# Patient Record
Sex: Female | Born: 1950 | Race: White | Hispanic: No | Smoking: Former smoker
Health system: Southern US, Community
[De-identification: ages and names within clinical notes are randomized; demographics above are authoritative.]

## PROBLEM LIST (undated history)

## (undated) DIAGNOSIS — M1990 Unspecified osteoarthritis, unspecified site: Secondary | ICD-10-CM

## (undated) DIAGNOSIS — J45909 Unspecified asthma, uncomplicated: Secondary | ICD-10-CM

## (undated) DIAGNOSIS — Z87442 Personal history of urinary calculi: Secondary | ICD-10-CM

## (undated) DIAGNOSIS — I700 Atherosclerosis of aorta: Secondary | ICD-10-CM

## (undated) DIAGNOSIS — J9601 Acute respiratory failure with hypoxia: Secondary | ICD-10-CM

## (undated) DIAGNOSIS — F32A Depression, unspecified: Secondary | ICD-10-CM

## (undated) DIAGNOSIS — J441 Chronic obstructive pulmonary disease with (acute) exacerbation: Secondary | ICD-10-CM

## (undated) DIAGNOSIS — F329 Major depressive disorder, single episode, unspecified: Secondary | ICD-10-CM

## (undated) DIAGNOSIS — C801 Malignant (primary) neoplasm, unspecified: Secondary | ICD-10-CM

## (undated) DIAGNOSIS — T1491XA Suicide attempt, initial encounter: Secondary | ICD-10-CM

## (undated) DIAGNOSIS — F419 Anxiety disorder, unspecified: Secondary | ICD-10-CM

## (undated) DIAGNOSIS — J449 Chronic obstructive pulmonary disease, unspecified: Secondary | ICD-10-CM

## (undated) HISTORY — PX: CATARACT EXTRACTION W/ INTRAOCULAR LENS  IMPLANT, BILATERAL: SHX1307

## (undated) HISTORY — PX: TUBAL LIGATION: SHX77

## (undated) HISTORY — PX: APPENDECTOMY: SHX54

## (undated) HISTORY — PX: DILATION AND CURETTAGE OF UTERUS: SHX78

## (undated) HISTORY — PX: TONSILLECTOMY: SUR1361

## (undated) HISTORY — PX: ABDOMINAL HYSTERECTOMY: SHX81

## (undated) HISTORY — PX: BREAST BIOPSY: SHX20

---

## 2008-07-07 ENCOUNTER — Other Ambulatory Visit: Admission: RE | Admit: 2008-07-07 | Discharge: 2008-07-07 | Payer: Self-pay | Admitting: Family Medicine

## 2008-09-29 ENCOUNTER — Encounter: Admission: RE | Admit: 2008-09-29 | Discharge: 2008-09-29 | Payer: Self-pay | Admitting: Family Medicine

## 2012-07-12 ENCOUNTER — Encounter (HOSPITAL_COMMUNITY): Payer: Self-pay | Admitting: Emergency Medicine

## 2012-07-12 ENCOUNTER — Emergency Department (HOSPITAL_COMMUNITY)
Admission: EM | Admit: 2012-07-12 | Discharge: 2012-07-12 | Disposition: A | Discharge status: Home / Self Care | Payer: BC Managed Care – PPO | Attending: Emergency Medicine | Admitting: Emergency Medicine

## 2012-07-12 ENCOUNTER — Emergency Department (HOSPITAL_COMMUNITY): Payer: BC Managed Care – PPO

## 2012-07-12 DIAGNOSIS — Z79899 Other long term (current) drug therapy: Secondary | ICD-10-CM | POA: Insufficient documentation

## 2012-07-12 DIAGNOSIS — R918 Other nonspecific abnormal finding of lung field: Secondary | ICD-10-CM

## 2012-07-12 DIAGNOSIS — N200 Calculus of kidney: Secondary | ICD-10-CM | POA: Insufficient documentation

## 2012-07-12 DIAGNOSIS — Z90710 Acquired absence of both cervix and uterus: Secondary | ICD-10-CM | POA: Insufficient documentation

## 2012-07-12 DIAGNOSIS — F17200 Nicotine dependence, unspecified, uncomplicated: Secondary | ICD-10-CM | POA: Insufficient documentation

## 2012-07-12 DIAGNOSIS — R911 Solitary pulmonary nodule: Secondary | ICD-10-CM | POA: Insufficient documentation

## 2012-07-12 DIAGNOSIS — R109 Unspecified abdominal pain: Secondary | ICD-10-CM | POA: Insufficient documentation

## 2012-07-12 DIAGNOSIS — N390 Urinary tract infection, site not specified: Secondary | ICD-10-CM | POA: Insufficient documentation

## 2012-07-12 DIAGNOSIS — Z7982 Long term (current) use of aspirin: Secondary | ICD-10-CM | POA: Insufficient documentation

## 2012-07-12 LAB — URINALYSIS, ROUTINE W REFLEX MICROSCOPIC
Bilirubin Urine: NEGATIVE
Glucose, UA: NEGATIVE mg/dL
Nitrite: NEGATIVE
Specific Gravity, Urine: 1.025 (ref 1.005–1.030)
pH: 5 (ref 5.0–8.0)

## 2012-07-12 LAB — URINE MICROSCOPIC-ADD ON

## 2012-07-12 LAB — POCT I-STAT, CHEM 8
Calcium, Ion: 1.21 mmol/L (ref 1.13–1.30)
Glucose, Bld: 80 mg/dL (ref 70–99)
HCT: 37 % (ref 36.0–46.0)
Hemoglobin: 12.6 g/dL (ref 12.0–15.0)
Potassium: 3.8 mEq/L (ref 3.5–5.1)

## 2012-07-12 MED ORDER — CEPHALEXIN 500 MG PO CAPS: 500.0000 mg | ORAL_CAPSULE | Freq: Four times a day (QID) | ORAL | Status: DC

## 2012-07-12 MED ORDER — KETOROLAC TROMETHAMINE 30 MG/ML IJ SOLN
30.0000 mg | Freq: Once | INTRAMUSCULAR | Status: AC
  Administered 2012-07-12: 30 mg via INTRAVENOUS
  Filled 2012-07-12: qty 1

## 2012-07-12 MED ORDER — SODIUM CHLORIDE 0.9 % IV BOLUS (SEPSIS)
1000.0000 mL | Freq: Once | INTRAVENOUS | Status: AC
  Administered 2012-07-12: 1000 mL via INTRAVENOUS

## 2012-07-12 MED ORDER — IBUPROFEN 600 MG PO TABS: 600.0000 mg | ORAL_TABLET | Freq: Four times a day (QID) | ORAL | Status: DC | PRN

## 2012-07-12 NOTE — ED Provider Notes (Addendum)
History     CSN: [id number]  Arrival date & time 07/12/12  1807   First MD Initiated Contact with Patient 07/12/12 1829      Chief Complaint  Patient presents with  . Flank Pain    (Consider location/radiation/quality/duration/timing/severity/associated sxs/prior treatment) HPI Patient with waxing and waning left flank pain for two days.  Worse today.  Pain at 5/10 now but has been 10/10.  No similar episodes in past.  No known history of trauma, dysuria, frequency, kidney stones.  No worsening or relieving factors.  Took aspirin without relief.    History reviewed. No pertinent past medical history.    Past Surgical History  Procedure Laterality Date  . Appendectomy    hysterectomy Tonsillectomy cataracts  History reviewed. No pertinent family history.  History  Substance Use Topics  . Smoking status: Current Every Day Smoker  . Smokeless tobacco: Not on file  . Alcohol Use: No    OB History   Grav Para Term Preterm Abortions TAB SAB Ect Mult Living                  Review of Systems  All other systems reviewed and are negative.    Allergies  Demerol and Oxycodone-acetaminophen  Home Medications   Current Outpatient Rx  Name  Route  Sig  Dispense  Refill  . aspirin 325 MG buffered tablet   Oral   Take 325 mg by mouth daily.         . Cranberry 250 MG TABS   Oral   Take 2 tablets by mouth daily.           BP 128/67  Pulse 99  Temp(Src) 98 F (36.7 C) (Oral)  Resp 16  SpO2 97%  Physical Exam  Nursing note and vitals reviewed. Constitutional: She appears well-developed and well-nourished.  HENT:  Head: Normocephalic and atraumatic.  Eyes: Conjunctivae and EOM are normal. Pupils are equal, round, and reactive to light.  Neck: Normal range of motion. Neck supple.  Cardiovascular: Normal rate, regular rhythm, normal heart sounds and intact distal pulses.   Pulmonary/Chest: Effort normal and breath sounds normal.  Abdominal: Soft. Bowel  sounds are normal.  Musculoskeletal: Normal range of motion.  Neurological: She is alert.  Skin: Skin is warm and dry.  Psychiatric: She has a normal mood and affect. Thought content normal.    ED Course  Procedures (including critical care time)  Labs Reviewed  URINALYSIS, ROUTINE W REFLEX MICROSCOPIC   No results found.   No diagnosis found.    MDM    62 year old female with left flank pain. Results for orders placed during the hospital encounter of 07/12/12  URINALYSIS, ROUTINE W REFLEX MICROSCOPIC      Result Value Range   Color, Urine YELLOW  YELLOW   APPearance CLEAR  CLEAR   Specific Gravity, Urine 1.025  1.005 - 1.030   pH 5.0  5.0 - 8.0   Glucose, UA NEGATIVE  NEGATIVE mg/dL   Hgb urine dipstick NEGATIVE  NEGATIVE   Bilirubin Urine NEGATIVE  NEGATIVE   Ketones, ur NEGATIVE  NEGATIVE mg/dL   Protein, ur NEGATIVE  NEGATIVE mg/dL   Urobilinogen, UA 1.0  0.0 - 1.0 mg/dL   Nitrite NEGATIVE  NEGATIVE   Leukocytes, UA MODERATE (*) NEGATIVE  URINE MICROSCOPIC-ADD ON      Result Value Range   Squamous Epithelial / LPF RARE  RARE   WBC, UA 3-6  <3 WBC/hpf   Bacteria, UA RARE  RARE   Urine-Other MUCOUS PRESENT    POCT I-STAT, CHEM 8      Result Value Range   Sodium 144  135 - 145 mEq/L   Potassium 3.8  3.5 - 5.1 mEq/L   Chloride 109  96 - 112 mEq/L   BUN 22  6 - 23 mg/dL   Creatinine, Ser 4.09  0.50 - 1.10 mg/dL   Glucose, Bld 80  70 - 99 mg/dL   Calcium, Ion 8.11  9.14 - 1.30 mmol/L   TCO2 28  0 - 100 mmol/L   Hemoglobin 12.6  12.0 - 15.0 g/dL   HCT 78.2  95.6 - 21.3 %   Ct Abdomen Pelvis Wo Contrast  07/12/2012  *RADIOLOGY REPORT*  Clinical Data:  Left-sided flank pain for 3 days.  Prior appendectomy.  CT ABDOMEN AND PELVIS WITHOUT CONTRAST (CT UROGRAM)  Technique: Contiguous axial images of the abdomen and pelvis without oral or intravenous contrast were obtained.  Comparison: None  Findings:  Exam is limited for evaluation of entities other than urinary  tract calculi due to lack of oral or intravenous contrast.   Lung bases:  Mild centrilobular emphysema at the lung bases.  A subpleural 3 mm left lower lobe lung nodule on image 5/series 6. Pleural-based irregularity more medially on image 2/series 6 could represent an area of pleural thickening.  Normal heart size without pericardial or pleural effusion.  Abdomen/pelvis:  Low density left liver lobe focus on image 18/series 2 measures 6 mm and is likely a cyst.  Normal uninfused appearance of the spleen, stomach, pancreas, gallbladder, biliary tract, adrenal glands.  Normal right kidney.  3 mm upper pole left renal collecting system calculus.  No urinary tract obstruction.  Phlebolith left hemi pelvis, without hydroureter or ureteric stone.  Advanced aortic atherosclerosis. No retroperitoneal or retrocrural adenopathy.  The cecum is positioned within the mid right pelvis. Normal terminal ileum.  Appendix surgically absent. Normal small bowel without abdominal ascites.  No retroperitoneal or retrocrural adenopathy.  Normal urinary bladder.  Hysterectomy.  No adnexal mass.  No significant free fluid.  Bones/Musculoskeletal:  Mild osteopenia.  IMPRESSION:  1.  Left renal calculus, without urinary tract obstruction. 2.  No other explanation for left-sided pain. 3.  Centrilobular emphysema with at least one tiny left lung base nodule. Given risk factors for bronchogenic carcinoma, follow-up chest CT at 1 year is recommended.  This recommendation follows the consensus statement:  "Guidelines for Management of Small Pulmonary Nodules Detected on CT Scans:  A Statement from the Fleischner Society" as published in Radiology 2005; 237:395-400.  Available online at:  [email or web address].cz.   Original Report Authenticated By: Jeronimo Greaves, M.D.     62 year old female with flank pain for 2 days which has been waxing and waning in nature. She has left CVA tenderness on examination with no  abdominal pain. CT reveals 3 mm upper pole left renal collecting system calculus but no ureteral stone or calculus. Patient feels improved after Toradol. This may represent either a stone that was not seen and has passed or pain from UTI. She has moderate LE and 3-6 white blood cells on her urine. Patient advised regarding flank and abdominal pain to return if is not better in 1224 hours or is worse anytime. She'll be given Percocet for pain and Keflex for urinary tract infection.  Patient advised regarding left lung base nodule. Given her risk factors for bronchogenic carcinoma followup chest CT at 1 year is recommended and she is so  advised.     Hilario Quarry, MD 07/12/12 1958  Addendum patient has intolerance to narcotics and will be given/advised to take ibuprofen instead.  Hilario Quarry, MD 07/12/12 2000

## 2012-07-12 NOTE — ED Notes (Signed)
Pt states she has had L flank pain for past 3 days.  Denies any n/v/d or changes with urination.  No hx of kidney stones.

## 2016-02-18 ENCOUNTER — Other Ambulatory Visit: Payer: Self-pay | Admitting: Family Medicine

## 2016-02-18 DIAGNOSIS — Z1231 Encounter for screening mammogram for malignant neoplasm of breast: Secondary | ICD-10-CM

## 2016-02-18 DIAGNOSIS — E2839 Other primary ovarian failure: Secondary | ICD-10-CM

## 2016-03-02 ENCOUNTER — Ambulatory Visit
Admission: RE | Admit: 2016-03-02 | Discharge: 2016-03-02 | Disposition: A | Discharge status: Home / Self Care | Payer: Medicare Other | Source: Ambulatory Visit | Attending: Family Medicine | Admitting: Family Medicine

## 2016-03-02 ENCOUNTER — Other Ambulatory Visit: Payer: Self-pay | Admitting: Family Medicine

## 2016-03-02 DIAGNOSIS — Z1231 Encounter for screening mammogram for malignant neoplasm of breast: Secondary | ICD-10-CM

## 2016-03-02 DIAGNOSIS — E2839 Other primary ovarian failure: Secondary | ICD-10-CM

## 2016-09-13 DIAGNOSIS — R079 Chest pain, unspecified: Secondary | ICD-10-CM | POA: Diagnosis not present

## 2017-02-02 DIAGNOSIS — Z23 Encounter for immunization: Secondary | ICD-10-CM | POA: Diagnosis not present

## 2017-06-06 DIAGNOSIS — E559 Vitamin D deficiency, unspecified: Secondary | ICD-10-CM | POA: Diagnosis not present

## 2017-06-06 DIAGNOSIS — E7800 Pure hypercholesterolemia, unspecified: Secondary | ICD-10-CM | POA: Diagnosis not present

## 2017-06-06 DIAGNOSIS — Z7729 Contact with and (suspected ) exposure to other hazardous substances: Secondary | ICD-10-CM | POA: Diagnosis not present

## 2017-06-06 DIAGNOSIS — Z23 Encounter for immunization: Secondary | ICD-10-CM | POA: Diagnosis not present

## 2017-06-06 DIAGNOSIS — Z131 Encounter for screening for diabetes mellitus: Secondary | ICD-10-CM | POA: Diagnosis not present

## 2017-06-06 DIAGNOSIS — Z1211 Encounter for screening for malignant neoplasm of colon: Secondary | ICD-10-CM | POA: Diagnosis not present

## 2017-06-06 DIAGNOSIS — Z1231 Encounter for screening mammogram for malignant neoplasm of breast: Secondary | ICD-10-CM | POA: Diagnosis not present

## 2017-06-06 DIAGNOSIS — Z1159 Encounter for screening for other viral diseases: Secondary | ICD-10-CM | POA: Diagnosis not present

## 2017-06-06 DIAGNOSIS — Z0000 Encounter for general adult medical examination without abnormal findings: Secondary | ICD-10-CM | POA: Diagnosis not present

## 2017-06-29 ENCOUNTER — Other Ambulatory Visit: Payer: Self-pay | Admitting: Family Medicine

## 2017-06-29 DIAGNOSIS — Z1231 Encounter for screening mammogram for malignant neoplasm of breast: Secondary | ICD-10-CM

## 2017-07-13 DIAGNOSIS — B079 Viral wart, unspecified: Secondary | ICD-10-CM | POA: Diagnosis not present

## 2017-07-13 DIAGNOSIS — M79671 Pain in right foot: Secondary | ICD-10-CM | POA: Diagnosis not present

## 2017-07-13 DIAGNOSIS — M79672 Pain in left foot: Secondary | ICD-10-CM | POA: Diagnosis not present

## 2017-07-13 DIAGNOSIS — M21622 Bunionette of left foot: Secondary | ICD-10-CM | POA: Diagnosis not present

## 2017-07-13 DIAGNOSIS — M21621 Bunionette of right foot: Secondary | ICD-10-CM | POA: Diagnosis not present

## 2017-07-20 ENCOUNTER — Ambulatory Visit
Admission: RE | Admit: 2017-07-20 | Discharge: 2017-07-20 | Disposition: A | Discharge status: Home / Self Care | Payer: Medicare HMO | Source: Ambulatory Visit | Attending: Family Medicine | Admitting: Family Medicine

## 2017-07-20 DIAGNOSIS — Z1231 Encounter for screening mammogram for malignant neoplasm of breast: Secondary | ICD-10-CM | POA: Diagnosis not present

## 2017-07-27 DIAGNOSIS — M79672 Pain in left foot: Secondary | ICD-10-CM | POA: Diagnosis not present

## 2017-07-27 DIAGNOSIS — M79671 Pain in right foot: Secondary | ICD-10-CM | POA: Diagnosis not present

## 2017-07-27 DIAGNOSIS — B079 Viral wart, unspecified: Secondary | ICD-10-CM | POA: Diagnosis not present

## 2017-07-27 DIAGNOSIS — L600 Ingrowing nail: Secondary | ICD-10-CM | POA: Diagnosis not present

## 2017-08-10 DIAGNOSIS — M79671 Pain in right foot: Secondary | ICD-10-CM | POA: Diagnosis not present

## 2017-08-10 DIAGNOSIS — M79672 Pain in left foot: Secondary | ICD-10-CM | POA: Diagnosis not present

## 2017-08-10 DIAGNOSIS — B351 Tinea unguium: Secondary | ICD-10-CM | POA: Diagnosis not present

## 2017-09-07 DIAGNOSIS — M79674 Pain in right toe(s): Secondary | ICD-10-CM | POA: Diagnosis not present

## 2017-09-07 DIAGNOSIS — M79675 Pain in left toe(s): Secondary | ICD-10-CM | POA: Diagnosis not present

## 2017-09-14 DIAGNOSIS — J208 Acute bronchitis due to other specified organisms: Secondary | ICD-10-CM | POA: Diagnosis not present

## 2017-09-14 DIAGNOSIS — R05 Cough: Secondary | ICD-10-CM | POA: Diagnosis not present

## 2017-09-15 ENCOUNTER — Other Ambulatory Visit: Payer: Self-pay

## 2017-09-15 ENCOUNTER — Emergency Department (HOSPITAL_COMMUNITY): Payer: Medicare HMO

## 2017-09-15 ENCOUNTER — Encounter (HOSPITAL_COMMUNITY): Payer: Self-pay | Admitting: *Deleted

## 2017-09-15 ENCOUNTER — Observation Stay (HOSPITAL_COMMUNITY)
Admission: EM | Admit: 2017-09-15 | Discharge: 2017-09-16 | Disposition: A | Discharge status: Home / Self Care | Payer: Medicare HMO | Attending: Internal Medicine | Admitting: Internal Medicine

## 2017-09-15 DIAGNOSIS — Z8249 Family history of ischemic heart disease and other diseases of the circulatory system: Secondary | ICD-10-CM | POA: Diagnosis not present

## 2017-09-15 DIAGNOSIS — Z885 Allergy status to narcotic agent status: Secondary | ICD-10-CM | POA: Insufficient documentation

## 2017-09-15 DIAGNOSIS — J4489 Other specified chronic obstructive pulmonary disease: Secondary | ICD-10-CM | POA: Diagnosis present

## 2017-09-15 DIAGNOSIS — Z79899 Other long term (current) drug therapy: Secondary | ICD-10-CM | POA: Insufficient documentation

## 2017-09-15 DIAGNOSIS — Z90710 Acquired absence of both cervix and uterus: Secondary | ICD-10-CM | POA: Diagnosis not present

## 2017-09-15 DIAGNOSIS — J449 Chronic obstructive pulmonary disease, unspecified: Secondary | ICD-10-CM | POA: Diagnosis present

## 2017-09-15 DIAGNOSIS — Z803 Family history of malignant neoplasm of breast: Secondary | ICD-10-CM | POA: Diagnosis not present

## 2017-09-15 DIAGNOSIS — Z87891 Personal history of nicotine dependence: Secondary | ICD-10-CM | POA: Insufficient documentation

## 2017-09-15 DIAGNOSIS — R791 Abnormal coagulation profile: Secondary | ICD-10-CM | POA: Diagnosis not present

## 2017-09-15 DIAGNOSIS — D72829 Elevated white blood cell count, unspecified: Secondary | ICD-10-CM | POA: Insufficient documentation

## 2017-09-15 DIAGNOSIS — I700 Atherosclerosis of aorta: Secondary | ICD-10-CM | POA: Insufficient documentation

## 2017-09-15 DIAGNOSIS — Z820 Family history of epilepsy and other diseases of the nervous system: Secondary | ICD-10-CM | POA: Diagnosis not present

## 2017-09-15 DIAGNOSIS — F319 Bipolar disorder, unspecified: Secondary | ICD-10-CM | POA: Insufficient documentation

## 2017-09-15 DIAGNOSIS — E8889 Other specified metabolic disorders: Secondary | ICD-10-CM | POA: Insufficient documentation

## 2017-09-15 DIAGNOSIS — J441 Chronic obstructive pulmonary disease with (acute) exacerbation: Secondary | ICD-10-CM | POA: Diagnosis not present

## 2017-09-15 DIAGNOSIS — J189 Pneumonia, unspecified organism: Secondary | ICD-10-CM

## 2017-09-15 DIAGNOSIS — R0602 Shortness of breath: Secondary | ICD-10-CM | POA: Diagnosis not present

## 2017-09-15 HISTORY — DX: Chronic obstructive pulmonary disease, unspecified: J44.9

## 2017-09-15 HISTORY — DX: Other specified chronic obstructive pulmonary disease: J44.89

## 2017-09-15 LAB — COMPREHENSIVE METABOLIC PANEL
ALT: 27 U/L (ref 14–54)
AST: 27 U/L (ref 15–41)
Albumin: 4.4 g/dL (ref 3.5–5.0)
Alkaline Phosphatase: 132 U/L — ABNORMAL HIGH (ref 38–126)
Anion gap: 11 (ref 5–15)
BILIRUBIN TOTAL: 0.4 mg/dL (ref 0.3–1.2)
BUN: 16 mg/dL (ref 6–20)
CHLORIDE: 106 mmol/L (ref 101–111)
CO2: 25 mmol/L (ref 22–32)
CREATININE: 0.68 mg/dL (ref 0.44–1.00)
Calcium: 9 mg/dL (ref 8.9–10.3)
Glucose, Bld: 100 mg/dL — ABNORMAL HIGH (ref 65–99)
POTASSIUM: 4.1 mmol/L (ref 3.5–5.1)
Sodium: 142 mmol/L (ref 135–145)
Total Protein: 8.1 g/dL (ref 6.5–8.1)

## 2017-09-15 LAB — CBC WITH DIFFERENTIAL/PLATELET
BASOS ABS: 0.1 10*3/uL (ref 0.0–0.1)
Basophils Relative: 1 %
Eosinophils Absolute: 1.1 10*3/uL — ABNORMAL HIGH (ref 0.0–0.7)
Eosinophils Relative: 7 %
HEMATOCRIT: 43.3 % (ref 36.0–46.0)
Hemoglobin: 14.4 g/dL (ref 12.0–15.0)
LYMPHS ABS: 3 10*3/uL (ref 0.7–4.0)
LYMPHS PCT: 18 %
MCH: 30.6 pg (ref 26.0–34.0)
MCHC: 33.3 g/dL (ref 30.0–36.0)
MCV: 92.1 fL (ref 78.0–100.0)
MONO ABS: 0.8 10*3/uL (ref 0.1–1.0)
MONOS PCT: 5 %
Neutro Abs: 11.3 10*3/uL — ABNORMAL HIGH (ref 1.7–7.7)
Neutrophils Relative %: 69 %
PLATELETS: 297 10*3/uL (ref 150–400)
RBC: 4.7 MIL/uL (ref 3.87–5.11)
RDW: 13.1 % (ref 11.5–15.5)
WBC: 16.3 10*3/uL — ABNORMAL HIGH (ref 4.0–10.5)

## 2017-09-15 LAB — TROPONIN I

## 2017-09-15 LAB — D-DIMER, QUANTITATIVE (NOT AT ARMC): D DIMER QUANT: 0.89 ug{FEU}/mL — AB (ref 0.00–0.50)

## 2017-09-15 MED ORDER — POLYETHYLENE GLYCOL 3350 17 G PO PACK: 17.0000 g | PACK | Freq: Every day | ORAL | Status: DC | PRN

## 2017-09-15 MED ORDER — IPRATROPIUM-ALBUTEROL 0.5-2.5 (3) MG/3ML IN SOLN
3.0000 mL | Freq: Four times a day (QID) | RESPIRATORY_TRACT | Status: DC
  Administered 2017-09-15: 3 mL via RESPIRATORY_TRACT
  Filled 2017-09-15: qty 3

## 2017-09-15 MED ORDER — AZITHROMYCIN 250 MG PO TABS
500.0000 mg | ORAL_TABLET | Freq: Once | ORAL | Status: AC
  Administered 2017-09-15: 500 mg via ORAL
  Filled 2017-09-15: qty 2

## 2017-09-15 MED ORDER — ALBUTEROL SULFATE (2.5 MG/3ML) 0.083% IN NEBU
5.0000 mg | INHALATION_SOLUTION | Freq: Once | RESPIRATORY_TRACT | Status: AC
  Administered 2017-09-15: 5 mg via RESPIRATORY_TRACT
  Filled 2017-09-15: qty 6

## 2017-09-15 MED ORDER — ARFORMOTEROL TARTRATE 15 MCG/2ML IN NEBU
15.0000 ug | INHALATION_SOLUTION | Freq: Two times a day (BID) | RESPIRATORY_TRACT | Status: DC
  Administered 2017-09-15 – 2017-09-16 (×2): 15 ug via RESPIRATORY_TRACT
  Filled 2017-09-15 (×3): qty 2

## 2017-09-15 MED ORDER — VITAMIN D 1000 UNITS PO TABS
2000.0000 [IU] | ORAL_TABLET | Freq: Every day | ORAL | Status: DC
  Administered 2017-09-16: 2000 [IU] via ORAL
  Filled 2017-09-15: qty 2

## 2017-09-15 MED ORDER — ENOXAPARIN SODIUM 40 MG/0.4ML ~~LOC~~ SOLN
40.0000 mg | SUBCUTANEOUS | Status: DC
  Administered 2017-09-15: 40 mg via SUBCUTANEOUS
  Filled 2017-09-15: qty 0.4

## 2017-09-15 MED ORDER — ALENDRONATE SODIUM 70 MG PO TABS: 70.0000 mg | ORAL_TABLET | ORAL | Status: DC

## 2017-09-15 MED ORDER — ALBUTEROL (5 MG/ML) CONTINUOUS INHALATION SOLN
10.0000 mg/h | INHALATION_SOLUTION | RESPIRATORY_TRACT | Status: DC
  Administered 2017-09-15: 10 mg/h via RESPIRATORY_TRACT
  Filled 2017-09-15: qty 20

## 2017-09-15 MED ORDER — ALBUTEROL SULFATE (2.5 MG/3ML) 0.083% IN NEBU
2.5000 mg | INHALATION_SOLUTION | Freq: Once | RESPIRATORY_TRACT | Status: AC
  Administered 2017-09-15: 2.5 mg via RESPIRATORY_TRACT
  Filled 2017-09-15: qty 3

## 2017-09-15 MED ORDER — IPRATROPIUM-ALBUTEROL 0.5-2.5 (3) MG/3ML IN SOLN
3.0000 mL | RESPIRATORY_TRACT | Status: DC
  Filled 2017-09-15: qty 6

## 2017-09-15 MED ORDER — PREDNISONE 20 MG PO TABS
60.0000 mg | ORAL_TABLET | Freq: Every day | ORAL | Status: DC
  Administered 2017-09-15 – 2017-09-16 (×2): 60 mg via ORAL
  Filled 2017-09-15 (×2): qty 3

## 2017-09-15 MED ORDER — ACETAMINOPHEN 325 MG PO TABS
650.0000 mg | ORAL_TABLET | Freq: Four times a day (QID) | ORAL | Status: DC | PRN
  Administered 2017-09-15: 650 mg via ORAL
  Filled 2017-09-15: qty 2

## 2017-09-15 MED ORDER — BUPROPION HCL ER (SR) 100 MG PO TB12
100.0000 mg | ORAL_TABLET | Freq: Every day | ORAL | Status: DC
  Administered 2017-09-16: 100 mg via ORAL
  Filled 2017-09-15: qty 1

## 2017-09-15 MED ORDER — IOPAMIDOL (ISOVUE-370) INJECTION 76%
100.0000 mL | Freq: Once | INTRAVENOUS | Status: AC | PRN
  Administered 2017-09-15: 100 mL via INTRAVENOUS

## 2017-09-15 MED ORDER — IPRATROPIUM BROMIDE 0.02 % IN SOLN
0.5000 mg | Freq: Once | RESPIRATORY_TRACT | Status: AC
  Administered 2017-09-15: 0.5 mg via RESPIRATORY_TRACT
  Filled 2017-09-15: qty 2.5

## 2017-09-15 MED ORDER — PREDNISONE 20 MG PO TABS
60.0000 mg | ORAL_TABLET | Freq: Once | ORAL | Status: AC
  Administered 2017-09-15: 60 mg via ORAL
  Filled 2017-09-15: qty 3

## 2017-09-15 MED ORDER — TIOTROPIUM BROMIDE MONOHYDRATE 18 MCG IN CAPS
18.0000 ug | ORAL_CAPSULE | Freq: Every day | RESPIRATORY_TRACT | Status: DC
  Filled 2017-09-15: qty 5

## 2017-09-15 MED ORDER — IOPAMIDOL (ISOVUE-370) INJECTION 76%
INTRAVENOUS | Status: AC
  Filled 2017-09-15: qty 100

## 2017-09-15 MED ORDER — GUAIFENESIN ER 600 MG PO TB12
600.0000 mg | ORAL_TABLET | Freq: Two times a day (BID) | ORAL | Status: DC | PRN
  Administered 2017-09-15: 600 mg via ORAL
  Filled 2017-09-15: qty 1

## 2017-09-15 MED ORDER — AZITHROMYCIN 250 MG PO TABS
500.0000 mg | ORAL_TABLET | Freq: Every day | ORAL | Status: DC
  Administered 2017-09-16: 500 mg via ORAL
  Filled 2017-09-15: qty 2

## 2017-09-15 NOTE — H&P (Signed)
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 5/10 she went to sleep but woke up at 2 AM this morning and could not sleep until 8 PM because she was having continued productive cough  She does not usually use her inhalers only uses them as needed however has continuously been needing them for the past couple of days She has not been hospitalized in 40 years   ED Course: Patient was given large dose of albuterol 15 mg, ipratropium and work-up was performed  Review of Systems:   Negative for fever, visual changes, sore throat, rash, new muscle aches, chest pain, SOB, dysuria, bleeding, n/v/abdominal pain. - Bleeding, - chest pain, - melena, - dysuria, - rash  + + Jittery and tremulous  Past Medical History:  Diagnosis Date  . COPD (chronic obstructive pulmonary disease) (HCC)     Past Surgical History:  Procedure Laterality Date  . ABDOMINAL HYSTERECTOMY    . APPENDECTOMY    . BREAST BIOPSY    . BREAST EXCISIONAL BIOPSY    . TONSILLECTOMY       reports that she has quit smoking. She has never used smokeless tobacco. She reports that she does not drink alcohol or use drugs. Mobility: At baseline is independent Mother had cardiomyopathy and died from this Father had dementia She currently lives independently     Allergies  Allergen Reactions  . Demerol [Meperidine]     horrible reaction  . Oxycodone-Acetaminophen Other (See  Comments)    Upset stomach     Family History  Problem Relation Age of Onset  . Breast cancer Mother      Prior to Admission medications   Medication Sig Start Date End Date Taking? Authorizing Provider  alendronate (FOSAMAX) 70 MG tablet Take 70 mg by mouth once a week. On Wednesdays 07/11/17  Yes [provider]  buPROPion (WELLBUTRIN SR) 100 MG 12 hr tablet Take 100 mg by mouth daily.   Yes [provider]  Cholecalciferol (VITAMIN D) 2000 units tablet Take 2,000 Units by mouth daily.   Yes [provider]  guaiFENesin (MUCINEX) 600 MG 12 hr tablet Take 600 mg by mouth 2 (two) times daily as needed for cough.   Yes [provider]  naproxen sodium (ALEVE) 220 MG tablet Take 220 mg by mouth 2 (two) times daily as needed (pain).   Yes [provider]    Physical Exam:  Vitals:   09/15/17 1230 09/15/17 1300  BP: 118/66 116/68  Pulse: (!) 104 (!) 102  Resp: 18 (!) 21  Temp:    SpO2: 93% 98%     EOMI NCAT tremulous no lymphadenopathy no thyromegaly arcus senilis is present no icterus no pallor no JVD  Chest sounds are diminished posterolaterally however no active wheeze  S1-S2 no murmur rub or gallop sinus tachycardia on EKG and monitors  Abdomen is soft nontender nondistended no rebound or guarding  No lower extremity edema  No rash  Euthymic and congruent    Musculoskeletal intact  Extraocular movements intact power is 5/5 reflexes 2/3  Muscle bulk is intact   I have personally reviewed following labs and imaging studies  Labs:   White count 16.7, alk phos 132 glucose 100 rest of labs WNL   Imaging studies:   CXR + CT scan-CT scan shows inspissated mucus, no overt pneumonia however  Medical tests:   EKG independently reviewed: Sinus tachycardia PR interval 0.08 QTC 453 QRS axis 80  Test discussed with performing physician:  Gust in detail with Dr. Mesner plan of care  Decision to obtain old records:    None  Review and summation of old records:   No old records  Active Problems:   * No active hospital problems. *   Assessment/Plan COPD exacerbation-we will admit under the COPD guidelines and continue albuterol and ipratropium every 6 hourly In addition add Spiriva and Brovana Will give steroids with prednisone 60 mg and reassess in a.m. She will need prescriptions for all of the above medications and will need PCP follow-up  Bipolar-continue Wellbutrin 100 daily  Metabolic bone disease can continue her usual alendronate should be also on cholecalciferol and need this checked-refer to guidelines for bisphosphonate use  Nicotine habituation I counseled the patient she is pre-contemplative/contemplative but does not want to have a nicotine patch     Severity of Illness: The appropriate patient status for this patient is OBSERVATION. Observation status is judged to be reasonable and necessary in order to provide the required intensity of service to ensure the patient's safety. The patient's presenting symptoms, physical exam findings, and initial radiographic and laboratory data in the context of their medical condition is felt to place them at decreased risk for further clinical deterioration. Furthermore, it is anticipated that the patient will be medically stable for discharge from the hospital within 2 midnights of admission. The following factors support the patient status of observation.   " The patient's presenting symptoms include shortness of breath. " The physical exam findings include tachypnea jitteriness. " The initial radiographic and laboratory data are for rapid and speedy cover.      DVT prophylaxis: Lovenox Code Status: Full CODE STATUS Family Communication: Son is present at the bedside Consults called: None yet  Time spent: 45 minutes  , MD  [facility] Direct contact: [phone] --Via amion app OR  --[email or web address]; password [email or web address]   7PM-7AM contact night coverage as above  09/15/2017, 1:26 PM       

## 2017-09-15 NOTE — ED Triage Notes (Signed)
Tuesday SHOB with cold symptoms started, went to MD yesterday was told it is bronchitis a breathing treatment in office helped. Stated it was a virus. Pt given Medrol pack and Tessalon pearl Prescription which has not been filled.

## 2017-09-15 NOTE — ED Provider Notes (Signed)
Emergency Department Provider Note   I have reviewed the triage vital signs and the nursing notes.   HISTORY  Chief Complaint Shortness of Breath   HPI Lori Butler is a 67 y.o. female previous smoker with a history of COPD the presents to the emergency department today secondary to shortness of breath.  Patient has been having shortness of breath for a few days been progressively worsening she went saw her primary doctor yesterday where she was given a breathing treatment which seemed to help.  They sent her home with a prescription for Tessalon and steroids however she did not get that filled because she felt too sick to go to the pharmacy.  She woke up this morning in the symptoms were worse than they were yesterday so she comes here for evaluation.  She has had a productive cough but is just been clear sputum.  She also has not had any fevers or chest pain.  No leg swelling is worse than normal.  No recent surgeries or long car rides.  No nausea, vomiting, diaphoresis.  Never really been sick like this before. No other associated or modifying symptoms.    Past Medical History:  Diagnosis Date  . COPD (chronic obstructive pulmonary disease) [facility])     Patient Active Problem List   Diagnosis Date Noted  . COPD (chronic obstructive pulmonary disease) ([place]) 09/15/2017    Past Surgical History:  Procedure Laterality Date  . ABDOMINAL HYSTERECTOMY    . APPENDECTOMY    . BREAST BIOPSY    . BREAST EXCISIONAL BIOPSY    . TONSILLECTOMY      Current Outpatient Rx  . Order #: [id number] Class: Historical Med  . Order #: [id number] Class: Historical Med  . Order #: [id number] Class: Historical Med  . Order #: [id number] Class: Historical Med  . Order #: [id number] Class: Historical Med    Allergies Demerol [meperidine] and Oxycodone-acetaminophen  Family History  Problem Relation Age of Onset  . Breast cancer Mother     Social History Social History   Tobacco Use  . Smoking  status: Former Research scientist (life sciences)  . Smokeless tobacco: Never Used  Substance Use Topics  . Alcohol use: No  . Drug use: No    Review of Systems  All other systems negative except as documented in the HPI. All pertinent positives and negatives as reviewed in the HPI. ____________________________________________   PHYSICAL EXAM:  VITAL SIGNS: ED Triage Vitals  Enc Vitals Group     BP 09/15/17 0851 136/77     Pulse Rate 09/15/17 0851 (!) 102     Resp 09/15/17 0851 20     Temp 09/15/17 0851 97.6 F (36.4 C)     Temp Source 09/15/17 0851 Oral     SpO2 09/15/17 0851 93 %     Weight 09/15/17 0853 147 lb (66.7 kg)     Height 09/15/17 0853 5\' 5"  (1.651 m)     Head Circumference --      Peak Flow --      Pain Score 09/15/17 0852 0     Pain Loc --      Pain Edu? --      Excl. in [place]? --     Constitutional: Alert and oriented. Well appearing and in no acute distress. Eyes: Conjunctivae are normal. PERRL. EOMI. Head: Atraumatic. Nose: No congestion/rhinnorhea. Mouth/Throat: Mucous membranes are moist.  Oropharynx non-erythematous. Neck: No stridor.  No meningeal signs.   Cardiovascular: Normal rate, regular rhythm. Good peripheral circulation. Grossly normal heart  sounds.   Respiratory: tachypneic respiratory effort.  No retractions. Lungs diminished bilaterally with wheezing. Gastrointestinal: Soft and nontender. No distention.  usculoskeletal: No lower extremity tenderness nor edema. No gross deformities of extremities. Neurologic:  Normal speech and language. No gross focal neurologic deficits are appreciated.  Skin:  Skin is warm, dry and intact. No rash noted.   ____________________________________________   LABS (all labs ordered are listed, but only abnormal results are displayed)  Labs Reviewed  CBC WITH DIFFERENTIAL/PLATELET - Abnormal; Notable for the following components:      Result Value   WBC 16.3 (*)    Neutro Abs 11.3 (*)    Eosinophils Absolute 1.1 (*)    All other  components within normal limits  COMPREHENSIVE METABOLIC PANEL - Abnormal; Notable for the following components:   Glucose, Bld 100 (*)    Alkaline Phosphatase 132 (*)    All other components within normal limits  D-DIMER, QUANTITATIVE (NOT AT [facility]) - Abnormal; Notable for the following components:   D-Dimer, Quant 0.89 (*)    All other components within normal limits  TROPONIN I   ____________________________________________  EKG   EKG Interpretation  Date/Time:  Saturday Sep 15 2017 09:50:24 EDT Ventricular Rate:  103 PR Interval:    QRS Duration: 79 QT Interval:  346 QTC Calculation: 453 R Axis:   84 Text Interpretation:  Sinus tachycardia Right atrial enlargement Borderline right axis deviation Anteroseptal infarct, old No old tracing to compare Confirmed by Merrily Pew [phone]) on 09/15/2017 12:12:28 PM      ____________________________________________  RADIOLOGY  Dg Chest 2 View  Result Date: 09/15/2017 CLINICAL DATA:  Shortness of breath. Report sent episode of bronchitis. History of COPD. Former smoker. EXAM: CHEST - 2 VIEW COMPARISON:  09/16/2015; chest CT-earlier same day FINDINGS: Grossly unchanged cardiac silhouette and mediastinal contours. The lungs remain hyperexpanded with flattening of the diaphragms. Grossly symmetric biapical pleuroparenchymal thickening, unchanged. No new focal airspace opacities. No pleural effusion or pneumothorax. No evidence of edema. No acute osseus abnormalities IMPRESSION: Similar findings of lung hyperexpansion without superimposed acute cardiopulmonary disease. Electronically Signed   By: Sandi Mariscal M.D.   On: 09/15/2017 11:56   Ct Angio Chest Pe W And/or Wo Contrast  Result Date: 09/15/2017 CLINICAL DATA:  Shortness of breath and elevated D-dimer. EXAM: CT ANGIOGRAPHY CHEST WITH CONTRAST TECHNIQUE: Multidetector CT imaging of the chest was performed using the standard protocol during bolus administration of intravenous contrast.  Multiplanar CT image reconstructions and MIPs were obtained to evaluate the vascular anatomy. CONTRAST:  164mL ISOVUE-370 IOPAMIDOL (ISOVUE-370) INJECTION 76% COMPARISON:  Chest x-ray 09/15/2017. FINDINGS: Cardiovascular: Heart size is normal. No pericardial effusion. Pulmonary arteries are well opacified. There is no acute pulmonary embolus. Thoracic aorta is atherosclerotic. No associated aneurysm. Mediastinum/Nodes: The visualized portion of the thyroid gland has a normal appearance. Esophagus is normal in appearance. No significant mediastinal, hilar, or axillary adenopathy. Lungs/Pleura: There is moderate emphysema involving the lung apices. There are biapical pleuroparenchymal changes. There is inspissated mucus in the LEFT LOWER lobe bronchi. No suspicious pulmonary mass. No evidence for pulmonary edema. No consolidations or pleural effusions. Upper Abdomen: There is a 12 millimeter nodule in the LEFT hepatic lobe, stable since prior studies. Musculoskeletal: There is mild midthoracic degenerative change. Visualized osseous structures have a normal appearance. Review of the MIP images confirms the above findings. IMPRESSION: 1. Technically adequate exam showing no acute pulmonary embolus. 2. Aortic Atherosclerosis (ICD10-I70.0) and Emphysema (ICD10-J43.9). 3. Inspissated mucus in the LEFT LOWER  lobe bronchi. No suspicious pulmonary mass or consolidation. Electronically Signed   By: Nolon Nations M.D.   On: 09/15/2017 11:56    ____________________________________________   PROCEDURES  Procedure(s) performed:   Procedures   ____________________________________________   INITIAL IMPRESSION / ASSESSMENT AND PLAN / ED COURSE  Persistent tachypnea and diminished breath sounds even after >15 mg albuterol. Will admit for copd exacerbation.      Pertinent labs & imaging results that were available during my care of the patient were reviewed by me and considered in my medical decision making  (see chart for details).  ____________________________________________  FINAL CLINICAL IMPRESSION(S) / ED DIAGNOSES  Final diagnoses:  COPD exacerbation ([place])     MEDICATIONS GIVEN DURING THIS VISIT:  Medications  albuterol (PROVENTIL,VENTOLIN) solution continuous neb (10 mg/hr Nebulization New Bag/Given 09/15/17 0954)  ipratropium-albuterol (DUONEB) 0.5-2.5 (3) MG/3ML nebulizer solution 3 mL (has no administration in time range)  tiotropium (SPIRIVA) inhalation capsule 18 mcg (has no administration in time range)  arformoterol (BROVANA) nebulizer solution 15 mcg (has no administration in time range)  predniSONE (DELTASONE) tablet 60 mg (has no administration in time range)  albuterol (PROVENTIL) (2.5 MG/3ML) 0.083% nebulizer solution 5 mg (5 mg Nebulization Given 09/15/17 0912)  predniSONE (DELTASONE) tablet 60 mg (60 mg Oral Given 09/15/17 0932)  azithromycin (ZITHROMAX) tablet 500 mg (500 mg Oral Given 09/15/17 0932)  ipratropium (ATROVENT) nebulizer solution 0.5 mg (0.5 mg Nebulization Given 09/15/17 0955)  iopamidol (ISOVUE-370) 76 % injection 100 mL ( Intravenous Canceled Entry 09/15/17 1148)  albuterol (PROVENTIL) (2.5 MG/3ML) 0.083% nebulizer solution 2.5 mg (2.5 mg Nebulization Given 09/15/17 1232)     NEW OUTPATIENT MEDICATIONS STARTED DURING THIS VISIT:  Current Discharge Medication List      Note:  This note was prepared with assistance of Dragon voice recognition software. Occasional wrong-word or sound-a-like substitutions may have occurred due to the inherent limitations of voice recognition software.   Merrily Pew, MD 09/15/17 1620

## 2017-09-15 NOTE — ED Notes (Signed)
ED Provider at bedside. 

## 2017-09-15 NOTE — ED Notes (Signed)
Report given to RN Hinton Dyer on 4E for room 1416.

## 2017-09-15 NOTE — ED Notes (Signed)
Respiratory has been called and notified of Neb Treatment.

## 2017-09-15 NOTE — ED Notes (Signed)
ED TO INPATIENT HANDOFF REPORT  Name/Age/Gender Lori Butler 67 y.o. female  Code Status   Home/SNF/Other Home  Chief Complaint diff breathig  Level of Care/Admitting Diagnosis ED Disposition    ED Disposition Condition [facility] Area: [place] [100102]  Level of Care: Telemetry [5]  Admit to tele based on following criteria: Monitor QTC interval  Diagnosis: COPD (chronic obstructive pulmonary disease) [facility]) [[id number]]  Admitting Physician: Nita Sells [phone]  Attending Physician: Nita Sells [[id number]  PT Class (Do Not Modify): Observation [104]  PT Acc Code (Do Not Modify): Observation [10022]       Medical History Past Medical History:  Diagnosis Date  . COPD (chronic obstructive pulmonary disease) (HCC)     Allergies Allergies  Allergen Reactions  . Demerol [Meperidine]     horrible reaction  . Oxycodone-Acetaminophen Other (See Comments)    Upset stomach     IV Location/Drains/Wounds Patient Lines/Drains/Airways Status   Active Line/Drains/Airways    Name:   Placement date:   Placement time:   Site:   Days:   Peripheral IV 09/15/17 Left;Lateral Forearm   09/15/17    0943    Forearm   less than 1          Labs/Imaging Results for orders placed or performed during the hospital encounter of 09/15/17 (from the past 48 hour(s))  CBC with Differential     Status: Abnormal   Collection Time: 09/15/17  9:17 AM  Result Value Ref Range   WBC 16.3 (H) 4.0 - 10.5 K/uL   RBC 4.70 3.87 - 5.11 MIL/uL   Hemoglobin 14.4 12.0 - 15.0 g/dL   HCT 43.3 36.0 - 46.0 %   MCV 92.1 78.0 - 100.0 fL   MCH 30.6 26.0 - 34.0 pg   MCHC 33.3 30.0 - 36.0 g/dL   RDW 13.1 11.5 - 15.5 %   Platelets 297 150 - 400 K/uL   Neutrophils Relative % 69 %   Neutro Abs 11.3 (H) 1.7 - 7.7 K/uL   Lymphocytes Relative 18 %   Lymphs Abs 3.0 0.7 - 4.0 K/uL   Monocytes Relative 5 %   Monocytes Absolute 0.8 0.1 - 1.0 K/uL   Eosinophils  Relative 7 %   Eosinophils Absolute 1.1 (H) 0.0 - 0.7 K/uL   Basophils Relative 1 %   Basophils Absolute 0.1 0.0 - 0.1 K/uL    Comment: Performed at [facility] [place] [address], [place], [place] [address]  Comprehensive metabolic panel     Status: Abnormal   Collection Time: 09/15/17  9:17 AM  Result Value Ref Range   Sodium 142 135 - 145 mmol/L   Potassium 4.1 3.5 - 5.1 mmol/L   Chloride 106 101 - 111 mmol/L   CO2 25 22 - 32 mmol/L   Glucose, Bld 100 (H) 65 - 99 mg/dL   BUN 16 6 - 20 mg/dL   Creatinine, Ser 0.68 0.44 - 1.00 mg/dL   Calcium 9.0 8.9 - 10.3 mg/dL   Total Protein 8.1 6.5 - 8.1 g/dL   Albumin 4.4 3.5 - 5.0 g/dL   AST 27 15 - 41 U/L   ALT 27 14 - 54 U/L   Alkaline Phosphatase 132 (H) 38 - 126 U/L   Total Bilirubin 0.4 0.3 - 1.2 mg/dL   GFR calc non Af Amer >60 >60 mL/min   GFR calc Af Amer >60 >60 mL/min    Comment: (NOTE) The eGFR has been calculated  using the CKD EPI equation. This calculation has not been validated in all clinical situations. eGFR's persistently <60 mL/min signify possible Chronic Kidney Disease.    Anion gap 11 5 - 15    Comment: Performed at [facility], [place] [address], [place], [place] [address]  Troponin I     Status: None   Collection Time: 09/15/17  9:17 AM  Result Value Ref Range   Troponin I <0.03 <0.03 ng/mL    Comment: Performed at [facility], [place] [address] [place], [place] [address]  D-dimer, quantitative (not at [facility])     Status: Abnormal   Collection Time: 09/15/17  9:17 AM  Result Value Ref Range   D-Dimer, Quant 0.89 (H) 0.00 - 0.50 ug/mL-FEU    Comment: (NOTE) At the manufacturer cut-off of 0.50 ug/mL FEU, this assay has been documented to exclude PE with a sensitivity and negative predictive value of 97 to 99%.  At this time, this assay has not been approved by the FDA to exclude DVT/VTE. Results should be correlated with clinical presentation. Performed at  [facility], [place] [address] [place], [place] [address]    Dg Chest 2 View  Result Date: 09/15/2017 CLINICAL DATA:  Shortness of breath. Report sent episode of bronchitis. History of COPD. Former smoker. EXAM: CHEST - 2 VIEW COMPARISON:  09/16/2015; chest CT-earlier same day FINDINGS: Grossly unchanged cardiac silhouette and mediastinal contours. The lungs remain hyperexpanded with flattening of the diaphragms. Grossly symmetric biapical pleuroparenchymal thickening, unchanged. No new focal airspace opacities. No pleural effusion or pneumothorax. No evidence of edema. No acute osseus abnormalities IMPRESSION: Similar findings of lung hyperexpansion without superimposed acute cardiopulmonary disease. Electronically Signed   By: Sandi Mariscal M.D.   On: 09/15/2017 11:56   Ct Angio Chest Pe W And/or Wo Contrast  Result Date: 09/15/2017 CLINICAL DATA:  Shortness of breath and elevated D-dimer. EXAM: CT ANGIOGRAPHY CHEST WITH CONTRAST TECHNIQUE: Multidetector CT imaging of the chest was performed using the standard protocol during bolus administration of intravenous contrast. Multiplanar CT image reconstructions and MIPs were obtained to evaluate the vascular anatomy. CONTRAST:  151m ISOVUE-370 IOPAMIDOL (ISOVUE-370) INJECTION 76% COMPARISON:  Chest x-ray 09/15/2017. FINDINGS: Cardiovascular: Heart size is normal. No pericardial effusion. Pulmonary arteries are well opacified. There is no acute pulmonary embolus. Thoracic aorta is atherosclerotic. No associated aneurysm. Mediastinum/Nodes: The visualized portion of the thyroid gland has a normal appearance. Esophagus is normal in appearance. No significant mediastinal, hilar, or axillary adenopathy. Lungs/Pleura: There is moderate emphysema involving the lung apices. There are biapical pleuroparenchymal changes. There is inspissated mucus in the LEFT LOWER lobe bronchi. No suspicious pulmonary mass. No evidence for pulmonary edema. No  consolidations or pleural effusions. Upper Abdomen: There is a 12 millimeter nodule in the LEFT hepatic lobe, stable since prior studies. Musculoskeletal: There is mild midthoracic degenerative change. Visualized osseous structures have a normal appearance. Review of the MIP images confirms the above findings. IMPRESSION: 1. Technically adequate exam showing no acute pulmonary embolus. 2. Aortic Atherosclerosis (ICD10-I70.0) and Emphysema (ICD10-J43.9). 3. Inspissated mucus in the LEFT LOWER lobe bronchi. No suspicious pulmonary mass or consolidation. Electronically Signed   By: ENolon NationsM.D.   On: 09/15/2017 11:56    Pending Labs [employer](From admission, onward)   Start     Ordered   Signed and Held  CBC  (enoxaparin (LOVENOX)    CrCl >/= 30 ml/min)  Once,   R    Comments:  Baseline for enoxaparin therapy IF NOT ALREADY DRAWN.  Notify MD if PLT < 100 K.    Signed and Held   Signed and Held  Creatinine, serum  (enoxaparin (LOVENOX)    CrCl >/= 30 ml/min)  Once,   R    Comments:  Baseline for enoxaparin therapy IF NOT ALREADY DRAWN.    Signed and Held   Signed and Held  Creatinine, serum  (enoxaparin (LOVENOX)    CrCl >/= 30 ml/min)  Weekly,   R    Comments:  while on enoxaparin therapy    Signed and Held   Signed and Held  Comprehensive metabolic panel  Tomorrow morning,   R     Signed and Held   Signed and Held  CBC  Tomorrow morning,   R     Signed and Held   Signed and Held  Basic metabolic panel  Tomorrow morning,   R     Signed and Held      Vitals/Pain Today's Vitals   09/15/17 1230 09/15/17 1300 09/15/17 1430 09/15/17 1500  BP: 118/66 116/68 (!) 125/59 (!) 122/58  Pulse: (!) 104 (!) 102  (!) 101  Resp: 18 (!) '21 19 19  ' Temp:      TempSrc:      SpO2: 93% 98%  95%  Weight:      Height:      PainSc:        Isolation Precautions No active isolations  Medications Medications  albuterol (PROVENTIL,VENTOLIN) solution continuous neb (10 mg/hr Nebulization  New Bag/Given 09/15/17 0954)  ipratropium-albuterol (DUONEB) 0.5-2.5 (3) MG/3ML nebulizer solution 3 mL (has no administration in time range)  tiotropium (SPIRIVA) inhalation capsule 18 mcg (has no administration in time range)  arformoterol (BROVANA) nebulizer solution 15 mcg (has no administration in time range)  predniSONE (DELTASONE) tablet 60 mg (has no administration in time range)  albuterol (PROVENTIL) (2.5 MG/3ML) 0.083% nebulizer solution 5 mg (5 mg Nebulization Given 09/15/17 0912)  predniSONE (DELTASONE) tablet 60 mg (60 mg Oral Given 09/15/17 0932)  azithromycin (ZITHROMAX) tablet 500 mg (500 mg Oral Given 09/15/17 0932)  ipratropium (ATROVENT) nebulizer solution 0.5 mg (0.5 mg Nebulization Given 09/15/17 0955)  iopamidol (ISOVUE-370) 76 % injection 100 mL ( Intravenous Canceled Entry 09/15/17 1148)  albuterol (PROVENTIL) (2.5 MG/3ML) 0.083% nebulizer solution 2.5 mg (2.5 mg Nebulization Given 09/15/17 1232)    Mobility walks

## 2017-09-15 NOTE — ED Notes (Signed)
RT at bedside.

## 2017-09-15 NOTE — ED Notes (Signed)
Patient transported to CT 

## 2017-09-16 ENCOUNTER — Observation Stay (HOSPITAL_COMMUNITY): Payer: Medicare HMO

## 2017-09-16 DIAGNOSIS — J439 Emphysema, unspecified: Secondary | ICD-10-CM | POA: Diagnosis not present

## 2017-09-16 DIAGNOSIS — J40 Bronchitis, not specified as acute or chronic: Secondary | ICD-10-CM | POA: Diagnosis not present

## 2017-09-16 DIAGNOSIS — J209 Acute bronchitis, unspecified: Secondary | ICD-10-CM

## 2017-09-16 DIAGNOSIS — J440 Chronic obstructive pulmonary disease with acute lower respiratory infection: Secondary | ICD-10-CM | POA: Diagnosis not present

## 2017-09-16 DIAGNOSIS — J449 Chronic obstructive pulmonary disease, unspecified: Secondary | ICD-10-CM | POA: Diagnosis not present

## 2017-09-16 DIAGNOSIS — J441 Chronic obstructive pulmonary disease with (acute) exacerbation: Secondary | ICD-10-CM | POA: Diagnosis not present

## 2017-09-16 LAB — CBC
HEMATOCRIT: 36.6 % (ref 36.0–46.0)
HEMOGLOBIN: 12 g/dL (ref 12.0–15.0)
MCH: 29.9 pg (ref 26.0–34.0)
MCHC: 32.8 g/dL (ref 30.0–36.0)
MCV: 91 fL (ref 78.0–100.0)
Platelets: 285 10*3/uL (ref 150–400)
RBC: 4.02 MIL/uL (ref 3.87–5.11)
RDW: 13.4 % (ref 11.5–15.5)
WBC: 11.2 10*3/uL — AB (ref 4.0–10.5)

## 2017-09-16 LAB — COMPREHENSIVE METABOLIC PANEL
ALBUMIN: 3.8 g/dL (ref 3.5–5.0)
ALK PHOS: 107 U/L (ref 38–126)
ALT: 23 U/L (ref 14–54)
ANION GAP: 11 (ref 5–15)
AST: 21 U/L (ref 15–41)
BUN: 16 mg/dL (ref 6–20)
CALCIUM: 9 mg/dL (ref 8.9–10.3)
CO2: 23 mmol/L (ref 22–32)
Chloride: 107 mmol/L (ref 101–111)
Creatinine, Ser: 0.58 mg/dL (ref 0.44–1.00)
GLUCOSE: 106 mg/dL — AB (ref 65–99)
POTASSIUM: 4.5 mmol/L (ref 3.5–5.1)
Sodium: 141 mmol/L (ref 135–145)
TOTAL PROTEIN: 7 g/dL (ref 6.5–8.1)
Total Bilirubin: 0.5 mg/dL (ref 0.3–1.2)

## 2017-09-16 MED ORDER — IPRATROPIUM-ALBUTEROL 0.5-2.5 (3) MG/3ML IN SOLN
3.0000 mL | Freq: Three times a day (TID) | RESPIRATORY_TRACT | Status: DC
  Administered 2017-09-16: 3 mL via RESPIRATORY_TRACT
  Filled 2017-09-16: qty 3

## 2017-09-16 MED ORDER — GUAIFENESIN ER 600 MG PO TB12: 1200.0000 mg | ORAL_TABLET | Freq: Two times a day (BID) | ORAL | 0 refills | Status: DC

## 2017-09-16 MED ORDER — PREDNISONE 10 MG (21) PO TBPK: ORAL_TABLET | ORAL | 0 refills | Status: DC

## 2017-09-16 MED ORDER — ALBUTEROL SULFATE HFA 108 (90 BASE) MCG/ACT IN AERS: 2.0000 | INHALATION_SPRAY | Freq: Four times a day (QID) | RESPIRATORY_TRACT | 2 refills | Status: DC | PRN

## 2017-09-16 MED ORDER — FLUTICASONE-SALMETEROL 100-50 MCG/DOSE IN AEPB: 1.0000 | INHALATION_SPRAY | Freq: Two times a day (BID) | RESPIRATORY_TRACT | 11 refills | Status: DC

## 2017-09-16 MED ORDER — GUAIFENESIN ER 600 MG PO TB12
1200.0000 mg | ORAL_TABLET | Freq: Two times a day (BID) | ORAL | Status: DC
  Administered 2017-09-16: 1200 mg via ORAL
  Filled 2017-09-16: qty 2

## 2017-09-16 MED ORDER — ALBUTEROL SULFATE (2.5 MG/3ML) 0.083% IN NEBU: 2.5000 mg | INHALATION_SOLUTION | RESPIRATORY_TRACT | 12 refills | Status: DC | PRN

## 2017-09-16 MED ORDER — IPRATROPIUM-ALBUTEROL 0.5-2.5 (3) MG/3ML IN SOLN: 3.0000 mL | Freq: Four times a day (QID) | RESPIRATORY_TRACT | Status: DC

## 2017-09-16 MED ORDER — AZITHROMYCIN 250 MG PO TABS: 500.0000 mg | ORAL_TABLET | Freq: Every day | ORAL | 0 refills | Status: DC

## 2017-09-16 MED ORDER — ALBUTEROL SULFATE (2.5 MG/3ML) 0.083% IN NEBU: 2.5000 mg | INHALATION_SOLUTION | RESPIRATORY_TRACT | Status: DC | PRN

## 2017-09-16 MED ORDER — TIOTROPIUM BROMIDE MONOHYDRATE 18 MCG IN CAPS: 18.0000 ug | ORAL_CAPSULE | Freq: Every day | RESPIRATORY_TRACT | 11 refills | Status: DC

## 2017-09-16 NOTE — Care Management CC44 (Addendum)
Condition Code 44 Documentation Completed  Patient Details  Name: Lori Butler MRN: [id number] Date of Birth: 1950-05-30   Condition Code 44 given:  Yes Patient signature on Condition Code 44 notice:  Yes Documentation of 2 MD's agreement:  Yes Code 44 added to claim:  Yes  This letter was reviewed with the patient over the phone as I am covering [facility] remotely from [facility].  Pt verbalized understanding.  RN will deliver letter to patient.   Claudie Leach, RN 09/16/2017, 3:08 PM

## 2017-09-16 NOTE — Care Management (Signed)
DME nebulizer ordered from [facility].  RN aware.

## 2017-09-16 NOTE — Progress Notes (Signed)
Patient ambulated approximately 200 ft on room air and oxygen saturation range was 91-93%. Tolerated well. Eulas Post, RN

## 2017-09-16 NOTE — Discharge Summary (Signed)
Physician Discharge Summary  Lori Butler JJH:[id number] DOB: 06-19-1950 DOA: 09/15/2017  PCP: [name], MD  Admit date: 09/15/2017 Discharge date: 09/16/2017  Admitted From: Home Disposition: Home  Recommendations for Outpatient Follow-up:  1. Follow up with PCP in 1-2 weeks 2. Follow up with Pulmonary Clinic. I spoke with [name], NP who will be arranging follow up at D/C; Appointment scheduled for 09/20/17 with Dr. Elsworth Soho 3. Please obtain CMP/CBC, Mag, Phos in one week 4. Repeat CXR in 3-6 weeks 5. Please follow up on the following pending results:  Home Health: No Equipment/Devices: None   Discharge Condition: Stable  CODE STATUS: FULL CODE Diet recommendation:   Brief/Interim Summary: The patient is a 67 year old female with a PMH of Prior hysterectomy for ovarian abscess, cataract surgery, appendectomy, tonsillectomy, chronic smoker, Known h/o smoking 1 pack/day but has been smoking 5 cigarettes for the past year, previous URI, recently treated with steroids and Tessalon Perls but did not obtain any relief. 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 She does not usually use her inhalers only uses them as needed however has continuously been needing them for the past couple of days. Admitted for a COPD Exacerbation and improved with Treatment of Steroids, Nebs, and Abx. She ambulated the halls and did not desaturate. She was deemed medically stable to D/C and will follow up with PCP and Pulmonary as an outpatient for official PFT's. She was given smoking cessation counseling prior to D/C and states she is quitting.   Discharge Diagnoses:  Active Problems:   COPD (chronic obstructive pulmonary disease)  (HCC)  Acute COPD exacerbation/Acute Bronchitis  -Admit under the COPD guidelines and continue albuterol and ipratropium every 6 hourly -CTA Done and no Embolus but did have Emphysema and  Inspissated mucus in the LEFT LOWER lobe bronchi. No suspicious pulmonary mass or consolidation. -CXR showed Bronchitic changes and mild hyperinflation without acute infiltrate. Garlon Hatchet was added while hospitalized  -Started  Seroids with prednisone 60 mg and D/C'd on taper  -Given Azithromycin for D/C to complete 5 day Course -Given Home Nebulizer and PRN Albuterol Nebs -Started patient on Albuterol Inhaler, Spiriva, Advair  -Walk Screen Patient did not desaturate -Follow up with Pulmonary Dr. Elsworth Soho at D/C on 09/16/14 at 1100 AM  Depression/Bipolar -Continue Wellbutrin 818 daily  Metabolic Bone Disease  -Can continue her usual Alendronate and Cholecalciferol -Will need Close PCP Follow up for DEXA scan's  Tobacco Abuse/Nicotine Habituation  -Smoking Cessation Counseling given  -Patient Declines Nicotine Patch   Leukocytosis -Mild at 11.2 -Improved from 16.3 -C/w Azithromycin for COPD and continue to Monitor for S/Sx of Infection -Repeat CBC as an outpatient   Elevated D-Dimer -Patient's D-Dimer was 0.89 -CTA Chest showed Technically adequate exam showing no acute pulmonary embolus. Aortic Atherosclerosis  and Emphysema. Inspissated mucus in the LEFT LOWER lobe bronchi. No suspicious pulmonary mass or consolidation.  Discharge Instructions  Discharge Instructions    Call MD for:  difficulty breathing, headache or visual disturbances   Complete by:  As directed    Call MD for:  extreme fatigue   Complete by:  As directed    Call MD for:  hives  Complete by:  As directed    Call MD for:  persistant dizziness or light-headedness   Complete by:  As directed    Call MD for:  persistant nausea and vomiting   Complete by:  As directed    Call MD for:  redness, tenderness, or signs of  infection (pain, swelling, redness, odor or green/yellow discharge around incision site)   Complete by:  As directed    Call MD for:  severe uncontrolled pain   Complete by:  As directed    Call MD for:  temperature >100.4   Complete by:  As directed    Diet - low sodium heart healthy   Complete by:  As directed    Discharge instructions   Complete by:  As directed    Follow-up with primary care physician as well as pulmonology as an outpatient.  Pulmonology clinic will call you to schedule an appointment however number has been given to dress in case they do not.  Take all medications as prescribed.  If symptoms change or worsen please return to the emergency room for evaluation.   Increase activity slowly   Complete by:  As directed      Allergies as of 09/16/2017      Reactions   Demerol [meperidine]    horrible reaction   Oxycodone-acetaminophen Other (See Comments)   Upset stomach       Medication List    STOP taking these medications   cephALEXin 500 MG capsule Commonly known as:  KEFLEX   ibuprofen 600 MG tablet Commonly known as:  ADVIL,MOTRIN     TAKE these medications   albuterol (2.5 MG/3ML) 0.083% nebulizer solution Commonly known as:  PROVENTIL Take 3 mLs (2.5 mg total) by nebulization every 4 (four) hours as needed for wheezing or shortness of breath.   albuterol 108 (90 Base) MCG/ACT inhaler Commonly known as:  PROVENTIL HFA;VENTOLIN HFA Inhale 2 puffs into the lungs every 6 (six) hours as needed for wheezing or shortness of breath.   alendronate 70 MG tablet Commonly known as:  FOSAMAX Take 70 mg by mouth once a week. On Wednesdays   azithromycin 250 MG tablet Commonly known as:  ZITHROMAX Take 2 tablets (500 mg total) by mouth daily for 4 days.   buPROPion 100 MG 12 hr tablet Commonly known as:  WELLBUTRIN SR Take 100 mg by mouth daily.   Fluticasone-Salmeterol 100-50 MCG/DOSE Aepb Commonly known as:  ADVAIR DISKUS Inhale 1 puff into the lungs 2  (two) times daily.   guaiFENesin 600 MG 12 hr tablet Commonly known as:  MUCINEX Take 2 tablets (1,200 mg total) by mouth 2 (two) times daily. What changed:    how much to take  when to take this  reasons to take this   naproxen sodium 220 MG tablet Commonly known as:  ALEVE Take 220 mg by mouth 2 (two) times daily as needed (pain).   predniSONE 10 MG (21) Tbpk tablet Commonly known as:  STERAPRED UNI-PAK 21 TAB Take 6 pills a day 1, 5 pills day 2, 4 pills day 3, 3 pills day 4, 2 pills day 5, 1 pill day 6, and stop on day 7   tiotropium 18 MCG inhalation capsule Commonly known as:  SPIRIVA HANDIHALER Place 1 capsule (18 mcg total) into inhaler and inhale daily.   Vitamin D 2000 units tablet Take 2,000 Units by mouth daily.            Durable Medical Equipment  (  From admission, onward)        Start     Ordered   09/16/17 1358  For home use only DME Nebulizer/meds  Once    Question:  Patient needs a nebulizer to treat with the following condition  Answer:  COPD (chronic obstructive pulmonary disease) ([place])   09/16/17 1357     Follow-up Information    [name], MD. Call.   Specialty:  Family Medicine Why:  Follow up within 1 week of Discharge Contact information: [place] [address] [phone]        Melvenia Needles, NP. Call.   Specialty:  Pulmonary Disease Why:  Follow up with Pulmonary Clinic for COPD evaluation and medication adjustment and outpatient PFTs. Contact information: [address] [place] [address] [phone]          Allergies  Allergen Reactions  . Demerol [Meperidine]     horrible reaction  . Oxycodone-Acetaminophen Other (See Comments)    Upset stomach    Consultations:  None  Procedures/Studies: X-ray Chest Pa And Lateral  Result Date: 09/16/2017 CLINICAL DATA:  Bronchitis, COPD, former smoker EXAM: CHEST - 2 VIEW COMPARISON:  09/15/2017 FINDINGS: Normal heart size, mediastinal  contours, and pulmonary vascularity. Atherosclerotic calcification aorta. Bronchitic changes with mild hyperinflation and biapical scarring. No acute infiltrate, pleural effusion or pneumothorax. Bones demineralized. IMPRESSION: Bronchitic changes and mild hyperinflation without acute infiltrate. Electronically Signed   By: Lavonia Dana M.D.   On: 09/16/2017 12:25   Dg Chest 2 View  Result Date: 09/15/2017 CLINICAL DATA:  Shortness of breath. Report sent episode of bronchitis. History of COPD. Former smoker. EXAM: CHEST - 2 VIEW COMPARISON:  09/16/2015; chest CT-earlier same day FINDINGS: Grossly unchanged cardiac silhouette and mediastinal contours. The lungs remain hyperexpanded with flattening of the diaphragms. Grossly symmetric biapical pleuroparenchymal thickening, unchanged. No new focal airspace opacities. No pleural effusion or pneumothorax. No evidence of edema. No acute osseus abnormalities IMPRESSION: Similar findings of lung hyperexpansion without superimposed acute cardiopulmonary disease. Electronically Signed   By: Sandi Mariscal M.D.   On: 09/15/2017 11:56   Ct Angio Chest Pe W And/or Wo Contrast  Result Date: 09/15/2017 CLINICAL DATA:  Shortness of breath and elevated D-dimer. EXAM: CT ANGIOGRAPHY CHEST WITH CONTRAST TECHNIQUE: Multidetector CT imaging of the chest was performed using the standard protocol during bolus administration of intravenous contrast. Multiplanar CT image reconstructions and MIPs were obtained to evaluate the vascular anatomy. CONTRAST:  149mL ISOVUE-370 IOPAMIDOL (ISOVUE-370) INJECTION 76% COMPARISON:  Chest x-ray 09/15/2017. FINDINGS: Cardiovascular: Heart size is normal. No pericardial effusion. Pulmonary arteries are well opacified. There is no acute pulmonary embolus. Thoracic aorta is atherosclerotic. No associated aneurysm. Mediastinum/Nodes: The visualized portion of the thyroid gland has a normal appearance. Esophagus is normal in appearance. No significant  mediastinal, hilar, or axillary adenopathy. Lungs/Pleura: There is moderate emphysema involving the lung apices. There are biapical pleuroparenchymal changes. There is inspissated mucus in the LEFT LOWER lobe bronchi. No suspicious pulmonary mass. No evidence for pulmonary edema. No consolidations or pleural effusions. Upper Abdomen: There is a 12 millimeter nodule in the LEFT hepatic lobe, stable since prior studies. Musculoskeletal: There is mild midthoracic degenerative change. Visualized osseous structures have a normal appearance. Review of the MIP images confirms the above findings. IMPRESSION: 1. Technically adequate exam showing no acute pulmonary embolus. 2. Aortic Atherosclerosis (ICD10-I70.0) and Emphysema (ICD10-J43.9). 3. Inspissated mucus in the LEFT LOWER lobe bronchi. No suspicious pulmonary mass or consolidation. Electronically Signed  By: Nolon Nations M.D.   On: 09/15/2017 11:56    Subjective: Seen and examined at bedside and had some coughing that was slightly productive.  No chest pain or shortness of breath and states that she is feeling slightly better.  Is not wheezing currently.  Ambulating the halls without any distress and did not desaturate.  Ready to go home and she will follow-up with PCP and Pulmonology as an outpatient.  Discharge Exam: Vitals:   09/16/17 1458 09/16/17 1635  BP: (!) 120/58   Pulse: 90   Resp: 20   Temp: 98.2 F (36.8 C)   SpO2: 94% 91%   Vitals:   09/16/17 0920 09/16/17 0929 09/16/17 1458 09/16/17 1635  BP:   (!) 120/58   Pulse:   90   Resp:   20   Temp:   98.2 F (36.8 C)   TempSrc:   Oral   SpO2: 90% 90% 94% 91%  Weight:      Height:       General: Pt is alert, awake, not in acute distress Cardiovascular: RRR, S1/S2 +, no rubs, no gallops Respiratory: Diminished bilaterally, no wheezing, no rhonchi Abdominal: Soft, NT, ND, bowel sounds + Extremities: no edema, no cyanosis  The results of significant diagnostics from this  hospitalization (including imaging, microbiology, ancillary and laboratory) are listed below for reference.    Microbiology: No results found for this or any previous visit (from the past 240 hour(s)).   Labs: BNP (last 3 results) No results for input(s): BNP in the last 8760 hours. Basic Metabolic Panel: Recent Labs  Lab 09/15/17 0917 09/16/17 0534  NA 142 141  K 4.1 4.5  CL 106 107  CO2 25 23  GLUCOSE 100* 106*  BUN 16 16  CREATININE 0.68 0.58  CALCIUM 9.0 9.0   Liver Function Tests: Recent Labs  Lab 09/15/17 0917 09/16/17 0534  AST 27 21  ALT 27 23  ALKPHOS 132* 107  BILITOT 0.4 0.5  PROT 8.1 7.0  ALBUMIN 4.4 3.8   No results for input(s): LIPASE, AMYLASE in the last 168 hours. No results for input(s): AMMONIA in the last 168 hours. CBC: Recent Labs  Lab 09/15/17 0917 09/16/17 0534  WBC 16.3* 11.2*  NEUTROABS 11.3*  --   HGB 14.4 12.0  HCT 43.3 36.6  MCV 92.1 91.0  PLT 297 285   Cardiac Enzymes: Recent Labs  Lab 09/15/17 0917  TROPONINI <0.03   BNP: Invalid input(s): POCBNP CBG: No results for input(s): GLUCAP in the last 168 hours. D-Dimer Recent Labs    09/15/17 0917  DDIMER 0.89*   Hgb A1c No results for input(s): HGBA1C in the last 72 hours. Lipid Profile No results for input(s): CHOL, HDL, LDLCALC, TRIG, CHOLHDL, LDLDIRECT in the last 72 hours. Thyroid function studies No results for input(s): TSH, T4TOTAL, T3FREE, THYROIDAB in the last 72 hours.  Invalid input(s): FREET3 Anemia work up No results for input(s): VITAMINB12, FOLATE, FERRITIN, TIBC, IRON, RETICCTPCT in the last 72 hours. Urinalysis    Component Value Date/Time   COLORURINE YELLOW 07/12/2012 1842   APPEARANCEUR CLEAR 07/12/2012 1842   LABSPEC 1.025 07/12/2012 1842   PHURINE 5.0 07/12/2012 1842   GLUCOSEU NEGATIVE 07/12/2012 1842   HGBUR NEGATIVE 07/12/2012 1842   BILIRUBINUR NEGATIVE 07/12/2012 1842   KETONESUR NEGATIVE 07/12/2012 1842   PROTEINUR NEGATIVE  07/12/2012 1842   UROBILINOGEN 1.0 07/12/2012 1842   NITRITE NEGATIVE 07/12/2012 1842   LEUKOCYTESUR MODERATE (A) 07/12/2012 1842   Sepsis  Labs Invalid input(s): PROCALCITONIN,  WBC,  LACTICIDVEN Microbiology No results found for this or any previous visit (from the past 240 hour(s)).  Time coordinating discharge: 35 minutes  SIGNED:  Kerney Elbe, DO [facility] 09/16/2017, 7:28 PM Pager [phone]  If 7PM-7AM, please contact night-coverage [email or web address] Password [email or web address]

## 2017-09-17 ENCOUNTER — Telehealth: Payer: Self-pay | Admitting: *Deleted

## 2017-09-17 NOTE — Telephone Encounter (Signed)
Called spoke with patient Consult appt scheduled with Dr Elsworth Soho for 5.16.19 @ 1100 Pt aware to arrive early, bring insurance cards, and all meds  Physical address and telephone number provided to patient Nothing further needed at this time; will sign off

## 2017-09-17 NOTE — Telephone Encounter (Signed)
-----   Message from Melvenia Needles, NP sent at 09/16/2017  2:24 PM EDT ----- Pt needs ov with Pulmonary MD for PUlmonary consult  She was discharged 5/12, not seen by [place] .  Will need new consult slot.   thanx  Tam

## 2017-09-19 DIAGNOSIS — I700 Atherosclerosis of aorta: Secondary | ICD-10-CM | POA: Diagnosis not present

## 2017-09-19 DIAGNOSIS — M859 Disorder of bone density and structure, unspecified: Secondary | ICD-10-CM | POA: Diagnosis not present

## 2017-09-19 DIAGNOSIS — J209 Acute bronchitis, unspecified: Secondary | ICD-10-CM | POA: Diagnosis not present

## 2017-09-19 DIAGNOSIS — J449 Chronic obstructive pulmonary disease, unspecified: Secondary | ICD-10-CM | POA: Diagnosis not present

## 2017-09-19 DIAGNOSIS — Z6823 Body mass index (BMI) 23.0-23.9, adult: Secondary | ICD-10-CM | POA: Diagnosis not present

## 2017-09-19 DIAGNOSIS — Z87891 Personal history of nicotine dependence: Secondary | ICD-10-CM | POA: Diagnosis not present

## 2017-09-20 ENCOUNTER — Ambulatory Visit: Payer: Medicare HMO | Admitting: Pulmonary Disease

## 2017-09-20 ENCOUNTER — Encounter: Payer: Self-pay | Admitting: Pulmonary Disease

## 2017-09-20 DIAGNOSIS — J441 Chronic obstructive pulmonary disease with (acute) exacerbation: Secondary | ICD-10-CM

## 2017-09-20 DIAGNOSIS — Z720 Tobacco use: Secondary | ICD-10-CM

## 2017-09-20 HISTORY — DX: Tobacco use: Z72.0

## 2017-09-20 NOTE — Progress Notes (Signed)
Subjective:    Patient ID: Lori Butler, female    DOB: 1950-12-18, 67 y.o.   MRN: [id number]  HPI  67 year old smoker admitted 5/11 for 1 day for COPD exacerbation, treated with steroids nebulizers and antibiotics with improvement.  She presents to establish care today. She works as a Scientist, water quality in [employer]. She smoked about a pack per day for 53 years and then cut down to 5 cigarettes/day over the past year and has finally been able to quit on 1 May.  She clearly had URI symptoms followed by dry cough And then wheezing and breathing got worse over a day requiring ED visit.  She now feels about 60% improved.  Albuterol makes her jittery.  She was also given Advair and Spiriva which she is using.  Cough is now dry, she is still taking Mucinex twice daily.  She was taking Wellbutrin and still has an occasional urge to smoke.  We personally reviewed CT images in detail. Family history of breast cancer in mother and sister had lung and colon cancer  Significant tests/ events reviewed  CT angio 09/2017 >> Emphysema and Inspissated mucus in the LEFT LOWER lobe bronchi, mild fibrotic changes upper lobes   Past Medical History:  Diagnosis Date  . COPD (chronic obstructive pulmonary disease) ([place])      Past Surgical History:  Procedure Laterality Date  . ABDOMINAL HYSTERECTOMY    . APPENDECTOMY    . BREAST BIOPSY    . BREAST EXCISIONAL BIOPSY    . TONSILLECTOMY      Allergies  Allergen Reactions  . Demerol [Meperidine]     horrible reaction  . Oxycodone-Acetaminophen Other (See Comments)    Upset stomach     Social History   Socioeconomic History  . Marital status: Single    Spouse name: Not on file  . Number of children: Not on file  . Years of education: Not on file  . Highest education level: Not on file  Occupational History  . Not on file  Social Needs  . Financial resource strain: Not on file  . Food insecurity:    Worry: Not on file    Inability: Not on file  .  Transportation needs:    Medical: Not on file    Non-medical: Not on file  Tobacco Use  . Smoking status: Former Research scientist (life sciences)  . Smokeless tobacco: Never Used  Substance and Sexual Activity  . Alcohol use: No  . Drug use: No  . Sexual activity: Not on file  Lifestyle  . Physical activity:    Days per week: Not on file    Minutes per session: Not on file  . Stress: Not on file  Relationships  . Social connections:    Talks on phone: Not on file    Gets together: Not on file    Attends religious service: Not on file    Active member of club or organization: Not on file    Attends meetings of clubs or organizations: Not on file    Relationship status: Not on file  . Intimate partner violence:    Fear of current or ex partner: Not on file    Emotionally abused: Not on file    Physically abused: Not on file    Forced sexual activity: Not on file  Other Topics Concern  . Not on file  Social History Narrative  . Not on file     Family History  Problem Relation Age of Onset  . Breast  cancer Mother      Review of Systems  Constitutional: Positive for unexpected weight change. Negative for fever.  HENT: Positive for congestion. Negative for dental problem, ear pain, nosebleeds, postnasal drip, rhinorrhea, sinus pressure, sneezing, sore throat and trouble swallowing.   Eyes: Negative for redness and itching.  Respiratory: Positive for cough and shortness of breath. Negative for chest tightness and wheezing.   Cardiovascular: Negative for palpitations and leg swelling.  Gastrointestinal: Negative for nausea and vomiting.  Genitourinary: Negative for dysuria.  Musculoskeletal: Negative for joint swelling.  Skin: Negative for rash.  Allergic/Immunologic: Negative.  Negative for environmental allergies, food allergies and immunocompromised state.  Neurological: Negative for headaches.  Hematological: Does not bruise/bleed easily.  Psychiatric/Behavioral: Negative for dysphoric mood.  The patient is not nervous/anxious.        Objective:   Physical Exam  Gen. Pleasant, well-nourished, in no distress, normal affect ENT - no thrush, no post nasal drip Neck: No JVD, no thyromegaly, no carotid bruits Lungs: no use of accessory muscles, no dullness to percussion, decreased BL  without rales or rhonchi  Cardiovascular: Rhythm regular, heart sounds  normal, no murmurs or gallops, no peripheral edema Abdomen: soft and non-tender, no hepatosplenomegaly, BS normal. Musculoskeletal: No deformities, no cyanosis or clubbing Neuro:  alert, non focal        Assessment & Plan:

## 2017-09-20 NOTE — Patient Instructions (Signed)
Congratulations on quitting smoking!  Stay on Advair and Spiriva. Okay to use Mucinex as needed. Okay to use albuterol as needed for wheezing  Breathing test on next visit

## 2017-09-20 NOTE — Assessment & Plan Note (Signed)
Counseled about smoking cessation. High risk for relapse.  Continue Wellbutrin

## 2017-09-20 NOTE — Assessment & Plan Note (Signed)
Current exacerbation is resolving Stay on Advair and Spiriva. Okay to use Mucinex as needed. Okay to use albuterol as needed for wheezing  PFTs on next visit -based on this, we will decide about the need for long-term medicines

## 2017-10-17 DIAGNOSIS — J449 Chronic obstructive pulmonary disease, unspecified: Secondary | ICD-10-CM | POA: Diagnosis not present

## 2017-10-26 ENCOUNTER — Encounter: Payer: Self-pay | Admitting: Pulmonary Disease

## 2017-10-26 ENCOUNTER — Ambulatory Visit (INDEPENDENT_AMBULATORY_CARE_PROVIDER_SITE_OTHER): Payer: Medicare HMO | Admitting: Pulmonary Disease

## 2017-10-26 ENCOUNTER — Ambulatory Visit: Payer: Medicare HMO | Admitting: Pulmonary Disease

## 2017-10-26 DIAGNOSIS — J441 Chronic obstructive pulmonary disease with (acute) exacerbation: Secondary | ICD-10-CM | POA: Diagnosis not present

## 2017-10-26 DIAGNOSIS — J449 Chronic obstructive pulmonary disease, unspecified: Secondary | ICD-10-CM

## 2017-10-26 DIAGNOSIS — Z720 Tobacco use: Secondary | ICD-10-CM

## 2017-10-26 LAB — PULMONARY FUNCTION TEST
DL/VA % pred: 73 %
DL/VA: 3.63 ml/min/mmHg/L
DLCO unc % pred: 57 %
DLCO unc: 14.73 ml/min/mmHg
FEF 25-75 Post: 0.65 L/sec
FEF 25-75 Pre: 0.59 L/sec
FEF2575-%CHANGE-POST: 10 %
FEF2575-%Pred-Post: 31 %
FEF2575-%Pred-Pre: 28 %
FEV1-%Change-Post: 3 %
FEV1-%PRED-POST: 53 %
FEV1-%PRED-PRE: 51 %
FEV1-POST: 1.28 L
FEV1-PRE: 1.24 L
FEV1FVC-%CHANGE-POST: 6 %
FEV1FVC-%Pred-Pre: 77 %
FEV6-%CHANGE-POST: -1 %
FEV6-%PRED-PRE: 67 %
FEV6-%Pred-Post: 66 %
FEV6-POST: 2.04 L
FEV6-PRE: 2.06 L
FEV6FVC-%Change-Post: 1 %
FEV6FVC-%PRED-POST: 104 %
FEV6FVC-%PRED-PRE: 102 %
FVC-%CHANGE-POST: -2 %
FVC-%PRED-PRE: 65 %
FVC-%Pred-Post: 64 %
FVC-POST: 2.04 L
FVC-PRE: 2.09 L
PRE FEV1/FVC RATIO: 59 %
Post FEV1/FVC ratio: 63 %
Post FEV6/FVC ratio: 100 %
Pre FEV6/FVC Ratio: 98 %
RV % PRED: 141 %
RV: 3.11 L
TLC % PRED: 101 %
TLC: 5.3 L

## 2017-10-26 MED ORDER — UMECLIDINIUM-VILANTEROL 62.5-25 MCG/INH IN AEPB: 1.0000 | INHALATION_SPRAY | Freq: Every day | RESPIRATORY_TRACT | 0 refills | Status: DC

## 2017-10-26 NOTE — Patient Instructions (Addendum)
You have moderate degree of COPD-lung function is at 53%.  Trial of ANORO once daily instead of Advair and Spiriva  Check with your insurance if this is covered and call us back for prescription in 2 weeks if you do not have any side effects  Congratulations on quitting smoking! Stay active. You will qualify for a pulmonary rehab program in the future

## 2017-10-26 NOTE — Assessment & Plan Note (Signed)
moderate degree of COPD-lung function is at 53%.  Trial of ANORO once daily instead of Advair and Spiriva  Check with your insurance if this is covered and call us back for prescription in 2 weeks if you do not have any side effects

## 2017-10-26 NOTE — Progress Notes (Signed)
PFT completed today.  

## 2017-10-26 NOTE — Progress Notes (Signed)
   Subjective:    Patient ID: Stevan Born, female    DOB: [birth year], 67 y.o.   MRN: [id number]  HPI  67 yo smoker for FU of moderate COPD  admitted 5/11 for 1 day for COPD exacerbation, treated with steroids nebulizers and antibiotics with improvement.   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  She feels about 95% improved.  She has gained 10 pounds since she quit smoking.  Has weaned herself off Wellbutrin now, still has the urge sometimes. Breathing is back to baseline, she does not need to use her albuterol at all, this makes her jittery PFTs were reviewed today   Significant tests/ events reviewed  CT angio 09/2017 >> Emphysema andInspissated mucus in the LEFT LOWER lobe bronchi, mild fibrotic changes upper lobes  PFTs 10/2017 -moderate airway obstruction, ratio 59, FEV1 51%, FVC 65%, no significant zygomatic response, DLCO 57%  Review of Systems Patient denies significant dyspnea,cough, hemoptysis,  chest pain, palpitations, pedal edema, orthopnea, paroxysmal nocturnal dyspnea, lightheadedness, nausea, vomiting, abdominal or  leg pains      Objective:   Physical Exam   Gen. Pleasant, well-nourished, in no distress ENT - no thrush, no post nasal drip Neck: No JVD, no thyromegaly, no carotid bruits Lungs: no use of accessory muscles, no dullness to percussion, clear without rales or rhonchi  Cardiovascular: Rhythm regular, heart sounds  normal, no murmurs or gallops, no peripheral edema Musculoskeletal: No deformities, no cyanosis or clubbing         Assessment & Plan:

## 2017-10-26 NOTE — Assessment & Plan Note (Signed)
Remains at high risk for relapse. We discussed increasing physical activity, she will qualify for pulmonary rehab program.  okay to stop Wellbutrin at this time

## 2017-10-26 NOTE — Addendum Note (Signed)
Addended by: Jannette Spanner on: 10/26/2017 11:31 AM   Modules accepted: Orders

## 2017-10-29 ENCOUNTER — Observation Stay (HOSPITAL_COMMUNITY): Payer: Medicare HMO

## 2017-10-29 ENCOUNTER — Observation Stay (HOSPITAL_COMMUNITY)
Admission: EM | Admit: 2017-10-29 | Discharge: 2017-11-02 | Disposition: A | Discharge status: Home / Self Care | Payer: Medicare HMO | Attending: Internal Medicine | Admitting: Internal Medicine

## 2017-10-29 DIAGNOSIS — Z885 Allergy status to narcotic agent status: Secondary | ICD-10-CM | POA: Insufficient documentation

## 2017-10-29 DIAGNOSIS — R000 Tachycardia, unspecified: Secondary | ICD-10-CM | POA: Diagnosis not present

## 2017-10-29 DIAGNOSIS — R0689 Other abnormalities of breathing: Secondary | ICD-10-CM | POA: Diagnosis not present

## 2017-10-29 DIAGNOSIS — G9341 Metabolic encephalopathy: Principal | ICD-10-CM | POA: Insufficient documentation

## 2017-10-29 DIAGNOSIS — Z9842 Cataract extraction status, left eye: Secondary | ICD-10-CM | POA: Insufficient documentation

## 2017-10-29 DIAGNOSIS — J449 Chronic obstructive pulmonary disease, unspecified: Secondary | ICD-10-CM | POA: Diagnosis present

## 2017-10-29 DIAGNOSIS — D72829 Elevated white blood cell count, unspecified: Secondary | ICD-10-CM | POA: Diagnosis not present

## 2017-10-29 DIAGNOSIS — Z7951 Long term (current) use of inhaled steroids: Secondary | ICD-10-CM | POA: Insufficient documentation

## 2017-10-29 DIAGNOSIS — R4182 Altered mental status, unspecified: Secondary | ICD-10-CM | POA: Diagnosis not present

## 2017-10-29 DIAGNOSIS — Z79899 Other long term (current) drug therapy: Secondary | ICD-10-CM | POA: Diagnosis not present

## 2017-10-29 DIAGNOSIS — Z961 Presence of intraocular lens: Secondary | ICD-10-CM | POA: Insufficient documentation

## 2017-10-29 DIAGNOSIS — R410 Disorientation, unspecified: Secondary | ICD-10-CM | POA: Diagnosis not present

## 2017-10-29 DIAGNOSIS — Z9841 Cataract extraction status, right eye: Secondary | ICD-10-CM | POA: Diagnosis not present

## 2017-10-29 DIAGNOSIS — F329 Major depressive disorder, single episode, unspecified: Secondary | ICD-10-CM | POA: Diagnosis not present

## 2017-10-29 DIAGNOSIS — R6510 Systemic inflammatory response syndrome (SIRS) of non-infectious origin without acute organ dysfunction: Secondary | ICD-10-CM | POA: Diagnosis present

## 2017-10-29 DIAGNOSIS — J4489 Other specified chronic obstructive pulmonary disease: Secondary | ICD-10-CM | POA: Diagnosis present

## 2017-10-29 DIAGNOSIS — Z87891 Personal history of nicotine dependence: Secondary | ICD-10-CM | POA: Insufficient documentation

## 2017-10-29 DIAGNOSIS — J439 Emphysema, unspecified: Secondary | ICD-10-CM | POA: Insufficient documentation

## 2017-10-29 DIAGNOSIS — R0602 Shortness of breath: Secondary | ICD-10-CM | POA: Diagnosis not present

## 2017-10-29 DIAGNOSIS — G9340 Encephalopathy, unspecified: Secondary | ICD-10-CM | POA: Diagnosis present

## 2017-10-29 DIAGNOSIS — Z87442 Personal history of urinary calculi: Secondary | ICD-10-CM | POA: Insufficient documentation

## 2017-10-29 DIAGNOSIS — E538 Deficiency of other specified B group vitamins: Secondary | ICD-10-CM | POA: Diagnosis not present

## 2017-10-29 HISTORY — DX: Personal history of urinary calculi: Z87.442

## 2017-10-29 HISTORY — DX: Unspecified osteoarthritis, unspecified site: M19.90

## 2017-10-29 LAB — ACETAMINOPHEN LEVEL: Acetaminophen (Tylenol), Serum: <10 ug/mL — ABNORMAL LOW (ref 10–30)

## 2017-10-29 LAB — COMPREHENSIVE METABOLIC PANEL
ALT: 46 U/L (ref 14–54)
AST: 35 U/L (ref 15–41)
Albumin: 4.1 g/dL (ref 3.5–5.0)
Alkaline Phosphatase: 138 U/L — ABNORMAL HIGH (ref 38–126)
Anion gap: 15 (ref 5–15)
BUN: 15 mg/dL (ref 6–20)
CO2: 22 mmol/L (ref 22–32)
Calcium: 8.9 mg/dL (ref 8.9–10.3)
Chloride: 103 mmol/L (ref 101–111)
Creatinine, Ser: 0.86 mg/dL (ref 0.44–1.00)
GFR calc Af Amer: >60 mL/min (ref >60)
GFR calc non Af Amer: >60 mL/min (ref >60)
Glucose, Bld: 136 mg/dL — ABNORMAL HIGH (ref 65–99)
Potassium: 3.8 mmol/L (ref 3.5–5.1)
Sodium: 140 mmol/L (ref 135–145)
Total Bilirubin: 3.4 mg/dL — ABNORMAL HIGH (ref 0.3–1.2)
Total Protein: 7.3 g/dL (ref 6.5–8.1)

## 2017-10-29 LAB — CBC WITH DIFFERENTIAL/PLATELET
Abs Immature Granulocytes: 0.1 10*3/uL (ref 0.0–0.1)
Basophils Absolute: 0.1 10*3/uL (ref 0.0–0.1)
Basophils Relative: 0 %
Eosinophils Absolute: 0 10*3/uL (ref 0.0–0.7)
Eosinophils Relative: 0 %
HCT: 40 % (ref 36.0–46.0)
Hemoglobin: 13.1 g/dL (ref 12.0–15.0)
Immature Granulocytes: 0 %
Lymphocytes Relative: 8 %
Lymphs Abs: 1.2 10*3/uL (ref 0.7–4.0)
MCH: 29.6 pg (ref 26.0–34.0)
MCHC: 32.8 g/dL (ref 30.0–36.0)
MCV: 90.3 fL (ref 78.0–100.0)
Monocytes Absolute: 0.5 10*3/uL (ref 0.1–1.0)
Monocytes Relative: 3 %
Neutro Abs: 13.6 10*3/uL — ABNORMAL HIGH (ref 1.7–7.7)
Neutrophils Relative %: 89 %
Platelets: 305 10*3/uL (ref 150–400)
RBC: 4.43 MIL/uL (ref 3.87–5.11)
RDW: 13.2 % (ref 11.5–15.5)
WBC: 15.4 10*3/uL — ABNORMAL HIGH (ref 4.0–10.5)

## 2017-10-29 LAB — SALICYLATE LEVEL: Salicylate Lvl: <7 mg/dL (ref 2.8–30.0)

## 2017-10-29 LAB — I-STAT CG4 LACTIC ACID, ED: Lactic Acid, Venous: 1.42 mmol/L (ref 0.5–1.9)

## 2017-10-29 LAB — MAGNESIUM: Magnesium: 1.9 mg/dL (ref 1.7–2.4)

## 2017-10-29 LAB — CK: Total CK: 200 U/L (ref 38–234)

## 2017-10-29 MED ORDER — LORAZEPAM 2 MG/ML IJ SOLN
1.0000 mg | Freq: Once | INTRAMUSCULAR | Status: AC
  Administered 2017-10-29: 1 mg via INTRAVENOUS
  Filled 2017-10-29: qty 1

## 2017-10-29 NOTE — ED Provider Notes (Signed)
[facility] EMERGENCY DEPARTMENT Provider Note   CSN: [id number] Arrival date & time: 10/29/17  1819     History   Chief Complaint Chief Complaint  Patient presents with  . Altered Mental Status    HPI Lori Butler is a 67 y.o. female.  HPI   67 year old female with a change of mental status.  Noted by family apparently.  Unclear onset.  Patient keeps talking about her medications but she cannot try me with specifics specifics in terms of possible inappropriate ingestion.  She denies any acute trauma.  No acute pain.  She does feel that she is not her normal self but is having a hard time elucidating beyond this.  Past Medical History:  Diagnosis Date  . COPD (chronic obstructive pulmonary disease) [facility])     Patient Active Problem List   Diagnosis Date Noted  . Tobacco abuse 09/20/2017  . COPD (chronic obstructive pulmonary disease) ([place]) 09/15/2017    Past Surgical History:  Procedure Laterality Date  . ABDOMINAL HYSTERECTOMY    . APPENDECTOMY    . BREAST BIOPSY    . BREAST EXCISIONAL BIOPSY    . TONSILLECTOMY       OB History   None      Home Medications    Prior to Admission medications   Medication Sig Start Date End Date Taking? Authorizing Provider  albuterol (PROVENTIL HFA;VENTOLIN HFA) 108 (90 Base) MCG/ACT inhaler Inhale 2 puffs into the lungs every 6 (six) hours as needed for wheezing or shortness of breath. 09/16/17   Sheikh, Lori Quint Latif, DO  albuterol (PROVENTIL) (2.5 MG/3ML) 0.083% nebulizer solution Take 3 mLs (2.5 mg total) by nebulization every 4 (four) hours as needed for wheezing or shortness of breath. 09/16/17   Raiford Noble Latif, DO  alendronate (FOSAMAX) 70 MG tablet Take 70 mg by mouth once a week. On Wednesdays 07/11/17   [provider]  Fluticasone-Salmeterol (ADVAIR DISKUS) 100-50 MCG/DOSE AEPB Inhale 1 puff into the lungs 2 (two) times daily. 09/16/17 09/16/18  Raiford Noble Latif, DO  guaiFENesin (MUCINEX)  600 MG 12 hr tablet Take 2 tablets (1,200 mg total) by mouth 2 (two) times daily. 09/16/17   Raiford Noble Latif, DO  naproxen sodium (ALEVE) 220 MG tablet Take 220 mg by mouth 2 (two) times daily as needed (pain).    [provider]  tiotropium (SPIRIVA HANDIHALER) 18 MCG inhalation capsule Place 1 capsule (18 mcg total) into inhaler and inhale daily. 09/16/17 09/16/18  Sheikh, Omair Latif, DO  umeclidinium-vilanterol (ANORO ELLIPTA) 62.5-25 MCG/INH AEPB Inhale 1 puff into the lungs daily. 10/26/17   Rigoberto Noel, MD  Vitamin D, Ergocalciferol, (DRISDOL) 50000 units CAPS capsule Take 50,000 Units by mouth every 7 (seven) days.    [provider]    Family History Family History  Problem Relation Age of Onset  . Breast cancer Mother     Social History Social History   Tobacco Use  . Smoking status: Former Smoker    Packs/day: 1.50    Years: 51.00    Pack years: 76.50    Types: Cigarettes    Last attempt to quit: 09/05/2017    Years since quitting: 0.1  . Smokeless tobacco: Never Used  Substance Use Topics  . Alcohol use: No  . Drug use: No     Allergies   Demerol [meperidine] and Oxycodone-acetaminophen   Review of Systems Review of Systems  Level 5 caveat because of confusion. Physical Exam Updated Vital Signs BP Marland Kitchen)  159/80   Pulse (!) 123   Resp (!) 28   SpO2 92%   Physical Exam  Constitutional: She appears well-developed and well-nourished. No distress.  HENT:  Head: Normocephalic and atraumatic.  Eyes: Conjunctivae are normal. Right eye exhibits no discharge. Left eye exhibits no discharge.  Neck: Neck supple.  Cardiovascular: Normal rate, regular rhythm and normal heart sounds. Exam reveals no gallop and no friction rub.  No murmur heard. Pulmonary/Chest: Effort normal and breath sounds normal. No respiratory distress.  Abdominal: Soft. She exhibits no distension. There is no tenderness.  Musculoskeletal: She exhibits no edema or tenderness.    Neurological:  Awake.  Staring off into space.  Will focus briefly.  Picking at her bed sheets.  No focal motor deficits.  Tone seems normal.  Hyperreflexic lower extremities.  Skin: Skin is warm and dry.  Psychiatric:  Odd affect.  Loses train of thought very easily.  Poor insight.  Nursing note and vitals reviewed.    ED Treatments / Results  Labs (all labs ordered are listed, but only abnormal results are displayed) Labs Reviewed  ACETAMINOPHEN LEVEL - Abnormal; Notable for the following components:      Result Value   Acetaminophen (Tylenol), Serum <10 (*)    All other components within normal limits  COMPREHENSIVE METABOLIC PANEL - Abnormal; Notable for the following components:   Glucose, Bld 136 (*)    Alkaline Phosphatase 138 (*)    Total Bilirubin 3.4 (*)    All other components within normal limits  CBC WITH DIFFERENTIAL/PLATELET - Abnormal; Notable for the following components:   WBC 15.4 (*)    Neutro Abs 13.6 (*)    All other components within normal limits  SALICYLATE LEVEL  MAGNESIUM  CK  I-STAT CG4 LACTIC ACID, ED    EKG None  Radiology No results found.  Procedures Procedures (including critical care time)  Medications Ordered in ED Medications - No data to display   Initial Impression / Assessment and Plan / ED Course  I have reviewed the triage vital signs and the nursing notes.  Pertinent labs & imaging results that were available during my care of the patient were reviewed by me and considered in my medical decision making (see chart for details).     67 year old female with altered mental status.  I suspect medication ingestion.  She is answering most questions appropriately but she has a short attention span and  is often staring off into space.  Her pupils are midposition and barely reactive.  Bladder scan showing 690 cc. She does appear somewhat flushed.  Symptoms seem most consistent with an anticholinergic toxidrome.  She is not febrile  though.  I cannot get a clear history as to tell what exactly she may have ingested.  She is tachycardic but her blood pressure is fine.  QRS is normal.  She does have a prolonged QTC though.  Acetaminophen and salicylate levels are undetectable.  Labs fairly unremarkable aside from a total bilirubin 3.4.  Will CT her head although this is probably low yield.  Benzos for agitation.  Will be admitted for ongoing observation on telemetry.  Final Clinical Impressions(s) / ED Diagnoses   Final diagnoses:  SIRS (systemic inflammatory response syndrome) [facility])    ED Discharge Orders    None       Virgel Manifold, MD 11/04/17 2357

## 2017-10-29 NOTE — ED Notes (Signed)
Patient transported to CT 

## 2017-10-29 NOTE — ED Triage Notes (Signed)
Pt arrives from home via [facility], per son pt was to pick up dog and he called matienence for welfare check, EMS was called, per EMS on arrival had anxiety and AMS on arrival, no weakness or drift, passed stroke screen, 18 in right wrist.  Of note, pt left work early yesterday for chest pain, oriented x3 (disorineted to situation) upon arrival, repeating that she took her medicine, particuarly tylenol, says that she kept taking tylenol because "they were telling me to take more," but doesn't know who

## 2017-10-30 ENCOUNTER — Other Ambulatory Visit: Payer: Self-pay

## 2017-10-30 ENCOUNTER — Observation Stay (HOSPITAL_COMMUNITY): Payer: Medicare HMO

## 2017-10-30 ENCOUNTER — Encounter (HOSPITAL_COMMUNITY): Payer: Self-pay | Admitting: Internal Medicine

## 2017-10-30 DIAGNOSIS — Z885 Allergy status to narcotic agent status: Secondary | ICD-10-CM | POA: Diagnosis not present

## 2017-10-30 DIAGNOSIS — G9341 Metabolic encephalopathy: Secondary | ICD-10-CM | POA: Diagnosis not present

## 2017-10-30 DIAGNOSIS — R6510 Systemic inflammatory response syndrome (SIRS) of non-infectious origin without acute organ dysfunction: Secondary | ICD-10-CM | POA: Diagnosis not present

## 2017-10-30 DIAGNOSIS — J439 Emphysema, unspecified: Secondary | ICD-10-CM | POA: Diagnosis not present

## 2017-10-30 DIAGNOSIS — R4182 Altered mental status, unspecified: Secondary | ICD-10-CM | POA: Diagnosis not present

## 2017-10-30 DIAGNOSIS — E538 Deficiency of other specified B group vitamins: Secondary | ICD-10-CM | POA: Diagnosis not present

## 2017-10-30 DIAGNOSIS — Z79899 Other long term (current) drug therapy: Secondary | ICD-10-CM | POA: Diagnosis not present

## 2017-10-30 DIAGNOSIS — Z7951 Long term (current) use of inhaled steroids: Secondary | ICD-10-CM | POA: Diagnosis not present

## 2017-10-30 DIAGNOSIS — G9340 Encephalopathy, unspecified: Secondary | ICD-10-CM

## 2017-10-30 DIAGNOSIS — F329 Major depressive disorder, single episode, unspecified: Secondary | ICD-10-CM | POA: Diagnosis not present

## 2017-10-30 DIAGNOSIS — Z87891 Personal history of nicotine dependence: Secondary | ICD-10-CM | POA: Diagnosis not present

## 2017-10-30 LAB — CBC WITH DIFFERENTIAL/PLATELET
Abs Immature Granulocytes: 0.1 10*3/uL (ref 0.0–0.1)
BASOS ABS: 0.1 10*3/uL (ref 0.0–0.1)
BASOS PCT: 0 %
Eosinophils Absolute: 0.2 10*3/uL (ref 0.0–0.7)
Eosinophils Relative: 1 %
HCT: 39.1 % (ref 36.0–46.0)
Hemoglobin: 12.6 g/dL (ref 12.0–15.0)
IMMATURE GRANULOCYTES: 0 %
Lymphocytes Relative: 13 %
Lymphs Abs: 2.2 10*3/uL (ref 0.7–4.0)
MCH: 29.6 pg (ref 26.0–34.0)
MCHC: 32.2 g/dL (ref 30.0–36.0)
MCV: 91.8 fL (ref 78.0–100.0)
Monocytes Absolute: 1.6 10*3/uL — ABNORMAL HIGH (ref 0.1–1.0)
Monocytes Relative: 9 %
NEUTROS ABS: 12.9 10*3/uL — AB (ref 1.7–7.7)
NEUTROS PCT: 77 %
PLATELETS: 264 10*3/uL (ref 150–400)
RBC: 4.26 MIL/uL (ref 3.87–5.11)
RDW: 13.4 % (ref 11.5–15.5)
WBC: 16.9 10*3/uL — ABNORMAL HIGH (ref 4.0–10.5)

## 2017-10-30 LAB — TROPONIN I
Troponin I: <0.03 ng/mL (ref <0.03)
Troponin I: <0.03 ng/mL (ref <0.03)

## 2017-10-30 LAB — URINALYSIS, ROUTINE W REFLEX MICROSCOPIC
BACTERIA UA: NONE SEEN
Bilirubin Urine: NEGATIVE
Glucose, UA: NEGATIVE mg/dL
Hgb urine dipstick: NEGATIVE
Ketones, ur: 20 mg/dL — AB
Nitrite: NEGATIVE
Protein, ur: NEGATIVE mg/dL
Specific Gravity, Urine: 1.026 (ref 1.005–1.030)
pH: 5 (ref 5.0–8.0)

## 2017-10-30 LAB — HEPATIC FUNCTION PANEL
ALBUMIN: 4.1 g/dL (ref 3.5–5.0)
ALK PHOS: 127 U/L — AB (ref 38–126)
ALT: 44 U/L (ref 0–44)
AST: 31 U/L (ref 15–41)
Bilirubin, Direct: 0.1 mg/dL (ref 0.0–0.2)
Indirect Bilirubin: 1.3 mg/dL — ABNORMAL HIGH (ref 0.3–0.9)
TOTAL PROTEIN: 6.9 g/dL (ref 6.5–8.1)
Total Bilirubin: 1.4 mg/dL — ABNORMAL HIGH (ref 0.3–1.2)

## 2017-10-30 LAB — RAPID URINE DRUG SCREEN, HOSP PERFORMED
AMPHETAMINES: NOT DETECTED
BENZODIAZEPINES: NOT DETECTED
Cocaine: NOT DETECTED
OPIATES: NOT DETECTED
Tetrahydrocannabinol: NOT DETECTED

## 2017-10-30 LAB — BASIC METABOLIC PANEL
ANION GAP: 13 (ref 5–15)
BUN: 20 mg/dL (ref 8–23)
CHLORIDE: 109 mmol/L (ref 98–111)
CO2: 21 mmol/L — ABNORMAL LOW (ref 22–32)
Calcium: 8.6 mg/dL — ABNORMAL LOW (ref 8.9–10.3)
Creatinine, Ser: 0.85 mg/dL (ref 0.44–1.00)
GFR calc non Af Amer: >60 mL/min (ref >60)
Glucose, Bld: 60 mg/dL — ABNORMAL LOW (ref 70–99)
POTASSIUM: 4.2 mmol/L (ref 3.5–5.1)
SODIUM: 143 mmol/L (ref 135–145)

## 2017-10-30 LAB — GLUCOSE, CAPILLARY
GLUCOSE-CAPILLARY: 48 mg/dL — AB (ref 70–99)
GLUCOSE-CAPILLARY: 89 mg/dL (ref 70–99)
Glucose-Capillary: 71 mg/dL (ref 70–99)
Glucose-Capillary: 55 mg/dL — ABNORMAL LOW (ref 70–99)
Glucose-Capillary: 108 mg/dL — ABNORMAL HIGH (ref 70–99)
Glucose-Capillary: 90 mg/dL (ref 70–99)

## 2017-10-30 LAB — LACTIC ACID, PLASMA
Lactic Acid, Venous: 1 mmol/L (ref 0.5–1.9)
Lactic Acid, Venous: 1.5 mmol/L (ref 0.5–1.9)

## 2017-10-30 LAB — AMMONIA: AMMONIA: 30 umol/L (ref 9–35)

## 2017-10-30 LAB — HIV ANTIBODY (ROUTINE TESTING W REFLEX): HIV Screen 4th Generation wRfx: NONREACTIVE

## 2017-10-30 LAB — MRSA PCR SCREENING: MRSA BY PCR: NEGATIVE

## 2017-10-30 LAB — TSH: TSH: 3.15 u[IU]/mL (ref 0.350–4.500)

## 2017-10-30 LAB — ACETAMINOPHEN LEVEL

## 2017-10-30 LAB — MAGNESIUM: MAGNESIUM: 2 mg/dL (ref 1.7–2.4)

## 2017-10-30 LAB — SALICYLATE LEVEL

## 2017-10-30 MED ORDER — LORAZEPAM 2 MG/ML IJ SOLN: 0.5000 mg | INTRAMUSCULAR | Status: DC | PRN

## 2017-10-30 MED ORDER — LEVALBUTEROL HCL 0.63 MG/3ML IN NEBU: 0.6300 mg | INHALATION_SOLUTION | Freq: Four times a day (QID) | RESPIRATORY_TRACT | Status: DC | PRN

## 2017-10-30 MED ORDER — SODIUM CHLORIDE 0.9 % IV SOLN
INTRAVENOUS | Status: DC
  Administered 2017-10-30: 01:00:00 via INTRAVENOUS

## 2017-10-30 MED ORDER — SODIUM CHLORIDE 0.9 % IV SOLN
1.0000 g | INTRAVENOUS | Status: DC
  Administered 2017-10-30 – 2017-11-01 (×3): 1 g via INTRAVENOUS
  Filled 2017-10-30 (×4): qty 10

## 2017-10-30 NOTE — Progress Notes (Signed)
PROGRESS NOTE                                                                                                                                                                                                             Patient Demographics:    Lori Butler, is a 67 y.o. female, DOB - October 07, 1950, RKY:[id number]  Admit date - 10/29/2017   Admitting Physician Rise Patience, MD  Outpatient Primary MD for the patient is [name], MD  LOS - 0   Chief Complaint  Patient presents with  . Altered Mental Status       Brief Narrative   This is  a no charge note as patient admitted earlier today  67 y.o. female with history of COPD and depression was brought to the ER after patient was found to be confused.      Subjective:    Lori Butler today denies any complaints .    Assessment  & Plan :    Principal Problem:   SIRS (systemic inflammatory response syndrome) ([facility]) Active Problems:   COPD (chronic obstructive pulmonary disease) ([facility])   Acute encephalopathy  Acute encephalopathy -Patient presents with confusion, no clear etiology, CT head with no acute findings, MRI brain pending, EEG is done, but reading still pending, ammonia and TSH within normal limit, I will send B15 and folic acid . -She had mildly positive urine analysis, I will start on Rocephin and send cultures. -There was questione about medication induced encephalopathy, questionable anticholinergic medication, but I do not see any anticholinergic medication she is taken, admitting physician discussed with poison center, who recommended close monitoring on QTC and QRS, continue with telemetry, and monitor electrolytes closely.  History of COPD -No wheezing  Leukocytosis -Has positive urine analysis, cannot tell me for sure if she has any urinary symptoms, I will start on Rocephin   Code Status : Full  Family Communication  : None at bedside  Disposition Plan  :  will need SNF per PT  Consults  :  None  Procedures  : None  DVT Prophylaxis  :  [place] heparin  Lab Results  Component Value Date   PLT 264 10/30/2017    Antibiotics  :    Anti-infectives (From admission, onward)   None        Objective:   Vitals:  10/30/17 0100 10/30/17 0500 10/30/17 0728 10/30/17 1152  BP:   132/74 134/73  Pulse: 96  92   Resp: 20  (!) 23   Temp:  98 F (36.7 C) 98.2 F (36.8 C) 98 F (36.7 C)  TempSrc:  Oral  Oral  SpO2: 100%  100%     Wt Readings from Last 3 Encounters:  10/26/17 68.5 kg (151 lb)  09/20/17 63.5 kg (140 lb)  09/15/17 66.7 kg (147 lb)     Intake/Output Summary (Last 24 hours) at 10/30/2017 1441 Last data filed at 10/30/2017 1300 Gross per 24 hour  Intake 1030.34 ml  Output 800 ml  Net 230.34 ml     Physical Exam  Awake Alert, Oriented X 2, but remains confused, distracted easily, Symmetrical Chest wall movement, Good air movement bilaterally, CTAB RRR,No Gallops,Rubs or new Murmurs, No Parasternal Heave +ve B.Sounds, Abd Soft, No tenderness,  No rebound - guarding or rigidity.  Move all extremities without gross deficits No Cyanosis, Clubbing or edema, No new Rash or bruise     Data Review:    CBC Recent Labs  Lab 10/29/17 1901 10/30/17 0230  WBC 15.4* 16.9*  HGB 13.1 12.6  HCT 40.0 39.1  PLT 305 264  MCV 90.3 91.8  MCH 29.6 29.6  MCHC 32.8 32.2  RDW 13.2 13.4  LYMPHSABS 1.2 2.2  MONOABS 0.5 1.6*  EOSABS 0.0 0.2  BASOSABS 0.1 0.1    Chemistries  Recent Labs  Lab 10/29/17 1901 10/30/17 0230  NA 140 143  K 3.8 4.2  CL 103 109  CO2 22 21*  GLUCOSE 136* 60*  BUN 15 20  CREATININE 0.86 0.85  CALCIUM 8.9 8.6*  MG 1.9 2.0  AST 35 31  ALT 46 44  ALKPHOS 138* 127*  BILITOT 3.4* 1.4*   ------------------------------------------------------------------------------------------------------------------ No results for input(s): CHOL, HDL, LDLCALC, TRIG, CHOLHDL, LDLDIRECT in the last 72 hours.  No  results found for: HGBA1C ------------------------------------------------------------------------------------------------------------------ Recent Labs    10/30/17 0230  TSH 3.150   ------------------------------------------------------------------------------------------------------------------ No results for input(s): VITAMINB12, FOLATE, FERRITIN, TIBC, IRON, RETICCTPCT in the last 72 hours.  Coagulation profile No results for input(s): INR, PROTIME in the last 168 hours.  No results for input(s): DDIMER in the last 72 hours.  Cardiac Enzymes Recent Labs  Lab 10/30/17 0230 10/30/17 0831  TROPONINI <0.03 <0.03   ------------------------------------------------------------------------------------------------------------------ No results found for: BNP  Inpatient Medications  Scheduled Meds: Continuous Infusions: . sodium chloride 100 mL/hr at 10/30/17 1155   PRN Meds:.levalbuterol, LORazepam  Micro Results Recent Results (from the past 240 hour(s))  MRSA PCR Screening     Status: None   Collection Time: 10/30/17  5:21 AM  Result Value Ref Range Status   MRSA by PCR NEGATIVE NEGATIVE Final    Comment:        The GeneXpert MRSA Assay (FDA approved for NASAL specimens only), is one component of a comprehensive MRSA colonization surveillance program. It is not intended to diagnose MRSA infection nor to guide or monitor treatment for MRSA infections. Performed at [facility], [place] [address], [place], [place] [address]     Radiology Reports Ct Head Wo Contrast  Result Date: 10/29/2017 CLINICAL DATA:  Altered mental status, anxiety, shortness of breath. EXAM: CT HEAD WITHOUT CONTRAST TECHNIQUE: Contiguous axial images were obtained from the base of the skull through the vertex without intravenous contrast. COMPARISON:  None. FINDINGS: Brain: Mild generalized volume loss with commensurate dilatation of the ventricles and sulci.  Incidental note is made of a  prominent perivascular space within the lower portion of the RIGHT basal ganglia. Questionable small old lacunar infarcts within the basal ganglia regions and pons. There is no mass, hemorrhage, edema or other evidence of acute parenchymal abnormality. No extra-axial hemorrhage. Vascular: There are chronic calcified atherosclerotic changes of the large vessels at the skull base. No unexpected hyperdense vessel. Skull: Normal. Negative for fracture or focal lesion. Sinuses/Orbits: No acute finding. Other: None. IMPRESSION: 1. No acute findings.  No intracranial mass, hemorrhage or edema. 2. Questionable small old lacunar infarcts within the basal ganglia regions and pons. Electronically Signed   By: Franki Cabot M.D.   On: 10/29/2017 21:41   Dg Chest Port 1 View  Result Date: 10/30/2017 CLINICAL DATA:  68 year old female with altered mental status and systemic inflammatory response syndrome. EXAM: PORTABLE CHEST 1 VIEW COMPARISON:  Chest radiograph dated 09/16/2017 FINDINGS: There is emphysematous changes of the lungs with areas of biapical subpleural scarring. No focal consolidation, pleural effusion, or pneumothorax. The cardiac silhouette is within normal limits. No acute osseous pathology. IMPRESSION: No acute cardiopulmonary process. Emphysema. Electronically Signed   By: Anner Crete M.D.   On: 10/30/2017 02:14    Time Spent in minutes  : Is no charge note this patient admitted earlier by Dr. Evalee Mutton M.D on 10/30/2017 at 2:41 PM  Between 7am to 7pm - Pager - [phone]  After 7pm go to [email or web address] - password [facility]  [facility] -  Office  [phone]

## 2017-10-30 NOTE — Clinical Social Work Note (Signed)
Clinical Social Work Assessment  Patient Details  Name: Lori Butler MRN: [id number] Date of Birth: 06-15-1950  Date of referral:  10/30/17               Reason for consult:  Facility Placement                Permission sought to share information with:  Facility Sport and exercise psychologist, Family Supports Permission granted to share information::  Yes, Verbal Permission Granted  Name::     Dance movement psychotherapist::  SNFs  Relationship::  Son  Contact Information:  [phone]  Housing/Transportation Living arrangements for the past 2 months:  Apartment Source of Information:  Patient, Adult Children Patient Interpreter Needed:  None Criminal Activity/Legal Involvement Pertinent to Current Situation/Hospitalization:  No - Comment as needed Significant Relationships:  Adult Children Lives with:  Self Do you feel safe going back to the place where you live?  No Need for family participation in patient care:  Yes (Comment)  Care giving concerns:  CSW received consult for possible SNF placement at time of discharge. CSW spoke with patient and her son at bedside with sitter present regarding PT recommendation of SNF placement at time of discharge. Patient reported that she has lived alone for 20 years and still works part time. CSW to continue to follow and assist with discharge planning needs.   Social Worker assessment / plan:  CSW spoke with patient concerning possibility of rehab at [facility] before returning home.  Employment status:  Retired Nurse, adult PT Recommendations:  [facility] / Referral to community resources:  [place]  Patient/Family's Response to care:  Patient reports wanting to return home at discharge, however, her son states that he would prefer her to go to SNF due to not being able to care for her. Patient acknowledges that her son has his own children to care for. She states they will discuss it and let CSW  know. CSW stressed the importance of providing a quick decision due to needing insurance pre-authorization, which can take up to three days in light of patient's observation status.   Patient/Family's Understanding of and Emotional Response to Diagnosis, Current Treatment, and Prognosis:  Patient/family is realistic regarding therapy needs and expressed being hopeful for return home. Patient expressed understanding of CSW role and discharge process as well as medical condition. No questions/concerns about plan or treatment.    Emotional Assessment Appearance:  Appears stated age Attitude/Demeanor/Rapport:  Self-Confident Affect (typically observed):  Accepting, Appropriate, Quiet, Guarded Orientation:  Oriented to Self, Oriented to Place Alcohol / Substance use:  Illicit Drugs Psych involvement (Current and /or in the community):  No (Comment)  Discharge Needs  Concerns to be addressed:  Care Coordination Readmission within the last 30 days:  No Current discharge risk:  Cognitively Impaired, Lives alone Barriers to Discharge:  Continued Medical Work up   [employer], [place] 10/30/2017, 4:56 PM

## 2017-10-30 NOTE — Evaluation (Signed)
Physical Therapy Evaluation Patient Details Name: Lori Butler  MRN: [id number] DOB: 1950-11-18 Today's Date: 10/30/2017   History of Present Illness  67 y.o. female with history of COPD and depression was brought to the ER after patient was found to be confused. Per MD,  SIRS/acute encephalopathy possibly medication related.  Clinical Impression  Orders received for PT evaluation. Patient demonstrates deficits in functional mobility as indicated below. Will benefit from continued skilled PT to address deficits and maximize function. Will see as indicated and progress as tolerated.  Patient extremely unsteady on her feet and continues to show some concerning confusion with basic tasks. At this time, feel patient will likely need SNF.   OF NOTE: HR elevated to 130s with limited activity    Follow Up Recommendations SNF;Supervision/Assistance - 24 hour    Equipment Recommendations  None recommended by PT    Recommendations for Other Services       Precautions / Restrictions Precautions Precautions: Fall      Mobility  Bed Mobility Overal bed mobility: Needs Assistance Bed Mobility: Supine to Sit;Sit to Supine     Supine to sit: Supervision Sit to supine: Supervision   General bed mobility comments: supervision for safety   Transfers Overall transfer level: Needs assistance Equipment used: 1 person hand held assist Transfers: Sit to/from Stand Sit to Stand: Min assist         General transfer comment: min assist for stability  Ambulation/Gait Ambulation/Gait assistance: Mod assist Gait Distance (Feet): 40 Feet Assistive device: 1 person hand held assist;Rolling walker (2 wheeled) Gait Pattern/deviations: Step-through pattern;Decreased stride length;Ataxic;Drifts right/left;Staggering right;Staggering left;Narrow base of support Gait velocity: decreased Gait velocity interpretation: <1.8 ft/sec, indicate of risk for recurrent falls General Gait Details: patient  extremely unsteady with ambulation, several noted LOB with difficulty maintaining balance. Decreased coordination during gait phases with noted fluctuations in stride and postural stability  Stairs            Wheelchair Mobility    Modified Rankin (Stroke Patients Only)       Balance Overall balance assessment: Needs assistance   Sitting balance-Leahy Scale: Good Sitting balance - Comments: able to sit self supported and reach outside BOS   Standing balance support: During functional activity Standing balance-Leahy Scale: Poor Standing balance comment: multiple balance checks and instability, hands on assist required                             Pertinent Vitals/Pain Pain Assessment: No/denies pain    Home Living Family/patient expects to be discharged to:: Private residence Living Arrangements: Alone   Type of Home: Apartment Home Access: Stairs to enter Entrance Stairs-Rails: Can reach both Entrance Stairs-Number of Steps: 2 Home Layout: One level Home Equipment: None      Prior Function Level of Independence: Independent         Comments: works as a Scientist, 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   Dominant Hand: Right    Extremity/Trunk Assessment   Upper Extremity Assessment Upper Extremity Assessment: Generalized weakness    Lower Extremity Assessment Lower Extremity Assessment: Generalized weakness(some impaired functional coordination during gait)       Communication      Cognition Arousal/Alertness: Awake/alert Behavior During Therapy: WFL for tasks assessed/performed Overall Cognitive Status: Impaired/Different from baseline Area of Impairment: Safety/judgement;Attention;Awareness;Problem solving  Current Attention Level: Sustained     Safety/Judgement: Decreased awareness of safety Awareness: Intellectual Problem Solving: Slow processing;Difficulty sequencing;Requires verbal  cues General Comments: patient with difficulty sequencing throughout basic ADL tasks suck as hygiene and pericare when toileting and washing hands.       General Comments      Exercises     Assessment/Plan    PT Assessment Patient needs continued PT services  PT Problem List Decreased strength;Decreased activity tolerance;Decreased balance;Decreased mobility;Decreased coordination;Decreased cognition;Decreased safety awareness       PT Treatment Interventions DME instruction;Gait training;Stair training;Functional mobility training;Therapeutic activities;Therapeutic exercise;Balance training;Patient/family education    PT Goals (Current goals can be found in the Care Plan section)  Acute Rehab PT Goals Patient Stated Goal: to go home PT Goal Formulation: With patient Time For Goal Achievement: 11/13/17 Potential to Achieve Goals: Good    Frequency Min 3X/week   Barriers to discharge Decreased caregiver support      Co-evaluation               AM-PAC PT "6 Clicks" Daily Activity  Outcome Measure Difficulty turning over in bed (including adjusting bedclothes, sheets and blankets)?: None Difficulty moving from lying on back to sitting on the side of the bed? : A Little Difficulty sitting down on and standing up from a chair with arms (e.g., wheelchair, bedside commode, etc,.)?: Unable Help needed moving to and from a bed to chair (including a wheelchair)?: A Little Help needed walking in hospital room?: A Little Help needed climbing 3-5 steps with a railing? : A Lot 6 Click Score: 16    End of Session Equipment Utilized During Treatment: Gait belt Activity Tolerance: Patient tolerated treatment well Patient left: in bed;with call bell/phone within reach;with nursing/sitter in room Nurse Communication: Mobility status PT Visit Diagnosis: Unsteadiness on feet (R26.81);Ataxic gait (R26.0)    Time: 8841-6606 PT Time Calculation (min) (ACUTE ONLY): 19  min   Charges:   PT Evaluation $PT Eval Moderate Complexity: 1 Mod     PT G Codes:        Alben Deeds, PT DPT  Board Certified Neurologic Specialist [place] 10/30/2017, 1:36 PM

## 2017-10-30 NOTE — Progress Notes (Signed)
Visited with patient. She was open and receptive to chaplain having prayer with her.  Conard Novak, Chaplain   10/30/17 1300  Clinical Encounter Type  Visited With Patient;Other (Comment) (and sitter)  Visit Type Initial;Spiritual support  Referral From Nurse  Consult/Referral To Chaplain  Spiritual Encounters  Spiritual Needs Prayer;Emotional  Stress Factors  Patient Stress Factors Health changes  Family Stress Factors Not reviewed

## 2017-10-30 NOTE — Progress Notes (Signed)
Informed Dr. Waldron Labs that patient is a little more oriented than before but is still spacey and having trouble remembering anything that has happened. MD to assess patient.

## 2017-10-30 NOTE — Progress Notes (Signed)
Lori Butler [id number] Admission Data: 10/30/2017 2:14 AM Attending Provider: Rise Patience, MD  [facility]:RVACQP, Lori Haw, MD Consults/ Treatment Team:   Lori Butler is a 67 y.o. female patient admitted from ED awake, alert  & orientated  X 3,  Full Code, VSS - Blood pressure 124/71, pulse 96, temperature 97.9 F (36.6 C), temperature source Axillary, resp. rate 20, SpO2 100 %., O2 RA, no c/o shortness of breath, no c/o chest pain. She is anxious, fidgety, paranoid, intermittent hallucinations and impulsive. Sitter was order with no sitter available. Pulled NT from our floor to sit with her as she is a high fall risk. Progressive Care Tele #10 placed and pt is currently running: NSR   IV site WDL:  Right Wrist with a transparent dsg that's clean dry and intact.  Allergies:   Allergies  Allergen Reactions  . Demerol [Meperidine] Other (See Comments)    "horrible reaction"  . Oxycodone-Acetaminophen Other (See Comments)    Upset stomach      Past Medical History:  Diagnosis Date  . COPD (chronic obstructive pulmonary disease) (HCC)     History:  COPD Tobacco/alcohol: Former smoker  Pt orientation to unit, room and routine. Information packet given to patient.  Admission INP armband ID verified with patient, and in place. Fall risk assessment complete. Pt verbalizes an understanding of how to use the call bell and to call for help before getting out of bed.  Skin, clean-dry- intact without evidence of bruising, or skin tears. No evidence of skin break down noted on exam. CHG bath given. Call bell within reach.    Will cont to monitor and assist as needed.  Lori Champagne, RN 10/30/2017 2:14 AM

## 2017-10-30 NOTE — Progress Notes (Signed)
EEG Completed; Results Pending  

## 2017-10-30 NOTE — Care Management Obs Status (Signed)
[facility] NOTIFICATION   Patient Details  Name: Lori Butler MRN: [id number] Date of Birth: 10-Sep-1950   Medicare Observation Status Notification Given:  Yes    Sharin Mons, RN 10/30/2017, 1:38 PM

## 2017-10-30 NOTE — H&P (Addendum)
History and Physical    Lori Butler PPI:[id number] DOB: 24-Mar-1951 DOA: 10/29/2017  PCP: Lori Lass, MD  Patient coming from: Home.  History obtained from ER physician as patient's family unavailable and unable to be reached.  Chief Complaint: Confusion.  HPI: Lori Butler is a 67 y.o. female with history of COPD and depression was brought to the ER after patient was found to be confused.  As per the ER physician patient's son called maintenance for welfare check as patient did not pick her dog.  On arrival at the patient's house patient was found to be confused and was brought to the ER.  ED Course: In the ER patient is found to be oriented to her name and place.  With at times confused.  Patient states she was told to take some medication but does not recall the name.  CT head was unremarkable.  Bilirubin is around 3.4 patient had urinary retention of 690 cc and had an and out catheter done.  Pupils were sluggish.  Patient looks dry tachycardic with leukocytosis but afebrile CK level was negative.  At this time concern for anticholinergic overdose.  But patient does not states she has taken anything.  Review of Systems: As per HPI, rest all negative.   Past Medical History:  Diagnosis Date  . COPD (chronic obstructive pulmonary disease) ([place])     Past Surgical History:  Procedure Laterality Date  . ABDOMINAL HYSTERECTOMY    . APPENDECTOMY    . BREAST BIOPSY    . BREAST EXCISIONAL BIOPSY    . TONSILLECTOMY       reports that she quit smoking about 7 weeks ago. Her smoking use included cigarettes. She has a 76.50 pack-year smoking history. She has never used smokeless tobacco. She reports that she does not drink alcohol or use drugs.  Allergies  Allergen Reactions  . Demerol [Meperidine] Other (See Comments)    "horrible reaction"  . Oxycodone-Acetaminophen Other (See Comments)    Upset stomach     Family History  Problem Relation Age of Onset  . Breast cancer  Mother     Prior to Admission medications   Medication Sig Start Date End Date Taking? Authorizing Provider  acetaminophen (TYLENOL) 500 MG tablet Take 1,000 mg by mouth every 6 (six) hours as needed for mild pain or headache.   Yes [provider]  albuterol (PROVENTIL HFA;VENTOLIN HFA) 108 (90 Base) MCG/ACT inhaler Inhale 2 puffs into the lungs every 6 (six) hours as needed for wheezing or shortness of breath. 09/16/17  Yes Sheikh, Lori Latif, DO  albuterol (PROVENTIL) (2.5 MG/3ML) 0.083% nebulizer solution Take 3 mLs (2.5 mg total) by nebulization every 4 (four) hours as needed for wheezing or shortness of breath. 09/16/17  Yes Sheikh, Lori Latif, DO  alendronate (FOSAMAX) 70 MG tablet Take 70 mg by mouth once a week. On Wednesdays 07/11/17  Yes [provider]  Fluticasone-Salmeterol (ADVAIR DISKUS) 100-50 MCG/DOSE AEPB Inhale 1 puff into the lungs 2 (two) times daily. 09/16/17 09/16/18 Yes Sheikh, Lori Latif, DO  guaiFENesin (MUCINEX) 600 MG 12 hr tablet Take 2 tablets (1,200 mg total) by mouth 2 (two) times daily. 09/16/17  Yes Sheikh, Lori Latif, DO  naproxen sodium (ALEVE) 220 MG tablet Take 220 mg by mouth 2 (two) times daily as needed (pain).   Yes [provider]  tiotropium (SPIRIVA HANDIHALER) 18 MCG inhalation capsule Place 1 capsule (18 mcg total) into inhaler and inhale daily. 09/16/17 09/16/18 Yes Lori Butler [place],  DO  umeclidinium-vilanterol (ANORO ELLIPTA) 62.5-25 MCG/INH AEPB Inhale 1 puff into the lungs daily. 10/26/17  Yes Lori Noel, MD  Vitamin D, Ergocalciferol, (DRISDOL) 50000 units CAPS capsule Take 50,000 Units by mouth every 7 (seven) days.   Yes [provider]    Physical Exam: Vitals:   10/29/17 2315 10/29/17 2330 10/29/17 2345 10/30/17 0017  BP: 123/73 123/72 124/68   Pulse: 100 99 98   Resp: (!) 27 17 18    Temp:    97.9 F (36.6 C)  TempSrc:    Axillary  SpO2: 93% 97% 96%       Constitutional: Moderately built and  nourished Vitals:   10/29/17 2315 10/29/17 2330 10/29/17 2345 10/30/17 0017  BP: 123/73 123/72 124/68   Pulse: 100 99 98   Resp: (!) 27 17 18    Temp:    97.9 F (36.6 C)  TempSrc:    Axillary  SpO2: 93% 97% 96%    Eyes: Anicteric no pallor. ENMT: No discharge from the ears eyes nose or mouth. Neck: No mass felt.  No neck rigidity.  No JVD appreciated. Respiratory: No rhonchi or crepitations. Cardiovascular: S1-S2 heard tachycardic. Abdomen: Soft nontender bowel sounds present. Musculoskeletal: No edema. Skin: No rash. Neurologic: Alert awake oriented to name and place moves all extremities.  No tremors no myoclonus. Psychiatric: Denies any suicidal ideation.   Labs on Admission: I have personally reviewed following labs and imaging studies  CBC: Recent Labs  Lab 10/29/17 1901  WBC 15.4*  NEUTROABS 13.6*  HGB 13.1  HCT 40.0  MCV 90.3  PLT 502   Basic Metabolic Panel: Recent Labs  Lab 10/29/17 1901  NA 140  K 3.8  CL 103  CO2 22  GLUCOSE 136*  BUN 15  CREATININE 0.86  CALCIUM 8.9  MG 1.9   GFR: Estimated Creatinine Clearance: 60.9 mL/min (by C-G formula based on SCr of 0.86 mg/dL). Liver Function Tests: Recent Labs  Lab 10/29/17 1901  AST 35  ALT 46  ALKPHOS 138*  BILITOT 3.4*  PROT 7.3  ALBUMIN 4.1   No results for input(s): LIPASE, AMYLASE in the last 168 hours. No results for input(s): AMMONIA in the last 168 hours. Coagulation Profile: No results for input(s): INR, PROTIME in the last 168 hours. Cardiac Enzymes: Recent Labs  Lab 10/29/17 2048  CKTOTAL 200   BNP (last 3 results) No results for input(s): PROBNP in the last 8760 hours. HbA1C: No results for input(s): HGBA1C in the last 72 hours. CBG: No results for input(s): GLUCAP in the last 168 hours. Lipid Profile: No results for input(s): CHOL, HDL, LDLCALC, TRIG, CHOLHDL, LDLDIRECT in the last 72 hours. Thyroid Function Tests: No results for input(s): TSH, T4TOTAL, FREET4, T3FREE,  THYROIDAB in the last 72 hours. Anemia Panel: No results for input(s): VITAMINB12, FOLATE, FERRITIN, TIBC, IRON, RETICCTPCT in the last 72 hours. Urine analysis:    Component Value Date/Time   COLORURINE YELLOW 07/12/2012 1842   APPEARANCEUR CLEAR 07/12/2012 1842   LABSPEC 1.025 07/12/2012 1842   PHURINE 5.0 07/12/2012 1842   GLUCOSEU NEGATIVE 07/12/2012 1842   HGBUR NEGATIVE 07/12/2012 1842   BILIRUBINUR NEGATIVE 07/12/2012 1842   KETONESUR NEGATIVE 07/12/2012 1842   PROTEINUR NEGATIVE 07/12/2012 1842   UROBILINOGEN 1.0 07/12/2012 1842   NITRITE NEGATIVE 07/12/2012 1842   LEUKOCYTESUR MODERATE (A) 07/12/2012 1842   Sepsis Labs: @LABRCNTIP (procalcitonin:4,lacticidven:4) )No results found for this or any previous visit (from the past 240 hour(s)).   Radiological Exams on Admission: Ct  Head Wo Contrast  Result Date: 10/29/2017 CLINICAL DATA:  Altered mental status, anxiety, shortness of breath. EXAM: CT HEAD WITHOUT CONTRAST TECHNIQUE: Contiguous axial images were obtained from the base of the skull through the vertex without intravenous contrast. COMPARISON:  None. FINDINGS: Brain: Mild generalized volume loss with commensurate dilatation of the ventricles and sulci. Incidental note is made of a prominent perivascular space within the lower portion of the RIGHT basal ganglia. Questionable small old lacunar infarcts within the basal ganglia regions and pons. There is no mass, hemorrhage, edema or other evidence of acute parenchymal abnormality. No extra-axial hemorrhage. Vascular: There are chronic calcified atherosclerotic changes of the large vessels at the skull base. No unexpected hyperdense vessel. Skull: Normal. Negative for fracture or focal lesion. Sinuses/Orbits: No acute finding. Other: None. IMPRESSION: 1. No acute findings.  No intracranial mass, hemorrhage or edema. 2. Questionable small old lacunar infarcts within the basal ganglia regions and pons. Electronically Signed   By:  Franki Cabot M.D.   On: 10/29/2017 21:41    EKG: Independently reviewed.  Sinus tachycardia QTC 506 ms QRS 81 ms.  Assessment/Plan Principal Problem:   SIRS (systemic inflammatory response syndrome) (HCC) Active Problems:   COPD (chronic obstructive pulmonary disease) (HCC)   Acute encephalopathy    1. SIRS/acute encephalopathy -suspect medication related specifically anticholinergic.  Patient was given 1 dose of Ativan for agitation.  We will continue to monitor and stepdown.  Recheck salicylate Tylenol LFTs and get MRI of the brain if patient still confused.  Check TSH and ammonia levels.  Hold Wellbutrin.  Check blood cultures. 2. History of COPD presently not wheezing.  Addendum -discussed with poison control.  At this time the requested to monitor for 24 hours minimum.  To recheck EKG and if QRS is prolonged more than 120 ms and will need 1 to 2 mEq/kg bicarb pushes until QRS gets corrected.  After 2 doses may need to check with toxicologist at [facility] control.  If QTC is prolonged check magnesium and potassium immediately.  PRN Ativan for agitation.  Avoid Haldol or Geodon.  Since patient is hallucinating but not agitated I have ordered MRI of the brain.   DVT prophylaxis: SCDs. Code Status: Full code. Family Communication: Unable to reach family. Disposition Plan: To be determined. Consults called: None. Admission status: Observation.   Rise Patience MD [facility] Pager [phone].  If 7PM-7AM, please contact night-coverage [email or web address] Password [email or web address]  10/30/2017, 12:44 AM

## 2017-10-30 NOTE — Progress Notes (Signed)
Hypoglycemic Event  CBG: 48 Treatment: 15 GM carbohydrate snack  Symptoms: Shaky  Follow-up CBG: [id number] CBG Result: 83  Possible Reasons for Event: Inadequate meal intake  Comments/MD notified: Dr. Elgergawy    [place] N Breeze [place]

## 2017-10-30 NOTE — Procedures (Signed)
ELECTROENCEPHALOGRAM REPORT  Date of Study: 10/30/2017  Patient's Name: Lori Butler MRN: [id number] Date of Birth: Jul 31, 1950  Referring Provider: Dr. Gean Birchwood  Clinical History: This is a 66 year old woman with altered mental status.  Medications: Ativan  Technical Summary: A multichannel digital EEG recording measured by the international 10-20 system with electrodes applied with paste and impedances below 5000 ohms performed in our laboratory with EKG monitoring in an awake and drowsy patient.  Hyperventilation and photic stimulation were not performed.  The digital EEG was referentially recorded, reformatted, and digitally filtered in a variety of bipolar and referential montages for optimal display.    Description: The patient is awake and drowsy during the recording.  During maximal wakefulness, there is a symmetric, medium voltage 9 Hz posterior dominant rhythm that attenuates with eye opening.  The record is symmetric.  During drowsiness, there is an increase in theta slowing of the background, at times sharply contoured without clear epileptogenic potential. Sleep was not captured. Hyperventilation and photic stimulation were not performed. There is muscle artifact obscuring frontal leads throughout the study. There were no epileptiform discharges or electrographic seizures seen.    EKG lead was unremarkable.  Impression: This awake and drowsy EEG is within normal limits.  Clinical Correlation: A normal EEG does not exclude a clinical diagnosis of epilepsy. This study is slightly limited due to muscle artifact obscuring frontal leads. Clinical correlation is advised.   Ellouise Newer, M.D.

## 2017-10-30 NOTE — Progress Notes (Signed)
Hypoglycemic Event  CBG:55  Treatment: 15 GM carbohydrate snack  Symptoms: Shaky  Follow-up CBG: Time:1657 CBG Result:108  Possible Reasons for Event: Unknown  Comments/MD notified:     Lori Butler

## 2017-10-30 NOTE — Progress Notes (Signed)
Through out the night patient has been very "spacey". She grabs at things in front of her that are not there, have conversations with no one. Fidgety with her hands. She is easy to orient back to reality. She knows the year, knows where she is and who she is. She has difficulty with the reason for admission. Will continue to monitor.

## 2017-10-31 ENCOUNTER — Encounter (HOSPITAL_COMMUNITY): Payer: Self-pay | Admitting: General Practice

## 2017-10-31 DIAGNOSIS — G9340 Encephalopathy, unspecified: Secondary | ICD-10-CM | POA: Diagnosis not present

## 2017-10-31 DIAGNOSIS — J449 Chronic obstructive pulmonary disease, unspecified: Secondary | ICD-10-CM

## 2017-10-31 DIAGNOSIS — E538 Deficiency of other specified B group vitamins: Secondary | ICD-10-CM | POA: Diagnosis not present

## 2017-10-31 LAB — CBC
HEMATOCRIT: 38.8 % (ref 36.0–46.0)
HEMOGLOBIN: 12.3 g/dL (ref 12.0–15.0)
MCH: 29.3 pg (ref 26.0–34.0)
MCHC: 31.7 g/dL (ref 30.0–36.0)
MCV: 92.4 fL (ref 78.0–100.0)
Platelets: 272 10*3/uL (ref 150–400)
RBC: 4.2 MIL/uL (ref 3.87–5.11)
RDW: 13.7 % (ref 11.5–15.5)
WBC: 10 10*3/uL (ref 4.0–10.5)

## 2017-10-31 LAB — BASIC METABOLIC PANEL
Anion gap: 7 (ref 5–15)
BUN: 17 mg/dL (ref 8–23)
CHLORIDE: 109 mmol/L (ref 98–111)
CO2: 22 mmol/L (ref 22–32)
CREATININE: 0.67 mg/dL (ref 0.44–1.00)
Calcium: 8.1 mg/dL — ABNORMAL LOW (ref 8.9–10.3)
GFR calc non Af Amer: >60 mL/min (ref >60)
Glucose, Bld: 97 mg/dL (ref 70–99)
Potassium: 4.2 mmol/L (ref 3.5–5.1)
Sodium: 138 mmol/L (ref 135–145)

## 2017-10-31 LAB — GLUCOSE, CAPILLARY
GLUCOSE-CAPILLARY: 64 mg/dL — AB (ref 70–99)
GLUCOSE-CAPILLARY: 90 mg/dL (ref 70–99)
Glucose-Capillary: 100 mg/dL — ABNORMAL HIGH (ref 70–99)
Glucose-Capillary: 103 mg/dL — ABNORMAL HIGH (ref 70–99)
Glucose-Capillary: 120 mg/dL — ABNORMAL HIGH (ref 70–99)
Glucose-Capillary: 76 mg/dL (ref 70–99)
Glucose-Capillary: 77 mg/dL (ref 70–99)
Glucose-Capillary: 84 mg/dL (ref 70–99)

## 2017-10-31 MED ORDER — ONDANSETRON HCL 4 MG/2ML IJ SOLN
4.0000 mg | Freq: Four times a day (QID) | INTRAMUSCULAR | Status: DC | PRN
Start: 2017-10-31 — End: 2017-11-02

## 2017-10-31 MED ORDER — BUPROPION HCL ER (SR) 100 MG PO TB12
100.0000 mg | ORAL_TABLET | Freq: Every day | ORAL | Status: DC
  Administered 2017-10-31 – 2017-11-02 (×3): 100 mg via ORAL
  Filled 2017-10-31 (×3): qty 1

## 2017-10-31 MED ORDER — BOOST / RESOURCE BREEZE PO LIQD CUSTOM
1.0000 | Freq: Three times a day (TID) | ORAL | Status: DC
  Administered 2017-10-31 – 2017-11-01 (×4): 1 via ORAL

## 2017-10-31 MED ORDER — ENSURE ENLIVE PO LIQD
237.0000 mL | Freq: Three times a day (TID) | ORAL | Status: DC
  Administered 2017-10-31: 237 mL via ORAL

## 2017-10-31 MED ORDER — ADULT MULTIVITAMIN W/MINERALS CH
1.0000 | ORAL_TABLET | Freq: Every day | ORAL | Status: DC
  Administered 2017-10-31 – 2017-11-02 (×3): 1 via ORAL
  Filled 2017-10-31 (×3): qty 1

## 2017-10-31 NOTE — Progress Notes (Signed)
Patient and son have selected [facility]. They will begin insurance pre-authorization.   Percell Locus [place] LCSW [phone]

## 2017-10-31 NOTE — Progress Notes (Signed)
[facility] PROGRESS NOTE  Haleemah Buckalew IHK:[id number] DOB: [birth year] DOA: 10/29/2017  PCP: [name], MD  Brief History/Interval Summary: 67 year old Caucasian female with a past medical history of COPD and depression was brought into the hospital due to confusion.  Patient was noted to be distracted and confused for her family members.  Recently started on an inhaler but not on any oral medications.  Reason for Visit: Acute metabolic encephalopathy  Consultants: None  Procedures: EEG  Antibiotics: None  Subjective/Interval History: Patient states that she is feeling better.  Not as confused as before.  Her son is at the bedside and he feels that she is improved.  Patient denies any headaches.  ROS: Denies any nausea or vomiting.  Objective:  Vital Signs  Vitals:   10/31/17 0020 10/31/17 0114 10/31/17 0433 10/31/17 0603  BP:  133/81  133/76  Pulse:  87  80  Resp:  20  (!) 21  Temp: 97.9 F (36.6 C)  98.2 F (36.8 C)   TempSrc: Oral  Oral   SpO2:  95%  96%    Intake/Output Summary (Last 24 hours) at 10/31/2017 1201 Last data filed at 10/31/2017 1032 Gross per 24 hour  Intake 675.73 ml  Output 900 ml  Net -224.27 ml   There were no vitals filed for this visit.  General appearance: alert, cooperative, appears stated age and no distress Head: Normocephalic, without obvious abnormality, atraumatic Resp: clear to auscultation bilaterally Cardio: regular rate and rhythm, S1, S2 normal, no murmur, click, rub or gallop GI: soft, non-tender; bowel sounds normal; no masses,  no organomegaly Extremities: extremities normal, atraumatic, no cyanosis or edema Neurologic: Patient is awake and alert.  Oriented to person place time.  No facial asymmetry.  Motor strength equal bilateral upper and lower extremities.  Lab Results:  Data Reviewed: I have personally reviewed following labs and imaging studies  CBC: Recent Labs  Lab 10/29/17 1901 10/30/17 0230  10/31/17 0625  WBC 15.4* 16.9* 10.0  NEUTROABS 13.6* 12.9*  --   HGB 13.1 12.6 12.3  HCT 40.0 39.1 38.8  MCV 90.3 91.8 92.4  PLT 305 264 756    Basic Metabolic Panel: Recent Labs  Lab 10/29/17 1901 10/30/17 0230 10/31/17 0625  NA 140 143 138  K 3.8 4.2 4.2  CL 103 109 109  CO2 22 21* 22  GLUCOSE 136* 60* 97  BUN 15 20 17   CREATININE 0.86 0.85 0.67  CALCIUM 8.9 8.6* 8.1*  MG 1.9 2.0  --     GFR: Estimated Creatinine Clearance: 65.5 mL/min (by C-G formula based on SCr of 0.67 mg/dL).  Liver Function Tests: Recent Labs  Lab 10/29/17 1901 10/30/17 0230  AST 35 31  ALT 46 44  ALKPHOS 138* 127*  BILITOT 3.4* 1.4*  PROT 7.3 6.9  ALBUMIN 4.1 4.1     Recent Labs  Lab 10/30/17 0230  AMMONIA 30    Cardiac Enzymes: Recent Labs  Lab 10/29/17 2048 10/30/17 0230 10/30/17 0831 10/30/17 1517 10/30/17 1940  CKTOTAL 200  --   --   --   --   TROPONINI  --  <0.03 <0.03 <0.03 <0.03    CBG: Recent Labs  Lab 10/31/17 0020 10/31/17 0427 10/31/17 0454 10/31/17 0754 10/31/17 1138  GLUCAP 76 64* 103* 90 84    Thyroid Function Tests: Recent Labs    10/30/17 0230  TSH 3.150     Recent Results (from the past 240 hour(s))  MRSA PCR Screening  Status: None   Collection Time: 10/30/17  5:21 AM  Result Value Ref Range Status   MRSA by PCR NEGATIVE NEGATIVE Final    Comment:        The GeneXpert MRSA Assay (FDA approved for NASAL specimens only), is one component of a comprehensive MRSA colonization surveillance program. It is not intended to diagnose MRSA infection nor to guide or monitor treatment for MRSA infections. Performed at [facility], [place] [address], [place],  [address]       Radiology Studies: Ct Head Wo Contrast  Result Date: 10/29/2017 CLINICAL DATA:  Altered mental status, anxiety, shortness of breath. EXAM: CT HEAD WITHOUT CONTRAST TECHNIQUE: Contiguous axial images were obtained from the base of the skull through  the vertex without intravenous contrast. COMPARISON:  None. FINDINGS: Brain: Mild generalized volume loss with commensurate dilatation of the ventricles and sulci. Incidental note is made of a prominent perivascular space within the lower portion of the RIGHT basal ganglia. Questionable small old lacunar infarcts within the basal ganglia regions and pons. There is no mass, hemorrhage, edema or other evidence of acute parenchymal abnormality. No extra-axial hemorrhage. Vascular: There are chronic calcified atherosclerotic changes of the large vessels at the skull base. No unexpected hyperdense vessel. Skull: Normal. Negative for fracture or focal lesion. Sinuses/Orbits: No acute finding. Other: None. IMPRESSION: 1. No acute findings.  No intracranial mass, hemorrhage or edema. 2. Questionable small old lacunar infarcts within the basal ganglia regions and pons. Electronically Signed   By: Franki Cabot M.D.   On: 10/29/2017 21:41   Mr Brain Wo Contrast  Result Date: 10/30/2017 CLINICAL DATA:  Altered mental status EXAM: MRI HEAD WITHOUT CONTRAST TECHNIQUE: Multiplanar, multiecho pulse sequences of the brain and surrounding structures were obtained without intravenous contrast. COMPARISON:  Head CT 10/29/2017 FINDINGS: BRAIN: There is no acute infarct, acute hemorrhage or mass effect. The midline structures are normal. There are no old infarcts. Multifocal white matter hyperintensity, most commonly due to chronic ischemic microangiopathy. Enlarged perivascular space of the right lentiform nucleus. Susceptibility-sensitive sequences show no chronic microhemorrhage or superficial siderosis. VASCULAR: Major intracranial arterial and venous sinus flow voids are preserved. SKULL AND UPPER CERVICAL SPINE: The visualized skull base, calvarium, upper cervical spine and extracranial soft tissues are normal. SINUSES/ORBITS: No fluid levels or advanced mucosal thickening. No mastoid or middle ear effusion. The orbits are  normal. IMPRESSION: Mild chronic small vessel disease without acute intracranial abnormality Electronically Signed   By: Ulyses Jarred M.D.   On: 10/30/2017 18:56   Dg Chest Port 1 View  Result Date: 10/30/2017 CLINICAL DATA:  67 year old female with altered mental status and systemic inflammatory response syndrome. EXAM: PORTABLE CHEST 1 VIEW COMPARISON:  Chest radiograph dated 09/16/2017 FINDINGS: There is emphysematous changes of the lungs with areas of biapical subpleural scarring. No focal consolidation, pleural effusion, or pneumothorax. The cardiac silhouette is within normal limits. No acute osseous pathology. IMPRESSION: No acute cardiopulmonary process. Emphysema. Electronically Signed   By: Anner Crete M.D.   On: 10/30/2017 02:14     Medications:  Scheduled: . feeding supplement  1 Container Oral TID BM  . multivitamin with minerals  1 tablet Oral Daily   Continuous: . cefTRIAXone (ROCEPHIN)  IV Stopped (10/30/17 1658)   BPZ:WCHENIDPOEUM, LORazepam, ondansetron (ZOFRAN) IV  Assessment/Plan:    Acute metabolic encephalopathy Reason for her encephalopathy is not clear.  In the hospital in May for the patient.  She was on steroids at that time.  But she has completed the course of the same.  MRI brain without any acute findings.  EEG without any epileptiform activity.  Ammonia and TSH levels were normal.  HIV nonreactive.  Check vitamin B12 level.  Patient's mental status appears to have improved.  Could be close to baseline.  Apparently there was concern for this being caused by anticholinergic medication but none noted on her home medication list.  During her previous admission it appears that she was on Wellbutrin but it is not showing up on her current home medication list.  It appears that she stopped taking it recently.  So presentation could have been due to withdrawal effects.  Will resume.  History of COPD Stable.  Was recently hospitalized for  exacerbation.  Leukocytosis WBC is normal today.  Possible UTI UA was noted to be mildly abnormal.  Due to her encephalopathy and leukocytosis she was empirically started on IV antibiotics.  Urine culture is pending.   DVT Prophylaxis: SCDs    Code Status: Full code Family Communication: Discussed with the patient and her son Disposition Plan: Management as outlined above.    LOS: 0 days   [facility] Pager [phone] 10/31/2017, 12:01 PM  If 7PM-7AM, please contact night-coverage at [email or web address], password [facility]

## 2017-10-31 NOTE — Progress Notes (Signed)
Hypoglycemic Event  CBG: 64  Treatment: 15 GM carbohydrate snack  Symptoms: Shaky  Follow-up CBG: LKGM:0102 CBG Result:103  Possible Reasons for Event: Inadequate meal intake  Comments/MD notified:Kirby, NP    Lori Butler [place]

## 2017-10-31 NOTE — NC FL2 (Signed)
  [place] LEVEL OF CARE SCREENING TOOL     IDENTIFICATION  Patient Name: Lori Butler Birthdate: 01-11-1951 Sex: female Admission Date (Current Location): 10/29/2017  [facility] and [place] Number:  Herbalist and Address:  [facility] [place], [place] [address], [place], [place] [address]      Provider Number: [id number]  Attending Physician Name and Address:  Bonnielee Haff, MD  Relative Name and Phone Number:  Baron Sane, son, [phone]     Current Level of Care: Hospital Recommended Level of Care: [place] Prior Approval Number:    Date Approved/Denied:   PASRR Number: [id number]  Discharge Plan: SNF    Current Diagnoses: Patient Active Problem List   Diagnosis Date Noted  . SIRS (systemic inflammatory response syndrome) ([place]) 10/30/2017  . Acute encephalopathy 10/29/2017  . Tobacco abuse 09/20/2017  . COPD (chronic obstructive pulmonary disease) ([place]) 09/15/2017    Orientation RESPIRATION BLADDER Height & Weight     Self, Place  Normal Continent Weight:   Height:     BEHAVIORAL SYMPTOMS/MOOD NEUROLOGICAL BOWEL NUTRITION STATUS      Continent Diet(Please see DC Summary)  AMBULATORY STATUS COMMUNICATION OF NEEDS Skin   Limited Assist Verbally Normal                       Personal Care Assistance Level of Assistance  Bathing, Feeding, Dressing Bathing Assistance: Limited assistance Feeding assistance: Independent Dressing Assistance: Limited assistance     Functional Limitations Info  Sight, Hearing, Speech Sight Info: Impaired Hearing Info: Adequate Speech Info: Adequate    SPECIAL CARE FACTORS FREQUENCY  PT (By licensed PT), OT (By licensed OT)     PT Frequency: 5x OT Frequency: 3x            Contractures      Additional Factors Info  Code Status, Allergies Code Status Info: Full Allergies Info: Demerol Meperidine, Oxycodone-acetaminophen           Current Medications  (10/31/2017):  This is the current hospital active medication list Current Facility-Administered Medications  Medication Dose Route Frequency Provider Last Rate Last Dose  . cefTRIAXone (ROCEPHIN) 1 g in sodium chloride 0.9 % 100 mL IVPB  1 g Intravenous Q24H Elgergawy, Silver Huguenin, MD   Stopped at 10/30/17 1658  . feeding supplement (ENSURE ENLIVE) (ENSURE ENLIVE) liquid 237 mL  237 mL Oral TID BM Bonnielee Haff, MD      . levalbuterol [facility]) nebulizer solution 0.63 mg  0.63 mg Nebulization Q6H PRN Rise Patience, MD      . LORazepam (ATIVAN) injection 0.5 mg  0.5 mg Intravenous Q4H PRN Rise Patience, MD      . ondansetron [facility]) injection 4 mg  4 mg Intravenous Q6H PRN Bonnielee Haff, MD         Discharge Medications: Please see discharge summary for a list of discharge medications.  Relevant Imaging Results:  Relevant Lab Results:   Additional Information SSN: [place] [id number]  [place], [place]

## 2017-10-31 NOTE — Progress Notes (Signed)
Initial Nutrition Assessment  DOCUMENTATION CODES:   Not applicable  INTERVENTION:   -D/c Ensure Enlive po BID, each supplement provides 350 kcal and 20 grams of protein -Boost Breeze po TID, each supplement provides 250 kcal and 9 grams of protein -MVI with minerals daily -Magic Cup TID with meals, each supplement provides 290 kcals and 9 grams protein  NUTRITION DIAGNOSIS:   Inadequate oral intake related to decreased appetite(altered taste perception) as evidenced by meal completion < 50%, per patient/family report.  GOAL:   Patient will meet greater than or equal to 90% of their needs  MONITOR:   PO intake, Supplement acceptance, Labs, Weight trends, Skin, I & O's  REASON FOR ASSESSMENT:   Malnutrition Screening Tool, Consult Assessment of nutrition requirement/status  ASSESSMENT:   Lori Butler is a 67 y.o. female with history of COPD and depression was brought to the ER after patient was found to be confused.  Pt admitted with SIRS/ acute encephalopathy.   6/25- s/p EEG  Spoke with pt and son at bedside. Both report that pt has never been a "bog eater". Per son, pt has always been small framed and usually consumes small portions of food at baseline. Per pt, she usually consumes 2 meals per day (Breakfast: donut, Dinner: meat, starch, and vegetable). Pt reports over the past week, she has been eating very little and mostly "been living off crackers". Pt reports that she stopped smoking about 6 weeks ago and most foods taste bland to her. She also reports that she has been consuming foods that are unfamiliar to her, such as ice cream.   Pt denies any weight loss. She reports UBW is around 145#.   Pt reports she ate very little breakfast. Noted multiple drinks at bedside- pt consumed only a sip of Ensure and does not like it. Discussed importance of good meal and supplement intake to promote healing and hypoglycemic episodes. Pt amenable to try Boost Breeze.   Labs  reviewed: CBGS: 64-103.   NUTRITION - FOCUSED PHYSICAL EXAM:    Most Recent Value  Orbital Region  No depletion  Upper Arm Region  No depletion  Thoracic and Lumbar Region  No depletion  Buccal Region  No depletion  Temple Region  No depletion  Clavicle Bone Region  No depletion  Clavicle and Acromion Bone Region  No depletion  Scapular Bone Region  No depletion  Dorsal Hand  No depletion  Patellar Region  No depletion  Anterior Thigh Region  No depletion  Posterior Calf Region  No depletion  Edema (RD Assessment)  None  Hair  Reviewed  Eyes  Reviewed  Mouth  Reviewed  Skin  Reviewed  Nails  Reviewed       Diet Order:   Diet Order           Diet regular Room service appropriate? Yes; Fluid consistency: Thin  Diet effective now          EDUCATION NEEDS:   Education needs have been addressed  Skin:  Skin Assessment: Reviewed RN Assessment  Last BM:  10/29/17  Height:   Ht Readings from Last 1 Encounters:  10/31/17 5\' 5"  (1.651 m)    Weight:   Wt Readings from Last 1 Encounters:  10/31/17 154 lb 15.7 oz (70.3 kg)    Ideal Body Weight:  56.8 kg  BMI:  Body mass index is 25.79 kg/m.  Estimated Nutritional Needs:   Kcal:  1700-1900  Protein:  85-100 grams  Fluid:  1.7-1.9  L    Micky Sheller A. Jimmye Norman, RD, LDN, CDE Pager: [phone] After hours Pager: [phone]

## 2017-11-01 DIAGNOSIS — Z87891 Personal history of nicotine dependence: Secondary | ICD-10-CM | POA: Diagnosis not present

## 2017-11-01 DIAGNOSIS — E538 Deficiency of other specified B group vitamins: Secondary | ICD-10-CM | POA: Diagnosis not present

## 2017-11-01 DIAGNOSIS — J449 Chronic obstructive pulmonary disease, unspecified: Secondary | ICD-10-CM | POA: Diagnosis not present

## 2017-11-01 DIAGNOSIS — R4182 Altered mental status, unspecified: Secondary | ICD-10-CM | POA: Diagnosis not present

## 2017-11-01 DIAGNOSIS — G9340 Encephalopathy, unspecified: Secondary | ICD-10-CM | POA: Diagnosis not present

## 2017-11-01 LAB — GLUCOSE, CAPILLARY
GLUCOSE-CAPILLARY: 96 mg/dL (ref 70–99)
GLUCOSE-CAPILLARY: 98 mg/dL (ref 70–99)
GLUCOSE-CAPILLARY: 117 mg/dL — AB (ref 70–99)
GLUCOSE-CAPILLARY: 84 mg/dL (ref 70–99)
GLUCOSE-CAPILLARY: 102 mg/dL — AB (ref 70–99)

## 2017-11-01 LAB — URINE CULTURE

## 2017-11-01 NOTE — Progress Notes (Signed)
Physical Therapy Treatment Patient Details Name: Lori Butler MRN: [id number] DOB: May 08, 1951 Today's Date: 11/01/2017    History of Present Illness 67 y.o. female with history of COPD and depression was brought to the ER after patient was found to be confused. Per MD,  SIRS/acute encephalopathy possibly medication related.    PT Comments    Patient progressing with ambulation and balance during functional tasks.  Remains high fall risk, however, due to lack of awareness of deficits and attempts to do things quicker and without assistance that she continues to struggle with.  Will continue to benefit from skilled PT during acute stay, and remains appropriate for SNF level rehab at d/c.   Follow Up Recommendations  SNF;Supervision/Assistance - 24 hour     Equipment Recommendations  None recommended by PT    Recommendations for Other Services       Precautions / Restrictions Precautions Precautions: Fall    Mobility  Bed Mobility               General bed mobility comments: up in recliner  Transfers   Equipment used: None Transfers: Sit to/from Stand Sit to Stand: Min guard         General transfer comment: for balance/safety  Ambulation/Gait Ambulation/Gait assistance: Min guard;Min assist Gait Distance (Feet): 200 Feet Assistive device: None Gait Pattern/deviations: Step-through pattern;Wide base of support;Shuffle     General Gait Details: waddling gait at times with hips externally rotated; some LOB noted with head turns and stepping around items during DGI   Stairs             Wheelchair Mobility    Modified Rankin (Stroke Patients Only)       Balance Overall balance assessment: Needs assistance   Sitting balance-Leahy Scale: Good       Standing balance-Leahy Scale: Fair Standing balance comment: washing hands at sink and attempting hygiene after toileting without UE support                 Standardized Balance  Assessment Standardized Balance Assessment : Dynamic Gait Index   Dynamic Gait Index Level Surface: Mild Impairment Change in Gait Speed: Mild Impairment Gait with Horizontal Head Turns: Mild Impairment Gait with Vertical Head Turns: Normal Gait and Pivot Turn: Normal Step Over Obstacle: Mild Impairment Step Around Obstacles: Mild Impairment      Cognition Arousal/Alertness: Awake/alert Behavior During Therapy: Flat affect   Area of Impairment: Safety/judgement                         Safety/Judgement: Decreased awareness of safety     General Comments: LOB with standing in bathroom for hygiene and unaware of safety issues; more concerned about cleaning up the "mess"      Exercises General Exercises - Lower Extremity Long Arc Quad: Strengthening;5 reps;Seated;Both Hip Flexion/Marching: Strengthening;10 reps;Seated;Both    General Comments        Pertinent Vitals/Pain Pain Assessment: No/denies pain    Home Living                      Prior Function            PT Goals (current goals can now be found in the care plan section) Progress towards PT goals: Progressing toward goals    Frequency    Min 3X/week      PT Plan Current plan remains appropriate    Co-evaluation  AM-PAC PT "6 Clicks" Daily Activity  Outcome Measure  Difficulty turning over in bed (including adjusting bedclothes, sheets and blankets)?: None Difficulty moving from lying on back to sitting on the side of the bed? : A Little Difficulty sitting down on and standing up from a chair with arms (e.g., wheelchair, bedside commode, etc,.)?: A Little Help needed moving to and from a bed to chair (including a wheelchair)?: A Little Help needed walking in hospital room?: A Little Help needed climbing 3-5 steps with a railing? : A Lot 6 Click Score: 18    End of Session Equipment Utilized During Treatment: Gait belt Activity Tolerance: Patient tolerated  treatment well Patient left: in chair;with chair alarm set   PT Visit Diagnosis: Unsteadiness on feet (R26.81);Ataxic gait (R26.0)     Time: 1330-1350 PT Time Calculation (min) (ACUTE ONLY): 20 min  Charges:  $Gait Training: 8-22 mins                    G CodesMagda Butler, Lori Butler 11/01/2017    Lori Butler 11/01/2017, 3:37 PM

## 2017-11-01 NOTE — Consult Note (Signed)
[facility] Consult   Reason for Consult:  AMS Referring Physician:  Dr. Maryland Pink  Patient Identification: Lori Butler MRN:  [id number] Principal Diagnosis: Altered mental status Diagnosis:   Patient Active Problem List   Diagnosis Date Noted  . SIRS (systemic inflammatory response syndrome) (HCC) [R65.10] 10/30/2017  . Acute encephalopathy [G93.40] 10/29/2017  . Tobacco abuse [Z72.0] 09/20/2017  . COPD (chronic obstructive pulmonary disease) ([facility]) [J44.9] 09/15/2017    Total Time spent with patient: 1 hour  Subjective:   Lori Butler is a 67 y.o. female patient admitted with AMS.  HPI:   Per chart review, patient was admitted with AMS. She was noted to be distracted and confused to her family. On admission, she was noted to be fidgety, anxious and endorsing intermittent hallucinations in the setting of SOB. There was concern that it was due to abruptly stopping Wellbutrin SR 100 mg daily and experiencing withdrawal effects.  She was empirically started on IV antibiotics for possible UTI. EEG and head MRI was unremarkable. BAL and UDS were negative on admission. Her mental status has improved since admission. She will be discharged to a SNF since her son is unable to care for her.   On interview, Lori Butler reports that she is feeling better.  She reports that she abruptly discontinued Wellbutrin at the end of April because she did not realize that she should have gradually tapered it.  She reports that she was taking it for smoking cessation.  She quit smoking on 5/1.  She reports onset of hallucinations and confusion prior to hospitalization.  She denies noticing any changes after discontinuing Wellbutrin.  She denies a prior psychiatric history.  She denies SI, HI or AVH.  She is oriented to person, place and time.  Past Psychiatric History: Denies   Risk to Self:  None. Denies SI. Risk to Others:  None. Denies HI. Prior Inpatient Therapy:  Denies  Prior  Outpatient Therapy:  Denies  Past Medical History:  Past Medical History:  Diagnosis Date  . Arthritis    "a little in my hands probably" (10/31/2017)  . COPD (chronic obstructive pulmonary disease) ([facility])   . History of kidney stones     Past Surgical History:  Procedure Laterality Date  . ABDOMINAL HYSTERECTOMY    . APPENDECTOMY    . BREAST BIOPSY Left 1990s  . CATARACT EXTRACTION W/ INTRAOCULAR LENS  IMPLANT, BILATERAL Bilateral   . DILATION AND CURETTAGE OF UTERUS    . TONSILLECTOMY    . TUBAL LIGATION     Family History:  Family History  Problem Relation Age of Onset  . Breast cancer Mother    Family Psychiatric  History: Denies  Social History:  Social History   Substance and Sexual Activity  Alcohol Use Not Currently   Comment: 10/31/2017 "couple drinks/year"     Social History   Substance and Sexual Activity  Drug Use Never    Social History   Socioeconomic History  . Marital status: Divorced    Spouse name: Not on file  . Number of children: Not on file  . Years of education: Not on file  . Highest education level: Not on file  Occupational History  . Not on file  Social Needs  . Financial resource strain: Not on file  . Food insecurity:    Worry: Not on file    Inability: Not on file  . Transportation needs:    Medical: Not on file    Non-medical: Not on file  Tobacco Use  . Smoking status: Former Smoker    Packs/day: 1.50    Years: 51.00    Pack years: 76.50    Types: Cigarettes    Last attempt to quit: 09/05/2017    Years since quitting: 0.1  . Smokeless tobacco: Never Used  Substance and Sexual Activity  . Alcohol use: Not Currently    Comment: 10/31/2017 "couple drinks/year"  . Drug use: Never  . Sexual activity: Not Currently  Lifestyle  . Physical activity:    Days per week: Not on file    Minutes per session: Not on file  . Stress: Not on file  Relationships  . Social connections:    Talks on phone: Not on file    Gets together:  Not on file    Attends religious service: Not on file    Active member of club or organization: Not on file    Attends meetings of clubs or organizations: Not on file    Relationship status: Not on file  Other Topics Concern  . Not on file  Social History Narrative  . Not on file   Additional Social History: She lives at home alone. She is divorced x 2. She divorced her second husband 20 years ago. She has a son and daughter. She denies illicit substance or alcohol use.     Allergies:   Allergies  Allergen Reactions  . Demerol [Meperidine] Other (See Comments)    "horrible reaction"  . Oxycodone-Acetaminophen Other (See Comments)    Upset stomach     Labs:  Results for orders placed or performed during the hospital encounter of 10/29/17 (from the past 48 hour(s))  Glucose, capillary     Status: None   Collection Time: 10/30/17 11:50 AM  Result Value Ref Range   Glucose-Capillary 71 70 - 99 mg/dL  Troponin I     Status: None   Collection Time: 10/30/17  3:17 PM  Result Value Ref Range   Troponin I <0.03 <0.03 ng/mL    Comment: Performed at [facility] [address] [place], [place] [address]  Glucose, capillary     Status: Abnormal   Collection Time: 10/30/17  4:11 PM  Result Value Ref Range   Glucose-Capillary 55 (L) 70 - 99 mg/dL  Glucose, capillary     Status: Abnormal   Collection Time: 10/30/17  4:57 PM  Result Value Ref Range   Glucose-Capillary 108 (H) 70 - 99 mg/dL  Troponin I (q 6hr x 3)     Status: None   Collection Time: 10/30/17  7:40 PM  Result Value Ref Range   Troponin I <0.03 <0.03 ng/mL    Comment: Performed at [facility], [place] [address] [place], [place] [address]  Glucose, capillary     Status: None   Collection Time: 10/30/17  8:24 PM  Result Value Ref Range   Glucose-Capillary 90 70 - 99 mg/dL  Glucose, capillary     Status: None   Collection Time: 10/31/17 12:20 AM  Result Value Ref Range   Glucose-Capillary 76 70 - 99 mg/dL   Glucose, capillary     Status: Abnormal   Collection Time: 10/31/17  4:27 AM  Result Value Ref Range   Glucose-Capillary 64 (L) 70 - 99 mg/dL  Culture, Urine     Status: Abnormal   Collection Time: 10/31/17  4:39 AM  Result Value Ref Range   Specimen Description URINE, CLEAN CATCH    Special Requests NONE  Culture (A)     <10,000 COLONIES/mL INSIGNIFICANT GROWTH Performed at [facility] [address] [place], [place] [address]    Report Status 11/01/2017 FINAL   Glucose, capillary     Status: Abnormal   Collection Time: 10/31/17  4:54 AM  Result Value Ref Range   Glucose-Capillary 103 (H) 70 - 99 mg/dL  CBC     Status: None   Collection Time: 10/31/17  6:25 AM  Result Value Ref Range   WBC 10.0 4.0 - 10.5 K/uL   RBC 4.20 3.87 - 5.11 MIL/uL   Hemoglobin 12.3 12.0 - 15.0 g/dL   HCT 38.8 36.0 - 46.0 %   MCV 92.4 78.0 - 100.0 fL   MCH 29.3 26.0 - 34.0 pg   MCHC 31.7 30.0 - 36.0 g/dL   RDW 13.7 11.5 - 15.5 %   Platelets 272 150 - 400 K/uL    Comment: Performed at [facility], [place]. [address], [place], [place] [address]  Basic metabolic panel     Status: Abnormal   Collection Time: 10/31/17  6:25 AM  Result Value Ref Range   Sodium 138 135 - 145 mmol/L   Potassium 4.2 3.5 - 5.1 mmol/L   Chloride 109 98 - 111 mmol/L    Comment: Please note change in reference range.   CO2 22 22 - 32 mmol/L   Glucose, Bld 97 70 - 99 mg/dL    Comment: Please note change in reference range.   BUN 17 8 - 23 mg/dL    Comment: Please note change in reference range.   Creatinine, Ser 0.67 0.44 - 1.00 mg/dL   Calcium 8.1 (L) 8.9 - 10.3 mg/dL   GFR calc non Af Amer >60 >60 mL/min   GFR calc Af Amer >60 >60 mL/min    Comment: (NOTE) The eGFR has been calculated using the CKD EPI equation. This calculation has not been validated in all clinical situations. eGFR's persistently <60 mL/min signify possible Chronic Kidney Disease.    Anion gap 7 5 - 15    Comment: Performed at  [facility] [address], [place], [place] [address]  Glucose, capillary     Status: None   Collection Time: 10/31/17  7:54 AM  Result Value Ref Range   Glucose-Capillary 90 70 - 99 mg/dL  Glucose, capillary     Status: None   Collection Time: 10/31/17 11:38 AM  Result Value Ref Range   Glucose-Capillary 84 70 - 99 mg/dL  Glucose, capillary     Status: None   Collection Time: 10/31/17  4:41 PM  Result Value Ref Range   Glucose-Capillary 77 70 - 99 mg/dL  Glucose, capillary     Status: Abnormal   Collection Time: 10/31/17  8:10 PM  Result Value Ref Range   Glucose-Capillary 120 (H) 70 - 99 mg/dL  Glucose, capillary     Status: Abnormal   Collection Time: 10/31/17 11:54 PM  Result Value Ref Range   Glucose-Capillary 100 (H) 70 - 99 mg/dL  Glucose, capillary     Status: None   Collection Time: 11/01/17  4:23 AM  Result Value Ref Range   Glucose-Capillary 96 70 - 99 mg/dL  Glucose, capillary     Status: None   Collection Time: 11/01/17  8:08 AM  Result Value Ref Range   Glucose-Capillary 98 70 - 99 mg/dL    Current Facility-Administered Medications  Medication Dose Route Frequency Provider Last Rate Last Dose  .  buPROPion (WELLBUTRIN SR) 12 hr tablet 100 mg  100 mg Oral Daily Bonnielee Haff, MD   100 mg at 11/01/17 0958  . cefTRIAXone (ROCEPHIN) 1 g in sodium chloride 0.9 % 100 mL IVPB  1 g Intravenous Q24H Elgergawy, Silver Huguenin, MD   Stopped at 10/31/17 1651  . feeding supplement (BOOST / RESOURCE BREEZE) liquid 1 Container  1 Container Oral TID BM Bonnielee Haff, MD   1 Container at 10/31/17 2059  . levalbuterol (XOPENEX) nebulizer solution 0.63 mg  0.63 mg Nebulization Q6H PRN Rise Patience, MD      . LORazepam (ATIVAN) injection 0.5 mg  0.5 mg Intravenous Q4H PRN Rise Patience, MD      . multivitamin with minerals tablet 1 tablet  1 tablet Oral Daily Bonnielee Haff, MD   1 tablet at 11/01/17 [phone]  . ondansetron (ZOFRAN) injection 4 mg  4 mg Intravenous  Q6H PRN Bonnielee Haff, MD        Musculoskeletal: Strength & Muscle Tone: within normal limits Gait & Station: UTA since patient is lying in bed. Patient leans: N/A  Psychiatric Specialty Exam: Physical Exam  Constitutional: She is oriented to person, place, and time. She appears well-developed and well-nourished.  HENT:  Head: Normocephalic and atraumatic.  Neck: Normal range of motion.  Respiratory: Effort normal.  Musculoskeletal: Normal range of motion.  Neurological: She is alert and oriented to person, place, and time.  Skin: No rash noted.  Psychiatric: She has a normal mood and affect. Her behavior is normal. Judgment and thought content normal.    Review of Systems  Constitutional: Negative for chills and fever.  Cardiovascular: Negative for chest pain.  Gastrointestinal: Negative for abdominal pain, diarrhea, heartburn, nausea and vomiting.  Psychiatric/Behavioral: Negative for depression, hallucinations, substance abuse and suicidal ideas. The patient does not have insomnia.   All other systems reviewed and are negative.   Blood pressure 135/72, pulse 86, temperature 98.8 F (37.1 C), temperature source Oral, resp. rate 18, height _0  (1.651 m), weight 70.3 kg (154 lb 15.7 oz), SpO2 95 %.Body mass index is 25.79 kg/m.  General Appearance: Fairly Groomed, elderly, Caucasian female, wearing a hospital gown with short, gray hair who is lying in bed. NAD.   Eye Contact:  Good  Speech:  Clear and Coherent and Normal Rate  Volume:  Normal  Mood:  Euthymic  Affect:  Appropriate and Congruent  Thought Process:  Goal Directed, Linear and Descriptions of Associations: Intact  Orientation:  Full (Time, Place, and Person)  Thought Content:  Logical  Suicidal Thoughts:  No  Homicidal Thoughts:  No  Memory:  Immediate;   Good Recent;   Good Remote;   Good  Judgement:  Good  Insight:  Good  Psychomotor Activity:  Normal  Concentration:  Concentration: Good and Attention  Span: Good  Recall:  Good  Fund of Knowledge:  Good  Language:  Good  Akathisia:  No  Handed:  Right  AIMS (if indicated):   N/A  Assets:  Communication Skills Desire for Improvement Housing Social Support  ADL's:  Intact  Cognition:  WNL  Sleep:   N/A   Assessment: Lori Butler is a 67 y.o. female who was admitted with AMS in the setting of possible UTI. She reports abruptly discontinuing Wellbutrin at the end of April so it is unlikely to have contributed to AMS unless she more recently stopped it given recent onset of symptoms. Her acute change in status is most likely related  to a medical condition such as the UTI. Wellbutrin can be discontinued if she in fact discontinued it two months ago. She denies SI, HI or AVH and does not appear to be responding to internal stimuli.   Treatment Plan Summary: -Wellbutrin is less likely to have caused AMS if discontinued two months ago given and recent onset of symptoms. Can discontinue medication if history provided is correct.  -Psychiatry will sign off on patient at this time. Please consult psychiatry again as needed.   Disposition: No evidence of imminent risk to self or others at present.   Patient does not meet criteria for psychiatric inpatient admission.  Faythe Dingwall, DO 11/01/2017 11:11 AM

## 2017-11-01 NOTE — Progress Notes (Signed)
[facility] PROGRESS NOTE  Lori Butler AQT:[id number] DOB: Jan 15, 1951 DOA: 10/29/2017  PCP: [name], MD  Brief History/Interval Summary: 67 year old Caucasian female with a past medical history of COPD and depression was brought into the hospital due to confusion.  Patient was noted to be distracted and confused for her family members.  Recently started on an inhaler but not on any oral medications.  Reason for Visit: Acute metabolic encephalopathy  Consultants: None  Procedures: EEG: No epileptiform activity noted  Antibiotics: None  Subjective/Interval History: Patient states that she feels better.  Denies any new complaints.  She mentions that she stopped taking the Wellbutrin since she quit smoking.  Did not discuss with any provider prior to discontinuing.  She had been on it for 2 years.    ROS: Denies any nausea or bowel  Objective:  Vital Signs  Vitals:   11/01/17 0428 11/01/17 0632 11/01/17 0800 11/01/17 1218  BP:  140/72 135/72 135/86  Pulse:  90 86 91  Resp:  18 18 18   Temp: 98 F (36.7 C) 98.8 F (37.1 C) 98.8 F (37.1 C) 99 F (37.2 C)  TempSrc: Oral Oral Oral Oral  SpO2:  95% 95% 94%  Weight:      Height:        Intake/Output Summary (Last 24 hours) at 11/01/2017 1237 Last data filed at 11/01/2017 1214 Gross per 24 hour  Intake 340 ml  Output 1375 ml  Net -1035 ml   Filed Weights   10/31/17 1400  Weight: 70.3 kg (154 lb 15.7 oz)    General appearance: Awake alert.  In no distress Resp: Clear to auscultation bilaterally Cardio: S1-S2 is normal regular.  No S3-S4.  No rubs murmurs or bruit GI: Abdomen soft.  Nontender nondistended.  Bowel sounds are present.  No masses organomegaly Neurologic: Awake alert.  Oriented to person place time.  No focal neurological deficits.    Lab Results:  Data Reviewed: I have personally reviewed following labs and imaging studies  CBC: Recent Labs  Lab 10/29/17 1901 10/30/17 0230  10/31/17 0625  WBC 15.4* 16.9* 10.0  NEUTROABS 13.6* 12.9*  --   HGB 13.1 12.6 12.3  HCT 40.0 39.1 38.8  MCV 90.3 91.8 92.4  PLT 305 264 562    Basic Metabolic Panel: Recent Labs  Lab 10/29/17 1901 10/30/17 0230 10/31/17 0625  NA 140 143 138  K 3.8 4.2 4.2  CL 103 109 109  CO2 22 21* 22  GLUCOSE 136* 60* 97  BUN 15 20 17   CREATININE 0.86 0.85 0.67  CALCIUM 8.9 8.6* 8.1*  MG 1.9 2.0  --     GFR: Estimated Creatinine Clearance: 66.2 mL/min (by C-G formula based on SCr of 0.67 mg/dL).  Liver Function Tests: Recent Labs  Lab 10/29/17 1901 10/30/17 0230  AST 35 31  ALT 46 44  ALKPHOS 138* 127*  BILITOT 3.4* 1.4*  PROT 7.3 6.9  ALBUMIN 4.1 4.1     Recent Labs  Lab 10/30/17 0230  AMMONIA 30    Cardiac Enzymes: Recent Labs  Lab 10/29/17 2048 10/30/17 0230 10/30/17 0831 10/30/17 1517 10/30/17 1940  CKTOTAL 200  --   --   --   --   TROPONINI  --  <0.03 <0.03 <0.03 <0.03    CBG: Recent Labs  Lab 10/31/17 1641 10/31/17 2010 10/31/17 2354 11/01/17 0423 11/01/17 0808  GLUCAP 77 120* 100* 96 98    Thyroid Function Tests: Recent Labs    10/30/17 0230  TSH 3.150     Recent Results (from the past 240 hour(s))  MRSA PCR Screening     Status: None   Collection Time: 10/30/17  5:21 AM  Result Value Ref Range Status   MRSA by PCR NEGATIVE NEGATIVE Final    Comment:        The GeneXpert MRSA Assay (FDA approved for NASAL specimens only), is one component of a comprehensive MRSA colonization surveillance program. It is not intended to diagnose MRSA infection nor to guide or monitor treatment for MRSA infections. Performed at [facility], [place] [address] [place], [place] [address]   Culture, blood (routine x 2)     Status: None (Preliminary result)   Collection Time: 10/30/17  8:13 AM  Result Value Ref Range Status   Specimen Description BLOOD LEFT WRIST  Final   Special Requests   Final    BOTTLES DRAWN AEROBIC AND ANAEROBIC Blood  Culture results may not be optimal due to an inadequate volume of blood received in culture bottles   Culture   Final    NO GROWTH 2 DAYS Performed at [facility], [place] [address], [place], [place] [address]    Report Status PENDING  Incomplete  Culture, blood (routine x 2)     Status: None (Preliminary result)   Collection Time: 10/30/17  8:30 AM  Result Value Ref Range Status   Specimen Description BLOOD RIGHT HAND  Final   Special Requests   Final    BOTTLES DRAWN AEROBIC ONLY Blood Culture results may not be optimal due to an inadequate volume of blood received in culture bottles   Culture   Final    NO GROWTH 2 DAYS Performed at [facility], [place] [address], [place], [place] [address]    Report Status PENDING  Incomplete  Culture, Urine     Status: Abnormal   Collection Time: 10/31/17  4:39 AM  Result Value Ref Range Status   Specimen Description URINE, CLEAN CATCH  Final   Special Requests NONE  Final   Culture (A)  Final    <10,000 COLONIES/mL INSIGNIFICANT GROWTH Performed at [facility], [address], [place],  [address]    Report Status 11/01/2017 FINAL  Final      Radiology Studies: Mr Brain Wo Contrast  Result Date: 10/30/2017 CLINICAL DATA:  Altered mental status EXAM: MRI HEAD WITHOUT CONTRAST TECHNIQUE: Multiplanar, multiecho pulse sequences of the brain and surrounding structures were obtained without intravenous contrast. COMPARISON:  Head CT 10/29/2017 FINDINGS: BRAIN: There is no acute infarct, acute hemorrhage or mass effect. The midline structures are normal. There are no old infarcts. Multifocal white matter hyperintensity, most commonly due to chronic ischemic microangiopathy. Enlarged perivascular space of the right lentiform nucleus. Susceptibility-sensitive sequences show no chronic microhemorrhage or superficial siderosis. VASCULAR: Major intracranial arterial and venous sinus flow voids are preserved. SKULL AND UPPER CERVICAL  SPINE: The visualized skull base, calvarium, upper cervical spine and extracranial soft tissues are normal. SINUSES/ORBITS: No fluid levels or advanced mucosal thickening. No mastoid or middle ear effusion. The orbits are normal. IMPRESSION: Mild chronic small vessel disease without acute intracranial abnormality Electronically Signed   By: Ulyses Jarred M.D.   On: 10/30/2017 18:56     Medications:  Scheduled: . buPROPion  100 mg Oral Daily  . feeding supplement  1 Container Oral TID BM  . multivitamin with minerals  1 tablet Oral Daily   Continuous: . cefTRIAXone (ROCEPHIN)  IV Stopped (10/31/17 1651)   [id number], LORazepam, ondansetron (ZOFRAN) IV  Assessment/Plan:    Acute metabolic encephalopathy Reason for her encephalopathy is not clear.  But based on information obtained yesterday it appears that she on her own decided to stop taking her Wellbutrin.  She thought it was okay to do since she quit smoking.  So her symptoms could have been due to withdrawal from Wellbutrin.  She was placed back on it yesterday.  Family requesting psychiatric evaluation which has been ordered.  MRI brain without any acute findings.  EEG without any epileptiform activity.  Ammonia and TSH levels were normal.  HIV nonreactive.  Will order vitamin B12 level for tomorrow if she is still here.  If not, this can be pursued as an outpatient..  Patient's mental status appears to have improved.  Could be close to baseline.    History of COPD Stable.  Was recently hospitalized for exacerbation.  Leukocytosis Resolved  Possible UTI UA was noted to be mildly abnormal.  Due to her encephalopathy and leukocytosis she was empirically started on IV antibiotics.  Urine cultures without any significant growth.  Can discontinue antibiotics after today.   DVT Prophylaxis: SCDs    Code Status: Full code Family Communication: Discussed with the patient. Disposition Plan: Management as outlined above.  Waiting  on placement to skilled nursing facility for short-term rehab.  Await psychiatry input.    LOS: 0 days   [facility] Pager [phone] 11/01/2017, 12:37 PM  If 7PM-7AM, please contact night-coverage at [email or web address], password [facility]

## 2017-11-02 DIAGNOSIS — J449 Chronic obstructive pulmonary disease, unspecified: Secondary | ICD-10-CM | POA: Diagnosis not present

## 2017-11-02 DIAGNOSIS — M810 Age-related osteoporosis without current pathological fracture: Secondary | ICD-10-CM | POA: Diagnosis not present

## 2017-11-02 DIAGNOSIS — Z7951 Long term (current) use of inhaled steroids: Secondary | ICD-10-CM | POA: Diagnosis not present

## 2017-11-02 DIAGNOSIS — R488 Other symbolic dysfunctions: Secondary | ICD-10-CM | POA: Diagnosis not present

## 2017-11-02 DIAGNOSIS — N390 Urinary tract infection, site not specified: Secondary | ICD-10-CM | POA: Diagnosis not present

## 2017-11-02 DIAGNOSIS — E538 Deficiency of other specified B group vitamins: Secondary | ICD-10-CM | POA: Diagnosis not present

## 2017-11-02 DIAGNOSIS — R6510 Systemic inflammatory response syndrome (SIRS) of non-infectious origin without acute organ dysfunction: Secondary | ICD-10-CM | POA: Diagnosis not present

## 2017-11-02 DIAGNOSIS — F329 Major depressive disorder, single episode, unspecified: Secondary | ICD-10-CM | POA: Diagnosis not present

## 2017-11-02 DIAGNOSIS — B9689 Other specified bacterial agents as the cause of diseases classified elsewhere: Secondary | ICD-10-CM | POA: Diagnosis not present

## 2017-11-02 DIAGNOSIS — Z79899 Other long term (current) drug therapy: Secondary | ICD-10-CM | POA: Diagnosis not present

## 2017-11-02 DIAGNOSIS — R2689 Other abnormalities of gait and mobility: Secondary | ICD-10-CM | POA: Diagnosis not present

## 2017-11-02 DIAGNOSIS — M6281 Muscle weakness (generalized): Secondary | ICD-10-CM | POA: Diagnosis not present

## 2017-11-02 DIAGNOSIS — G9341 Metabolic encephalopathy: Secondary | ICD-10-CM | POA: Diagnosis not present

## 2017-11-02 DIAGNOSIS — G9340 Encephalopathy, unspecified: Secondary | ICD-10-CM | POA: Diagnosis not present

## 2017-11-02 DIAGNOSIS — E559 Vitamin D deficiency, unspecified: Secondary | ICD-10-CM | POA: Diagnosis not present

## 2017-11-02 DIAGNOSIS — M2550 Pain in unspecified joint: Secondary | ICD-10-CM | POA: Diagnosis not present

## 2017-11-02 DIAGNOSIS — Z7401 Bed confinement status: Secondary | ICD-10-CM | POA: Diagnosis not present

## 2017-11-02 DIAGNOSIS — J439 Emphysema, unspecified: Secondary | ICD-10-CM | POA: Diagnosis not present

## 2017-11-02 DIAGNOSIS — Z87891 Personal history of nicotine dependence: Secondary | ICD-10-CM | POA: Diagnosis not present

## 2017-11-02 DIAGNOSIS — Z885 Allergy status to narcotic agent status: Secondary | ICD-10-CM | POA: Diagnosis not present

## 2017-11-02 LAB — GLUCOSE, CAPILLARY
GLUCOSE-CAPILLARY: 100 mg/dL — AB (ref 70–99)
GLUCOSE-CAPILLARY: 105 mg/dL — AB (ref 70–99)
GLUCOSE-CAPILLARY: 93 mg/dL (ref 70–99)
GLUCOSE-CAPILLARY: 97 mg/dL (ref 70–99)

## 2017-11-02 LAB — VITAMIN B12: Vitamin B-12: 171 pg/mL — ABNORMAL LOW (ref 180–914)

## 2017-11-02 MED ORDER — BUPROPION HCL ER (SR) 100 MG PO TB12: 100.0000 mg | ORAL_TABLET | Freq: Every day | ORAL | Status: DC

## 2017-11-02 MED ORDER — CYANOCOBALAMIN 1000 MCG/ML IJ SOLN: INTRAMUSCULAR | 0 refills | Status: DC

## 2017-11-02 MED ORDER — VITAMIN B-12 1000 MCG PO TABS: 1000.0000 ug | ORAL_TABLET | Freq: Every day | ORAL | Status: DC

## 2017-11-02 MED ORDER — FOLIC ACID 1 MG PO TABS: 1.0000 mg | ORAL_TABLET | Freq: Every day | ORAL | 3 refills | Status: DC

## 2017-11-02 MED ORDER — CYANOCOBALAMIN 1000 MCG/ML IJ SOLN
1000.0000 ug | Freq: Once | INTRAMUSCULAR | Status: AC
  Administered 2017-11-02: 1000 ug via INTRAMUSCULAR
  Filled 2017-11-02 (×2): qty 1

## 2017-11-02 NOTE — Progress Notes (Signed)
Stevan Born to be D/C'd Skilled nursing facility per MD order.  Discussed with the patient and all questions fully answered.  VSS, Skin clean, dry and intact without evidence of skin break down, no evidence of skin tears noted. IV catheter discontinued intact. Site without signs and symptoms of complications. Dressing and pressure applied.  An After Visit Summary was printed and given to the patient. Patient received prescription.  D/c education completed with patient/family including follow up instructions, medication list, d/c activities limitations if indicated, with other d/c instructions as indicated by MD - patient able to verbalize understanding, all questions fully answered.   Patient instructed to return to ED, call 911, or call MD for any changes in condition.   Patient escorted via [facility], and D/C home via private auto.  Luci Bank 11/02/2017 1:36 PM

## 2017-11-02 NOTE — Clinical Social Work Placement (Signed)
   CLINICAL SOCIAL WORK PLACEMENT  NOTE  Date:  11/02/2017  Patient Details  Name: Lori Butler MRN: [id number] Date of Birth: 1951-03-04  Clinical Social Work is seeking post-discharge placement for this patient at the Butler level of care (*CSW will initial, date and re-position this form in  chart as items are completed):  Yes   Patient/family provided with [facility] Work Department's list of facilities offering this level of care within the geographic area requested by the patient (or if unable, by the patient's family).  Yes   Patient/family informed of their freedom to choose among providers that offer the needed level of care, that participate in Medicare, Medicaid or managed care program needed by the patient, have an available bed and are willing to accept the patient.  Yes   Patient/family informed of [facility]'s ownership interest in [facility] and [facility], as well as of the fact that they are under no obligation to receive care at these facilities.  PASRR submitted to EDS on 10/31/17     PASRR number received on 10/31/17     Existing PASRR number confirmed on       FL2 transmitted to all facilities in geographic area requested by pt/family on 10/31/17     FL2 transmitted to all facilities within larger geographic area on       Patient informed that his/her managed care company has contracts with or will negotiate with certain facilities, including the following:        Yes   Patient/family informed of bed offers received.  Patient chooses bed at [facility]     Physician recommends and patient chooses bed at      Patient to be transferred to [facility] on 11/02/17.  Patient to be transferred to facility by [facility]     Patient family notified on 11/02/17 of transfer.  Name of family member notified:  Tommi Rumps, son     PHYSICIAN       Additional Comment:     _______________________________________________ Benard Halsted, [place] 11/02/2017, 12:25 PM

## 2017-11-02 NOTE — Progress Notes (Signed)
Patient will DC to: [facility] Anticipated DC date: 11/02/17 Family notified: Son, Tommi Rumps Transport by: PTAR 1:30pm   Per MD patient ready for DC to [facility]. RN, patient, patient's family, and facility notified of DC. Discharge Summary sent to facility. RN given number for report [phone]). DC packet on chart. Ambulance transport requested for patient.   CSW signing off.  Cedric Fishman, LCSW Clinical Social Worker [phone]

## 2017-11-02 NOTE — Discharge Instructions (Signed)
Vitamin B12 Deficiency Vitamin B12 deficiency means that your body is not getting enough vitamin B12. Your body needs vitamin B12 for important bodily functions. If you do not have enough vitamin B12 in your body, you can have health problems. Follow these instructions at home:  Take supplements only as told by your doctor. Follow the directions carefully.  Get any shots (injections) as told by your doctor. Do not miss your visits to the doctor.  Eat lots of healthy foods that contain vitamin B12. Ask your doctor if you should work with someone who is trained in how food affects health (dietitian). Foods that contain vitamin B12 include: ? Meat. ? Meat from birds (poultry). ? Fish. ? Eggs. ? Cereal and dairy products that are fortified. This means that vitamin B12 has been added to the food. Check the label on the package to see if the food is fortified.  Do not drink too much (do not abuse) alcohol.  Keep all follow-up visits as told by your doctor. This is important. Contact a doctor if:  Your symptoms come back. Get help right away if:  You have trouble breathing.  You have chest pain.  You get dizzy.  You pass out (lose consciousness). This information is not intended to replace advice given to you by your health care provider. Make sure you discuss any questions you have with your health care provider. Document Released: 04/13/2011 Document Revised: 09/30/2015 Document Reviewed: 09/09/2014 Elsevier Interactive Patient Education  2018 Elsevier Inc.  

## 2017-11-02 NOTE — Discharge Summary (Signed)
[facility]  Physician Discharge Summary   Patient ID: Lori Butler MRN: [id number] DOB/AGE: [birth year] 67 y.o.  Admit date: 10/29/2017 Discharge date: 11/02/2017  PCP: [name], MD  DISCHARGE DIAGNOSES:  Acute metabolic encephalopathy, now resolved COPD, stable Vitamin B12 deficiency   RECOMMENDATIONS FOR OUTPATIENT FOLLOW UP: 1. CBC and basic metabolic panel and folic acid level in 1 week 2. Please see below regarding vitamin B12 deficiency 3. Patient to discuss with PCP regarding tapering down Wellbutrin once she has completed rehab.   DISCHARGE CONDITION: fair  Diet recommendation: Regular diet  Filed Weights   10/31/17 1400  Weight: 70.3 kg (154 lb 15.7 oz)    INITIAL HISTORY: 67 year old Caucasian female with a past medical history of COPD and depression was brought into the hospital due to confusion.  Patient was noted to be distracted and confused for her family members.  Recently started on an inhaler but not on any oral medications.  Consultants:  Psychiatry  Procedures: EEG: No epileptiform activity noted   HOSPITAL COURSE:   Acute metabolic encephalopathy Reason for patient's encephalopathy was not entirely clear.  But based on information obtained from patient's son it appears that she on her own decided to stop taking her Wellbutrin.  She thought it was okay to do since she quit smoking.  So her symptoms could have been due to withdrawal from Wellbutrin.  She was placed back on it.  Patient was seen by psychiatry.  It remains unclear when exactly patient stopped taking her Wellbutrin.  It is not certain if that is entirely the reason for her confusion.  However we are recommending that she continue taking the Wellbutrin for now.  She will discuss this further with her PCP once she has completed rehab.  MRI brain did not show any acute findings.  EEG did not show any epileptiform activity.  Ammonia and TSH levels were normal.  HIV was nonreactive.   Vitamin B12 level was low.  Please see below.   Vitamin B12 deficiency The vitamin B12 level was checked and was noted to be 171.  She will be given vitamin B12 injections.  She will need to have a folic acid level checked but it can be done at the skilled nursing facility.  She will be given intramuscular vitamin B12 for 7 days following which she can take oral formulations.  She will need to have her vitamin B12 levels checked in a few weeks time.  History of COPD Stable.  Was recently hospitalized for exacerbation.  Leukocytosis Resolved  Possible UTI UA was noted to be mildly abnormal.  Due to her encephalopathy and leukocytosis she was empirically started on IV antibiotics.  Urine cultures without any significant growth.  Okay to stop her antibiotics now.  Overall stable.  Patient feels better.  Okay for discharge to skilled nursing facility for rehab.   PERTINENT LABS:  The results of significant diagnostics from this hospitalization (including imaging, microbiology, ancillary and laboratory) are listed below for reference.    Microbiology: Recent Results (from the past 240 hour(s))  MRSA PCR Screening     Status: None   Collection Time: 10/30/17  5:21 AM  Result Value Ref Range Status   MRSA by PCR NEGATIVE NEGATIVE Final    Comment:        The GeneXpert MRSA Assay (FDA approved for NASAL specimens only), is one component of a comprehensive MRSA colonization surveillance program. It is not intended to diagnose MRSA infection nor to guide or monitor  treatment for MRSA infections. Performed at [facility], [place] [address], [place], [place] [address]   Culture, blood (routine x 2)     Status: None (Preliminary result)   Collection Time: 10/30/17  8:13 AM  Result Value Ref Range Status   Specimen Description BLOOD LEFT WRIST  Final   Special Requests   Final    BOTTLES DRAWN AEROBIC AND ANAEROBIC Blood Culture results may not be optimal due to an inadequate  volume of blood received in culture bottles   Culture   Final    NO GROWTH 3 DAYS Performed at [facility], [place] [address] [place], [place] [address]    Report Status PENDING  Incomplete  Culture, blood (routine x 2)     Status: None (Preliminary result)   Collection Time: 10/30/17  8:30 AM  Result Value Ref Range Status   Specimen Description BLOOD RIGHT HAND  Final   Special Requests   Final    BOTTLES DRAWN AEROBIC ONLY Blood Culture results may not be optimal due to an inadequate volume of blood received in culture bottles   Culture   Final    NO GROWTH 3 DAYS Performed at [facility], [place] [address] [place], [place] [address]    Report Status PENDING  Incomplete  Culture, Urine     Status: Abnormal   Collection Time: 10/31/17  4:39 AM  Result Value Ref Range Status   Specimen Description URINE, CLEAN CATCH  Final   Special Requests NONE  Final   Culture (A)  Final    <10,000 COLONIES/mL INSIGNIFICANT GROWTH Performed at [facility], [address], [place], [place] [address]    Report Status 11/01/2017 FINAL  Final     Labs: Basic Metabolic Panel: Recent Labs  Lab 10/29/17 1901 10/30/17 0230 10/31/17 0625  NA 140 143 138  K 3.8 4.2 4.2  CL 103 109 109  CO2 22 21* 22  GLUCOSE 136* 60* 97  BUN 15 20 17   CREATININE 0.86 0.85 0.67  CALCIUM 8.9 8.6* 8.1*  MG 1.9 2.0  --    Liver Function Tests: Recent Labs  Lab 10/29/17 1901 10/30/17 0230  AST 35 31  ALT 46 44  ALKPHOS 138* 127*  BILITOT 3.4* 1.4*  PROT 7.3 6.9  ALBUMIN 4.1 4.1    Recent Labs  Lab 10/30/17 0230  AMMONIA 30   CBC: Recent Labs  Lab 10/29/17 1901 10/30/17 0230 10/31/17 0625  WBC 15.4* 16.9* 10.0  NEUTROABS 13.6* 12.9*  --   HGB 13.1 12.6 12.3  HCT 40.0 39.1 38.8  MCV 90.3 91.8 92.4  PLT 305 264 272   Cardiac Enzymes: Recent Labs  Lab 10/29/17 2048 10/30/17 0230 10/30/17 0831 10/30/17 1517 10/30/17 1940  CKTOTAL 200  --   --   --   --   TROPONINI   --  <0.03 <0.03 <0.03 <0.03   CBG: Recent Labs  Lab 11/01/17 1655 11/01/17 2037 11/01/17 2358 11/02/17 0432 11/02/17 0754  GLUCAP 84 102* 105* 97 93     IMAGING STUDIES Ct Head Wo Contrast  Result Date: 10/29/2017 CLINICAL DATA:  Altered mental status, anxiety, shortness of breath. EXAM: CT HEAD WITHOUT CONTRAST TECHNIQUE: Contiguous axial images were obtained from the base of the skull through the vertex without intravenous contrast. COMPARISON:  None. FINDINGS: Brain: Mild generalized volume loss with commensurate dilatation of the ventricles and sulci. Incidental note is made of a prominent perivascular space within  the lower portion of the RIGHT basal ganglia. Questionable small old lacunar infarcts within the basal ganglia regions and pons. There is no mass, hemorrhage, edema or other evidence of acute parenchymal abnormality. No extra-axial hemorrhage. Vascular: There are chronic calcified atherosclerotic changes of the large vessels at the skull base. No unexpected hyperdense vessel. Skull: Normal. Negative for fracture or focal lesion. Sinuses/Orbits: No acute finding. Other: None. IMPRESSION: 1. No acute findings.  No intracranial mass, hemorrhage or edema. 2. Questionable small old lacunar infarcts within the basal ganglia regions and pons. Electronically Signed   By: Franki Cabot M.D.   On: 10/29/2017 21:41   Mr Brain Wo Contrast  Result Date: 10/30/2017 CLINICAL DATA:  Altered mental status EXAM: MRI HEAD WITHOUT CONTRAST TECHNIQUE: Multiplanar, multiecho pulse sequences of the brain and surrounding structures were obtained without intravenous contrast. COMPARISON:  Head CT 10/29/2017 FINDINGS: BRAIN: There is no acute infarct, acute hemorrhage or mass effect. The midline structures are normal. There are no old infarcts. Multifocal white matter hyperintensity, most commonly due to chronic ischemic microangiopathy. Enlarged perivascular space of the right lentiform nucleus.  Susceptibility-sensitive sequences show no chronic microhemorrhage or superficial siderosis. VASCULAR: Major intracranial arterial and venous sinus flow voids are preserved. SKULL AND UPPER CERVICAL SPINE: The visualized skull base, calvarium, upper cervical spine and extracranial soft tissues are normal. SINUSES/ORBITS: No fluid levels or advanced mucosal thickening. No mastoid or middle ear effusion. The orbits are normal. IMPRESSION: Mild chronic small vessel disease without acute intracranial abnormality Electronically Signed   By: Ulyses Jarred M.D.   On: 10/30/2017 18:56   Dg Chest Port 1 View  Result Date: 10/30/2017 CLINICAL DATA:  67 year old female with altered mental status and systemic inflammatory response syndrome. EXAM: PORTABLE CHEST 1 VIEW COMPARISON:  Chest radiograph dated 09/16/2017 FINDINGS: There is emphysematous changes of the lungs with areas of biapical subpleural scarring. No focal consolidation, pleural effusion, or pneumothorax. The cardiac silhouette is within normal limits. No acute osseous pathology. IMPRESSION: No acute cardiopulmonary process. Emphysema. Electronically Signed   By: Anner Crete M.D.   On: 10/30/2017 02:14    DISCHARGE EXAMINATION: Vitals:   11/02/17 0051 11/02/17 0433 11/02/17 0438 11/02/17 0805  BP: 116/66 124/79  127/68  Pulse: 78 87 88 89  Resp: 17 18 15 15   Temp: 98.2 F (36.8 C) 98.5 F (36.9 C)  98.2 F (36.8 C)  TempSrc: Oral Oral  Oral  SpO2: 90% (!) 88% 100% 98%  Weight:      Height:       General appearance: alert, cooperative, appears stated age and no distress Resp: clear to auscultation bilaterally Cardio: regular rate and rhythm, S1, S2 normal, no murmur, click, rub or gallop GI: soft, non-tender; bowel sounds normal; no masses,  no organomegaly   DISPOSITION: SNF  Discharge Instructions    Call MD for:  extreme fatigue   Complete by:  As directed    Call MD for:  persistant dizziness or light-headedness   Complete  by:  As directed    Call MD for:  persistant nausea and vomiting   Complete by:  As directed    Call MD for:  severe uncontrolled pain   Complete by:  As directed    Call MD for:  temperature >100.4   Complete by:  As directed    Discharge instructions   Complete by:  As directed    Please review instructions on the discharge summary  You were cared for by a hospitalist  during your hospital stay. If you have any questions about your discharge medications or the care you received while you were in the hospital after you are discharged, you can call the unit and asked to speak with the hospitalist on call if the hospitalist that took care of you is not available. Once you are discharged, your primary care physician will handle any further medical issues. Please note that NO REFILLS for any discharge medications will be authorized once you are discharged, as it is imperative that you return to your primary care physician (or establish a relationship with a primary care physician if you do not have one) for your aftercare needs so that they can reassess your need for medications and monitor your lab values. If you do not have a primary care physician, you can call [phone] for a physician referral.   Increase activity slowly   Complete by:  As directed         Allergies as of 11/02/2017      Reactions   Demerol [meperidine] Other (See Comments)   "horrible reaction"   Oxycodone-acetaminophen Other (See Comments)   Upset stomach       Medication List    STOP taking these medications   naproxen sodium 220 MG tablet Commonly known as:  ALEVE   umeclidinium-vilanterol 62.5-25 MCG/INH Aepb Commonly known as:  ANORO ELLIPTA     TAKE these medications   acetaminophen 500 MG tablet Commonly known as:  TYLENOL Take 1,000 mg by mouth every 6 (six) hours as needed for mild pain or headache.   albuterol (2.5 MG/3ML) 0.083% nebulizer solution Commonly known as:  PROVENTIL Take 3 mLs (2.5  mg total) by nebulization every 4 (four) hours as needed for wheezing or shortness of breath.   albuterol 108 (90 Base) MCG/ACT inhaler Commonly known as:  PROVENTIL HFA;VENTOLIN HFA Inhale 2 puffs into the lungs every 6 (six) hours as needed for wheezing or shortness of breath.   alendronate 70 MG tablet Commonly known as:  FOSAMAX Take 70 mg by mouth once a week. On Wednesdays   buPROPion 100 MG 12 hr tablet Commonly known as:  WELLBUTRIN SR Take 1 tablet (100 mg total) by mouth daily. Start taking on:  11/03/2017   cyanocobalamin 1000 MCG/ML injection Commonly known as:  (VITAMIN B-12) Give 1000 mcg intramuscularly once daily for 7 days.   vitamin B-12 1000 MCG tablet Commonly known as:  CYANOCOBALAMIN Take 1 tablet (1,000 mcg total) by mouth daily. Please start after patient has received 7 days of IM vitamin B12. Start taking on:  11/09/2017   Fluticasone-Salmeterol 100-50 MCG/DOSE Aepb Commonly known as:  ADVAIR DISKUS Inhale 1 puff into the lungs 2 (two) times daily.   folic acid 1 MG tablet Commonly known as:  FOLVITE Take 1 tablet (1 mg total) by mouth daily.   guaiFENesin 600 MG 12 hr tablet Commonly known as:  MUCINEX Take 2 tablets (1,200 mg total) by mouth 2 (two) times daily.   tiotropium 18 MCG inhalation capsule Commonly known as:  SPIRIVA HANDIHALER Place 1 capsule (18 mcg total) into inhaler and inhale daily.   Vitamin D (Ergocalciferol) 50000 units Caps capsule Commonly known as:  DRISDOL Take 50,000 Units by mouth every 7 (seven) days.         Contact information for follow-up providers    [name], MD. Schedule an appointment as soon as possible for a visit in 2 week(s).   Specialty:  Family Medicine Contact information: [id number] [address] [phone]            Contact information for after-discharge care    Destination    [facility] .   Service:  Skilled Nursing Contact  information: [address] [place] [phone]                  TOTAL DISCHARGE TIME: 38 minutes  Bonnielee Haff  [facility] Office: [phone]  11/02/2017, 11:24 AM

## 2017-11-04 LAB — CULTURE, BLOOD (ROUTINE X 2)
CULTURE: NO GROWTH
Culture: NO GROWTH

## 2017-11-05 ENCOUNTER — Non-Acute Institutional Stay (SKILLED_NURSING_FACILITY): Payer: Medicare HMO | Admitting: Internal Medicine

## 2017-11-05 ENCOUNTER — Encounter: Payer: Self-pay | Admitting: Internal Medicine

## 2017-11-05 DIAGNOSIS — M810 Age-related osteoporosis without current pathological fracture: Secondary | ICD-10-CM

## 2017-11-05 DIAGNOSIS — J449 Chronic obstructive pulmonary disease, unspecified: Secondary | ICD-10-CM | POA: Diagnosis not present

## 2017-11-05 DIAGNOSIS — E559 Vitamin D deficiency, unspecified: Secondary | ICD-10-CM

## 2017-11-05 DIAGNOSIS — E538 Deficiency of other specified B group vitamins: Secondary | ICD-10-CM | POA: Diagnosis not present

## 2017-11-05 DIAGNOSIS — G9340 Encephalopathy, unspecified: Secondary | ICD-10-CM | POA: Diagnosis not present

## 2017-11-05 NOTE — Progress Notes (Signed)
:    Location:  [facility] [place] Room Number: 112-P Place of Service:  SNF [phone])   Hennie Duos, MD  PCP: [name], MD Patient Care Team: [name], MD as PCP - General (Family Medicine)  Extended Emergency Contact Information Primary Emergency Contact: Cathie Beams of [place] Phone: [phone] Relation: Relative Secondary Emergency Contact: Danity, [place] Mobile Phone: [phone] Relation: Son     Allergies: Demerol [meperidine] and Oxycodone-acetaminophen  Chief Complaint  Patient presents with  . New Admit To SNF    Admit to [facility]    HPI: Patient is 67 y.o. female with COPD and depression who presented to [facility] emergency department with confusion.  Patient was unable to to relay any useful information, is not sure what she takes.  She was admitted to [facility] from 6/20 4-28 where there was some concern for anticholinergic poisoning.  But based on information obtained from patient's son it appears that patient had decided to stop taking her Wellbutrin, she is out of the case since she quit smoking.  So her symptoms could have been due to withdrawal from Wellbutrin and patient was placed back on.  Patient is MRI of brain did not show any acute findings EEG was not abnormal, ammonia and TSH levels were normal, HIV was nonreactive and vitamin B12 was low.  Patient improved and is admitted to skilled nursing facility for OT/PT.  While at skilled nursing facility patient will be followed for vitamin D deficiency treated with replacement, osteoporosis treated with Fosamax and COPD treated with Advair and Mucinex and Spiriva.  Past Medical History:  Diagnosis Date  . Arthritis    "a little in my hands probably" (10/31/2017)  . COPD (chronic obstructive pulmonary disease) ([place])   . History of kidney stones     Past Surgical History:  Procedure Laterality Date  . ABDOMINAL HYSTERECTOMY    .  APPENDECTOMY    . BREAST BIOPSY Left 1990s  . CATARACT EXTRACTION W/ INTRAOCULAR LENS  IMPLANT, BILATERAL Bilateral   . DILATION AND CURETTAGE OF UTERUS    . TONSILLECTOMY    . TUBAL LIGATION      Allergies as of 11/05/2017      Reactions   Demerol [meperidine] Other (See Comments)   "horrible reaction"   Oxycodone-acetaminophen Other (See Comments)   Upset stomach       Medication List        Accurate as of 11/05/17 11:52 AM. Always use your most recent med list.          acetaminophen 500 MG tablet Commonly known as:  TYLENOL Take 1,000 mg by mouth every 6 (six) hours as needed for mild pain or headache.   albuterol (2.5 MG/3ML) 0.083% nebulizer solution Commonly known as:  PROVENTIL Take 3 mLs (2.5 mg total) by nebulization every 4 (four) hours as needed for wheezing or shortness of breath.   albuterol 108 (90 Base) MCG/ACT inhaler Commonly known as:  PROVENTIL HFA;VENTOLIN HFA Inhale 2 puffs into the lungs every 6 (six) hours as needed for wheezing or shortness of breath.   alendronate 70 MG tablet Commonly known as:  FOSAMAX Take 70 mg by mouth once a week. On Wednesdays   buPROPion 100 MG 12 hr tablet Commonly known as:  WELLBUTRIN SR Take 1 tablet (100 mg total) by mouth daily.   Fluticasone-Salmeterol 100-50 MCG/DOSE Aepb Commonly known as:  ADVAIR DISKUS Inhale 1 puff into the lungs  2 (two) times daily.   folic acid 1 MG tablet Commonly known as:  FOLVITE Take 1 tablet (1 mg total) by mouth daily.   guaiFENesin 600 MG 12 hr tablet Commonly known as:  MUCINEX Take 2 tablets (1,200 mg total) by mouth 2 (two) times daily.   tiotropium 18 MCG inhalation capsule Commonly known as:  SPIRIVA HANDIHALER Place 1 capsule (18 mcg total) into inhaler and inhale daily.   vitamin B-12 1000 MCG tablet Commonly known as:  CYANOCOBALAMIN Take 1 tablet (1,000 mcg total) by mouth daily. Please start after patient has received 7 days of IM vitamin B12. Start taking on:   11/09/2017   Vitamin D (Ergocalciferol) 50000 units Caps capsule Commonly known as:  DRISDOL Take 50,000 Units by mouth every 7 (seven) days.       No orders of the defined types were placed in this encounter.    There is no immunization history on file for this patient.  Social History   Tobacco Use  . Smoking status: Former Smoker    Packs/day: 1.50    Years: 51.00    Pack years: 76.50    Types: Cigarettes    Last attempt to quit: 09/05/2017    Years since quitting: 0.1  . Smokeless tobacco: Never Used  Substance Use Topics  . Alcohol use: Not Currently    Comment: 10/31/2017 "couple drinks/year"    Family history is   Family History  Problem Relation Age of Onset  . Breast cancer Mother       Review of Systems  DATA OBTAINED: from patient, nurse GENERAL:  no fevers, fatigue, appetite changes SKIN: No itching, or rash EYES: No eye pain, redness, discharge EARS: No earache, tinnitus, change in hearing NOSE: No congestion, drainage or bleeding  MOUTH/THROAT: No mouth or tooth pain, No sore throat RESPIRATORY: No cough, wheezing, SOB CARDIAC: No chest pain, palpitations, lower extremity edema  GI: No abdominal pain, No N/V/D or constipation, No heartburn or reflux  GU: No dysuria, frequency or urgency, or incontinence  MUSCULOSKELETAL: No unrelieved bone/joint pain NEUROLOGIC: No headache, dizziness or focal weakness PSYCHIATRIC: No c/o anxiety or sadness   Vitals:   11/05/17 1133  BP: 108/66  Pulse: 80  Resp: 18  Temp: 97.6 F (36.4 C)  SpO2: 94%    SpO2 Readings from Last 1 Encounters:  11/05/17 94%   Body mass index is 25.79 kg/m.     Physical Exam  GENERAL APPEARANCE: Alert, conversant,  No acute distress.  SKIN: No diaphoresis rash HEAD: Normocephalic, atraumatic  EYES: Conjunctiva/lids clear. Pupils round, reactive. EOMs intact.  EARS: External exam WNL, canals clear. Hearing grossly normal.  NOSE: No deformity or discharge.    MOUTH/THROAT: Lips w/o lesions  RESPIRATORY: Breathing is even, unlabored. Lung sounds are clear   CARDIOVASCULAR: Heart RRR no murmurs, rubs or gallops. No peripheral edema.   GASTROINTESTINAL: Abdomen is soft, non-tender, not distended w/ normal bowel sounds. GENITOURINARY: Bladder non tender, not distended  MUSCULOSKELETAL: No abnormal joints or musculature NEUROLOGIC:  Cranial nerves 2-12 grossly intact. Moves all extremities  PSYCHIATRIC: Mood and affect appropriate to situation, no behavioral issues  Patient Active Problem List   Diagnosis Date Noted  . Altered mental status   . SIRS (systemic inflammatory response syndrome) ([place]) 10/30/2017  . Acute encephalopathy 10/29/2017  . Tobacco abuse 09/20/2017  . COPD (chronic obstructive pulmonary disease) ([place]) 09/15/2017      Labs reviewed: Basic Metabolic Panel:    Component Value Date/Time  NA 138 10/31/2017 0625   K 4.2 10/31/2017 0625   CL 109 10/31/2017 0625   CO2 22 10/31/2017 0625   GLUCOSE 97 10/31/2017 0625   BUN 17 10/31/2017 0625   CREATININE 0.67 10/31/2017 0625   CALCIUM 8.1 (L) 10/31/2017 0625   PROT 6.9 10/30/2017 0230   ALBUMIN 4.1 10/30/2017 0230   AST 31 10/30/2017 0230   ALT 44 10/30/2017 0230   ALKPHOS 127 (H) 10/30/2017 0230   BILITOT 1.4 (H) 10/30/2017 0230   GFRNONAA >60 10/31/2017 0625   GFRAA >60 10/31/2017 0625    Recent Labs    10/29/17 1901 10/30/17 0230 10/31/17 0625  NA 140 143 138  K 3.8 4.2 4.2  CL 103 109 109  CO2 22 21* 22  GLUCOSE 136* 60* 97  BUN 15 20 17   CREATININE 0.86 0.85 0.67  CALCIUM 8.9 8.6* 8.1*  MG 1.9 2.0  --    Liver Function Tests: Recent Labs    09/16/17 0534 10/29/17 1901 10/30/17 0230  AST 21 35 31  ALT 23 46 44  ALKPHOS 107 138* 127*  BILITOT 0.5 3.4* 1.4*  PROT 7.0 7.3 6.9  ALBUMIN 3.8 4.1 4.1   No results for input(s): LIPASE, AMYLASE in the last 8760 hours. Recent Labs    10/30/17 0230  AMMONIA 30   CBC: Recent Labs     09/15/17 0917  10/29/17 1901 10/30/17 0230 10/31/17 0625  WBC 16.3*   < > 15.4* 16.9* 10.0  NEUTROABS 11.3*  --  13.6* 12.9*  --   HGB 14.4   < > 13.1 12.6 12.3  HCT 43.3   < > 40.0 39.1 38.8  MCV 92.1   < > 90.3 91.8 92.4  PLT 297   < > 305 264 272   < > = values in this interval not displayed.   Lipid No results for input(s): CHOL, HDL, LDLCALC, TRIG in the last 8760 hours.  Cardiac Enzymes: Recent Labs    10/29/17 2048  10/30/17 0831 10/30/17 1517 10/30/17 1940  CKTOTAL 200  --   --   --   --   TROPONINI  --    < > <0.03 <0.03 <0.03   < > = values in this interval not displayed.   BNP: No results for input(s): BNP in the last 8760 hours. No results found for: MICROALBUR No results found for: HGBA1C Lab Results  Component Value Date   TSH 3.150 10/30/2017   Lab Results  Component Value Date   VITAMINB12 171 (L) 11/02/2017   No results found for: FOLATE No results found for: IRON, TIBC, FERRITIN  Imaging and Procedures obtained prior to SNF admission: Ct Head Wo Contrast  Result Date: 10/29/2017 CLINICAL DATA:  Altered mental status, anxiety, shortness of breath. EXAM: CT HEAD WITHOUT CONTRAST TECHNIQUE: Contiguous axial images were obtained from the base of the skull through the vertex without intravenous contrast. COMPARISON:  None. FINDINGS: Brain: Mild generalized volume loss with commensurate dilatation of the ventricles and sulci. Incidental note is made of a prominent perivascular space within the lower portion of the RIGHT basal ganglia. Questionable small old lacunar infarcts within the basal ganglia regions and pons. There is no mass, hemorrhage, edema or other evidence of acute parenchymal abnormality. No extra-axial hemorrhage. Vascular: There are chronic calcified atherosclerotic changes of the large vessels at the skull base. No unexpected hyperdense vessel. Skull: Normal. Negative for fracture or focal lesion. Sinuses/Orbits: No acute finding. Other: None.  IMPRESSION: 1. No  acute findings.  No intracranial mass, hemorrhage or edema. 2. Questionable small old lacunar infarcts within the basal ganglia regions and pons. Electronically Signed   By: Franki Cabot M.D.   On: 10/29/2017 21:41   Mr Brain Wo Contrast  Result Date: 10/30/2017 CLINICAL DATA:  Altered mental status EXAM: MRI HEAD WITHOUT CONTRAST TECHNIQUE: Multiplanar, multiecho pulse sequences of the brain and surrounding structures were obtained without intravenous contrast. COMPARISON:  Head CT 10/29/2017 FINDINGS: BRAIN: There is no acute infarct, acute hemorrhage or mass effect. The midline structures are normal. There are no old infarcts. Multifocal white matter hyperintensity, most commonly due to chronic ischemic microangiopathy. Enlarged perivascular space of the right lentiform nucleus. Susceptibility-sensitive sequences show no chronic microhemorrhage or superficial siderosis. VASCULAR: Major intracranial arterial and venous sinus flow voids are preserved. SKULL AND UPPER CERVICAL SPINE: The visualized skull base, calvarium, upper cervical spine and extracranial soft tissues are normal. SINUSES/ORBITS: No fluid levels or advanced mucosal thickening. No mastoid or middle ear effusion. The orbits are normal. IMPRESSION: Mild chronic small vessel disease without acute intracranial abnormality Electronically Signed   By: Ulyses Jarred M.D.   On: 10/30/2017 18:56   Dg Chest Port 1 View  Result Date: 10/30/2017 CLINICAL DATA:  67 year old female with altered mental status and systemic inflammatory response syndrome. EXAM: PORTABLE CHEST 1 VIEW COMPARISON:  Chest radiograph dated 09/16/2017 FINDINGS: There is emphysematous changes of the lungs with areas of biapical subpleural scarring. No focal consolidation, pleural effusion, or pneumothorax. The cardiac silhouette is within normal limits. No acute osseous pathology. IMPRESSION: No acute cardiopulmonary process. Emphysema. Electronically Signed    By: Anner Crete M.D.   On: 10/30/2017 02:14     Not all labs, radiology exams or other studies done during hospitalization come through on my EPIC note; however they are reviewed by me.    Assessment and Plan  Acute metabolic encephalopathy- not entirely clear, possibly from withdrawal from Wellbutrin which patient stopped abruptly when she stopped smoking.  MRI of the brain did not show any acute findings, EEG did not show any seizure activity, ammonia and TSH were normal HIV was nonreactive, but vitamin B12 levels are low; patient was placed back on Wellbutrin for now SNF -patient is admitted for OT/PT; continue Wellbutrin 100 mg daily  Vitamin B12 deficiency- level was 171, she will be given B12 injections daily for 7 days then oral replacement SNF -B12 thousand micrograms per mill, 1 mill IM daily for 7 days then transition to 1000 mcg tablet p.o. Daily  COPD-stable SNF -continue Advair Diskus 1 puff twice daily, Spiriva 1 capsule daily and guaifenesin 1200 mg twice daily  Osteoporosis SNF -stable; no fractures; continue Fosamax 70 mg weekly  Vitamin D deficiency SNF -continue replacement 50,000 units weekly   Time spent greater than 45 minutes;> 50% of time with patient was spent reviewing records, labs, tests and studies, counseling and developing plan of care  Hennie Duos, MD

## 2017-11-06 LAB — CBC AND DIFFERENTIAL
HEMATOCRIT: 36 (ref 36–46)
HEMOGLOBIN: 11.5 — AB (ref 12.0–16.0)
PLATELETS: 265 (ref 150–399)
WBC: 9.7

## 2017-11-06 LAB — BASIC METABOLIC PANEL
BUN: 17 (ref 4–21)
Creatinine: 0.5 (ref 0.5–1.1)
GLUCOSE: 93
POTASSIUM: 4.6 (ref 3.4–5.3)
Sodium: 140 (ref 137–147)

## 2017-11-07 ENCOUNTER — Non-Acute Institutional Stay (SKILLED_NURSING_FACILITY): Payer: Medicare HMO | Admitting: Internal Medicine

## 2017-11-07 ENCOUNTER — Encounter: Payer: Self-pay | Admitting: Internal Medicine

## 2017-11-07 DIAGNOSIS — M810 Age-related osteoporosis without current pathological fracture: Secondary | ICD-10-CM

## 2017-11-07 DIAGNOSIS — E559 Vitamin D deficiency, unspecified: Secondary | ICD-10-CM

## 2017-11-07 DIAGNOSIS — J449 Chronic obstructive pulmonary disease, unspecified: Secondary | ICD-10-CM

## 2017-11-07 DIAGNOSIS — E538 Deficiency of other specified B group vitamins: Secondary | ICD-10-CM | POA: Diagnosis not present

## 2017-11-07 DIAGNOSIS — G9340 Encephalopathy, unspecified: Secondary | ICD-10-CM | POA: Diagnosis not present

## 2017-11-07 NOTE — Progress Notes (Signed)
Location:  [facility] Room Number: 112-P Place of Service:  SNF (31)  Noah Delaine. Sheppard Coil, MD  Patient Care Team: [name], MD as PCP - [facility])  Extended Emergency Contact Information Primary Emergency Contact: Flanary,Stacy  [place] Mobile Phone: [phone] Relation: Relative Secondary Emergency Contact: Matricia, Begnaud Mobile Phone: [phone] Relation: Son  Allergies  Allergen Reactions  . Demerol [Meperidine] Other (See Comments)    "horrible reaction"  . Oxycodone-Acetaminophen Other (See Comments)    Upset stomach     Chief Complaint  Patient presents with  . Discharge Note    Discharge from [facility]    HPI:  67 y.o. female with COPD and depression who presented to [facility] emergency department with confusion.  Patient was admitted to [facility] from 6/20 4-28 where it was felt that her mental status was probably from her abrupt stoppage of Wellbutrin which she did on her own when she quit smoking.  All other studies were normal except for her B12 level which was low.  Patient was admitted to skilled nursing facility for OT/PT and is now ready to be discharged to home.    Past Medical History:  Diagnosis Date  . Arthritis    "a little in my hands probably" (10/31/2017)  . COPD (chronic obstructive pulmonary disease) ([place])   . History of kidney stones     Past Surgical History:  Procedure Laterality Date  . ABDOMINAL HYSTERECTOMY    . APPENDECTOMY    . BREAST BIOPSY Left 1990s  . CATARACT EXTRACTION W/ INTRAOCULAR LENS  IMPLANT, BILATERAL Bilateral   . DILATION AND CURETTAGE OF UTERUS    . TONSILLECTOMY    . TUBAL LIGATION       reports that she quit smoking about 2 months ago. Her smoking use included cigarettes. She has a 76.50 pack-year smoking history. She has never used smokeless tobacco. She reports that she drank alcohol. She reports that she does not use  drugs. Social History   Socioeconomic History  . Marital status: Divorced    Spouse name: Not on file  . Number of children: Not on file  . Years of education: Not on file  . Highest education level: Not on file  Occupational History  . Not on file  Social Needs  . Financial resource strain: Not on file  . Food insecurity:    Worry: Not on file    Inability: Not on file  . Transportation needs:    Medical: Not on file    Non-medical: Not on file  Tobacco Use  . Smoking status: Former Smoker    Packs/day: 1.50    Years: 51.00    Pack years: 76.50    Types: Cigarettes    Last attempt to quit: 09/05/2017    Years since quitting: 0.1  . Smokeless tobacco: Never Used  Substance and Sexual Activity  . Alcohol use: Not Currently    Comment: 10/31/2017 "couple drinks/year"  . Drug use: Never  . Sexual activity: Not Currently  Lifestyle  . Physical activity:    Days per week: Not on file    Minutes per session: Not on file  . Stress: Not on file  Relationships  . Social connections:    Talks on phone: Not on file    Gets together: Not on file    Attends religious service: Not on file    Active member of club or organization: Not on  file    Attends meetings of clubs or organizations: Not on file    Relationship status: Not on file  . Intimate partner violence:    Fear of current or ex partner: Not on file    Emotionally abused: Not on file    Physically abused: Not on file    Forced sexual activity: Not on file  Other Topics Concern  . Not on file  Social History Narrative  . Not on file    Pertinent  Health Maintenance Due  Topic Date Due  . PNA vac Low Risk Adult (1 of 2 - PCV13) 12/08/2017 (Originally 09/26/2014)  . COLONOSCOPY  01/08/2018 (Originally 09/26/1999)  . INFLUENZA VACCINE  12/06/2017  . MAMMOGRAM  07/21/2019  . DEXA SCAN  Completed    Medications: Allergies as of 11/07/2017      Reactions   Demerol [meperidine] Other (See Comments)   "horrible  reaction"   Oxycodone-acetaminophen Other (See Comments)   Upset stomach       Medication List        Accurate as of 11/07/17 11:59 PM. Always use your most recent med list.          acetaminophen 500 MG tablet Commonly known as:  TYLENOL Take 1,000 mg by mouth every 6 (six) hours as needed for mild pain or headache.   albuterol (2.5 MG/3ML) 0.083% nebulizer solution Commonly known as:  PROVENTIL Take 3 mLs (2.5 mg total) by nebulization every 4 (four) hours as needed for wheezing or shortness of breath.   albuterol 108 (90 Base) MCG/ACT inhaler Commonly known as:  PROVENTIL HFA;VENTOLIN HFA Inhale 2 puffs into the lungs every 6 (six) hours as needed for wheezing or shortness of breath.   alendronate 70 MG tablet Commonly known as:  FOSAMAX Take 70 mg by mouth once a week. On Wednesdays   buPROPion 100 MG 12 hr tablet Commonly known as:  WELLBUTRIN SR Take 1 tablet (100 mg total) by mouth daily.   Fluticasone-Salmeterol 100-50 MCG/DOSE Aepb Commonly known as:  ADVAIR DISKUS Inhale 1 puff into the lungs 2 (two) times daily.   folic acid 1 MG tablet Commonly known as:  FOLVITE Take 1 tablet (1 mg total) by mouth daily.   guaiFENesin 600 MG 12 hr tablet Commonly known as:  MUCINEX Take 2 tablets (1,200 mg total) by mouth 2 (two) times daily.   tiotropium 18 MCG inhalation capsule Commonly known as:  SPIRIVA HANDIHALER Place 1 capsule (18 mcg total) into inhaler and inhale daily.   vitamin B-12 1000 MCG tablet Commonly known as:  CYANOCOBALAMIN Take 1 tablet (1,000 mcg total) by mouth daily. Please start after patient has received 7 days of IM vitamin B12.   Vitamin D (Ergocalciferol) 50000 units Caps capsule Commonly known as:  DRISDOL Take 50,000 Units by mouth every 7 (seven) days.        Vitals:   11/07/17 1041  BP: (!) 98/58  Pulse: 81  Resp: 18  Temp: (!) 97 F (36.1 C)  Weight: 155 lb (70.3 kg)  Height: 5\' 5"  (1.651 m)   Body mass index is 25.79  kg/m.  Physical Exam  GENERAL APPEARANCE: Alert, conversant. No acute distress.  HEENT: Unremarkable. RESPIRATORY: Breathing is even, unlabored. Lung sounds are clear   CARDIOVASCULAR: Heart RRR no murmurs, rubs or gallops. No peripheral edema.  GASTROINTESTINAL: Abdomen is soft, non-tender, not distended w/ normal bowel sounds.  NEUROLOGIC: Cranial nerves 2-12 grossly intact. Moves all extremities   Labs reviewed:  Basic Metabolic Panel: Recent Labs    10/29/17 1901 10/30/17 0230 10/31/17 0625 11/06/17  NA 140 143 138 140  K 3.8 4.2 4.2 4.6  CL 103 109 109  --   CO2 22 21* 22  --   GLUCOSE 136* 60* 97  --   BUN 15 20 17 17   CREATININE 0.86 0.85 0.67 0.5  CALCIUM 8.9 8.6* 8.1*  --   MG 1.9 2.0  --   --    No results found for: [facility] Liver Function Tests: Recent Labs    09/16/17 0534 10/29/17 1901 10/30/17 0230  AST 21 35 31  ALT 23 46 44  ALKPHOS 107 138* 127*  BILITOT 0.5 3.4* 1.4*  PROT 7.0 7.3 6.9  ALBUMIN 3.8 4.1 4.1   No results for input(s): LIPASE, AMYLASE in the last 8760 hours. Recent Labs    10/30/17 0230  AMMONIA 30   CBC: Recent Labs    09/15/17 0917  10/29/17 1901 10/30/17 0230 10/31/17 0625 11/06/17  WBC 16.3*   < > 15.4* 16.9* 10.0 9.7  NEUTROABS 11.3*  --  13.6* 12.9*  --   --   HGB 14.4   < > 13.1 12.6 12.3 11.5*  HCT 43.3   < > 40.0 39.1 38.8 36  MCV 92.1   < > 90.3 91.8 92.4  --   PLT 297   < > 305 264 272 265   < > = values in this interval not displayed.   Lipid No results for input(s): CHOL, HDL, LDLCALC, TRIG in the last 8760 hours. Cardiac Enzymes: Recent Labs    10/29/17 2048  10/30/17 0831 10/30/17 1517 10/30/17 1940  CKTOTAL 200  --   --   --   --   TROPONINI  --    < > <0.03 <0.03 <0.03   < > = values in this interval not displayed.   BNP: No results for input(s): BNP in the last 8760 hours. CBG: Recent Labs    11/02/17 0432 11/02/17 0754 11/02/17 1205  GLUCAP 97 93 100*    Procedures and Imaging  Studies During Stay: Ct Head Wo Contrast  Result Date: 10/29/2017 CLINICAL DATA:  Altered mental status, anxiety, shortness of breath. EXAM: CT HEAD WITHOUT CONTRAST TECHNIQUE: Contiguous axial images were obtained from the base of the skull through the vertex without intravenous contrast. COMPARISON:  None. FINDINGS: Brain: Mild generalized volume loss with commensurate dilatation of the ventricles and sulci. Incidental note is made of a prominent perivascular space within the lower portion of the RIGHT basal ganglia. Questionable small old lacunar infarcts within the basal ganglia regions and pons. There is no mass, hemorrhage, edema or other evidence of acute parenchymal abnormality. No extra-axial hemorrhage. Vascular: There are chronic calcified atherosclerotic changes of the large vessels at the skull base. No unexpected hyperdense vessel. Skull: Normal. Negative for fracture or focal lesion. Sinuses/Orbits: No acute finding. Other: None. IMPRESSION: 1. No acute findings.  No intracranial mass, hemorrhage or edema. 2. Questionable small old lacunar infarcts within the basal ganglia regions and pons. Electronically Signed   By: Franki Cabot M.D.   On: 10/29/2017 21:41   Mr Brain Wo Contrast  Result Date: 10/30/2017 CLINICAL DATA:  Altered mental status EXAM: MRI HEAD WITHOUT CONTRAST TECHNIQUE: Multiplanar, multiecho pulse sequences of the brain and surrounding structures were obtained without intravenous contrast. COMPARISON:  Head CT 10/29/2017 FINDINGS: BRAIN: There is no acute infarct, acute hemorrhage or mass effect. The midline structures are normal.  There are no old infarcts. Multifocal white matter hyperintensity, most commonly due to chronic ischemic microangiopathy. Enlarged perivascular space of the right lentiform nucleus. Susceptibility-sensitive sequences show no chronic microhemorrhage or superficial siderosis. VASCULAR: Major intracranial arterial and venous sinus flow voids are  preserved. SKULL AND UPPER CERVICAL SPINE: The visualized skull base, calvarium, upper cervical spine and extracranial soft tissues are normal. SINUSES/ORBITS: No fluid levels or advanced mucosal thickening. No mastoid or middle ear effusion. The orbits are normal. IMPRESSION: Mild chronic small vessel disease without acute intracranial abnormality Electronically Signed   By: Ulyses Jarred M.D.   On: 10/30/2017 18:56   Dg Chest Port 1 View  Result Date: 10/30/2017 CLINICAL DATA:  67 year old female with altered mental status and systemic inflammatory response syndrome. EXAM: PORTABLE CHEST 1 VIEW COMPARISON:  Chest radiograph dated 09/16/2017 FINDINGS: There is emphysematous changes of the lungs with areas of biapical subpleural scarring. No focal consolidation, pleural effusion, or pneumothorax. The cardiac silhouette is within normal limits. No acute osseous pathology. IMPRESSION: No acute cardiopulmonary process. Emphysema. Electronically Signed   By: Anner Crete M.D.   On: 10/30/2017 02:14    Assessment/Plan:   Acute encephalopathy  Vitamin B12 deficiency  Chronic obstructive pulmonary disease, unspecified COPD type ([place])  Osteoporosis without current pathological fracture, unspecified osteoporosis type  Vitamin D deficiency   Patient is being discharged with the following home health services: OT/PT  Patient is being discharged with the following durable medical equipment: Rolling walker  Patient has been advised to f/u with their PCP in 1-2 weeks to bring them up to date on their rehab stay.  Social services at facility was responsible for arranging this appointment.  Pt was provided with a 30 day supply of prescriptions for medications and refills must be obtained from their PCP.  For controlled substances, a more limited supply may be provided adequate until PCP appointment only.  Medications have been reconciled.  And spent greater than 30 minutes;> 50% of time with patient  was spent reviewing records, labs, tests and studies, counseling and developing plan of care  Noah Delaine. Sheppard Coil, MD

## 2017-11-10 ENCOUNTER — Encounter: Payer: Self-pay | Admitting: Internal Medicine

## 2017-11-10 DIAGNOSIS — E559 Vitamin D deficiency, unspecified: Secondary | ICD-10-CM | POA: Insufficient documentation

## 2017-11-10 DIAGNOSIS — M810 Age-related osteoporosis without current pathological fracture: Secondary | ICD-10-CM | POA: Insufficient documentation

## 2017-11-10 DIAGNOSIS — E538 Deficiency of other specified B group vitamins: Secondary | ICD-10-CM | POA: Insufficient documentation

## 2017-11-10 HISTORY — DX: Vitamin D deficiency, unspecified: E55.9

## 2017-11-10 HISTORY — DX: Deficiency of other specified B group vitamins: E53.8

## 2017-11-11 DIAGNOSIS — D519 Vitamin B12 deficiency anemia, unspecified: Secondary | ICD-10-CM | POA: Diagnosis not present

## 2017-11-11 DIAGNOSIS — Z7951 Long term (current) use of inhaled steroids: Secondary | ICD-10-CM | POA: Diagnosis not present

## 2017-11-11 DIAGNOSIS — J449 Chronic obstructive pulmonary disease, unspecified: Secondary | ICD-10-CM | POA: Diagnosis not present

## 2017-11-11 DIAGNOSIS — Z87891 Personal history of nicotine dependence: Secondary | ICD-10-CM | POA: Diagnosis not present

## 2017-11-11 DIAGNOSIS — M1990 Unspecified osteoarthritis, unspecified site: Secondary | ICD-10-CM | POA: Diagnosis not present

## 2017-11-15 DIAGNOSIS — Z87891 Personal history of nicotine dependence: Secondary | ICD-10-CM | POA: Diagnosis not present

## 2017-11-15 DIAGNOSIS — D519 Vitamin B12 deficiency anemia, unspecified: Secondary | ICD-10-CM | POA: Diagnosis not present

## 2017-11-15 DIAGNOSIS — Z7951 Long term (current) use of inhaled steroids: Secondary | ICD-10-CM | POA: Diagnosis not present

## 2017-11-15 DIAGNOSIS — J449 Chronic obstructive pulmonary disease, unspecified: Secondary | ICD-10-CM | POA: Diagnosis not present

## 2017-11-15 DIAGNOSIS — M1990 Unspecified osteoarthritis, unspecified site: Secondary | ICD-10-CM | POA: Diagnosis not present

## 2017-11-16 DIAGNOSIS — D519 Vitamin B12 deficiency anemia, unspecified: Secondary | ICD-10-CM | POA: Diagnosis not present

## 2017-11-16 DIAGNOSIS — M1990 Unspecified osteoarthritis, unspecified site: Secondary | ICD-10-CM | POA: Diagnosis not present

## 2017-11-16 DIAGNOSIS — Z7951 Long term (current) use of inhaled steroids: Secondary | ICD-10-CM | POA: Diagnosis not present

## 2017-11-16 DIAGNOSIS — Z87891 Personal history of nicotine dependence: Secondary | ICD-10-CM | POA: Diagnosis not present

## 2017-11-16 DIAGNOSIS — J449 Chronic obstructive pulmonary disease, unspecified: Secondary | ICD-10-CM | POA: Diagnosis not present

## 2017-11-19 DIAGNOSIS — Z87891 Personal history of nicotine dependence: Secondary | ICD-10-CM | POA: Diagnosis not present

## 2017-11-19 DIAGNOSIS — D519 Vitamin B12 deficiency anemia, unspecified: Secondary | ICD-10-CM | POA: Diagnosis not present

## 2017-11-19 DIAGNOSIS — J449 Chronic obstructive pulmonary disease, unspecified: Secondary | ICD-10-CM | POA: Diagnosis not present

## 2017-11-19 DIAGNOSIS — M1990 Unspecified osteoarthritis, unspecified site: Secondary | ICD-10-CM | POA: Diagnosis not present

## 2017-11-19 DIAGNOSIS — Z7951 Long term (current) use of inhaled steroids: Secondary | ICD-10-CM | POA: Diagnosis not present

## 2017-11-22 DIAGNOSIS — D519 Vitamin B12 deficiency anemia, unspecified: Secondary | ICD-10-CM | POA: Diagnosis not present

## 2017-11-22 DIAGNOSIS — Z87891 Personal history of nicotine dependence: Secondary | ICD-10-CM | POA: Diagnosis not present

## 2017-11-22 DIAGNOSIS — M1990 Unspecified osteoarthritis, unspecified site: Secondary | ICD-10-CM | POA: Diagnosis not present

## 2017-11-22 DIAGNOSIS — J449 Chronic obstructive pulmonary disease, unspecified: Secondary | ICD-10-CM | POA: Diagnosis not present

## 2017-11-22 DIAGNOSIS — Z7951 Long term (current) use of inhaled steroids: Secondary | ICD-10-CM | POA: Diagnosis not present

## 2017-11-26 DIAGNOSIS — Z87891 Personal history of nicotine dependence: Secondary | ICD-10-CM | POA: Diagnosis not present

## 2017-11-26 DIAGNOSIS — E538 Deficiency of other specified B group vitamins: Secondary | ICD-10-CM | POA: Diagnosis not present

## 2017-11-26 DIAGNOSIS — E559 Vitamin D deficiency, unspecified: Secondary | ICD-10-CM | POA: Diagnosis not present

## 2017-11-26 DIAGNOSIS — G9341 Metabolic encephalopathy: Secondary | ICD-10-CM | POA: Diagnosis not present

## 2017-11-26 DIAGNOSIS — Z6825 Body mass index (BMI) 25.0-25.9, adult: Secondary | ICD-10-CM | POA: Diagnosis not present

## 2017-11-28 DIAGNOSIS — Z1211 Encounter for screening for malignant neoplasm of colon: Secondary | ICD-10-CM | POA: Diagnosis not present

## 2017-12-09 ENCOUNTER — Inpatient Hospital Stay (HOSPITAL_COMMUNITY)
Admission: EM | Admit: 2017-12-09 | Discharge: 2017-12-11 | DRG: 190 | Disposition: A | Discharge status: Home Health | Payer: Medicare HMO | Attending: Internal Medicine | Admitting: Internal Medicine

## 2017-12-09 ENCOUNTER — Emergency Department (HOSPITAL_COMMUNITY): Payer: Medicare HMO

## 2017-12-09 ENCOUNTER — Other Ambulatory Visit: Payer: Self-pay

## 2017-12-09 ENCOUNTER — Encounter (HOSPITAL_COMMUNITY): Payer: Self-pay | Admitting: Emergency Medicine

## 2017-12-09 DIAGNOSIS — Z7983 Long term (current) use of bisphosphonates: Secondary | ICD-10-CM | POA: Diagnosis not present

## 2017-12-09 DIAGNOSIS — Z7951 Long term (current) use of inhaled steroids: Secondary | ICD-10-CM

## 2017-12-09 DIAGNOSIS — Z87891 Personal history of nicotine dependence: Secondary | ICD-10-CM

## 2017-12-09 DIAGNOSIS — J209 Acute bronchitis, unspecified: Secondary | ICD-10-CM | POA: Diagnosis not present

## 2017-12-09 DIAGNOSIS — R0602 Shortness of breath: Secondary | ICD-10-CM

## 2017-12-09 DIAGNOSIS — T380X5A Adverse effect of glucocorticoids and synthetic analogues, initial encounter: Secondary | ICD-10-CM | POA: Diagnosis not present

## 2017-12-09 DIAGNOSIS — J441 Chronic obstructive pulmonary disease with (acute) exacerbation: Principal | ICD-10-CM | POA: Diagnosis present

## 2017-12-09 DIAGNOSIS — I700 Atherosclerosis of aorta: Secondary | ICD-10-CM | POA: Diagnosis not present

## 2017-12-09 DIAGNOSIS — E559 Vitamin D deficiency, unspecified: Secondary | ICD-10-CM | POA: Diagnosis present

## 2017-12-09 DIAGNOSIS — D72829 Elevated white blood cell count, unspecified: Secondary | ICD-10-CM | POA: Diagnosis present

## 2017-12-09 DIAGNOSIS — Y92230 Patient room in hospital as the place of occurrence of the external cause: Secondary | ICD-10-CM | POA: Diagnosis not present

## 2017-12-09 DIAGNOSIS — F319 Bipolar disorder, unspecified: Secondary | ICD-10-CM | POA: Diagnosis present

## 2017-12-09 DIAGNOSIS — M810 Age-related osteoporosis without current pathological fracture: Secondary | ICD-10-CM | POA: Diagnosis present

## 2017-12-09 DIAGNOSIS — E538 Deficiency of other specified B group vitamins: Secondary | ICD-10-CM | POA: Diagnosis not present

## 2017-12-09 DIAGNOSIS — F419 Anxiety disorder, unspecified: Secondary | ICD-10-CM | POA: Diagnosis not present

## 2017-12-09 DIAGNOSIS — I10 Essential (primary) hypertension: Secondary | ICD-10-CM | POA: Diagnosis not present

## 2017-12-09 DIAGNOSIS — R739 Hyperglycemia, unspecified: Secondary | ICD-10-CM | POA: Diagnosis present

## 2017-12-09 DIAGNOSIS — J9601 Acute respiratory failure with hypoxia: Secondary | ICD-10-CM | POA: Diagnosis present

## 2017-12-09 DIAGNOSIS — Z886 Allergy status to analgesic agent status: Secondary | ICD-10-CM

## 2017-12-09 DIAGNOSIS — R0689 Other abnormalities of breathing: Secondary | ICD-10-CM | POA: Diagnosis not present

## 2017-12-09 DIAGNOSIS — Z79899 Other long term (current) drug therapy: Secondary | ICD-10-CM | POA: Diagnosis not present

## 2017-12-09 DIAGNOSIS — R069 Unspecified abnormalities of breathing: Secondary | ICD-10-CM | POA: Diagnosis not present

## 2017-12-09 DIAGNOSIS — Z720 Tobacco use: Secondary | ICD-10-CM | POA: Diagnosis present

## 2017-12-09 DIAGNOSIS — D649 Anemia, unspecified: Secondary | ICD-10-CM | POA: Diagnosis not present

## 2017-12-09 DIAGNOSIS — Z885 Allergy status to narcotic agent status: Secondary | ICD-10-CM

## 2017-12-09 DIAGNOSIS — R001 Bradycardia, unspecified: Secondary | ICD-10-CM | POA: Diagnosis not present

## 2017-12-09 DIAGNOSIS — F329 Major depressive disorder, single episode, unspecified: Secondary | ICD-10-CM | POA: Diagnosis not present

## 2017-12-09 HISTORY — DX: Atherosclerosis of aorta: I70.0

## 2017-12-09 HISTORY — DX: Acute respiratory failure with hypoxia: J96.01

## 2017-12-09 HISTORY — DX: Chronic obstructive pulmonary disease with (acute) exacerbation: J44.1

## 2017-12-09 LAB — I-STAT VENOUS BLOOD GAS, ED
Acid-base deficit: 2 mmol/L (ref 0.0–2.0)
Bicarbonate: 24.6 mmol/L (ref 20.0–28.0)
O2 SAT: 47 %
PO2 VEN: 28 mmHg — AB (ref 32.0–45.0)
TCO2: 26 mmol/L (ref 22–32)
pCO2, Ven: 47.5 mmHg (ref 44.0–60.0)
pH, Ven: 7.323 (ref 7.250–7.430)

## 2017-12-09 LAB — BASIC METABOLIC PANEL
ANION GAP: 14 (ref 5–15)
BUN: 13 mg/dL (ref 8–23)
CO2: 23 mmol/L (ref 22–32)
CREATININE: 0.69 mg/dL (ref 0.44–1.00)
Calcium: 9 mg/dL (ref 8.9–10.3)
Chloride: 100 mmol/L (ref 98–111)
GFR calc non Af Amer: >60 mL/min (ref >60)
Glucose, Bld: 120 mg/dL — ABNORMAL HIGH (ref 70–99)
Potassium: 4 mmol/L (ref 3.5–5.1)
Sodium: 137 mmol/L (ref 135–145)

## 2017-12-09 LAB — CBC
HCT: 39.1 % (ref 36.0–46.0)
Hemoglobin: 12.5 g/dL (ref 12.0–15.0)
MCH: 29.8 pg (ref 26.0–34.0)
MCHC: 32 g/dL (ref 30.0–36.0)
MCV: 93.1 fL (ref 78.0–100.0)
PLATELETS: 312 10*3/uL (ref 150–400)
RBC: 4.2 MIL/uL (ref 3.87–5.11)
RDW: 13 % (ref 11.5–15.5)
WBC: 10.5 10*3/uL (ref 4.0–10.5)

## 2017-12-09 LAB — I-STAT TROPONIN, ED: TROPONIN I, POC: 0.01 ng/mL (ref 0.00–0.08)

## 2017-12-09 MED ORDER — ENOXAPARIN SODIUM 40 MG/0.4ML ~~LOC~~ SOLN
40.0000 mg | SUBCUTANEOUS | Status: DC
  Administered 2017-12-09 – 2017-12-11 (×3): 40 mg via SUBCUTANEOUS
  Filled 2017-12-09 (×3): qty 0.4

## 2017-12-09 MED ORDER — FLUTICASONE FUROATE-VILANTEROL 100-25 MCG/INH IN AEPB
1.0000 | INHALATION_SPRAY | Freq: Every day | RESPIRATORY_TRACT | Status: DC
  Administered 2017-12-10 – 2017-12-11 (×2): 1 via RESPIRATORY_TRACT
  Filled 2017-12-09: qty 28

## 2017-12-09 MED ORDER — IOPAMIDOL (ISOVUE-370) INJECTION 76%
INTRAVENOUS | Status: AC
  Administered 2017-12-09: 100 mL
  Filled 2017-12-09: qty 100

## 2017-12-09 MED ORDER — IPRATROPIUM-ALBUTEROL 0.5-2.5 (3) MG/3ML IN SOLN
3.0000 mL | Freq: Once | RESPIRATORY_TRACT | Status: AC
  Administered 2017-12-09: 3 mL via RESPIRATORY_TRACT
  Filled 2017-12-09: qty 3

## 2017-12-09 MED ORDER — IPRATROPIUM-ALBUTEROL 0.5-2.5 (3) MG/3ML IN SOLN
3.0000 mL | RESPIRATORY_TRACT | Status: AC
  Administered 2017-12-09 (×4): 3 mL via RESPIRATORY_TRACT
  Filled 2017-12-09 (×5): qty 3

## 2017-12-09 MED ORDER — METHYLPREDNISOLONE SODIUM SUCC 125 MG IJ SOLR
125.0000 mg | Freq: Once | INTRAMUSCULAR | Status: AC
  Administered 2017-12-09: 125 mg via INTRAVENOUS
  Filled 2017-12-09: qty 2

## 2017-12-09 MED ORDER — LORAZEPAM 2 MG/ML IJ SOLN
0.5000 mg | Freq: Once | INTRAMUSCULAR | Status: AC
  Administered 2017-12-09: 0.5 mg via INTRAVENOUS
  Filled 2017-12-09: qty 1

## 2017-12-09 MED ORDER — SODIUM CHLORIDE 0.9 % IV SOLN
Freq: Once | INTRAVENOUS | Status: AC
  Administered 2017-12-09: 10 mL/h via INTRAVENOUS

## 2017-12-09 MED ORDER — METHYLPREDNISOLONE SODIUM SUCC 125 MG IJ SOLR
60.0000 mg | Freq: Four times a day (QID) | INTRAMUSCULAR | Status: DC
  Administered 2017-12-09 – 2017-12-11 (×8): 60 mg via INTRAVENOUS
  Filled 2017-12-09 (×8): qty 2

## 2017-12-09 MED ORDER — METHYLPREDNISOLONE SODIUM SUCC 125 MG IJ SOLR: 60.0000 mg | Freq: Four times a day (QID) | INTRAMUSCULAR | Status: DC

## 2017-12-09 MED ORDER — MAGNESIUM SULFATE 2 GM/50ML IV SOLN
2.0000 g | Freq: Once | INTRAVENOUS | Status: AC
  Administered 2017-12-09: 2 g via INTRAVENOUS
  Filled 2017-12-09: qty 50

## 2017-12-09 NOTE — H&P (Addendum)
History and Physical    Lori Butler IZT:[id number] DOB: 09-08-1950 DOA: 12/09/2017  PCP: [name], MD Patient coming from: home  Chief Complaint: sob  HPI: Lori Butler is a 67 y.o. female with medical history significant COPD not on home oxygen, tobacco use, osteoporosis, vitamin D deficiency, depression presents emergency Department chief complaint worsening shortness of breath. Initial evaluation reveals acute respiratory failure with hypoxia likely secondary to a COPD exacerbation. [facility] are asked to admit.  Information is obtained from the patient. She states she's not had a cigarette since May. During May of this year she was hospitalized for COPD exacerbation. She has since seen pulmonology and had pulmonary function tests revealing moderate lung disease. She reports yesterday she felt a little "twinge" of shortness of breath but she used her nebulizers with good improvement. She woke up around 4 AM found herself with worsening shortness of breath. Use her nebulizer but got no relief. Associated symptoms include a chest "tightness" particular with coughing increased cough with increased sputum production. She describes the sputum is thick yellow. She states that chest tightness is located left anterior with coughing nonradiating. She denies headache dizziness syncope or near-syncope. She denies fever chills nausea vomiting dysuria hematuria frequency or urgency. She denies diarrhea constipation melena bright red blood per rectum. She does report long history of orthopnea and has been using a 3 pillow propped up.   ED Course: in the emergency department she's afebrile hemodynamically stable with a blood pressure high end of normal. She's had 3 nebulizers and 125 mg of Solu-Medrol. She is ambulated in the emergency department oxygen saturation level dropped 86%.  Review of Systems: As per HPI otherwise all other systems reviewed and are negative.   Ambulatory  Status:ambulates independently is independent with ADLs she works part-time at [employer]  Past Medical History:  Diagnosis Date  . Acute respiratory failure with hypoxia ([place])   . Aortic atherosclerosis ([place])   . Arthritis    "a little in my hands probably" (10/31/2017)  . COPD (chronic obstructive pulmonary disease) ([place])   . COPD exacerbation ([place])   . History of kidney stones     Past Surgical History:  Procedure Laterality Date  . ABDOMINAL HYSTERECTOMY    . APPENDECTOMY    . BREAST BIOPSY Left 1990s  . CATARACT EXTRACTION W/ INTRAOCULAR LENS  IMPLANT, BILATERAL Bilateral   . DILATION AND CURETTAGE OF UTERUS    . TONSILLECTOMY    . TUBAL LIGATION      Social History   Socioeconomic History  . Marital status: Divorced    Spouse name: Not on file  . Number of children: Not on file  . Years of education: Not on file  . Highest education level: Not on file  Occupational History  . Not on file  Social Needs  . Financial resource strain: Not on file  . Food insecurity:    Worry: Not on file    Inability: Not on file  . Transportation needs:    Medical: Not on file    Non-medical: Not on file  Tobacco Use  . Smoking status: Former Smoker    Packs/day: 1.50    Years: 51.00    Pack years: 76.50    Types: Cigarettes    Last attempt to quit: 09/05/2017    Years since quitting: 0.2  . Smokeless tobacco: Never Used  Substance and Sexual Activity  . Alcohol use: Not Currently    Comment: 10/31/2017 "couple drinks/year"  . Drug use:  Never  . Sexual activity: Not Currently  Lifestyle  . Physical activity:    Days per week: Not on file    Minutes per session: Not on file  . Stress: Not on file  Relationships  . Social connections:    Talks on phone: Not on file    Gets together: Not on file    Attends religious service: Not on file    Active member of club or organization: Not on file    Attends meetings of clubs or organizations: Not on file    Relationship status:  Not on file  . Intimate partner violence:    Fear of current or ex partner: Not on file    Emotionally abused: Not on file    Physically abused: Not on file    Forced sexual activity: Not on file  Other Topics Concern  . Not on file  Social History Narrative  . Not on file    Allergies  Allergen Reactions  . Demerol [Meperidine] Other (See Comments)    "horrible reaction"  . Oxycodone-Acetaminophen Other (See Comments)    Upset stomach     Family History  Problem Relation Age of Onset  . Breast cancer Mother     Prior to Admission medications   Medication Sig Start Date End Date Taking? Authorizing Provider  acetaminophen (TYLENOL) 500 MG tablet Take 1,000 mg by mouth every 6 (six) hours as needed for mild pain or headache.    [provider]  albuterol (PROVENTIL HFA;VENTOLIN HFA) 108 (90 Base) MCG/ACT inhaler Inhale 2 puffs into the lungs every 6 (six) hours as needed for wheezing or shortness of breath. 09/16/17   Sheikh, Georgina Quint Latif, DO  albuterol (PROVENTIL) (2.5 MG/3ML) 0.083% nebulizer solution Take 3 mLs (2.5 mg total) by nebulization every 4 (four) hours as needed for wheezing or shortness of breath. 09/16/17   Raiford Noble Latif, DO  alendronate (FOSAMAX) 70 MG tablet Take 70 mg by mouth once a week. On Wednesdays 07/11/17   [provider]  buPROPion (WELLBUTRIN SR) 100 MG 12 hr tablet Take 1 tablet (100 mg total) by mouth daily. 11/03/17   Bonnielee Haff, MD  Fluticasone-Salmeterol (ADVAIR DISKUS) 100-50 MCG/DOSE AEPB Inhale 1 puff into the lungs 2 (two) times daily. 09/16/17 09/16/18  Raiford Noble Latif, DO  folic acid (FOLVITE) 1 MG tablet Take 1 tablet (1 mg total) by mouth daily. 11/02/17 11/02/18  Bonnielee Haff, MD  guaiFENesin (MUCINEX) 600 MG 12 hr tablet Take 2 tablets (1,200 mg total) by mouth 2 (two) times daily. 09/16/17   Sheikh, Omair Latif, DO  tiotropium (SPIRIVA HANDIHALER) 18 MCG inhalation capsule Place 1 capsule (18 mcg total) into  inhaler and inhale daily. 09/16/17 09/16/18  Raiford Noble Latif, DO  vitamin B-12 (CYANOCOBALAMIN) 1000 MCG tablet Take 1 tablet (1,000 mcg total) by mouth daily. Please start after patient has received 7 days of IM vitamin B12. 11/09/17   Bonnielee Haff, MD  Vitamin D, Ergocalciferol, (DRISDOL) 50000 units CAPS capsule Take 50,000 Units by mouth every 7 (seven) days.    [provider]    Physical Exam: Vitals:   12/09/17 0830 12/09/17 0845 12/09/17 0900 12/09/17 0913  BP: (!) 143/68 (!) 128/58 (!) 141/67   Pulse: 96 96 94   Resp: 20 20 (!) 24   Temp:      TempSrc:      SpO2: 91% 90% 90% (!) 85%  Weight:      Height:  General:  Appears somewhat anxious, thin frail mild distress Eyes:  PERRL, EOMI, normal lids, iris ENT:  grossly normal hearing, lips & tongue, mucous membranes of her mouth are pink slightly dry Neck:  no LAD, masses or thyromegaly Cardiovascular:  Tachycardic but regular, no m/r/g. No LE edema.  Respiratory:  Moderate increased work of breathing with conversation. Has some difficulty completing sentences. Breath sounds are quite diminished throughout I hear no wheeze or crackles Abdomen:  soft, ntnd, positive bowel sounds throughout no guarding or rebounding Skin:  no rash or induration seen on limited exam Musculoskeletal:  grossly normal tone BUE/BLE, good ROM, no bony abnormality Psychiatric:  grossly normal mood and affect, speech fluent and appropriate, AOx3 Neurologic:  CN 2-12 grossly intact, moves all extremities in coordinated fashion, sensation intact, speech clear facial symmetry  Labs on Admission: I have personally reviewed following labs and imaging studies  CBC: Recent Labs  Lab 12/09/17 0544  WBC 10.5  HGB 12.5  HCT 39.1  MCV 93.1  PLT 419   Basic Metabolic Panel: Recent Labs  Lab 12/09/17 0544  NA 137  K 4.0  CL 100  CO2 23  GLUCOSE 120*  BUN 13  CREATININE 0.69  CALCIUM 9.0   GFR: Estimated Creatinine Clearance:  60.6 mL/min (by C-G formula based on SCr of 0.69 mg/dL). Liver Function Tests: No results for input(s): AST, ALT, ALKPHOS, BILITOT, PROT, ALBUMIN in the last 168 hours. No results for input(s): LIPASE, AMYLASE in the last 168 hours. No results for input(s): AMMONIA in the last 168 hours. Coagulation Profile: No results for input(s): INR, PROTIME in the last 168 hours. Cardiac Enzymes: No results for input(s): CKTOTAL, CKMB, CKMBINDEX, TROPONINI in the last 168 hours. BNP (last 3 results) No results for input(s): PROBNP in the last 8760 hours. HbA1C: No results for input(s): HGBA1C in the last 72 hours. CBG: No results for input(s): GLUCAP in the last 168 hours. Lipid Profile: No results for input(s): CHOL, HDL, LDLCALC, TRIG, CHOLHDL, LDLDIRECT in the last 72 hours. Thyroid Function Tests: No results for input(s): TSH, T4TOTAL, FREET4, T3FREE, THYROIDAB in the last 72 hours. Anemia Panel: No results for input(s): VITAMINB12, FOLATE, FERRITIN, TIBC, IRON, RETICCTPCT in the last 72 hours. Urine analysis:    Component Value Date/Time   COLORURINE YELLOW 10/30/2017 [place] 10/30/2017 1015   LABSPEC 1.026 10/30/2017 1015   PHURINE 5.0 10/30/2017 1015   GLUCOSEU NEGATIVE 10/30/2017 1015   HGBUR NEGATIVE 10/30/2017 1015   BILIRUBINUR NEGATIVE 10/30/2017 1015   KETONESUR 20 (A) 10/30/2017 1015   PROTEINUR NEGATIVE 10/30/2017 1015   UROBILINOGEN 1.0 07/12/2012 1842   NITRITE NEGATIVE 10/30/2017 1015   LEUKOCYTESUR SMALL (A) 10/30/2017 1015    Creatinine Clearance: Estimated Creatinine Clearance: 60.6 mL/min (by C-G formula based on SCr of 0.69 mg/dL).  Sepsis Labs: @LABRCNTIP (procalcitonin:4,lacticidven:4) )No results found for this or any previous visit (from the past 240 hour(s)).   Radiological Exams on Admission: Dg Chest 2 View  Result Date: 12/09/2017 CLINICAL DATA:  Shortness of breath. EXAM: CHEST - 2 VIEW COMPARISON:  Radiograph 10/30/2017 FINDINGS:  Chronic hyperinflation and bronchial thickening with biapical pleuroparenchymal scarring. The cardiomediastinal contours are normal. Pulmonary vasculature is normal. No consolidation, pleural effusion, or pneumothorax. No acute osseous abnormalities are seen. IMPRESSION: 1. Chronic hyperinflation and bronchial thickening, imaging findings consistent with COPD. 2. No acute chest findings. Electronically Signed   By: Jeb Levering M.D.   On: 12/09/2017 06:32   Ct Angio Chest Pe  W/cm &/or Wo Cm  Result Date: 12/09/2017 CLINICAL DATA:  67 year old female with acute onset shortness of breath. EXAM: CT ANGIOGRAPHY CHEST WITH CONTRAST TECHNIQUE: Multidetector CT imaging of the chest was performed using the standard protocol during bolus administration of intravenous contrast. Multiplanar CT image reconstructions and MIPs were obtained to evaluate the vascular anatomy. CONTRAST:  114mL ISOVUE-370 IOPAMIDOL (ISOVUE-370) INJECTION 76% COMPARISON:  Chest x-ray obtained earlier today; most recent prior chest CT 09/15/2017 FINDINGS: Cardiovascular: Adequate opacification of the pulmonary arteries to the proximal segmental level. No evidence of central filling defect to suggest acute pulmonary embolus. The main pulmonary artery is normal in size. There is mild fusiform dilation of the tubular portion of the ascending thoracic aorta, however it remains normal in size. The aortic root is also normal in size. Trace atherosclerotic calcifications. No evidence of dissection. The heart is normal in size. No pericardial effusion. Mediastinum/Nodes: Unremarkable CT appearance of the thyroid gland. No suspicious mediastinal or hilar adenopathy. No soft tissue mediastinal mass. The thoracic esophagus is unremarkable. Lungs/Pleura: Biapical pleuroparenchymal scarring. Moderately severe centrilobular pulmonary emphysema. Moderate respiratory motion artifact. No suspicious nodule or mass. Upper Abdomen: No acute abnormality.  Musculoskeletal: No chest wall abnormality. No acute or significant osseous findings. Review of the MIP images confirms the above findings. IMPRESSION: 1. Negative for acute pulmonary embolus, pneumonia or other acute cardiopulmonary process. Aortic Atherosclerosis (ICD10-I70.0) and Emphysema (ICD10-J43.9). Electronically Signed   By: Jacqulynn Cadet M.D.   On: 12/09/2017 10:13    EKG: Independently reviewed. Sinus tachycardia borderline right axis devisation similar to previous from 10/30/17  Assessment/Plan Principal Problem:   Acute respiratory failure with hypoxia (H[facility]) Active Problems:   COPD exacerbation ([facility])   Tobacco abuse   Vitamin B12 deficiency   #1. Acute respiratory failure with hypoxia likely secondary to a COPD exacerbation.CT of the chest negative for acute pulmonary embolus, pneumonia or other acute cardiopulmonary process. Chest x-ray reveals chronic hyperinflation and bronchial thickening consistent with COPD. Oxygen saturation level 85% on room air with ambulation. Ration is not on home oxygen. She is provided with oxygen supplementation nebulizers and Solu-Medrol. At time of admission oxygen saturation level 97% on 2 L nasal cannula. -admit -Continue oxygen supplementation -Scheduled nebs -Solu-Medrol -antitussive -Monitor oxygen saturation level -Wean oxygen as able  #2. COPD exacerbation. Hospitalized in May for the same. She is followed by [facility]. She smoked about a pack a day for 53 years and quit in May of this year. Chart review indicates PFTs reveal moderate airway obstruction, ratio 59, FEV1 51%, FVC 65%, no significant zygomatic response, DLCO 57%. Most recent visit note indicates high risk for relapse. Recommending pulmonary rehabilitation. Lung function 53%. -See #1 -Follow-up with pulmonary -Set up pulmonary rehabilitation  #3. Anxiety. Patient is quite anxious. Home meds include Wellbutrin -continue welbutrin -prn xanax  #4. Tobacco use.  Patient stopped smoking in May 2019    DVT prophylaxis: lovenox  Code Status: full  Family Communication: none present  Disposition Plan: home  Consults called: none  Admission status: inpatient    Radene Gunning NP [facility]  If 7PM-7AM, please contact night-coverage [email or web address] Password [email or web address]  12/09/2017, 11:06 AM

## 2017-12-09 NOTE — ED Notes (Signed)
Communicated critical result for [facility] V[facility] @1057  with RN Luellen Pucker.

## 2017-12-09 NOTE — ED Notes (Signed)
Patient transported to X-ray 

## 2017-12-09 NOTE — ED Triage Notes (Signed)
Pt reports shortness of breath since 0400 today. Took one of her own neb treatments and EMS gave 10mg  albuterol with mild relief. Denies pain at this time, reports tightness in chest.

## 2017-12-09 NOTE — ED Provider Notes (Signed)
[facility] EMERGENCY DEPARTMENT Provider Note   CSN: [id number] Arrival date & time: 12/09/17  0533     History   Chief Complaint Chief Complaint  Patient presents with  . Shortness of Breath    HPI Lori Butler is a 67 y.o. female who presents with shortness of breath.  Past medical history significant for COPD.  She states that she felt a little short of breath yesterday and is gradually worsened this morning.  She reports associated wheezing a little bit of a cough.  She tried home nebulizers without relief therefore called EMS.  EMS gave her multiple breathing treatments as well and states that she does feel less short of breath now but still has some difficulty breathing and chest tightness.  She denies fever, syncope, chest pain, abdominal pain, leg swelling.  She denies cardiac history. She states she stopped smoking in May of this year.  HPI  Past Medical History:  Diagnosis Date  . Aortic atherosclerosis ([place])   . Arthritis    "a little in my hands probably" (10/31/2017)  . COPD (chronic obstructive pulmonary disease) ([place])   . History of kidney stones     Patient Active Problem List   Diagnosis Date Noted  . Vitamin B12 deficiency 11/10/2017  . Osteoporosis 11/10/2017  . Vitamin D deficiency 11/10/2017  . Altered mental status   . SIRS (systemic inflammatory response syndrome) ([place]) 10/30/2017  . Acute encephalopathy 10/29/2017  . Tobacco abuse 09/20/2017  . COPD (chronic obstructive pulmonary disease) ([place]) 09/15/2017    Past Surgical History:  Procedure Laterality Date  . ABDOMINAL HYSTERECTOMY    . APPENDECTOMY    . BREAST BIOPSY Left 1990s  . CATARACT EXTRACTION W/ INTRAOCULAR LENS  IMPLANT, BILATERAL Bilateral   . DILATION AND CURETTAGE OF UTERUS    . TONSILLECTOMY    . TUBAL LIGATION       OB History   None      Home Medications    Prior to Admission medications   Medication Sig Start Date End Date Taking? Authorizing  Provider  acetaminophen (TYLENOL) 500 MG tablet Take 1,000 mg by mouth every 6 (six) hours as needed for mild pain or headache.    [provider]  albuterol (PROVENTIL HFA;VENTOLIN HFA) 108 (90 Base) MCG/ACT inhaler Inhale 2 puffs into the lungs every 6 (six) hours as needed for wheezing or shortness of breath. 09/16/17   Sheikh, Georgina Quint Latif, DO  albuterol (PROVENTIL) (2.5 MG/3ML) 0.083% nebulizer solution Take 3 mLs (2.5 mg total) by nebulization every 4 (four) hours as needed for wheezing or shortness of breath. 09/16/17   Raiford Noble Latif, DO  alendronate (FOSAMAX) 70 MG tablet Take 70 mg by mouth once a week. On Wednesdays 07/11/17   [provider]  buPROPion (WELLBUTRIN SR) 100 MG 12 hr tablet Take 1 tablet (100 mg total) by mouth daily. 11/03/17   Bonnielee Haff, MD  Fluticasone-Salmeterol (ADVAIR DISKUS) 100-50 MCG/DOSE AEPB Inhale 1 puff into the lungs 2 (two) times daily. 09/16/17 09/16/18  Raiford Noble Latif, DO  folic acid (FOLVITE) 1 MG tablet Take 1 tablet (1 mg total) by mouth daily. 11/02/17 11/02/18  Bonnielee Haff, MD  guaiFENesin (MUCINEX) 600 MG 12 hr tablet Take 2 tablets (1,200 mg total) by mouth 2 (two) times daily. 09/16/17   Sheikh, Omair Latif, DO  tiotropium (SPIRIVA HANDIHALER) 18 MCG inhalation capsule Place 1 capsule (18 mcg total) into inhaler and inhale daily. 09/16/17 09/16/18  Kerney Elbe, DO  vitamin B-12 (CYANOCOBALAMIN) 1000 MCG tablet Take 1 tablet (1,000 mcg total) by mouth daily. Please start after patient has received 7 days of IM vitamin B12. 11/09/17   Bonnielee Haff, MD  Vitamin D, Ergocalciferol, (DRISDOL) 50000 units CAPS capsule Take 50,000 Units by mouth every 7 (seven) days.    [provider]    Family History Family History  Problem Relation Age of Onset  . Breast cancer Mother     Social History Social History   Tobacco Use  . Smoking status: Former Smoker    Packs/day: 1.50    Years: 51.00    Pack years:  76.50    Types: Cigarettes    Last attempt to quit: 09/05/2017    Years since quitting: 0.2  . Smokeless tobacco: Never Used  Substance Use Topics  . Alcohol use: Not Currently    Comment: 10/31/2017 "couple drinks/year"  . Drug use: Never     Allergies   Demerol [meperidine] and Oxycodone-acetaminophen   Review of Systems Review of Systems  Constitutional: Negative for chills and fever.  Respiratory: Positive for cough, chest tightness, shortness of breath and wheezing.   Cardiovascular: Negative for chest pain, palpitations and leg swelling.  All other systems reviewed and are negative.    Physical Exam Updated Vital Signs BP 134/68 (BP Location: Right Arm)   Pulse 99   Temp 97.6 F (36.4 C) (Oral)   Resp 20   Ht 5\' 5"  (1.651 m)   Wt 68 kg (150 lb)   SpO2 99%   BMI 24.96 kg/m   Physical Exam  Constitutional: She is oriented to person, place, and time. She appears well-developed and well-nourished. No distress.  Cooperative.  Mildly anxious and tremulous  HENT:  Head: Normocephalic and atraumatic.  Eyes: Pupils are equal, round, and reactive to light. Conjunctivae are normal. Right eye exhibits no discharge. Left eye exhibits no discharge. No scleral icterus.  Neck: Normal range of motion.  Cardiovascular: Normal rate and regular rhythm.  Pulmonary/Chest: Effort normal and breath sounds normal. Tachypnea noted. No respiratory distress.  Abdominal: Soft. Bowel sounds are normal. She exhibits no distension. There is no tenderness.  Musculoskeletal:  No peripheral edema  Neurological: She is alert and oriented to person, place, and time.  Skin: Skin is warm and dry.  Psychiatric: She has a normal mood and affect. Her behavior is normal.  Nursing note and vitals reviewed.    ED Treatments / Results  Labs (all labs ordered are listed, but only abnormal results are displayed) Labs Reviewed  BASIC METABOLIC PANEL - Abnormal; Notable for the following components:       Result Value   Glucose, Bld 120 (*)    All other components within normal limits  CBC  I-STAT TROPONIN, ED  I-STAT VENOUS BLOOD GAS, ED    EKG EKG Interpretation  Date/Time:  Sunday December 09 2017 05:49:45 EDT Ventricular Rate:  100 PR Interval:  140 QRS Duration: 66 QT Interval:  352 QTC Calculation: 347 R Axis:   87 Text Interpretation:  Sinus tachycardia borderline right axis devisation similar to previous from 10/30/17 Confirmed by Virgel Manifold [phone]) on 12/09/2017 9:10:46 AM   Radiology Dg Chest 2 View  Result Date: 12/09/2017 CLINICAL DATA:  Shortness of breath. EXAM: CHEST - 2 VIEW COMPARISON:  Radiograph 10/30/2017 FINDINGS: Chronic hyperinflation and bronchial thickening with biapical pleuroparenchymal scarring. The cardiomediastinal contours are normal. Pulmonary vasculature is normal. No consolidation, pleural effusion, or pneumothorax. No acute osseous abnormalities are seen.  IMPRESSION: 1. Chronic hyperinflation and bronchial thickening, imaging findings consistent with COPD. 2. No acute chest findings. Electronically Signed   By: Jeb Levering M.D.   On: 12/09/2017 06:32   Ct Angio Chest Pe W/cm &/or Wo Cm  Result Date: 12/09/2017 CLINICAL DATA:  67 year old female with acute onset shortness of breath. EXAM: CT ANGIOGRAPHY CHEST WITH CONTRAST TECHNIQUE: Multidetector CT imaging of the chest was performed using the standard protocol during bolus administration of intravenous contrast. Multiplanar CT image reconstructions and MIPs were obtained to evaluate the vascular anatomy. CONTRAST:  169mL ISOVUE-370 IOPAMIDOL (ISOVUE-370) INJECTION 76% COMPARISON:  Chest x-ray obtained earlier today; most recent prior chest CT 09/15/2017 FINDINGS: Cardiovascular: Adequate opacification of the pulmonary arteries to the proximal segmental level. No evidence of central filling defect to suggest acute pulmonary embolus. The main pulmonary artery is normal in size. There is mild fusiform  dilation of the tubular portion of the ascending thoracic aorta, however it remains normal in size. The aortic root is also normal in size. Trace atherosclerotic calcifications. No evidence of dissection. The heart is normal in size. No pericardial effusion. Mediastinum/Nodes: Unremarkable CT appearance of the thyroid gland. No suspicious mediastinal or hilar adenopathy. No soft tissue mediastinal mass. The thoracic esophagus is unremarkable. Lungs/Pleura: Biapical pleuroparenchymal scarring. Moderately severe centrilobular pulmonary emphysema. Moderate respiratory motion artifact. No suspicious nodule or mass. Upper Abdomen: No acute abnormality. Musculoskeletal: No chest wall abnormality. No acute or significant osseous findings. Review of the MIP images confirms the above findings. IMPRESSION: 1. Negative for acute pulmonary embolus, pneumonia or other acute cardiopulmonary process. Aortic Atherosclerosis (ICD10-I70.0) and Emphysema (ICD10-J43.9). Electronically Signed   By: Jacqulynn Cadet M.D.   On: 12/09/2017 10:13    Procedures Procedures (including critical care time)  CRITICAL CARE Performed by: Recardo Evangelist   Total critical care time: 30 minutes  Critical care time was exclusive of separately billable procedures and treating other patients.  Critical care was necessary to treat or prevent imminent or life-threatening deterioration.  Critical care was time spent personally by me on the following activities: development of treatment plan with patient and/or surrogate as well as nursing, discussions with consultants, evaluation of patient's response to treatment, examination of patient, obtaining history from patient or surrogate, ordering and performing treatments and interventions, ordering and review of laboratory studies, ordering and review of radiographic studies, pulse oximetry and re-evaluation of patient's condition.   Medications Ordered in ED Medications  magnesium  sulfate IVPB 2 g 50 mL (2 g Intravenous New Bag/Given 12/09/17 1052)  enoxaparin (LOVENOX) injection 40 mg (has no administration in time range)  fluticasone furoate-vilanterol (BREO ELLIPTA) 100-25 MCG/INH 1 puff (has no administration in time range)  methylPREDNISolone sodium succinate (SOLU-MEDROL) 125 mg/2 mL injection 60 mg (has no administration in time range)  ipratropium-albuterol (DUONEB) 0.5-2.5 (3) MG/3ML nebulizer solution 3 mL (has no administration in time range)  methylPREDNISolone sodium succinate (SOLU-MEDROL) 125 mg/2 mL injection 125 mg (125 mg Intravenous Given 12/09/17 0833)  iopamidol (ISOVUE-370) 76 % injection (100 mLs  Contrast Given 12/09/17 0938)  ipratropium-albuterol (DUONEB) 0.5-2.5 (3) MG/3ML nebulizer solution 3 mL (3 mLs Nebulization Given 12/09/17 1012)  LORazepam (ATIVAN) injection 0.5 mg (0.5 mg Intravenous Given 12/09/17 1012)  0.9 %  sodium chloride infusion (10 mL/hr Intravenous New Bag/Given 12/09/17 1049)     Initial Impression / Assessment and Plan / ED Course  I have reviewed the triage vital signs and the nursing notes.  Pertinent labs & imaging results that were  available during my care of the patient were reviewed by me and considered in my medical decision making (see chart for details).  67 year old female presents with shortness of breath.  She is hypertensive and vital signs are normal on 2 L of oxygen.  She does not normally wear oxygen at home.  She was taken off oxygen ambulated and her sats dropped to 85%.  She was placed back on 2 L of oxygen.  On exam her heart is regular rate and rhythm.  Her lungs are clear to auscultation.  She states that she was wheezing this morning however she is not now. She is still significantly short of breath and has chest tightness.  She does not have any known risk factors for PE however due to her unexplained hypoxia will obtain a CTA of the chest.  Her EKG is sinus tachycardia and appears previous to prior EKGs.  Her CBC  is normal per.  Her BMP is normal.  Her troponin is normal.  Her chest x-ray is consistent with COPD. Shared visit with Dr. Ralene Bathe. Will give another breathing tx, Ativan, and obtain VBG  10:23 AM CT is negative for PE. Will admit for ongoing management.   Final Clinical Impressions(s) / ED Diagnoses   Final diagnoses:  Acute respiratory failure with hypoxia [facility])  COPD exacerbation [facility])    ED Discharge Orders    None       Recardo Evangelist, PA-C 12/09/17 1114    Veryl Speak, MD 12/10/17 [phone]

## 2017-12-09 NOTE — Plan of Care (Signed)
Discussed plan of care with patient.  Emphasized pain management and the importance of using the call button when assistance is needed.

## 2017-12-10 DIAGNOSIS — F329 Major depressive disorder, single episode, unspecified: Secondary | ICD-10-CM

## 2017-12-10 DIAGNOSIS — Z720 Tobacco use: Secondary | ICD-10-CM

## 2017-12-10 DIAGNOSIS — E538 Deficiency of other specified B group vitamins: Secondary | ICD-10-CM

## 2017-12-10 DIAGNOSIS — J441 Chronic obstructive pulmonary disease with (acute) exacerbation: Principal | ICD-10-CM

## 2017-12-10 DIAGNOSIS — J9601 Acute respiratory failure with hypoxia: Secondary | ICD-10-CM

## 2017-12-10 LAB — BASIC METABOLIC PANEL
Anion gap: 9 (ref 5–15)
BUN: 21 mg/dL (ref 8–23)
CALCIUM: 8.5 mg/dL — AB (ref 8.9–10.3)
CO2: 22 mmol/L (ref 22–32)
CREATININE: 0.72 mg/dL (ref 0.44–1.00)
Chloride: 105 mmol/L (ref 98–111)
GFR calc Af Amer: >60 mL/min (ref >60)
GFR calc non Af Amer: >60 mL/min (ref >60)
Glucose, Bld: 131 mg/dL — ABNORMAL HIGH (ref 70–99)
Potassium: 4.4 mmol/L (ref 3.5–5.1)
Sodium: 136 mmol/L (ref 135–145)

## 2017-12-10 LAB — CBC
HCT: 35.9 % — ABNORMAL LOW (ref 36.0–46.0)
HEMOGLOBIN: 11.5 g/dL — AB (ref 12.0–15.0)
MCH: 29.6 pg (ref 26.0–34.0)
MCHC: 32 g/dL (ref 30.0–36.0)
MCV: 92.5 fL (ref 78.0–100.0)
PLATELETS: 298 10*3/uL (ref 150–400)
RBC: 3.88 MIL/uL (ref 3.87–5.11)
RDW: 13.2 % (ref 11.5–15.5)
WBC: 10.3 10*3/uL (ref 4.0–10.5)

## 2017-12-10 LAB — HEMOGLOBIN A1C
HEMOGLOBIN A1C: 5 % (ref 4.8–5.6)
Mean Plasma Glucose: 96.8 mg/dL

## 2017-12-10 LAB — HIV ANTIBODY (ROUTINE TESTING W REFLEX): HIV Screen 4th Generation wRfx: NONREACTIVE

## 2017-12-10 MED ORDER — SODIUM CHLORIDE 0.9 % IV SOLN
500.0000 mg | INTRAVENOUS | Status: DC
  Administered 2017-12-10 – 2017-12-11 (×2): 500 mg via INTRAVENOUS
  Filled 2017-12-10 (×2): qty 500

## 2017-12-10 MED ORDER — GUAIFENESIN ER 600 MG PO TB12
1200.0000 mg | ORAL_TABLET | Freq: Two times a day (BID) | ORAL | Status: DC
  Administered 2017-12-10 – 2017-12-11 (×3): 1200 mg via ORAL
  Filled 2017-12-10 (×3): qty 2

## 2017-12-10 MED ORDER — IPRATROPIUM-ALBUTEROL 0.5-2.5 (3) MG/3ML IN SOLN
3.0000 mL | Freq: Four times a day (QID) | RESPIRATORY_TRACT | Status: DC
  Administered 2017-12-10 (×3): 3 mL via RESPIRATORY_TRACT
  Filled 2017-12-10 (×2): qty 3

## 2017-12-10 MED ORDER — BUPROPION HCL ER (SR) 100 MG PO TB12
100.0000 mg | ORAL_TABLET | Freq: Every day | ORAL | Status: DC
  Administered 2017-12-11: 100 mg via ORAL
  Filled 2017-12-10 (×2): qty 1

## 2017-12-10 NOTE — Care Management Note (Signed)
Case Management Note  Patient Details  Name: Lori Butler MRN: [id number] Date of Birth: 05-Jan-1951  Subjective/Objective:   From home alone, presents with acute resp failure, copd, await pt/ot eval. Will need to have oxygen saturations checked prior to dc.                 Action/Plan: NCM will follow for transition of care needs.   Expected Discharge Date:                  Expected Discharge Plan:  Home/Self Care  In-House Referral:     Discharge planning Services  CM Consult  Post Acute Care Choice:    Choice offered to:     DME Arranged:    DME Agency:     HH Arranged:    HH Agency:     Status of Service:  In process, will continue to follow  If discussed at Long Length of Stay Meetings, dates discussed:    Additional Comments:  Zenon Mayo, RN 12/10/2017, 10:03 AM

## 2017-12-10 NOTE — Progress Notes (Signed)
PROGRESS NOTE    Lori Butler  FUX:[id number] DOB: 1951/03/30 DOA: 12/09/2017 PCP: [name], MD   Brief Narrative:  Lori Butler is a 67 y.o. female with medical history significant COPD not on home oxygen, tobacco use, osteoporosis, vitamin D deficiency, depression presents emergency Department chief complaint worsening shortness of breath. Initial evaluation reveals acute respiratory failure with hypoxia likely secondary to a COPD exacerbation. [facility] are asked to admit.  Assessment & Plan:   Principal Problem:   Acute respiratory failure with hypoxia (HCC) Active Problems:   Tobacco abuse   Vitamin B12 deficiency   COPD exacerbation ([facility])  Acute Respiratory Failure with Hypoxia likely secondary to a COPD exacerbation. -Admitted as an inpatient  -CT of the chest negative for acute pulmonary embolus, pneumonia or other acute cardiopulmonary process. Chest x-ray reveals chronic hyperinflation and bronchial thickening consistent with COPD. Oxygen saturation level 85% on room air with ambulation. Ration is not on home oxygen. She is provided with oxygen supplementation nebulizers and Solu-Medrol. At time of admission oxygen saturation level 97% on 2 L nasal cannula. -Continue oxygen supplementation and wean as tolerated -Continuous Pulse Oximetry and Maintain O2 Saturations >90% -Scheduled nebs -Solu-Medrol as bellow  -C/w Guaifenesin 1200 mg po BID  -Repeat CXR in AM   Acute COPD exacerbation/Acute Bronchitis  -Hospitalized in May and follows up with [facility] as an outpatient -She smoked about a pack a day for 53 years and quit in May of this year. -CTA Negative for acute pulmonary embolus, pneumonia or other acute cardiopulmonary process Aortic Atherosclerosis  -CXR Chronic hyperinflation and bronchial thickening, imaging findings consistent with COPD. No acute chest findings. -Started Steroids at 60 mg IV q6h -Given Azithromycin  -C/w Breo Ellipta and  Duo Neb q6h -Walk Screen done and she did not desaturate -Chart review indicates PFTs reveal moderate airway obstruction, ratio 59, FEV1 51%, FVC 65%, no significant zygomatic response, DLCO 57%. Most recent visit note indicates high risk for relapse. Recommending pulmonary rehabilitation. Lung function 53%. -Follow up with Pulmonary Dr. Elsworth Soho in the outpatient setting  -Set up with Pulmonary Rehab -Repeat CXR in AM  Tobacco Use -Patient stopped smoking in May 2019  Depression/Bipolar/Anxiety  -Continue Wellbutrin 100 daily -PRN Xanax   Normocytic Anemia -Patient's Hb/Hct went from 12.5/39.1 -> 11.5/35.9 -Check Anemia Panel in AM -Continue to Monitor for S/Sx of Bleeding -Repeat CBC in AM   DVT prophylaxis:  Code Status: FULL CODE Family Communication: No family present at bedside Disposition Plan:   Consultants:   None   Procedures: None   Antimicrobials: Anti-infectives (From admission, onward)   Start     Dose/Rate Route Frequency Ordered Stop   12/10/17 1000  azithromycin (ZITHROMAX) 500 mg in sodium chloride 0.9 % 250 mL IVPB     500 mg 250 mL/hr over 60 Minutes Intravenous Every 24 hours 12/10/17 0901       Subjective: States her shortness breath had improved slightly.  No chest pain or lightheadedness.  No other concerns or complaints at this time and feels better since coming to the hospital.   Objective: Vitals:   12/09/17 2307 12/09/17 2328 12/10/17 0723 12/10/17 0757  BP:  115/60  117/67  Pulse:  90  98  Resp:    16  Temp:  98 F (36.7 C)  98 F (36.7 C)  TempSrc:    Oral  SpO2: 97% 97% 94% 96%  Weight:      Height:        Intake/Output  Summary (Last 24 hours) at 12/10/2017 0800 Last data filed at 12/09/2017 1600 Gross per 24 hour  Intake 240 ml  Output 200 ml  Net 40 ml   Filed Weights   12/09/17 0534  Weight: 68 kg (150 lb)   Examination: Physical Exam:  Constitutional: Thin Caucasian female in NAD and appears calm and  comfortable Eyes: Lids and conjunctivae normal, sclerae anicteric  ENMT: External Ears, Nose appear normal. Grossly normal hearing. Mucous membranes are moist.  Neck: Appears normal, supple, no cervical masses, normal ROM, no appreciable thyromegaly, no JVD Respiratory: Diminished to auscultation bilaterally with some mild wheezing but no appreciable rales, rhonchi or crackles. Normal respiratory effort and patient is not tachypenic. No accessory muscle use.  Cardiovascular: RRR, no murmurs / rubs / gallops. S1 and S2 auscultated. No extremity edema. Abdomen: Soft, non-tender, non-distended. No masses palpated. No appreciable hepatosplenomegaly. Bowel sounds positive x4.  GU: Deferred. Musculoskeletal: No clubbing / cyanosis of digits/nails. No joint deformity upper and lower extremities.  Skin: No rashes, lesions, ulcers on a limited skin eval. No induration; Warm and dry.  Neurologic: CN 2-12 grossly intact with no focal deficits. Sensation intact in all 4 Extremities, DTR normal. Strength 5/5 in all 4. Romberg sign cerebellar reflexes not assessed.  Psychiatric: Normal judgment and insight. Alert and oriented x 3. Normal mood and appropriate affect.   Data Reviewed: I have personally reviewed following labs and imaging studies  CBC: Recent Labs  Lab 12/09/17 0544 12/10/17 0240  WBC 10.5 10.3  HGB 12.5 11.5*  HCT 39.1 35.9*  MCV 93.1 92.5  PLT 312 878   Basic Metabolic Panel: Recent Labs  Lab 12/09/17 0544 12/10/17 0240  NA 137 136  K 4.0 4.4  CL 100 105  CO2 23 22  GLUCOSE 120* 131*  BUN 13 21  CREATININE 0.69 0.72  CALCIUM 9.0 8.5*   GFR: Estimated Creatinine Clearance: 60.6 mL/min (by C-G formula based on SCr of 0.72 mg/dL). Liver Function Tests: No results for input(s): AST, ALT, ALKPHOS, BILITOT, PROT, ALBUMIN in the last 168 hours. No results for input(s): LIPASE, AMYLASE in the last 168 hours. No results for input(s): AMMONIA in the last 168 hours. Coagulation  Profile: No results for input(s): INR, PROTIME in the last 168 hours. Cardiac Enzymes: No results for input(s): CKTOTAL, CKMB, CKMBINDEX, TROPONINI in the last 168 hours. BNP (last 3 results) No results for input(s): PROBNP in the last 8760 hours. HbA1C: No results for input(s): HGBA1C in the last 72 hours. CBG: No results for input(s): GLUCAP in the last 168 hours. Lipid Profile: No results for input(s): CHOL, HDL, LDLCALC, TRIG, CHOLHDL, LDLDIRECT in the last 72 hours. Thyroid Function Tests: No results for input(s): TSH, T4TOTAL, FREET4, T3FREE, THYROIDAB in the last 72 hours. Anemia Panel: No results for input(s): VITAMINB12, FOLATE, FERRITIN, TIBC, IRON, RETICCTPCT in the last 72 hours. Sepsis Labs: No results for input(s): PROCALCITON, LATICACIDVEN in the last 168 hours.  No results found for this or any previous visit (from the past 240 hour(s)).   Radiology Studies: Dg Chest 2 View  Result Date: 12/09/2017 CLINICAL DATA:  Shortness of breath. EXAM: CHEST - 2 VIEW COMPARISON:  Radiograph 10/30/2017 FINDINGS: Chronic hyperinflation and bronchial thickening with biapical pleuroparenchymal scarring. The cardiomediastinal contours are normal. Pulmonary vasculature is normal. No consolidation, pleural effusion, or pneumothorax. No acute osseous abnormalities are seen. IMPRESSION: 1. Chronic hyperinflation and bronchial thickening, imaging findings consistent with COPD. 2. No acute chest findings. Electronically Signed  By: Jeb Levering M.D.   On: 12/09/2017 06:32   Ct Angio Chest Pe W/cm &/or Wo Cm  Result Date: 12/09/2017 CLINICAL DATA:  67 year old female with acute onset shortness of breath. EXAM: CT ANGIOGRAPHY CHEST WITH CONTRAST TECHNIQUE: Multidetector CT imaging of the chest was performed using the standard protocol during bolus administration of intravenous contrast. Multiplanar CT image reconstructions and MIPs were obtained to evaluate the vascular anatomy. CONTRAST:   15mL ISOVUE-370 IOPAMIDOL (ISOVUE-370) INJECTION 76% COMPARISON:  Chest x-ray obtained earlier today; most recent prior chest CT 09/15/2017 FINDINGS: Cardiovascular: Adequate opacification of the pulmonary arteries to the proximal segmental level. No evidence of central filling defect to suggest acute pulmonary embolus. The main pulmonary artery is normal in size. There is mild fusiform dilation of the tubular portion of the ascending thoracic aorta, however it remains normal in size. The aortic root is also normal in size. Trace atherosclerotic calcifications. No evidence of dissection. The heart is normal in size. No pericardial effusion. Mediastinum/Nodes: Unremarkable CT appearance of the thyroid gland. No suspicious mediastinal or hilar adenopathy. No soft tissue mediastinal mass. The thoracic esophagus is unremarkable. Lungs/Pleura: Biapical pleuroparenchymal scarring. Moderately severe centrilobular pulmonary emphysema. Moderate respiratory motion artifact. No suspicious nodule or mass. Upper Abdomen: No acute abnormality. Musculoskeletal: No chest wall abnormality. No acute or significant osseous findings. Review of the MIP images confirms the above findings. IMPRESSION: 1. Negative for acute pulmonary embolus, pneumonia or other acute cardiopulmonary process. Aortic Atherosclerosis (ICD10-I70.0) and Emphysema (ICD10-J43.9). Electronically Signed   By: Jacqulynn Cadet M.D.   On: 12/09/2017 10:13   Scheduled Meds: . enoxaparin (LOVENOX) injection  40 mg Subcutaneous Q24H  . fluticasone furoate-vilanterol  1 puff Inhalation Daily  . methylPREDNISolone (SOLU-MEDROL) injection  60 mg Intravenous Q6H   Continuous Infusions:   LOS: 1 day   Kerney Elbe, DO [facility] Pager [phone]  If 7PM-7AM, please contact night-coverage [email or web address] Password [email or web address] 12/10/2017, 8:00 AM

## 2017-12-10 NOTE — Care Management Note (Addendum)
Case Management Note  Patient Details  Name: Melena Hayes MRN: [id number] Date of Birth: 1950/11/06  Subjective/Objective:  From home alone, presents with acute resp failure, copd, await pt/ot eval. Will need to have oxygen saturations checked prior to dc.  per pt/ot eval no services needed. NCM spoke with patient about COPD disease management, she would like to have Belle with [facility].  Referral made to [facility] with Nanine Means .  Awaiting to hear back if they can provide services for patient. Received call from [facility] and states they can take patient for [facility].                         Action/Plan: NCM will follow for transition of care needs.   Expected Discharge Date:                  Expected Discharge Plan:  [place]  In-House Referral:     Discharge planning Services  CM Consult  Post Acute Care Choice:  Home Health Choice offered to:  Patient  DME Arranged:    DME Agency:     HH Arranged:  RN, Disease Management [place] Agency:  [place]  Status of Service:  Completed, signed off  If discussed at [place] of Stay Meetings, dates discussed:    Additional Comments:  Zenon Mayo, RN 12/10/2017, 2:59 PM

## 2017-12-10 NOTE — Evaluation (Addendum)
Physical Therapy Evaluation Patient Details Name: Lori Butler MRN: [id number] DOB: Sep 08, 1950 Today's Date: 12/10/2017   History of Present Illness  67 y.o. female with history of COPD and depression was brought to the ER after patient was found to be confused. Per MD,  SIRS/acute encephalopathy possibly medication related.  Clinical Impression  Pt admitted with above diagnosis. Pt currently with functional limitations due to the deficits listed below (see PT Problem List). Pt is independent at her baseline, still works half days ~4 days per week;  limited today due to rapid fatigue with amb/mobility; will benefit from PT in acute setting to maximize independence and activity tolerance in preparation for return home; Pt will benefit from skilled PT to increase their independence and safety with mobility to allow discharge to the venue listed below.       Follow Up Recommendations No PT follow up    Equipment Recommendations  None recommended by PT    Recommendations for Other Services       Precautions / Restrictions Precautions Precautions: Fall      Mobility  Bed Mobility Overal bed mobility: Modified Independent                Transfers Overall transfer level: Needs assistance   Transfers: Sit to/from Stand Sit to Stand: Supervision         General transfer comment: for safety  Ambulation/Gait Ambulation/Gait assistance: Min guard;Supervision Gait Distance (Feet): 120 Feet   Gait Pattern/deviations: Step-through pattern;Decreased stride length     General Gait Details: pt fatigues quickly, ~45 second standing rest needed after 60', SpO2=96-99% on RA, 3/4 DOE, HR max of 122  Stairs            Wheelchair Mobility    Modified Rankin (Stroke Patients Only)       Balance Overall balance assessment: Needs assistance Sitting-balance support: No upper extremity supported;Feet supported Sitting balance-Leahy Scale: Normal       Standing  balance-Leahy Scale: Fair Standing balance comment: at least Fair, not given moderate challenges d/t level of fatigue with amb                             Pertinent Vitals/Pain Pain Assessment: No/denies pain    Home Living Family/patient expects to be discharged to:: Private residence Living Arrangements: Alone   Type of Home: Apartment Home Access: Stairs to enter Entrance Stairs-Rails: Can reach both Entrance Stairs-Number of Steps: 2 Home Layout: One level Home Equipment: None      Prior Function Level of Independence: Independent         Comments: works as a Scientist, 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    Extremity/Trunk Assessment   Upper Extremity Assessment Upper Extremity Assessment: Overall WFL for tasks assessed    Lower Extremity Assessment Lower Extremity Assessment: Overall WFL for tasks assessed       Communication      Cognition Arousal/Alertness: Awake/alert Behavior During Therapy: WFL for tasks assessed/performed Overall Cognitive Status: Within Functional Limits for tasks assessed                                        General Comments      Exercises     Assessment/Plan    PT Assessment Patient needs continued PT services  PT  Problem List Decreased mobility;Decreased activity tolerance;Cardiopulmonary status limiting activity       PT Treatment Interventions Gait training;Therapeutic activities;Therapeutic exercise;Patient/family education;Stair training    PT Goals (Current goals can be found in the Care Plan section)  Acute Rehab PT Goals Patient Stated Goal: to be able to do more, not have to go home on O2 PT Goal Formulation: With patient Time For Goal Achievement: 12/24/17 Potential to Achieve Goals: Good    Frequency Min 3X/week   Barriers to discharge        Co-evaluation               AM-PAC PT "6 Clicks" Daily Activity  Outcome Measure  Difficulty turning over in bed (including adjusting bedclothes, sheets and blankets)?: None Difficulty moving from lying on back to sitting on the side of the bed? : None Difficulty sitting down on and standing up from a chair with arms (e.g., wheelchair, bedside commode, etc,.)?: A Little Help needed moving to and from a bed to chair (including a wheelchair)?: A Little Help needed walking in hospital room?: A Little Help needed climbing 3-5 steps with a railing? : A Little 6 Click Score: 20    End of Session   Activity Tolerance: Patient limited by fatigue Patient left: in chair;with call bell/phone within reach   PT Visit Diagnosis: Difficulty in walking, not elsewhere classified (R26.2)    Time: 7416-3845 PT Time Calculation (min) (ACUTE ONLY): 18 min   Charges:   PT Evaluation $PT Eval Low Complexity: 1 Low          Kenyon Ana, PT Pager: [phone] 12/10/2017   Kenyon Ana 12/10/2017, 2:21 PM

## 2017-12-10 NOTE — Progress Notes (Signed)
SATURATION QUALIFICATIONS: (This note is used to comply with regulatory documentation for home oxygen)  Patient Saturations on Room Air at Rest = 97%  Patient Saturations on Room Air while Ambulating = 90%  Patient Saturations on 2 Liters of oxygen while Ambulating = 96%

## 2017-12-10 NOTE — Evaluation (Signed)
Occupational Therapy Evaluation Patient Details Name: Lori Butler MRN: [id number] DOB: 1950-07-11 Today's Date: 12/10/2017    History of Present Illness 67 y.o. female with history of COPD and depression was brought to the ER after patient was found to be confused. Per MD,  SIRS/acute encephalopathy possibly medication related.   Clinical Impression   Pt is currently modified independent for selfcare tasks and functional transfers in to the bathroom.  Still with O2 sats decreasing to the upper 80s and low 90s without O2 and HR increasing to 126.  Recommend shower seat at home as well as integrating energy conservation strategies for selfcare and kitchen tasks.  No further acute or post acute OT needs at this time.      Follow Up Recommendations  No OT follow up    Equipment Recommendations  None recommended by OT(recommend shower seat, pt will obtain on her own)    Recommendations for Other Services       Precautions / Restrictions Precautions Precautions: Fall Restrictions Weight Bearing Restrictions: No      Mobility Bed Mobility Overal bed mobility: Modified Independent                Transfers Overall transfer level: Modified independent   Transfers: Sit to/from Stand Sit to Stand: Modified independent (Device/Increase time)         General transfer comment: for safety    Balance Overall balance assessment: Modified Independent Sitting-balance support: No upper extremity supported;Feet supported Sitting balance-Leahy Scale: Normal       Standing balance-Leahy Scale: Fair Standing balance comment: at least Fair, not given moderate challenges d/t level of fatigue with amb                           ADL either performed or assessed with clinical judgement   ADL Overall ADL's : Independent;Modified independent                                       General ADL Comments: Pt completed ADLs at modified independent level,  requiring periodic rest breaks secondary to fatigue.  Educated pt on need for tub/shower seat for home to help conserve energy.  Provided handout on energy conservation strategies as well.  No further OT needs at this time.  Oxygen sats at 96% on 2Ls nasal cannula decreasing to 90-89% on room air with mobility.       Vision Baseline Vision/History: No visual deficits Patient Visual Report: No change from baseline Vision Assessment?: No apparent visual deficits            Pertinent Vitals/Pain Pain Assessment: No/denies pain     Hand Dominance Right   Extremity/Trunk Assessment Upper Extremity Assessment Upper Extremity Assessment: Overall WFL for tasks assessed   Lower Extremity Assessment Lower Extremity Assessment: Defer to PT evaluation   Cervical / Trunk Assessment Cervical / Trunk Assessment: Normal   Communication     Cognition Arousal/Alertness: Awake/alert Behavior During Therapy: WFL for tasks assessed/performed Overall Cognitive Status: Within Functional Limits for tasks assessed                                                Home Living Family/patient expects to be discharged to:: Private residence Living  Arrangements: Alone   Type of Home: Apartment Home Access: Stairs to enter Entrance Stairs-Number of Steps: 2 Entrance Stairs-Rails: Can reach both Home Layout: One level     Bathroom Shower/Tub: Teacher, early years/pre: Standard(vanity beside of the toilet to assist with transfer if needed.)     Home Equipment: None          Prior Functioning/Environment Level of Independence: Independent        Comments: works as a Scientist, 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) Acute Rehab OT Goals Patient Stated Goal: to be able to do more  OT Frequency:                AM-PAC PT "6 Clicks" Daily Activity     Outcome Measure Help from another person  eating meals?: None Help from another person taking care of personal grooming?: None Help from another person toileting, which includes using toliet, bedpan, or urinal?: None Help from another person bathing (including washing, rinsing, drying)?: None Help from another person to put on and taking off regular upper body clothing?: None Help from another person to put on and taking off regular lower body clothing?: None 6 Click Score: 24   End of Session Equipment Utilized During Treatment: Oxygen Nurse Communication: Mobility status  Activity Tolerance: Patient tolerated treatment well Patient left: in bed;with call bell/phone within reach                   Time: 1416-1438 OT Time Calculation (min): 22 min Charges:  OT General Charges $OT Visit: 1 Visit OT Evaluation $OT Eval Low Complexity: 1 Low    Reace Breshears OTR/L 12/10/2017, 2:47 PM

## 2017-12-11 ENCOUNTER — Inpatient Hospital Stay (HOSPITAL_COMMUNITY): Payer: Medicare HMO

## 2017-12-11 DIAGNOSIS — R739 Hyperglycemia, unspecified: Secondary | ICD-10-CM

## 2017-12-11 DIAGNOSIS — D72829 Elevated white blood cell count, unspecified: Secondary | ICD-10-CM

## 2017-12-11 LAB — CBC WITH DIFFERENTIAL/PLATELET
ABS IMMATURE GRANULOCYTES: 0.1 10*3/uL (ref 0.0–0.1)
BASOS ABS: 0 10*3/uL (ref 0.0–0.1)
Basophils Relative: 0 %
EOS PCT: 0 %
Eosinophils Absolute: 0 10*3/uL (ref 0.0–0.7)
HCT: 36.2 % (ref 36.0–46.0)
Hemoglobin: 11.5 g/dL — ABNORMAL LOW (ref 12.0–15.0)
Immature Granulocytes: 0 %
LYMPHS ABS: 1 10*3/uL (ref 0.7–4.0)
LYMPHS PCT: 7 %
MCH: 29.4 pg (ref 26.0–34.0)
MCHC: 31.8 g/dL (ref 30.0–36.0)
MCV: 92.6 fL (ref 78.0–100.0)
MONO ABS: 0.3 10*3/uL (ref 0.1–1.0)
Monocytes Relative: 2 %
Neutro Abs: 12.9 10*3/uL — ABNORMAL HIGH (ref 1.7–7.7)
Neutrophils Relative %: 91 %
PLATELETS: 289 10*3/uL (ref 150–400)
RBC: 3.91 MIL/uL (ref 3.87–5.11)
RDW: 13.2 % (ref 11.5–15.5)
WBC: 14.2 10*3/uL — ABNORMAL HIGH (ref 4.0–10.5)

## 2017-12-11 LAB — RETICULOCYTES
RBC.: 3.91 MIL/uL (ref 3.87–5.11)
RETIC COUNT ABSOLUTE: 62.6 10*3/uL (ref 19.0–186.0)
Retic Ct Pct: 1.6 % (ref 0.4–3.1)

## 2017-12-11 LAB — COMPREHENSIVE METABOLIC PANEL
ALT: 32 U/L (ref 0–44)
AST: 22 U/L (ref 15–41)
Albumin: 3.5 g/dL (ref 3.5–5.0)
Alkaline Phosphatase: 131 U/L — ABNORMAL HIGH (ref 38–126)
Anion gap: 10 (ref 5–15)
BUN: 26 mg/dL — ABNORMAL HIGH (ref 8–23)
CHLORIDE: 105 mmol/L (ref 98–111)
CO2: 22 mmol/L (ref 22–32)
Calcium: 8.5 mg/dL — ABNORMAL LOW (ref 8.9–10.3)
Creatinine, Ser: 0.74 mg/dL (ref 0.44–1.00)
Glucose, Bld: 126 mg/dL — ABNORMAL HIGH (ref 70–99)
POTASSIUM: 4.6 mmol/L (ref 3.5–5.1)
Sodium: 137 mmol/L (ref 135–145)
Total Bilirubin: 0.6 mg/dL (ref 0.3–1.2)
Total Protein: 6.8 g/dL (ref 6.5–8.1)

## 2017-12-11 LAB — PHOSPHORUS: PHOSPHORUS: 4 mg/dL (ref 2.5–4.6)

## 2017-12-11 LAB — IRON AND TIBC
IRON: 66 ug/dL (ref 28–170)
Saturation Ratios: 23 % (ref 10.4–31.8)
TIBC: 286 ug/dL (ref 250–450)
UIBC: 220 ug/dL

## 2017-12-11 LAB — FOLATE: FOLATE: 23.2 ng/mL (ref >5.9)

## 2017-12-11 LAB — VITAMIN B12: VITAMIN B 12: 429 pg/mL (ref 180–914)

## 2017-12-11 LAB — FERRITIN: Ferritin: 85 ng/mL (ref 11–307)

## 2017-12-11 LAB — MAGNESIUM: MAGNESIUM: 2.4 mg/dL (ref 1.7–2.4)

## 2017-12-11 MED ORDER — PREDNISONE 10 MG (21) PO TBPK: ORAL_TABLET | ORAL | 0 refills | Status: DC

## 2017-12-11 MED ORDER — AZITHROMYCIN 500 MG PO TABS: 500.0000 mg | ORAL_TABLET | Freq: Every day | ORAL | 0 refills | Status: AC

## 2017-12-11 MED ORDER — IPRATROPIUM-ALBUTEROL 0.5-2.5 (3) MG/3ML IN SOLN
3.0000 mL | Freq: Three times a day (TID) | RESPIRATORY_TRACT | Status: DC
  Administered 2017-12-11 (×2): 3 mL via RESPIRATORY_TRACT
  Filled 2017-12-11 (×2): qty 3

## 2017-12-11 MED ORDER — IPRATROPIUM-ALBUTEROL 0.5-2.5 (3) MG/3ML IN SOLN: 3.0000 mL | RESPIRATORY_TRACT | Status: DC | PRN

## 2017-12-11 NOTE — Discharge Summary (Signed)
Physician Discharge Summary  Lori Butler KVQ:[id number] DOB: Apr 26, 1951 DOA: 12/09/2017  PCP: [name], MD  Admit date: 12/09/2017 Discharge date: 12/11/2017  Admitted From: Home Disposition:  Home with Home Health RN  Recommendations for Outpatient Follow-up:  1. Follow up with PCP in 1-2 weeks 2. Follow up with Pulmonary Dr. Elsworth Soho within 1-2 weeks 3. Please obtain CMP/CBC, Mag, Phos in one week 4. Please follow up on the following pending results:  Home Health: YES, RN  Equipment/Devices: None  Discharge Condition: Stable CODE STATUS: FULL CODE Diet recommendation: Heart Healthy Diet  Brief/Interim Summary: Lori Butler a 67 y.o.femalewith medical history significantCOPD not on home oxygen, tobacco use, osteoporosis, vitamin D deficiency, depression presents emergency Department chief complaint worsening shortness of breath. Initial evaluation reveals acute respiratory failure with hypoxia likely secondary to a COPD exacerbation.  She was placed on supplemental oxygen via nasal cannula, nebs, steroids, p.o. azithromycin with improvement.  She had a ambulatory home O2 screen prior to discharge and she did not desaturate.  She was deemed medically stable to be discharged home at this time will need to follow-up with primary care physician and Pulmonary outpatient setting.    Discharge Diagnoses:  Principal Problem:   Acute respiratory failure with hypoxia (HCC) Active Problems:   Tobacco abuse   Vitamin B12 deficiency   COPD exacerbation (HCC)  Acute Respiratory Failure with Hypoxia likely secondary to a COPD exacerbation. -Admitted as an inpatient  -CT of the chest negative for acute pulmonary embolus, pneumonia or other acute cardiopulmonary process. Chest x-ray reveals chronic hyperinflation and bronchial thickening consistent with COPD. Oxygen saturation level 85% on room air with ambulation. Ration is not on home oxygen. She is provided with oxygen supplementation  nebulizers and Solu-Medrol. At time of admission oxygen saturation level 97% on 2 L nasal cannula. -Continue oxygen supplementation and wean as tolerated -Continuous Pulse Oximetry and Maintain O2 Saturations >90% -Scheduled nebs -Solu-Medrol as bellow  -C/w Guaifenesin 1200 mg po BID  -Repeat CXR in AM   AcuteCOPD exacerbation/Acute Bronchitis  -Hospitalized in May and follows up with  Pulmonary as an outpatient -She smoked about a pack a day for 53 years and quit in May of this year. -CTA Negative for acute pulmonary embolus, pneumonia or other acute cardiopulmonary process Aortic Atherosclerosis  -CXR Chronic hyperinflation and bronchial thickening, imaging findings consistent with COPD. No acute chest findings; Repeat CXR this AM showed COPD. No pneumonia, CHF, nor other acute cardiopulmonary Abnormality. Thoracic aortic atherosclerosis. -Started Steroids at 60 mg IV q6h and will change to Sterapred Prednisone Taper at D/C -Given Azithromycin and wrote at D/C to complete 5 day course  -C/w Breo Ellipta and Duo Neb q6h while hospitalized;  Resume Albuterol Inhaler, Spiriva, Advair at D/C -Walk Screen done and she did not desaturate -Chart review indicates PFTs reveal moderate airway obstruction, ratio 59, FEV1 51%, FVC 65%, no significant zygomatic response, DLCO 57%. Most recent visit note indicates high risk for relapse. Recommending pulmonary rehabilitation. Lung function 53%. -Follow up with Pulmonary Dr. Elsworth Soho in the outpatient setting  -Set up with Pulmonary Rehab -Repeat CXR as an outpateint -Will D/C Home with Home Health RN  Tobacco Use -Patient stopped smoking in May 2019  Depression/Bipolar/Anxiety  -Continue Wellbutrin 100 daily -PRN Xanax   Normocytic Anemia -Patient's Hb/Hct went from 12.5/39.1 -> 11.5/35.9 -> 11.5/36.2 -Checked Anemia Panel this AM showed iron level 66, TIBC of 220, TIBC of 286, saturation ratio is 23%, ferritin level of 85, folate  level 23.2,  vitamin B12 level 429 -Continue to Monitor for S/Sx of Bleeding -Repeat CBC in AM   Leukocytosis -Patient's WBC went from 10.3 and is now 14.2 -Setting of IV steroid demargination -Continue to monitor for signs and symptoms of infection -Repeat CBC as an outpatient  Hyperglycemia -In the setting of IV steroid demargination -Check hemoglobin A1c was 5.0 -Continue to monitor CBGs and will add insulin sliding scale if consistently elevated however can follow-up as an outpatient as this is stable  Discharge Instructions  Discharge Instructions    Call MD for:  difficulty breathing, headache or visual disturbances   Complete by:  As directed    Call MD for:  extreme fatigue   Complete by:  As directed    Call MD for:  hives   Complete by:  As directed    Call MD for:  persistant dizziness or light-headedness   Complete by:  As directed    Call MD for:  persistant nausea and vomiting   Complete by:  As directed    Call MD for:  redness, tenderness, or signs of infection (pain, swelling, redness, odor or green/yellow discharge around incision site)   Complete by:  As directed    Call MD for:  severe uncontrolled pain   Complete by:  As directed    Call MD for:  temperature >100.4   Complete by:  As directed    Diet - low sodium heart healthy   Complete by:  As directed    Discharge instructions   Complete by:  As directed    Follow up with PCP and Pulmonary in the outpatient setting. Take all medications as prescribed. If symptoms change or worsen please return to the ED For evaluation.   Increase activity slowly   Complete by:  As directed      Allergies as of 12/11/2017      Reactions   Demerol [meperidine] Other (See Comments)   "horrible reaction"   Oxycodone-acetaminophen Other (See Comments)   Upset stomach       Medication List    TAKE these medications   acetaminophen 500 MG tablet Commonly known as:  TYLENOL Take 1,000 mg by mouth every 6 (six)  hours as needed for mild pain or headache.   albuterol (2.5 MG/3ML) 0.083% nebulizer solution Commonly known as:  PROVENTIL Take 3 mLs (2.5 mg total) by nebulization every 4 (four) hours as needed for wheezing or shortness of breath.   albuterol 108 (90 Base) MCG/ACT inhaler Commonly known as:  PROVENTIL HFA;VENTOLIN HFA Inhale 2 puffs into the lungs every 6 (six) hours as needed for wheezing or shortness of breath.   alendronate 70 MG tablet Commonly known as:  FOSAMAX Take 70 mg by mouth once a week. On Wednesdays   azithromycin 500 MG tablet Commonly known as:  ZITHROMAX Take 1 tablet (500 mg total) by mouth daily for 3 days. Take 1 tablet daily for 3 days.   buPROPion 100 MG 12 hr tablet Commonly known as:  WELLBUTRIN SR Take 1 tablet (100 mg total) by mouth daily.   Fluticasone-Salmeterol 100-50 MCG/DOSE Aepb Commonly known as:  ADVAIR DISKUS Inhale 1 puff into the lungs 2 (two) times daily.   folic acid 1 MG tablet Commonly known as:  FOLVITE Take 1 tablet (1 mg total) by mouth daily.   guaiFENesin 600 MG 12 hr tablet Commonly known as:  MUCINEX Take 2 tablets (1,200 mg total) by mouth 2 (two) times daily.   naproxen sodium 220  MG tablet Commonly known as:  ALEVE Take 220 mg by mouth as needed (pain).   predniSONE 10 MG (21) Tbpk tablet Commonly known as:  STERAPRED UNI-PAK 21 TAB Take 6 Tablets Day 1, 5 Tablets Day 2, 4 Tablets Day 3, 3 Tablets Day 4, 2 Tablets Day 5, 1 Tablet Day 6, and Stop on Day 7   sodium chloride 0.65 % Soln nasal spray Commonly known as:  OCEAN Place 1 spray into both nostrils as needed for congestion.   tiotropium 18 MCG inhalation capsule Commonly known as:  SPIRIVA HANDIHALER Place 1 capsule (18 mcg total) into inhaler and inhale daily.   vitamin B-12 1000 MCG tablet Commonly known as:  CYANOCOBALAMIN Take 1 tablet (1,000 mcg total) by mouth daily. Please start after patient has received 7 days of IM vitamin B12.   Vitamin D  (Ergocalciferol) 50000 units Caps capsule Commonly known as:  DRISDOL Take 50,000 Units by mouth every 7 (seven) days.      Follow-up Information    [place], [address] Follow up.   Specialty:  Home Health Services Why:  [place] information: [place] [id number] [phone]          Allergies  Allergen Reactions  . Demerol [Meperidine] Other (See Comments)    "horrible reaction"  . Oxycodone-Acetaminophen Other (See Comments)    Upset stomach    Consultations:  None  Procedures/Studies: Dg Chest 2 View  Result Date: 12/09/2017 CLINICAL DATA:  Shortness of breath. EXAM: CHEST - 2 VIEW COMPARISON:  Radiograph 10/30/2017 FINDINGS: Chronic hyperinflation and bronchial thickening with biapical pleuroparenchymal scarring. The cardiomediastinal contours are normal. Pulmonary vasculature is normal. No consolidation, pleural effusion, or pneumothorax. No acute osseous abnormalities are seen. IMPRESSION: 1. Chronic hyperinflation and bronchial thickening, imaging findings consistent with COPD. 2. No acute chest findings. Electronically Signed   By: Jeb Levering M.D.   On: 12/09/2017 06:32   Ct Angio Chest Pe W/cm &/or Wo Cm  Result Date: 12/09/2017 CLINICAL DATA:  67 year old female with acute onset shortness of breath. EXAM: CT ANGIOGRAPHY CHEST WITH CONTRAST TECHNIQUE: Multidetector CT imaging of the chest was performed using the standard protocol during bolus administration of intravenous contrast. Multiplanar CT image reconstructions and MIPs were obtained to evaluate the vascular anatomy. CONTRAST:  174mL ISOVUE-370 IOPAMIDOL (ISOVUE-370) INJECTION 76% COMPARISON:  Chest x-ray obtained earlier today; most recent prior chest CT 09/15/2017 FINDINGS: Cardiovascular: Adequate opacification of the pulmonary arteries to the proximal segmental level. No evidence of central filling defect to suggest acute pulmonary embolus. The main pulmonary  artery is normal in size. There is mild fusiform dilation of the tubular portion of the ascending thoracic aorta, however it remains normal in size. The aortic root is also normal in size. Trace atherosclerotic calcifications. No evidence of dissection. The heart is normal in size. No pericardial effusion. Mediastinum/Nodes: Unremarkable CT appearance of the thyroid gland. No suspicious mediastinal or hilar adenopathy. No soft tissue mediastinal mass. The thoracic esophagus is unremarkable. Lungs/Pleura: Biapical pleuroparenchymal scarring. Moderately severe centrilobular pulmonary emphysema. Moderate respiratory motion artifact. No suspicious nodule or mass. Upper Abdomen: No acute abnormality. Musculoskeletal: No chest wall abnormality. No acute or significant osseous findings. Review of the MIP images confirms the above findings. IMPRESSION: 1. Negative for acute pulmonary embolus, pneumonia or other acute cardiopulmonary process. Aortic Atherosclerosis (ICD10-I70.0) and Emphysema (ICD10-J43.9). Electronically Signed   By: Jacqulynn Cadet M.D.   On: 12/09/2017 10:13   Dg Chest Port 1  View  Result Date: 12/11/2017 CLINICAL DATA:  Shortness of breath. Former smoker. History of COPD EXAM: PORTABLE CHEST 1 VIEW COMPARISON:  Chest x-ray and chest CT scan of December 09, 2017 FINDINGS: The lungs remain hyperinflated. The interstitial markings are mildly prominent. There is no alveolar infiltrate, pleural effusion, or pneumothorax. The heart and pulmonary vascularity are normal. There is calcification in the wall of the aortic arch. The bony thorax exhibits no acute abnormality. IMPRESSION: COPD. No pneumonia, CHF, nor other acute cardiopulmonary abnormality. Thoracic aortic atherosclerosis. Electronically Signed   By: David  Martinique M.D.   On: 12/11/2017 07:39    Subjective: Seen and examined at bedside was doing better.  States shortness of breath had significantly improved and she was not wheezing.  No  lightheadedness or dizziness.  Denies any chest pain, shortness breath, nausea, vomiting.  Ready to go home.  Discharge Exam: Vitals:   12/11/17 0755 12/11/17 1313  BP: 115/66   Pulse: 92 (!) 102  Resp: 20 20  Temp: 98.3 F (36.8 C)   SpO2: 97% 96%   Vitals:   12/10/17 2300 12/11/17 0736 12/11/17 0755 12/11/17 1313  BP: 114/65  115/66   Pulse: (!) 121 (!) 118 92 (!) 102  Resp: 16 16 20 20   Temp: 98.6 F (37 C)  98.3 F (36.8 C)   TempSrc: Oral  Oral   SpO2: 96% 95% 97% 96%  Weight:      Height:       General: Pt is alert, awake, not in acute distress Cardiovascular: RRR, S1/S2 +, no rubs, no gallops Respiratory: Diminished bilaterally with slight expiratory wheezing, no rhonchi;  Unlabored breathing and not using any accessory muscles to breathe Abdominal: Soft, NT, ND, bowel sounds + Extremities: no LE edema, no cyanosis  The results of significant diagnostics from this hospitalization (including imaging, microbiology, ancillary and laboratory) are listed below for reference.    Microbiology: No results found for this or any previous visit (from the past 240 hour(s)).   Labs: BNP (last 3 results) No results for input(s): BNP in the last 8760 hours. Basic Metabolic Panel: Recent Labs  Lab 12/09/17 0544 12/10/17 0240 12/11/17 0320  NA 137 136 137  K 4.0 4.4 4.6  CL 100 105 105  CO2 23 22 22   GLUCOSE 120* 131* 126*  BUN 13 21 26*  CREATININE 0.69 0.72 0.74  CALCIUM 9.0 8.5* 8.5*  MG  --   --  2.4  PHOS  --   --  4.0   Liver Function Tests: Recent Labs  Lab 12/11/17 0320  AST 22  ALT 32  ALKPHOS 131*  BILITOT 0.6  PROT 6.8  ALBUMIN 3.5   No results for input(s): LIPASE, AMYLASE in the last 168 hours. No results for input(s): AMMONIA in the last 168 hours. CBC: Recent Labs  Lab 12/09/17 0544 12/10/17 0240 12/11/17 0320  WBC 10.5 10.3 14.2*  NEUTROABS  --   --  12.9*  HGB 12.5 11.5* 11.5*  HCT 39.1 35.9* 36.2  MCV 93.1 92.5 92.6  PLT 312 298  289   Cardiac Enzymes: No results for input(s): CKTOTAL, CKMB, CKMBINDEX, TROPONINI in the last 168 hours. BNP: Invalid input(s): POCBNP CBG: No results for input(s): GLUCAP in the last 168 hours. D-Dimer No results for input(s): DDIMER in the last 72 hours. Hgb A1c Recent Labs    12/10/17 0240  HGBA1C 5.0   Lipid Profile No results for input(s): CHOL, HDL, LDLCALC, TRIG, CHOLHDL, LDLDIRECT in the  last 72 hours. Thyroid function studies No results for input(s): TSH, T4TOTAL, T3FREE, THYROIDAB in the last 72 hours.  Invalid input(s): FREET3 Anemia work up Recent Labs    12/11/17 0320  VITAMINB12 429  FOLATE 23.2  FERRITIN 85  TIBC 286  IRON 66  RETICCTPCT 1.6   Urinalysis    Component Value Date/Time   COLORURINE YELLOW 10/30/2017 [place] 10/30/2017 1015   LABSPEC 1.026 10/30/2017 1015   PHURINE 5.0 10/30/2017 1015   GLUCOSEU NEGATIVE 10/30/2017 1015   HGBUR NEGATIVE 10/30/2017 1015   BILIRUBINUR NEGATIVE 10/30/2017 1015   KETONESUR 20 (A) 10/30/2017 1015   PROTEINUR NEGATIVE 10/30/2017 1015   UROBILINOGEN 1.0 07/12/2012 1842   NITRITE NEGATIVE 10/30/2017 1015   LEUKOCYTESUR SMALL (A) 10/30/2017 1015   Sepsis Labs Invalid input(s): PROCALCITONIN,  WBC,  LACTICIDVEN Microbiology No results found for this or any previous visit (from the past 240 hour(s)).  Time coordinating discharge: 35 minutes  SIGNED:  Kerney Elbe, DO [facility] 12/11/2017, 3:49 PM Pager [phone]  If 7PM-7AM, please contact night-coverage [email or web address] Password [email or web address]

## 2017-12-11 NOTE — Plan of Care (Signed)
Discussed plan of care with patient.  Emphasized pain management and using the call button when assistance is needed.

## 2017-12-11 NOTE — Progress Notes (Signed)
Pt given DC instructions and had no further questions. Pt to follow up with HHC. Pts belongings returned and vitals stable at DC. Pt escorted by wheelchair.  Riley Kill RN

## 2017-12-14 DIAGNOSIS — M810 Age-related osteoporosis without current pathological fracture: Secondary | ICD-10-CM | POA: Diagnosis not present

## 2017-12-14 DIAGNOSIS — J441 Chronic obstructive pulmonary disease with (acute) exacerbation: Secondary | ICD-10-CM | POA: Diagnosis not present

## 2017-12-14 DIAGNOSIS — F319 Bipolar disorder, unspecified: Secondary | ICD-10-CM | POA: Diagnosis not present

## 2017-12-14 DIAGNOSIS — J9691 Respiratory failure, unspecified with hypoxia: Secondary | ICD-10-CM | POA: Diagnosis not present

## 2017-12-14 DIAGNOSIS — J209 Acute bronchitis, unspecified: Secondary | ICD-10-CM | POA: Diagnosis not present

## 2017-12-14 DIAGNOSIS — M1990 Unspecified osteoarthritis, unspecified site: Secondary | ICD-10-CM | POA: Diagnosis not present

## 2017-12-14 DIAGNOSIS — J440 Chronic obstructive pulmonary disease with acute lower respiratory infection: Secondary | ICD-10-CM | POA: Diagnosis not present

## 2017-12-14 DIAGNOSIS — F419 Anxiety disorder, unspecified: Secondary | ICD-10-CM | POA: Diagnosis not present

## 2017-12-14 DIAGNOSIS — D649 Anemia, unspecified: Secondary | ICD-10-CM | POA: Diagnosis not present

## 2017-12-17 DIAGNOSIS — J449 Chronic obstructive pulmonary disease, unspecified: Secondary | ICD-10-CM | POA: Diagnosis not present

## 2017-12-18 DIAGNOSIS — M810 Age-related osteoporosis without current pathological fracture: Secondary | ICD-10-CM | POA: Diagnosis not present

## 2017-12-18 DIAGNOSIS — F319 Bipolar disorder, unspecified: Secondary | ICD-10-CM | POA: Diagnosis not present

## 2017-12-18 DIAGNOSIS — J440 Chronic obstructive pulmonary disease with acute lower respiratory infection: Secondary | ICD-10-CM | POA: Diagnosis not present

## 2017-12-18 DIAGNOSIS — J9691 Respiratory failure, unspecified with hypoxia: Secondary | ICD-10-CM | POA: Diagnosis not present

## 2017-12-18 DIAGNOSIS — J209 Acute bronchitis, unspecified: Secondary | ICD-10-CM | POA: Diagnosis not present

## 2017-12-18 DIAGNOSIS — F419 Anxiety disorder, unspecified: Secondary | ICD-10-CM | POA: Diagnosis not present

## 2017-12-18 DIAGNOSIS — M1990 Unspecified osteoarthritis, unspecified site: Secondary | ICD-10-CM | POA: Diagnosis not present

## 2017-12-18 DIAGNOSIS — J441 Chronic obstructive pulmonary disease with (acute) exacerbation: Secondary | ICD-10-CM | POA: Diagnosis not present

## 2017-12-18 DIAGNOSIS — D649 Anemia, unspecified: Secondary | ICD-10-CM | POA: Diagnosis not present

## 2017-12-20 DIAGNOSIS — J9691 Respiratory failure, unspecified with hypoxia: Secondary | ICD-10-CM | POA: Diagnosis not present

## 2017-12-20 DIAGNOSIS — F319 Bipolar disorder, unspecified: Secondary | ICD-10-CM | POA: Diagnosis not present

## 2017-12-20 DIAGNOSIS — M810 Age-related osteoporosis without current pathological fracture: Secondary | ICD-10-CM | POA: Diagnosis not present

## 2017-12-20 DIAGNOSIS — J209 Acute bronchitis, unspecified: Secondary | ICD-10-CM | POA: Diagnosis not present

## 2017-12-20 DIAGNOSIS — F419 Anxiety disorder, unspecified: Secondary | ICD-10-CM | POA: Diagnosis not present

## 2017-12-20 DIAGNOSIS — D649 Anemia, unspecified: Secondary | ICD-10-CM | POA: Diagnosis not present

## 2017-12-20 DIAGNOSIS — J441 Chronic obstructive pulmonary disease with (acute) exacerbation: Secondary | ICD-10-CM | POA: Diagnosis not present

## 2017-12-20 DIAGNOSIS — J440 Chronic obstructive pulmonary disease with acute lower respiratory infection: Secondary | ICD-10-CM | POA: Diagnosis not present

## 2017-12-20 DIAGNOSIS — M1990 Unspecified osteoarthritis, unspecified site: Secondary | ICD-10-CM | POA: Diagnosis not present

## 2017-12-24 ENCOUNTER — Other Ambulatory Visit: Payer: Self-pay

## 2017-12-24 ENCOUNTER — Encounter (HOSPITAL_COMMUNITY): Payer: Self-pay | Admitting: Emergency Medicine

## 2017-12-24 ENCOUNTER — Emergency Department (HOSPITAL_COMMUNITY): Payer: Medicare HMO

## 2017-12-24 ENCOUNTER — Inpatient Hospital Stay (HOSPITAL_COMMUNITY)
Admission: EM | Admit: 2017-12-24 | Discharge: 2017-12-26 | DRG: 190 | Disposition: A | Discharge status: Home Health | Payer: Medicare HMO | Attending: Pulmonary Disease | Admitting: Pulmonary Disease

## 2017-12-24 DIAGNOSIS — M810 Age-related osteoporosis without current pathological fracture: Secondary | ICD-10-CM | POA: Diagnosis present

## 2017-12-24 DIAGNOSIS — J441 Chronic obstructive pulmonary disease with (acute) exacerbation: Principal | ICD-10-CM

## 2017-12-24 DIAGNOSIS — Z7951 Long term (current) use of inhaled steroids: Secondary | ICD-10-CM

## 2017-12-24 DIAGNOSIS — F419 Anxiety disorder, unspecified: Secondary | ICD-10-CM | POA: Diagnosis present

## 2017-12-24 DIAGNOSIS — R0602 Shortness of breath: Secondary | ICD-10-CM | POA: Diagnosis not present

## 2017-12-24 DIAGNOSIS — M1990 Unspecified osteoarthritis, unspecified site: Secondary | ICD-10-CM | POA: Diagnosis present

## 2017-12-24 DIAGNOSIS — R5381 Other malaise: Secondary | ICD-10-CM | POA: Diagnosis not present

## 2017-12-24 DIAGNOSIS — J9620 Acute and chronic respiratory failure, unspecified whether with hypoxia or hypercapnia: Secondary | ICD-10-CM | POA: Diagnosis present

## 2017-12-24 DIAGNOSIS — J9602 Acute respiratory failure with hypercapnia: Secondary | ICD-10-CM

## 2017-12-24 DIAGNOSIS — R0689 Other abnormalities of breathing: Secondary | ICD-10-CM | POA: Diagnosis not present

## 2017-12-24 DIAGNOSIS — Z7983 Long term (current) use of bisphosphonates: Secondary | ICD-10-CM

## 2017-12-24 DIAGNOSIS — R069 Unspecified abnormalities of breathing: Secondary | ICD-10-CM | POA: Diagnosis not present

## 2017-12-24 DIAGNOSIS — I700 Atherosclerosis of aorta: Secondary | ICD-10-CM | POA: Diagnosis present

## 2017-12-24 DIAGNOSIS — Z79899 Other long term (current) drug therapy: Secondary | ICD-10-CM | POA: Diagnosis not present

## 2017-12-24 DIAGNOSIS — K219 Gastro-esophageal reflux disease without esophagitis: Secondary | ICD-10-CM | POA: Diagnosis present

## 2017-12-24 DIAGNOSIS — Z885 Allergy status to narcotic agent status: Secondary | ICD-10-CM

## 2017-12-24 DIAGNOSIS — E872 Acidosis: Secondary | ICD-10-CM | POA: Diagnosis not present

## 2017-12-24 DIAGNOSIS — J9601 Acute respiratory failure with hypoxia: Secondary | ICD-10-CM | POA: Diagnosis not present

## 2017-12-24 DIAGNOSIS — J9622 Acute and chronic respiratory failure with hypercapnia: Secondary | ICD-10-CM | POA: Diagnosis not present

## 2017-12-24 DIAGNOSIS — D649 Anemia, unspecified: Secondary | ICD-10-CM | POA: Diagnosis not present

## 2017-12-24 DIAGNOSIS — R0902 Hypoxemia: Secondary | ICD-10-CM | POA: Diagnosis not present

## 2017-12-24 DIAGNOSIS — Z87891 Personal history of nicotine dependence: Secondary | ICD-10-CM

## 2017-12-24 DIAGNOSIS — R000 Tachycardia, unspecified: Secondary | ICD-10-CM | POA: Diagnosis not present

## 2017-12-24 DIAGNOSIS — R05 Cough: Secondary | ICD-10-CM | POA: Diagnosis not present

## 2017-12-24 DIAGNOSIS — J9690 Respiratory failure, unspecified, unspecified whether with hypoxia or hypercapnia: Secondary | ICD-10-CM | POA: Diagnosis present

## 2017-12-24 LAB — COMPREHENSIVE METABOLIC PANEL
ALK PHOS: 128 U/L — AB (ref 38–126)
ALT: 27 U/L (ref 0–44)
AST: 23 U/L (ref 15–41)
Albumin: 3.8 g/dL (ref 3.5–5.0)
Anion gap: 8 (ref 5–15)
BUN: 11 mg/dL (ref 8–23)
CALCIUM: 8.5 mg/dL — AB (ref 8.9–10.3)
CO2: 25 mmol/L (ref 22–32)
Chloride: 108 mmol/L (ref 98–111)
Creatinine, Ser: 0.63 mg/dL (ref 0.44–1.00)
Glucose, Bld: 153 mg/dL — ABNORMAL HIGH (ref 70–99)
Potassium: 4.5 mmol/L (ref 3.5–5.1)
Sodium: 141 mmol/L (ref 135–145)
Total Bilirubin: 0.7 mg/dL (ref 0.3–1.2)
Total Protein: 6.8 g/dL (ref 6.5–8.1)

## 2017-12-24 LAB — CBC WITH DIFFERENTIAL/PLATELET
ABS IMMATURE GRANULOCYTES: 0.1 10*3/uL (ref 0.0–0.1)
Basophils Absolute: 0.1 10*3/uL (ref 0.0–0.1)
Basophils Relative: 1 %
EOS PCT: 5 %
Eosinophils Absolute: 0.7 10*3/uL (ref 0.0–0.7)
HCT: 40.3 % (ref 36.0–46.0)
Hemoglobin: 12.5 g/dL (ref 12.0–15.0)
Immature Granulocytes: 1 %
Lymphocytes Relative: 18 %
Lymphs Abs: 2.7 10*3/uL (ref 0.7–4.0)
MCH: 29.6 pg (ref 26.0–34.0)
MCHC: 31 g/dL (ref 30.0–36.0)
MCV: 95.5 fL (ref 78.0–100.0)
MONO ABS: 0.9 10*3/uL (ref 0.1–1.0)
MONOS PCT: 6 %
NEUTROS ABS: 10.6 10*3/uL — AB (ref 1.7–7.7)
Neutrophils Relative %: 69 %
PLATELETS: 321 10*3/uL (ref 150–400)
RBC: 4.22 MIL/uL (ref 3.87–5.11)
RDW: 13.4 % (ref 11.5–15.5)
WBC: 15 10*3/uL — ABNORMAL HIGH (ref 4.0–10.5)

## 2017-12-24 LAB — I-STAT VENOUS BLOOD GAS, ED
Acid-base deficit: 3 mmol/L — ABNORMAL HIGH (ref 0.0–2.0)
Acid-base deficit: 2 mmol/L (ref 0.0–2.0)
Bicarbonate: 25.7 mmol/L (ref 20.0–28.0)
Bicarbonate: 24 mmol/L (ref 20.0–28.0)
O2 Saturation: 95 %
O2 Saturation: 76 %
PCO2 VEN: 44.9 mmHg (ref 44.0–60.0)
PH VEN: 7.336 (ref 7.250–7.430)
TCO2: 28 mmol/L (ref 22–32)
TCO2: 25 mmol/L (ref 22–32)
pCO2, Ven: 64.2 mmHg — ABNORMAL HIGH (ref 44.0–60.0)
pH, Ven: 7.211 — ABNORMAL LOW (ref 7.250–7.430)
pO2, Ven: 97 mmHg — ABNORMAL HIGH (ref 32.0–45.0)
pO2, Ven: 43 mmHg (ref 32.0–45.0)

## 2017-12-24 LAB — I-STAT TROPONIN, ED: TROPONIN I, POC: 0 ng/mL (ref 0.00–0.08)

## 2017-12-24 MED ORDER — ALPRAZOLAM 0.25 MG PO TABS: 0.2500 mg | ORAL_TABLET | Freq: Three times a day (TID) | ORAL | Status: DC | PRN

## 2017-12-24 MED ORDER — ALBUTEROL SULFATE (2.5 MG/3ML) 0.083% IN NEBU: 2.5000 mg | INHALATION_SOLUTION | RESPIRATORY_TRACT | Status: DC | PRN

## 2017-12-24 MED ORDER — SODIUM CHLORIDE 0.9 % IV SOLN
INTRAVENOUS | Status: DC
  Administered 2017-12-24 – 2017-12-26 (×3): via INTRAVENOUS

## 2017-12-24 MED ORDER — METHYLPREDNISOLONE SODIUM SUCC 125 MG IJ SOLR
80.0000 mg | Freq: Three times a day (TID) | INTRAMUSCULAR | Status: DC
  Administered 2017-12-24 – 2017-12-25 (×3): 80 mg via INTRAVENOUS
  Filled 2017-12-24 (×3): qty 2

## 2017-12-24 MED ORDER — IPRATROPIUM-ALBUTEROL 0.5-2.5 (3) MG/3ML IN SOLN
3.0000 mL | Freq: Four times a day (QID) | RESPIRATORY_TRACT | Status: DC
  Administered 2017-12-24 – 2017-12-26 (×7): 3 mL via RESPIRATORY_TRACT
  Filled 2017-12-24 (×7): qty 3

## 2017-12-24 MED ORDER — BUDESONIDE 0.5 MG/2ML IN SUSP
0.5000 mg | Freq: Two times a day (BID) | RESPIRATORY_TRACT | Status: DC
  Administered 2017-12-24 – 2017-12-26 (×4): 0.5 mg via RESPIRATORY_TRACT
  Filled 2017-12-24 (×6): qty 2

## 2017-12-24 MED ORDER — ALBUTEROL SULFATE (2.5 MG/3ML) 0.083% IN NEBU
INHALATION_SOLUTION | RESPIRATORY_TRACT | Status: AC
  Administered 2017-12-24: 10 mg
  Filled 2017-12-24: qty 12

## 2017-12-24 MED ORDER — BUPROPION HCL ER (SR) 100 MG PO TB12
100.0000 mg | ORAL_TABLET | Freq: Every day | ORAL | Status: DC
  Administered 2017-12-25 – 2017-12-26 (×2): 100 mg via ORAL
  Filled 2017-12-24 (×2): qty 1

## 2017-12-24 MED ORDER — ORAL CARE MOUTH RINSE
15.0000 mL | Freq: Two times a day (BID) | OROMUCOSAL | Status: DC
  Administered 2017-12-24 – 2017-12-26 (×4): 15 mL via OROMUCOSAL

## 2017-12-24 MED ORDER — HEPARIN SODIUM (PORCINE) 5000 UNIT/ML IJ SOLN
5000.0000 [IU] | Freq: Three times a day (TID) | INTRAMUSCULAR | Status: DC
  Administered 2017-12-24 – 2017-12-26 (×6): 5000 [IU] via SUBCUTANEOUS
  Filled 2017-12-24 (×6): qty 1

## 2017-12-24 MED ORDER — BUPROPION HCL ER (SR) 100 MG PO TB12: 100.0000 mg | ORAL_TABLET | Freq: Every day | ORAL | Status: DC

## 2017-12-24 NOTE — ED Provider Notes (Signed)
[facility] DEPARTMENT Provider Note   CSN: [id number] Arrival date & time: 12/24/17  0160     History   Chief Complaint Chief Complaint  Patient presents with  . Shortness of Breath    HPI Sharese Manrique is a 67 y.o. female.  Patient is a 67 year old female with a history of COPD, aortic atherosclerosis who is presenting today by EMS for shortness of breath.  In route patient was given magnesium, epi, albuterol, Atrovent, Solu-Medrol without significant improvement in her work of breathing and wheezing.  She was then started on BiPAP.  Patient was satting 90% when EMS arrived and was tripod breathing.  The history is provided by the patient. The history is limited by the condition of the patient.  Shortness of Breath  This is a recurrent problem. The average episode lasts 3 days. The problem occurs continuously.Episode onset: 3 days. The problem has been rapidly worsening. Associated symptoms include cough and chest pain. Pertinent negatives include no fever, no rhinorrhea, no sputum production, no vomiting, no abdominal pain, no leg pain and no leg swelling. It is unknown what precipitated the problem. Risk factors include smoking. She has tried beta-agonist inhalers and inhaled steroids for the symptoms. The treatment provided no relief. She has had prior hospitalizations. She has had prior ED visits. She has had no prior ICU admissions. Associated medical issues include COPD.    Past Medical History:  Diagnosis Date  . Acute respiratory failure with hypoxia ([place])   . Aortic atherosclerosis ([place])   . Arthritis    "a little in my hands probably" (10/31/2017)  . COPD (chronic obstructive pulmonary disease) ([place])   . COPD exacerbation ([place])   . History of kidney stones     Patient Active Problem List   Diagnosis Date Noted  . Acute respiratory failure with hypoxia ([place])   . COPD exacerbation ([place])   . Vitamin B12 deficiency 11/10/2017  .  Osteoporosis 11/10/2017  . Vitamin D deficiency 11/10/2017  . Altered mental status   . SIRS (systemic inflammatory response syndrome) ([place]) 10/30/2017  . Acute encephalopathy 10/29/2017  . Tobacco abuse 09/20/2017  . COPD (chronic obstructive pulmonary disease) ([place]) 09/15/2017    Past Surgical History:  Procedure Laterality Date  . ABDOMINAL HYSTERECTOMY    . APPENDECTOMY    . BREAST BIOPSY Left 1990s  . CATARACT EXTRACTION W/ INTRAOCULAR LENS  IMPLANT, BILATERAL Bilateral   . DILATION AND CURETTAGE OF UTERUS    . TONSILLECTOMY    . TUBAL LIGATION       OB History   None      Home Medications    Prior to Admission medications   Medication Sig Start Date End Date Taking? Authorizing Provider  acetaminophen (TYLENOL) 500 MG tablet Take 1,000 mg by mouth every 6 (six) hours as needed for mild pain or headache.    [provider]  albuterol (PROVENTIL HFA;VENTOLIN HFA) 108 (90 Base) MCG/ACT inhaler Inhale 2 puffs into the lungs every 6 (six) hours as needed for wheezing or shortness of breath. 09/16/17   Sheikh, Georgina Quint Latif, DO  albuterol (PROVENTIL) (2.5 MG/3ML) 0.083% nebulizer solution Take 3 mLs (2.5 mg total) by nebulization every 4 (four) hours as needed for wheezing or shortness of breath. 09/16/17   Raiford Noble Latif, DO  alendronate (FOSAMAX) 70 MG tablet Take 70 mg by mouth once a week. On Wednesdays 07/11/17   [provider]  buPROPion (WELLBUTRIN SR) 100 MG 12 hr tablet Take 1  tablet (100 mg total) by mouth daily. 11/03/17   Bonnielee Haff, MD  Fluticasone-Salmeterol (ADVAIR DISKUS) 100-50 MCG/DOSE AEPB Inhale 1 puff into the lungs 2 (two) times daily. 09/16/17 09/16/18  Raiford Noble Latif, DO  folic acid (FOLVITE) 1 MG tablet Take 1 tablet (1 mg total) by mouth daily. 11/02/17 11/02/18  Bonnielee Haff, MD  guaiFENesin (MUCINEX) 600 MG 12 hr tablet Take 2 tablets (1,200 mg total) by mouth 2 (two) times daily. 09/16/17   Raiford Noble Latif, DO  naproxen  sodium (ALEVE) 220 MG tablet Take 220 mg by mouth as needed (pain).    [provider]  predniSONE (STERAPRED UNI-PAK 21 TAB) 10 MG (21) TBPK tablet Take 6 Tablets Day 1, 5 Tablets Day 2, 4 Tablets Day 3, 3 Tablets Day 4, 2 Tablets Day 5, 1 Tablet Day 6, and Stop on Day 7 12/11/17   Raiford Noble Latif, DO  sodium chloride (OCEAN) 0.65 % SOLN nasal spray Place 1 spray into both nostrils as needed for congestion.    [provider]  tiotropium (SPIRIVA HANDIHALER) 18 MCG inhalation capsule Place 1 capsule (18 mcg total) into inhaler and inhale daily. 09/16/17 09/16/18  Raiford Noble Latif, DO  vitamin B-12 (CYANOCOBALAMIN) 1000 MCG tablet Take 1 tablet (1,000 mcg total) by mouth daily. Please start after patient has received 7 days of IM vitamin B12. 11/09/17   Bonnielee Haff, MD  Vitamin D, Ergocalciferol, (DRISDOL) 50000 units CAPS capsule Take 50,000 Units by mouth every 7 (seven) days.    [provider]    Family History Family History  Problem Relation Age of Onset  . Breast cancer Mother     Social History Social History   Tobacco Use  . Smoking status: Former Smoker    Packs/day: 1.50    Years: 51.00    Pack years: 76.50    Types: Cigarettes    Last attempt to quit: 09/05/2017    Years since quitting: 0.3  . Smokeless tobacco: Never Used  Substance Use Topics  . Alcohol use: Not Currently    Comment: 10/31/2017 "couple drinks/year"  . Drug use: Never     Allergies   Demerol [meperidine] and Oxycodone-acetaminophen   Review of Systems Review of Systems  Unable to perform ROS: Acuity of condition  Constitutional: Negative for fever.  HENT: Negative for rhinorrhea.   Respiratory: Positive for cough and shortness of breath. Negative for sputum production.   Cardiovascular: Positive for chest pain. Negative for leg swelling.  Gastrointestinal: Negative for abdominal pain and vomiting.     Physical Exam Updated Vital Signs BP (!) 147/77   Pulse  (!) 108   Resp (!) 36   SpO2 100%   Physical Exam  Constitutional: She is oriented to person, place, and time. She appears well-developed and well-nourished. She appears ill. No distress.  HENT:  Head: Normocephalic and atraumatic.  Mouth/Throat: Oropharynx is clear and moist.  Eyes: Pupils are equal, round, and reactive to light. Conjunctivae and EOM are normal.  Neck: Normal range of motion. Neck supple.  Cardiovascular: Regular rhythm and intact distal pulses. Tachycardia present.  No murmur heard. Pulmonary/Chest: Accessory muscle usage present. Tachypnea noted. No respiratory distress. She has decreased breath sounds. She has wheezes. She has no rales.  Abdominal: Soft. She exhibits no distension. There is no tenderness. There is no rebound and no guarding.  Musculoskeletal: Normal range of motion. She exhibits no edema or tenderness.  Neurological: She is alert and oriented to person, place, and  time.  Skin: Skin is warm and dry. No rash noted. No erythema.  Psychiatric: She has a normal mood and affect. Her behavior is normal.  Nursing note and vitals reviewed.    ED Treatments / Results  Labs (all labs ordered are listed, but only abnormal results are displayed) Labs Reviewed  CBC WITH DIFFERENTIAL/PLATELET - Abnormal; Notable for the following components:      Result Value   WBC 15.0 (*)    Neutro Abs 10.6 (*)    All other components within normal limits  COMPREHENSIVE METABOLIC PANEL - Abnormal; Notable for the following components:   Glucose, Bld 153 (*)    Calcium 8.5 (*)    Alkaline Phosphatase 128 (*)    All other components within normal limits  I-STAT VENOUS BLOOD GAS, ED - Abnormal; Notable for the following components:   pH, Ven 7.211 (*)    pCO2, Ven 64.2 (*)    pO2, Ven 97.0 (*)    Acid-base deficit 3.0 (*)    All other components within normal limits  I-STAT TROPONIN, ED  I-STAT VENOUS BLOOD GAS, ED    EKG None  Radiology Dg Chest Port 1  View  Result Date: 12/24/2017 CLINICAL DATA:  Shortness of breath, history of COPD, former smoker. EXAM: PORTABLE CHEST 1 VIEW COMPARISON:  Portable chest x-ray of December 11, 2017 FINDINGS: The lungs are hyperinflated. There is no infiltrate, pneumothorax, or pleural effusion. There is stable biapical pleural thickening. The heart and pulmonary vascularity are normal. There is calcification in the wall of the aortic arch. The trachea is midline. The bony thorax exhibits no acute abnormality. IMPRESSION: COPD. No pneumonia, CHF, nor other acute cardiopulmonary abnormality. Thoracic aortic atherosclerosis. Electronically Signed   By: David  Martinique M.D.   On: 12/24/2017 08:47    Procedures Procedures (including critical care time)  Medications Ordered in ED Medications  albuterol (PROVENTIL) (2.5 MG/3ML) 0.083% nebulizer solution (10 mg  Given 12/24/17 0837)     Initial Impression / Assessment and Plan / ED Course  I have reviewed the triage vital signs and the nursing notes.  Pertinent labs & imaging results that were available during my care of the patient were reviewed by me and considered in my medical decision making (see chart for details).    Patient is a 67 year old female with COPD presenting today with exacerbation.  Patient was tripoding when EMS arrived with sats at 90%.  Patient was given Solu-Medrol, magnesium, epi, albuterol, Atrovent with only minimal improvement but was then started on BiPAP.  Here on BiPAP patient is still tachypneic with mild accessory muscle use.  She has decreased breath sounds throughout and wheezing.  Will start patient on an hour-long neb.  Patient was recently discharged on 5 August for similar symptoms.  At that time she had a CT of her chest that was negative for PE.  She has had multiple scans in the past all which are negative for PE.  She is not able to provide much history due to her work of breathing.  Initial blood gas showed respiratory acidosis with  a pH of 7.21 and CO2 of 64.  Labs with a leukocytosis of 15,000 but x-ray clear for any evidence of pneumonia.  After 1 hour long neb and being on BiPAP we will repeat venous gas.  If patient is not improving feel that she will need to go to the ICU for close monitoring.  CRITICAL CARE Performed by: Romon Devereux Total critical care time: 30  minutes Critical care time was exclusive of separately billable procedures and treating other patients. Critical care was necessary to treat or prevent imminent or life-threatening deterioration. Critical care was time spent personally by me on the following activities: development of treatment plan with patient and/or surrogate as well as nursing, discussions with consultants, evaluation of patient's response to treatment, examination of patient, obtaining history from patient or surrogate, ordering and performing treatments and interventions, ordering and review of laboratory studies, ordering and review of radiographic studies, pulse oximetry and re-evaluation of patient's condition.  Final Clinical Impressions(s) / ED Diagnoses   Final diagnoses:  COPD exacerbation [facility])  Hypoxia    ED Discharge Orders    None       Blanchie Dessert, MD 12/24/17 1014

## 2017-12-24 NOTE — ED Triage Notes (Signed)
Pt here from home with c/o resp distress times 2 days  Pt  Received  15mg   Albuterol  1mg  atrovent , mag , 125mg  solumerol , and epi Im pt arrived on c pap     Recent Vital Signs   There were no vitals taken for this visit.      Expected Discharge Date     Diet Order    None       VTE Documentation      Work Intensity Score/Level of Care     Mobility        Significant Events    DC Barriers   Abnormal Labs:

## 2017-12-24 NOTE — H&P (Signed)
Name: Lori Butler MRN: [id number] DOB: 10/21/1950    ADMISSION DATE:  12/24/2017  REFERRING MD :  EDP   CHIEF COMPLAINT:  Respiratory failure   BRIEF PATIENT DESCRIPTION: 67 year old female former smoker (quit May 2018) with history of moderate COPD (FEV1 51%, FVC 65%, DLCO 57% 10/2017) followed by Dr. Elsworth Soho in the pulmonary office managed as outpatient on Advair and Spiriva with recent admission 8/4-8/6 for AE COPD returns 8/19 with 3-day history of progressive shortness of breath, cough with increased sputum production.  She was in significant distress on EMS arrival and in the emergency room requiring BiPAP, magnesium, epi, continuous neb, Solu-Medrol.  PCCM consulted for admission.  SIGNIFICANT EVENTS    STUDIES:     HISTORY OF PRESENT ILLNESS:  67 year old female former smoker (quit May 2018) with history of moderate COPD (FEV1 51%, FVC 65%, DLCO 57% 10/2017) followed by Dr. Elsworth Soho in the pulmonary office managed as outpatient on Advair and Spiriva with recent admission 8/4-8/6 for AE COPD returns 8/19 with 3-day history of progressive shortness of breath, cough with increased sputum production.  She was in significant distress on EMS arrival and in the emergency room requiring BiPAP, magnesium, epi, continuous neb, Solu-Medrol.  PCCM consulted for admission.  On my exam patient remains on BiPAP but is feeling much better.  Tripod positioning resolved per RN. Patient denies chest pain, fever, hemoptysis, leg or calf pain, BLE edema, orthopnea.   No known sick contacts, no recent travel.   PAST MEDICAL HISTORY :   has a past medical history of Acute respiratory failure with hypoxia ([place]), Aortic atherosclerosis ([place]), Arthritis, COPD (chronic obstructive pulmonary disease) ([place]), COPD exacerbation ([place]), and History of kidney stones.  has a past surgical history that includes Appendectomy; Tonsillectomy; Breast biopsy (Left, 1990s); Cataract extraction w/ intraocular lens  implant,  bilateral (Bilateral); Abdominal hysterectomy; Dilation and curettage of uterus; and Tubal ligation. Prior to Admission medications   Medication Sig Start Date End Date Taking? Authorizing Provider  acetaminophen (TYLENOL) 500 MG tablet Take 1,000 mg by mouth every 6 (six) hours as needed for mild pain or headache.    [provider]  albuterol (PROVENTIL HFA;VENTOLIN HFA) 108 (90 Base) MCG/ACT inhaler Inhale 2 puffs into the lungs every 6 (six) hours as needed for wheezing or shortness of breath. 09/16/17   Sheikh, Georgina Quint Latif, DO  albuterol (PROVENTIL) (2.5 MG/3ML) 0.083% nebulizer solution Take 3 mLs (2.5 mg total) by nebulization every 4 (four) hours as needed for wheezing or shortness of breath. 09/16/17   Raiford Noble Latif, DO  alendronate (FOSAMAX) 70 MG tablet Take 70 mg by mouth once a week. On Wednesdays 07/11/17   [provider]  buPROPion (WELLBUTRIN SR) 100 MG 12 hr tablet Take 1 tablet (100 mg total) by mouth daily. 11/03/17   Bonnielee Haff, MD  Fluticasone-Salmeterol (ADVAIR DISKUS) 100-50 MCG/DOSE AEPB Inhale 1 puff into the lungs 2 (two) times daily. 09/16/17 09/16/18  Raiford Noble Latif, DO  folic acid (FOLVITE) 1 MG tablet Take 1 tablet (1 mg total) by mouth daily. 11/02/17 11/02/18  Bonnielee Haff, MD  guaiFENesin (MUCINEX) 600 MG 12 hr tablet Take 2 tablets (1,200 mg total) by mouth 2 (two) times daily. 09/16/17   Raiford Noble Latif, DO  naproxen sodium (ALEVE) 220 MG tablet Take 220 mg by mouth as needed (pain).    [provider]  predniSONE (STERAPRED UNI-PAK 21 TAB) 10 MG (21) TBPK tablet Take 6 Tablets Day 1, 5 Tablets Day 2, 4  Tablets Day 3, 3 Tablets Day 4, 2 Tablets Day 5, 1 Tablet Day 6, and Stop on Day 7 12/11/17   Raiford Noble Latif, DO  sodium chloride (OCEAN) 0.65 % SOLN nasal spray Place 1 spray into both nostrils as needed for congestion.    [provider]  tiotropium (SPIRIVA HANDIHALER) 18 MCG inhalation capsule Place 1 capsule (18  mcg total) into inhaler and inhale daily. 09/16/17 09/16/18  Raiford Noble Latif, DO  vitamin B-12 (CYANOCOBALAMIN) 1000 MCG tablet Take 1 tablet (1,000 mcg total) by mouth daily. Please start after patient has received 7 days of IM vitamin B12. 11/09/17   Bonnielee Haff, MD  Vitamin D, Ergocalciferol, (DRISDOL) 50000 units CAPS capsule Take 50,000 Units by mouth every 7 (seven) days.    [provider]   Allergies  Allergen Reactions  . Demerol [Meperidine] Other (See Comments)    "horrible reaction"  . Oxycodone-Acetaminophen Other (See Comments)    Upset stomach     FAMILY HISTORY:  family history includes Breast cancer in her mother. SOCIAL HISTORY:  reports that she quit smoking about 3 months ago. Her smoking use included cigarettes. She has a 76.50 pack-year smoking history. She has never used smokeless tobacco. She reports that she drank alcohol. She reports that she does not use drugs.  REVIEW OF SYSTEMS:   As per HPI - All other systems reviewed and were neg.    SUBJECTIVE:   VITAL SIGNS: Pulse Rate:  [108-109] 108 (08/19 0900) Resp:  [33-36] 36 (08/19 0900) BP: (140-147)/(77-94) 147/77 (08/19 0900) SpO2:  [100 %] 100 % (08/19 0900) FiO2 (%):  [40 %] 40 % (08/19 0838)  PHYSICAL EXAMINATION: General: Chronically ill-appearing female, no acute distress on BiPAP Neuro: Awake, alert, appropriate HEENT: Moist, BiPAP mask Cardiovascular: S1 S2 RRR, sinus tach Lungs: Respirations are even, nonlabored, mild tachypnea on BiPAP, diminished air movement throughout Abdomen: Soft, nontender, positive bowel sounds Musculoskeletal: Warm and dry, no edema   Recent Labs  Lab 12/24/17 0829  NA 141  K 4.5  CL 108  CO2 25  BUN 11  CREATININE 0.63  GLUCOSE 153*   Recent Labs  Lab 12/24/17 0829  HGB 12.5  HCT 40.3  WBC 15.0*  PLT 321   Dg Chest Port 1 View  Result Date: 12/24/2017 CLINICAL DATA:  Shortness of breath, history of COPD, former smoker. EXAM:  PORTABLE CHEST 1 VIEW COMPARISON:  Portable chest x-ray of December 11, 2017 FINDINGS: The lungs are hyperinflated. There is no infiltrate, pneumothorax, or pleural effusion. There is stable biapical pleural thickening. The heart and pulmonary vascularity are normal. There is calcification in the wall of the aortic arch. The trachea is midline. The bony thorax exhibits no acute abnormality. IMPRESSION: COPD. No pneumonia, CHF, nor other acute cardiopulmonary abnormality. Thoracic aortic atherosclerosis. Electronically Signed   By: David  Martinique M.D.   On: 12/24/2017 08:47    ASSESSMENT / PLAN:  Acute on chronic respiratory failure  AECOPD Moderate to severe COPD Plan- Admit to stepdown unit Continue BiPAP for now Follow-up ABG IV steroids Scheduled nebulized bronchodilators Follow-up CBC, BMET, Mg in a.m. We will add empiric abx given recent hospitalization-can narrow quickly, no infiltrate on chest x-ray Mobilize as able Would benefit from outpatient pulmonary rehab N.p.o. for now Holding home Advair, Spiriva  Anxiety Plan- Low-dose PRN Xanax continue home Wellbutrin   DVT proph- SQ heparin  No family at bedside 8/19 in ER.  Patient awake alert and oriented, updated on plan of  care    Nickolas Madrid, NP 12/24/2017  12:14 PM Pager: [phone] or [phone]

## 2017-12-24 NOTE — ED Notes (Signed)
Pt and family updated on plan of care , RT attempted to take pt off bipap , pt did not tolerate well and placed back on bipap , resting at this time

## 2017-12-24 NOTE — ED Notes (Signed)
Pt remains on Bipap , resting now resp rate down to 24

## 2017-12-24 NOTE — Progress Notes (Signed)
RT took patient off BIPAP and placed on 2L [place]. Patient tolerating well at this time. No respiratory distress noted. RT will monitor as needed.

## 2017-12-25 ENCOUNTER — Inpatient Hospital Stay (HOSPITAL_COMMUNITY): Payer: Medicare HMO

## 2017-12-25 LAB — CBC
HCT: 35.9 % — ABNORMAL LOW (ref 36.0–46.0)
Hemoglobin: 11.4 g/dL — ABNORMAL LOW (ref 12.0–15.0)
MCH: 29.9 pg (ref 26.0–34.0)
MCHC: 31.8 g/dL (ref 30.0–36.0)
MCV: 94.2 fL (ref 78.0–100.0)
PLATELETS: 265 10*3/uL (ref 150–400)
RBC: 3.81 MIL/uL — ABNORMAL LOW (ref 3.87–5.11)
RDW: 13.5 % (ref 11.5–15.5)
WBC: 10.9 10*3/uL — ABNORMAL HIGH (ref 4.0–10.5)

## 2017-12-25 LAB — BLOOD GAS, ARTERIAL
Acid-base deficit: 2 mmol/L (ref 0.0–2.0)
Bicarbonate: 21.7 mmol/L (ref 20.0–28.0)
DRAWN BY: 419771
O2 Content: 2 L/min
O2 Saturation: 97.8 %
PH ART: 7.427 (ref 7.350–7.450)
PO2 ART: 94.6 mmHg (ref 83.0–108.0)
Patient temperature: 98.6
pCO2 arterial: 33.5 mmHg (ref 32.0–48.0)

## 2017-12-25 LAB — PHOSPHORUS: Phosphorus: 2.3 mg/dL — ABNORMAL LOW (ref 2.5–4.6)

## 2017-12-25 LAB — BASIC METABOLIC PANEL
Anion gap: 9 (ref 5–15)
BUN: 14 mg/dL (ref 8–23)
CO2: 24 mmol/L (ref 22–32)
CREATININE: 0.55 mg/dL (ref 0.44–1.00)
Calcium: 8.4 mg/dL — ABNORMAL LOW (ref 8.9–10.3)
Chloride: 108 mmol/L (ref 98–111)
GFR calc Af Amer: >60 mL/min (ref >60)
Glucose, Bld: 117 mg/dL — ABNORMAL HIGH (ref 70–99)
Potassium: 5 mmol/L (ref 3.5–5.1)
Sodium: 141 mmol/L (ref 135–145)

## 2017-12-25 LAB — MRSA PCR SCREENING: MRSA BY PCR: NEGATIVE

## 2017-12-25 LAB — MAGNESIUM: MAGNESIUM: 2.4 mg/dL (ref 1.7–2.4)

## 2017-12-25 MED ORDER — PREDNISONE 20 MG PO TABS
40.0000 mg | ORAL_TABLET | Freq: Every day | ORAL | Status: DC
  Administered 2017-12-25 – 2017-12-26 (×2): 40 mg via ORAL
  Filled 2017-12-25 (×2): qty 2

## 2017-12-25 MED ORDER — PANTOPRAZOLE SODIUM 40 MG PO TBEC
40.0000 mg | DELAYED_RELEASE_TABLET | Freq: Two times a day (BID) | ORAL | Status: DC
  Administered 2017-12-25 – 2017-12-26 (×3): 40 mg via ORAL
  Filled 2017-12-25 (×3): qty 1

## 2017-12-25 MED ORDER — MONTELUKAST SODIUM 10 MG PO TABS
10.0000 mg | ORAL_TABLET | Freq: Every day | ORAL | Status: DC
  Administered 2017-12-25: 10 mg via ORAL
  Filled 2017-12-25: qty 1

## 2017-12-25 NOTE — Progress Notes (Signed)
Lori Butler  NAT:[id number] DOB: 12/22/1950 DOA: 12/24/2017 PCP: [name], MD    Reason for Consult/Chief Complaint:  Acute on chronic respiratory failure  Consulting MD:  None, admitted from our office.  HPI/Brief Narrative   67 year old former smoker with history of moderate COPD (FEV1 51% predicted), recently discharged following hospital stay from 8/4 to 8/6 for acute exacerbation of COPD.  Readmitted on 8/19 with working diagnosis of recurrent acute exacerbation of chronic obstructive pulmonary disease requiring BiPAP 8/20: BiPAP removed.  Placed on nasal cannula.  No wheezing on morning exam.  Assessment & Plan:  Acute on chronic respiratory failure in the setting of acute exacerbation chronic obstructive pulmonary disease Moderate to severe COPD Episodes seem to happen primarily at night.  Rather acute in onset.  Think that there is a strong reactive airway component to her acute decompensations.  Not wheezing at all this morning. Plan Continuing DuoNeb every 6 hours Continue budesonide twice daily Discontinue methylprednisone, changed to redness on 40 mg daily, we will start this today We will start Singulair Empirically start PPI twice daily We discussed an action plan in regards to use of rescue bronchodilators and when to call pulmonary office  Anxiety Plan Continue PRN Xanax and every 12 hour Wellbutrin  Borderline anemia Plan Follow-up as outpatient  Best practice/Goals of care/disposition.   DVT prophylaxis: Subcutaneous heparin GI prophylaxis: None Diet: Regular Mobility: Mobility with assistance Code Status: Full code Family Communication: Pending  Disposition/ summary of today's plan:    We will stepdown her systemic steroids today, add Singulair and PPI.  If she continues to improve she may be able to go home in the next 24 to 48 hours.  She is going to need closer outpatient monitoring.  Consultants:  None  Procedures:   Significant  diagnostic tests:   Micro data: MRSA PCR screen on 8/19: Negative  Antimicrobials:    Subjective  Anxious this morning, frustrated as she is taking her medications as prescribed, feels like she is doing all the things that she was instructed to do yet still ends up in the hospital  Objective    Blood pressure 112/73, pulse 89, temperature 98.2 F (36.8 C), temperature source Oral, resp. rate 14, height 5\' 5"  (1.651 m), weight 68.2 kg, SpO2 98 %.    FiO2 (%):  [24 %-40 %] 24 %   Intake/Output Summary (Last 24 hours) at 12/25/2017 0854 Last data filed at 12/25/2017 4270 Gross per 24 hour  Intake no documentation  Output 1100 ml  Net -1100 ml   Filed Weights   12/24/17 2248  Weight: 68.2 kg    Examination: General: Pleasant 67 year old white female currently resting in bed she is tearful but in no acute distress HENT: Normocephalic atraumatic no jugular venous distention no upper airway wheezing Lungs: Clear to auscultation no accessory use Cardiovascular: Regular rate and rhythm without murmur rub or gallop Abdomen: Soft nontender no organomegaly positive bowel sounds Extremities: Brisk capillary refill no edema warm dry strong pulses Neuro: Awake oriented no focal deficits GU: Voiding  Labs   CBC: Recent Labs  Lab 12/24/17 0829 12/25/17 0210  WBC 15.0* 10.9*  NEUTROABS 10.6*  --   HGB 12.5 11.4*  HCT 40.3 35.9*  MCV 95.5 94.2  PLT 321 623   Basic Metabolic Panel: Recent Labs  Lab 12/24/17 0829 12/25/17 0210  NA 141 141  K 4.5 5.0  CL 108 108  CO2 25 24  GLUCOSE 153* 117*  BUN 11  14  CREATININE 0.63 0.55  CALCIUM 8.5* 8.4*  MG  --  2.4  PHOS  --  2.3*   GFR: Estimated Creatinine Clearance: 60.6 mL/min (by C-G formula based on SCr of 0.55 mg/dL). Recent Labs  Lab 12/24/17 0829 12/25/17 0210  WBC 15.0* 10.9*   Liver Function Tests: Recent Labs  Lab 12/24/17 0829  AST 23  ALT 27  ALKPHOS 128*  BILITOT 0.7  PROT 6.8  ALBUMIN 3.8   No  results for input(s): LIPASE, AMYLASE in the last 168 hours. No results for input(s): AMMONIA in the last 168 hours. ABG    Component Value Date/Time   PHART 7.427 12/25/2017 0419   PCO2ART 33.5 12/25/2017 0419   PO2ART 94.6 12/25/2017 0419   HCO3 21.7 12/25/2017 0419   TCO2 25 12/24/2017 1148   ACIDBASEDEF 2.0 12/25/2017 0419   O2SAT 97.8 12/25/2017 0419    Coagulation Profile: No results for input(s): INR, PROTIME in the last 168 hours. Cardiac Enzymes: No results for input(s): CKTOTAL, CKMB, CKMBINDEX, TROPONINI in the last 168 hours. HbA1C: Hgb A1c MFr Bld  Date/Time Value Ref Range Status  12/10/2017 02:40 AM 5.0 4.8 - 5.6 % Final    Comment:    (NOTE) Pre diabetes:          5.7%-6.4% Diabetes:              >6.4% Glycemic control for   <7.0% adults with diabetes    CBG: No results for input(s): GLUCAP in the last 168 hours.

## 2017-12-25 NOTE — Discharge Summary (Addendum)
Physician Discharge Summary         Patient ID: Lori Butler MRN: [id number] DOB/AGE: 1951-02-16 67 y.o.  Admit date: 12/24/2017 Discharge date: 12/26/2017  Discharge Diagnoses:    Acute on chronic respiratory failure in the setting of acute exacerbation chronic obstructive pulmonary disease Moderate to severe COPD Asthmatic bronchitis Anxiety Borderline anemia Physical deconditioning   Discharge summary    67 year old former smoker with history of moderate COPD (FEV1 51% predicted), recently discharged following hospital stay from 8/4 to 8/6 for acute exacerbation of COPD.  Readmitted on 8/19 with working diagnosis of recurrent acute exacerbation of chronic obstructive pulmonary disease requiring BiPAP.  On 8/20, she was able to come off BiPAP and placed on nasal cannula.  No wheezing on morning exam.  She was transitioned to oral prednisone, and started on Singulair and empiric PPI twice daily.  8/21 without events.  She remains on room air without wheezing.  Patient initially felt weak and tired this morning and therefore hesitant to go home. She lives alone.  She was evaluated by occupational therapy with no recommendations and physical therapy with home health PT.  Per case management, she did not meet SNF criteria but was set up for home health to see her within 24 - 48 hours of discharge.    Discharge Plan by Active Problems     Acute on chronic respiratory failure in the setting of acute exacerbation chronic obstructive pulmonary disease Moderate to severe COPD Plan Continue Spiriva inhaler Stop Advair and start Symbicort Continue albuterol prn  Continue steroid taper Continue singular daily  Continue Protonix 40 mg twice daily Home health to see patient at home with assistance w/ RN, aide, and RT Follow-up in office with Kenney Houseman, NP on 8/29 at 10 am  Anxiety  Plan Follow up with primary care provider if needed  Boarderline anemia  Plan  F/u outpt    Arthritis Plan Avoid NSAIDs with possible reflux, use tylenol as needed   Physical deconditioning Plan Home health PT  Encourage good nutrition  Significant Hospital tests/ studies  none  Culture data/antimicrobials   none   Consults    PT/ OT/ case management  Discharge Exam: BP 130/75 (BP Location: Right Arm)   Pulse 85   Temp 98.3 F (36.8 C) (Oral)   Resp 18   Ht 5\' 5"  (1.651 m)   Wt 68.2 kg   SpO2 96%   BMI 25.02 kg/m   General:  Pleasant, older adult female sitting in bedside chair, NAD, mildly anxious appearing HEENT: MM pink/moist, no JVD Neuro:  Awake, oriented, MAE CV: rrr, no m/r/g PULM: even/non-labored, lungs bilaterally clear, no wheeze GI: soft, NT, +BS Extremities: warm/dry, no edema  Skin: no rashes  Labs at discharge   Lab Results  Component Value Date   CREATININE 0.55 12/25/2017   BUN 14 12/25/2017   NA 141 12/25/2017   K 5.0 12/25/2017   CL 108 12/25/2017   CO2 24 12/25/2017   Lab Results  Component Value Date   WBC 10.9 (H) 12/25/2017   HGB 11.4 (L) 12/25/2017   HCT 35.9 (L) 12/25/2017   MCV 94.2 12/25/2017   PLT 265 12/25/2017   Lab Results  Component Value Date   ALT 27 12/24/2017   AST 23 12/24/2017   ALKPHOS 128 (H) 12/24/2017   BILITOT 0.7 12/24/2017   No results found for: INR, PROTIME  Current radiological studies    Dg Chest Port 1 View  Result Date: 12/25/2017 CLINICAL DATA:  Cough.  Shortness of breath. EXAM: PORTABLE CHEST 1 VIEW COMPARISON:  12/24/2017. FINDINGS: Mediastinum hilar structures normal. Cardiomegaly with normal pulmonary vascularity. No focal infiltrate. No pleural effusion or pneumothorax. IMPRESSION: 1.  Cardiomegaly.  No pulmonary venous congestion. 2.  No focal infiltrate Electronically Signed   By: Marcello Moores  Register   On: 12/25/2017 07:17    Disposition:    Discharge disposition: 01-Home or Self Care     Patient is going home with her son, as she lives alone until home health  starts.  Discharge Instructions    AMB Referral to [facility]   Complete by:  As directed    Please assign patient to [facility] for COPD disease management.  Please assign patient to [facility] worker for difficulty with affording medications and hospital bills, and assign pharmacist for medication cost of inhalers, having to borrow $$, current medications may change also she states.  Natividad Brood, RN BSN [facility] Liaison  [phone] business mobile phone Toll free office [phone]   Reason for consult:  COPD, difficulty affording med   Diagnoses of:  COPD/ Pneumonia   Expected date of contact:  1-3 days (reserved for hospital discharges)   Diet - low sodium heart healthy   Complete by:  As directed    Increase activity slowly   Complete by:  As directed      Allergies as of 12/26/2017      Reactions   Demerol [meperidine] Other (See Comments)   "horrible reaction"   Oxycodone-acetaminophen Other (See Comments)   Upset stomach       Medication List    STOP taking these medications   Fluticasone-Salmeterol 100-50 MCG/DOSE Aepb Commonly known as:  ADVAIR   folic acid 1 MG tablet Commonly known as:  FOLVITE   naproxen sodium 220 MG tablet Commonly known as:  ALEVE   vitamin B-12 1000 MCG tablet Commonly known as:  CYANOCOBALAMIN     TAKE these medications   acetaminophen 500 MG tablet Commonly known as:  TYLENOL Take 1,000 mg by mouth every 6 (six) hours as needed for mild pain or headache.   albuterol (2.5 MG/3ML) 0.083% nebulizer solution Commonly known as:  PROVENTIL Take 3 mLs (2.5 mg total) by nebulization every 4 (four) hours as needed for wheezing or shortness of breath.   albuterol 108 (90 Base) MCG/ACT inhaler Commonly known as:  PROVENTIL HFA;VENTOLIN HFA Inhale 2 puffs into the lungs every 6 (six) hours as needed for wheezing or shortness of breath.   alendronate 70 MG tablet Commonly known as:   FOSAMAX Take 70 mg by mouth every Wednesday.   budesonide-formoterol 160-4.5 MCG/ACT inhaler Commonly known as:  SYMBICORT Inhale 2 puffs into the lungs 2 (two) times daily.   buPROPion 100 MG 12 hr tablet Commonly known as:  WELLBUTRIN SR Take 1 tablet (100 mg total) by mouth daily.   guaiFENesin 600 MG 12 hr tablet Commonly known as:  MUCINEX Take 2 tablets (1,200 mg total) by mouth 2 (two) times daily.   montelukast 10 MG tablet Commonly known as:  SINGULAIR Take 1 tablet (10 mg total) by mouth at bedtime.   pantoprazole 40 MG tablet Commonly known as:  PROTONIX Take 1 tablet (40 mg total) by mouth 2 (two) times daily.   predniSONE 10 MG tablet Commonly known as:  DELTASONE 10 mg tabs, Take 4 tabs daily x 4 days, then 3 tabs daily x 4 days, then 2 tabs daily x 4  days, then 1 tab daily x4 days then stop.   tiotropium 18 MCG inhalation capsule Commonly known as:  SPIRIVA Place 1 capsule (18 mcg total) into inhaler and inhale daily.   Vitamin D (Ergocalciferol) 50000 units Caps capsule Commonly known as:  DRISDOL Take 50,000 Units by mouth every Wednesday.       Contact information for follow-up providers    Fenton Foy, NP. Go on 01/03/2018.   Specialty:  Pulmonary Disease Why:  at 10 am.  Please call the office sooner if problems arise or if you can not make this appointment.  Contact information: [address] [place] [address] [phone]            Contact information for after-discharge care    Destination    [facility] Preferred SNF .   Service:  Skilled Nursing Contact information: [address] [place] [phone]                  Discharge Condition:    stable  Time spent on discharge 40 mins  Kennieth Rad, AGACNP-BC [place] Pgr: [phone] or if no answer [phone] 12/26/2017, 4:00 PM  Attending Note:  Consulted with case management and  social work, PT was called and patient is too functional for inpatient rehab.  After discussion amongst patient, PT and case management, we will be able to get home health to see patient at home and assist.  Patient is comfortable with that and we will discharge.  Rush Farmer, M.D. [facility] Pulmonary/Critical Care Medicine. Pager: [phone]. After hours pager: [phone]

## 2017-12-25 NOTE — Consult Note (Signed)
   [facility] Inpatient Consult   12/25/2017  Lori Butler [birth year] [id number]   Patient evaluated for community based chronic disease management services with [facility] as a benefit of patient's [facility]. Spoke with patient at bedside to explain [facility] services. Patient became tearful and states, "I have been sick this year, I have never had such problems.  I have COPD and a whole lot of those inhaler and medications I can not afford.  (Became tearful)  I had to borrow a $1000.00 just to get them and this is such a burden for me as I live alone and take care of my bills alone."  Asked her which medication was the issue.  She states, "when you add them all up they are all getting out of my range."   Explained [facility] social worker to assist with resources/financial assessment and also [facility] Pharmacist to evaluate and see if [employer] has any assistance for her inhalers.  Consent form signed and folder given.  Patient will receive post hospital discharge call and will be evaluated for monthly home visits for assessments and disease process education.  Made Inpatient Case Manager aware that [facility] following. Of note, [facility] Care Management services does not replace or interfere with any services that are arranged by inpatient case management or social work.  For additional questions or referrals please contact:    Natividad Brood, RN BSN [facility] Liaison  [phone] business mobile phone Toll free office [phone]

## 2017-12-25 NOTE — Progress Notes (Signed)
SATURATION QUALIFICATIONS: (This note is used to comply with regulatory documentation for home oxygen)  Patient Saturations on Room Air at Rest = 97%  Patient Saturations on Room Air while Ambulating = 95%   Please briefly explain why patient needs home oxygen: Pt does not need oxygen at home

## 2017-12-26 DIAGNOSIS — R0902 Hypoxemia: Secondary | ICD-10-CM

## 2017-12-26 DIAGNOSIS — R5381 Other malaise: Secondary | ICD-10-CM

## 2017-12-26 DIAGNOSIS — J9622 Acute and chronic respiratory failure with hypercapnia: Secondary | ICD-10-CM

## 2017-12-26 MED ORDER — TIOTROPIUM BROMIDE MONOHYDRATE 18 MCG IN CAPS
18.0000 ug | ORAL_CAPSULE | Freq: Every day | RESPIRATORY_TRACT | Status: DC
  Filled 2017-12-26: qty 5

## 2017-12-26 MED ORDER — BUDESONIDE-FORMOTEROL FUMARATE 160-4.5 MCG/ACT IN AERO: 2.0000 | INHALATION_SPRAY | Freq: Two times a day (BID) | RESPIRATORY_TRACT | 12 refills | Status: DC

## 2017-12-26 MED ORDER — PREDNISONE 10 MG PO TABS: ORAL_TABLET | ORAL | 0 refills | Status: DC

## 2017-12-26 MED ORDER — MONTELUKAST SODIUM 10 MG PO TABS: 10.0000 mg | ORAL_TABLET | Freq: Every day | ORAL | 1 refills | Status: AC

## 2017-12-26 MED ORDER — MOMETASONE FURO-FORMOTEROL FUM 200-5 MCG/ACT IN AERO
2.0000 | INHALATION_SPRAY | Freq: Two times a day (BID) | RESPIRATORY_TRACT | Status: DC
  Filled 2017-12-26: qty 8.8

## 2017-12-26 MED ORDER — IPRATROPIUM-ALBUTEROL 0.5-2.5 (3) MG/3ML IN SOLN
3.0000 mL | Freq: Two times a day (BID) | RESPIRATORY_TRACT | Status: DC
  Filled 2017-12-26: qty 3

## 2017-12-26 MED ORDER — PANTOPRAZOLE SODIUM 40 MG PO TBEC: 40.0000 mg | DELAYED_RELEASE_TABLET | Freq: Two times a day (BID) | ORAL | 1 refills | Status: AC

## 2017-12-26 NOTE — Progress Notes (Signed)
Pt seen by MD & NP, orders written for d/c.  Went over discharge instructions with pt and answered all questions.  Removed IV & telemetry, no complications.  Escorted for discharge via wheelchair with all belongings.  Will stay with son until home care can see her.  Will follow up outpatient with MD and home care.

## 2017-12-26 NOTE — Evaluation (Signed)
Physical Therapy Evaluation & Discharge Patient Details Name: Lori Butler MRN: [id number] DOB: July 29, 1950 Today's Date: 12/26/2017   History of Present Illness  Pt is a 67 y.o. female admitted 12/24/17 with COPD exacerbation requiring BiPAP. Pt with recent admission 8/4-8/6 with similar exacerbation. PMH includes anxiety, severe COPD.    Clinical Impression  Patient evaluated by Physical Therapy with no further acute PT needs identified. PTA, pt indep although anxious about returning home without support available. Today, pt ambulating independently although demonstrating decreased activity tolerance and fatigue; DOE 2/4, SpO2 >92% on RA. All education has been completed and the patient has no further questions. Would benefit from HHPT services to maximize functional mobility and independence upon return home. Acute PT is signing off. Thank you for this referral.    Follow Up Recommendations Home health PT;Supervision - Intermittent    Equipment Recommendations  None recommended by PT    Recommendations for Other Services       Precautions / Restrictions Precautions Precautions: None Restrictions Weight Bearing Restrictions: No      Mobility  Bed Mobility Overal bed mobility: Independent                Transfers Overall transfer level: Independent                  Ambulation/Gait Ambulation/Gait assistance: Independent Gait Distance (Feet): 350 Feet Assistive device: None Gait Pattern/deviations: Step-through pattern Gait velocity: Decreased   General Gait Details: Initial use of RW for stability, progressing away from this. Required 2x standing rest break due to DOE/fatigue. SpO2 >92% on RA throughout  Stairs Stairs: (Able to high march with no UE support to simulate ascending 2 steps into home)          Wheelchair Mobility    Modified Rankin (Stroke Patients Only)       Balance Overall balance assessment: Needs assistance   Sitting  balance-Leahy Scale: Normal       Standing balance-Leahy Scale: Good               High level balance activites: Side stepping;Backward walking;Direction changes;Turns;Sudden stops;Head turns High Level Balance Comments: No overt instability or LOB with higher level balance tasks             Pertinent Vitals/Pain Pain Assessment: No/denies pain    Home Living Family/patient expects to be discharged to:: Private residence Living Arrangements: Alone   Type of Home: Apartment Home Access: Stairs to enter Entrance Stairs-Rails: None Entrance Stairs-Number of Steps: 2 Home Layout: One level Home Equipment: None      Prior Function Level of Independence: Independent         Comments: Was working Investment banker, corporate at [employer], hoping to return once health improves. Only drives from home<>[employer]     Hand Dominance        Extremity/Trunk Assessment   Upper Extremity Assessment Upper Extremity Assessment: Overall WFL for tasks assessed    Lower Extremity Assessment Lower Extremity Assessment: Overall WFL for tasks assessed    Cervical / Trunk Assessment Cervical / Trunk Assessment: Normal  Communication   Communication: No difficulties  Cognition Arousal/Alertness: Awake/alert Behavior During Therapy: Anxious Overall Cognitive Status: Within Functional Limits for tasks assessed                                        General Comments      Exercises  Assessment/Plan    PT Assessment All further PT needs can be met in the next venue of care  PT Problem List Decreased mobility;Decreased activity tolerance;Cardiopulmonary status limiting activity       PT Treatment Interventions Gait training;Therapeutic activities;Therapeutic exercise;Patient/family education;Stair training    PT Goals (Current goals can be found in the Care Plan section)  Acute Rehab PT Goals PT Goal Formulation: All assessment and education complete, DC  therapy    Frequency     Barriers to discharge        Co-evaluation               AM-PAC PT "6 Clicks" Daily Activity  Outcome Measure Difficulty turning over in bed (including adjusting bedclothes, sheets and blankets)?: None Difficulty moving from lying on back to sitting on the side of the bed? : None Difficulty sitting down on and standing up from a chair with arms (e.g., wheelchair, bedside commode, etc,.)?: None Help needed moving to and from a bed to chair (including a wheelchair)?: None Help needed walking in hospital room?: None Help needed climbing 3-5 steps with a railing? : A Little 6 Click Score: 23    End of Session Equipment Utilized During Treatment: Gait belt Activity Tolerance: Patient tolerated treatment well;Patient limited by fatigue Patient left: in chair;with call bell/phone within reach Nurse Communication: Mobility status PT Visit Diagnosis: Other abnormalities of gait and mobility (R26.89)    Time: 0221-7981 PT Time Calculation (min) (ACUTE ONLY): 22 min   Charges:   PT Evaluation $PT Eval Low Complexity: 1 Low         Mabeline Caras, PT, DPT Acute Rehab Services  Pager: [place] 12/26/2017, 1:46 PM

## 2017-12-26 NOTE — Evaluation (Signed)
Occupational Therapy Evaluation and Discharge Patient Details Name: Lori Butler MRN: [id number] DOB: 1951-03-08 Today's Date: 12/26/2017    History of Present Illness Pt is a 67 y.o. female admitted 12/24/17 with COPD exacerbation requiring BiPAP. Pt with recent admission 8/4-8/6 with similar exacerbation. PMH includes anxiety, severe COPD.   Clinical Impression   Pt is functioning at a modified independently in ADL. Pt with Sp02 >92% on RA with ambulation. Pt is knowledgeable in energy conservation strategies. No further OT needs.    Follow Up Recommendations  No OT follow up    Equipment Recommendations  None recommended by OT    Recommendations for Other Services       Precautions / Restrictions Precautions Precautions: None Restrictions Weight Bearing Restrictions: No      Mobility Bed Mobility Overal bed mobility: Independent                Transfers Overall transfer level: Independent                    Balance Overall balance assessment: Needs assistance   Sitting balance-Leahy Scale: Normal       Standing balance-Leahy Scale: Good               High level balance activites: Side stepping;Backward walking;Direction changes;Turns;Sudden stops;Head turns High Level Balance Comments: No overt instability or LOB with higher level balance tasks           ADL either performed or assessed with clinical judgement   ADL Overall ADL's : Modified independent                                       General ADL Comments: pt is tentative with ambulation, reports fatigue and weakness. Pt is knowledgeable in energy conservation strategies when questioned.      Vision Baseline Vision/History: Wears glasses Wears Glasses: Reading only Patient Visual Report: No change from baseline       Perception     Praxis      Pertinent Vitals/Pain Pain Assessment: No/denies pain     Hand Dominance Right   Extremity/Trunk  Assessment Upper Extremity Assessment Upper Extremity Assessment: Overall WFL for tasks assessed   Lower Extremity Assessment Lower Extremity Assessment: Overall WFL for tasks assessed   Cervical / Trunk Assessment Cervical / Trunk Assessment: Normal   Communication Communication Communication: No difficulties   Cognition Arousal/Alertness: Awake/alert Behavior During Therapy: Anxious Overall Cognitive Status: Within Functional Limits for tasks assessed                                     General Comments       Exercises     Shoulder Instructions      Home Living Family/patient expects to be discharged to:: Private residence Living Arrangements: Alone   Type of Home: Apartment Home Access: Stairs to enter Technical brewer of Steps: 2 Entrance Stairs-Rails: None Home Layout: One level     Bathroom Shower/Tub: Teacher, early years/pre: Standard     Home Equipment: None          Prior Functioning/Environment Level of Independence: Independent        Comments: Was working Investment banker, corporate at [employer], hoping to return once health improves. Only drives from home<>[employer]        OT  Problem List:        OT Treatment/Interventions:      OT Goals(Current goals can be found in the care plan section) Acute Rehab OT Goals Patient Stated Goal: to get stronger  OT Frequency:     Barriers to D/C:            Co-evaluation              AM-PAC PT "6 Clicks" Daily Activity     Outcome Measure Help from another person eating meals?: None Help from another person taking care of personal grooming?: None Help from another person toileting, which includes using toliet, bedpan, or urinal?: None Help from another person bathing (including washing, rinsing, drying)?: None Help from another person to put on and taking off regular upper body clothing?: None Help from another person to put on and taking off regular lower body  clothing?: None 6 Click Score: 24   End of Session Equipment Utilized During Treatment: Gait belt  Activity Tolerance: Patient tolerated treatment well Patient left: in chair;with call bell/phone within reach  OT Visit Diagnosis: Other abnormalities of gait and mobility (R26.89)                Time: 2637-8588 OT Time Calculation (min): 22 min Charges:  OT General Charges $OT Visit: 1 Visit OT Evaluation $OT Eval Low Complexity: 1 Low  12/26/2017 Lori Butler, OTR/L Pager: [place], [place] 12/26/2017, 1:59 PM

## 2017-12-26 NOTE — Progress Notes (Addendum)
Lori Butler  AYT:[id number] DOB: 1950/07/19 DOA: 12/24/2017 PCP: [name], MD    Reason for Consult/Chief Complaint:  Acute on chronic respiratory failure  Consulting MD:  None, admitted from our office.  HPI/Brief Narrative   67 year old former smoker with history of moderate COPD (FEV1 51% predicted), recently discharged following hospital stay from 8/4 to 8/6 for acute exacerbation of COPD.  Readmitted on 8/19 with working diagnosis of recurrent acute exacerbation of chronic obstructive pulmonary disease requiring BiPAP 8/20: BiPAP removed.  Placed on nasal cannula.  No wheezing on morning exam. Discussed with PCCM-NP.  Assessment & Plan:  Acute on chronic respiratory failure in the setting of acute exacerbation chronic obstructive pulmonary disease Moderate to severe COPD Episodes seem to happen primarily at night.  Rather acute in onset.  Think that there is a strong reactive airway component to her acute decompensations.  Not wheezing at all this morning. Plan Maintain on duonebs q6 hours Budesonide twice daily Solumedrol stopped, prednisone PO added Singulair PPI BID We discussed an action plan in regards to use of rescue bronchodilators and when to call pulmonary office  Anxiety Plan Continue PRN Xanax and every 12 hour Wellbutrin  Borderline anemia Plan Follow-up as outpatient  Physical deconditioning: Plan: PT evaluation OT evaluation Patient does not feel comfortable going home with her current level of physical weakness, will consult PT for ?of inpatient or out of facility physical rehab  Best practice/Goals of care/disposition.   DVT prophylaxis: Subcutaneous heparin GI prophylaxis: None Diet: Regular Mobility: Mobility with assistance Code Status: Full code Family Communication: Pending  Disposition/ summary of today's plan:    PT/OT evaluation, need rehab facility, transfer care to [facility]  Consultants:  None  Procedures:   Significant  diagnostic tests:   Micro data: MRSA PCR screen on 8/19: Negative  Antimicrobials:    Subjective  Anxious this morning, frustrated as she is taking her medications as prescribed, feels like she is doing all the things that she was instructed to do yet still ends up in the hospital  Objective    Blood pressure 128/76, pulse 89, temperature 98.4 F (36.9 C), temperature source Oral, resp. rate 19, height 5\' 5"  (1.651 m), weight 68.2 kg, SpO2 94 %.        Intake/Output Summary (Last 24 hours) at 12/26/2017 1148 Last data filed at 12/26/2017 1000 Gross per 24 hour  Intake 2585.16 ml  Output 700 ml  Net 1885.16 ml   Filed Weights   12/24/17 2248 12/25/17 2239  Weight: 68.2 kg 68.2 kg    Examination: General: Pleasant 67 year old white female currently resting in bed she is tearful but in no acute distress HENT: Normocephalic atraumatic no jugular venous distention no upper airway wheezing Lungs: Clear to auscultation no accessory use Cardiovascular: Regular rate and rhythm without murmur rub or gallop Abdomen: Soft nontender no organomegaly positive bowel sounds Extremities: Brisk capillary refill no edema warm dry strong pulses Neuro: Awake oriented no focal deficits GU: Voiding  Labs   CBC: Recent Labs  Lab 12/24/17 0829 12/25/17 0210  WBC 15.0* 10.9*  NEUTROABS 10.6*  --   HGB 12.5 11.4*  HCT 40.3 35.9*  MCV 95.5 94.2  PLT 321 355   Basic Metabolic Panel: Recent Labs  Lab 12/24/17 0829 12/25/17 0210  NA 141 141  K 4.5 5.0  CL 108 108  CO2 25 24  GLUCOSE 153* 117*  BUN 11 14  CREATININE 0.63 0.55  CALCIUM 8.5* 8.4*  MG  --  2.4  PHOS  --  2.3*   GFR: Estimated Creatinine Clearance: 60.6 mL/min (by C-G formula based on SCr of 0.55 mg/dL). Recent Labs  Lab 12/24/17 0829 12/25/17 0210  WBC 15.0* 10.9*   Liver Function Tests: Recent Labs  Lab 12/24/17 0829  AST 23  ALT 27  ALKPHOS 128*  BILITOT 0.7  PROT 6.8  ALBUMIN 3.8   No results for  input(s): LIPASE, AMYLASE in the last 168 hours. No results for input(s): AMMONIA in the last 168 hours. ABG    Component Value Date/Time   PHART 7.427 12/25/2017 0419   PCO2ART 33.5 12/25/2017 0419   PO2ART 94.6 12/25/2017 0419   HCO3 21.7 12/25/2017 0419   TCO2 25 12/24/2017 1148   ACIDBASEDEF 2.0 12/25/2017 0419   O2SAT 97.8 12/25/2017 0419    Coagulation Profile: No results for input(s): INR, PROTIME in the last 168 hours. Cardiac Enzymes: No results for input(s): CKTOTAL, CKMB, CKMBINDEX, TROPONINI in the last 168 hours. HbA1C: Hgb A1c MFr Bld  Date/Time Value Ref Range Status  12/10/2017 02:40 AM 5.0 4.8 - 5.6 % Final    Comment:    (NOTE) Pre diabetes:          5.7%-6.4% Diabetes:              >6.4% Glycemic control for   <7.0% adults with diabetes    CBG: No results for input(s): GLUCAP in the last 168 hours.  I reviewed CXR myself, hyperinflation noted  Rush Farmer, M.D. [facility] Pulmonary/Critical Care Medicine. Pager: [phone]. After hours pager: [phone]  Consulted with case management and social work, PT was called and patient is too functional for inpatient rehab.  After discussion amongst patient, PT and case management, we will be able to get home health to see patient at home and assist.  Patient is comfortable with that and we will discharge.  Rush Farmer, M.D. [facility] Pulmonary/Critical Care Medicine. Pager: [phone]. After hours pager: [phone]

## 2017-12-26 NOTE — Progress Notes (Signed)
MD spoke with CSW regarding possible SNF placement for patient at patient request for concerns about weakness.  CSW spoke with pt and she confirms she is interested in SNF stay due to current weakness/lethargy.  PT worked with pt and thinks she is more home health appropriate- pt expressed understanding and is agreeable to return home at [place] to follow up for home health recommendations  CSW signing off  Jorge Ny, [place] Social Worker [phone]

## 2017-12-26 NOTE — Progress Notes (Signed)
Skin assessed for breakdown, no issues noted with heels, ears, buttocks.  Sacral foam removed and no redness noted.

## 2017-12-26 NOTE — Care Management Note (Signed)
Case Management Note  Patient Details  Name: Indra Wolters MRN: [id number] Date of Birth: [birth year]  Subjective/Objective:   Patient for dc today, she did not qualify for SNF NCM offered [place] choice from [place] list, she chose [facility], referral made to [place] by vm, awaiting to hear back if she can take patient.                   Action/Plan: DC home with [facility] services when ready.  Expected Discharge Date:                  Expected Discharge Plan:  [place]  In-House Referral:     Discharge planning Services  CM Consult  Post Acute Care Choice:  Home Health Choice offered to:  Patient  DME Arranged:    DME Agency:     HH Arranged:  RN, Disease Management, PT, Nurse's Aide, Social Work [employer] Agency:  [place]  Status of Service:  In process, will continue to follow  If discussed at Long Length of Stay Meetings, dates discussed:    Additional Comments:  Zenon Mayo, RN 12/26/2017, 1:21 PM

## 2017-12-27 ENCOUNTER — Other Ambulatory Visit: Payer: Self-pay | Admitting: Pharmacist

## 2017-12-27 ENCOUNTER — Other Ambulatory Visit: Payer: Self-pay

## 2017-12-27 NOTE — Patient Outreach (Signed)
[place] [facility]) Care Management  12/27/2017  Lori Butler 1950-09-16 [id number]  Successful call to the patient on today's date, HIPAA identifiers confirmed. BSW introduced self to the patient and the reason for today's call. The patient indicates she has had difficulty with finances since May when she stopped working due to health conditions. The patient is currently staying with her son until Saturday when she plans to return home. 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 The patient makes just over the Medicaid income eligibility threshold. The patient has incurred multiple medical bills within the last 6 months.   BSW will assist the patient in applying for Medicaid as well as the completion of a [place] financial hardship application. BSW to mail these applications to the patient. BSW to follow up in the next two weeks to confirm receipt of mailings and assist with completion as needed.  Daneen Schick, BSW, CDP [facility] [phone]

## 2017-12-27 NOTE — Patient Outreach (Signed)
[facility]) Care Management  12/27/2017  Lori Butler 1950-07-14 [id number]  67 year old female referred to [facility] [facility] by [facility] inpatient liaison for transition of care services.  [place] services requested for medication reconciliation and medication assistance.    PMHx includes, but not limited to, COPD, former smoker, osteoporosis, and vitamin D deficiency. Noted patient recently hospitalized for AECOPD 8/4-8/6, then readmitted 8/19 with recurrent acute exacerbation of COPD requiring BiPAP.    Insurance: [employer]  Subjective: Successful outreach call to Ms. Master[facility] today.  HIPAA identifiers verified. Patient reports she is feeling better and is staying with her son post-discharge.  She reports she has picked up her new medications prescribed at discharge and has started taking them.  Patient agreeable to review medications.   Objective: Medications Reviewed Today    Reviewed by Rudean Haskell, RPH (Pharmacist) on 12/27/17 at [phone]  Med List Status: <None>  Medication Order Taking? Sig Documenting Provider Last Dose Status Informant  acetaminophen (TYLENOL) 500 MG tablet [id number] Yes Take 1,000 mg by mouth every 6 (six) hours as needed for mild pain or headache. [provider] Taking Active Self  albuterol (PROVENTIL HFA;VENTOLIN HFA) 108 (90 Base) MCG/ACT inhaler [id number] Yes Inhale 2 puffs into the lungs every 6 (six) hours as needed for wheezing or shortness of breath. Raiford Noble [place], DO Taking Active Self           Med Note Dorina Hoyer, Nigel Berthold Dec 09, 2017 11:30 AM)    albuterol (PROVENTIL) (2.5 MG/3ML) 0.083% nebulizer solution [id number] Yes Take 3 mLs (2.5 mg total) by nebulization every 4 (four) hours as needed for wheezing or shortness of breath. Raiford Noble [place], DO Taking Active Self           Med Note Craig Staggers Dec 09, 2017 11:30 AM)    alendronate (FOSAMAX) 70 MG tablet [id number] Yes Take 70 mg by mouth  every Wednesday.  [provider] Taking Active Self           Med Note [name] Dec 24, 2017  2:52 PM)    budesonide-formoterol [facility]) 160-4.5 MCG/ACT inhaler [id number] Yes Inhale 2 puffs into the lungs 2 (two) times daily. Arnell Asal, NP Taking Active   buPROPion [facility]) 100 MG 12 hr tablet [id number] Yes Take 1 tablet (100 mg total) by mouth daily. Bonnielee Haff, MD Taking Active Self           Med Note Iva Lento, Rance Muir Dec 27, 2017  9:21 AM) Patient unsure if this is 119m   guaiFENesin (MUCINEX) 600 MG 12 hr tablet [id number] Take 2 tablets (1,200 mg total) by mouth 2 (two) times daily.  Patient taking differently:  Take 600 mg by mouth 2 (two) times daily.    SRaiford NobleLWaresboro DO Taking Active Self           Med Note (Craig StaggersAug 4, 2019 11:31 AM)    montelukast (SINGULAIR) 10 MG tablet [id number] Take 1 tablet (10 mg total) by mouth at bedtime. SArnell Asal NP Taking Active   pantoprazole (PROTONIX) 40 MG tablet [id number] Take 1 tablet (40 mg total) by mouth 2 (two) times daily. SArnell Asal NP Taking Active   predniSONE (DELTASONE) 10 MG tablet [id number] 10 mg tabs, Take 4 tabs daily x 4 days, then 3 tabs daily x  4 days, then 2 tabs daily x 4 days, then 1 tab daily x4 days then stop. Arnell Asal, NP Taking Active   tiotropium (SPIRIVA HANDIHALER) 18 MCG inhalation capsule [id number] Yes Place 1 capsule (18 mcg total) into inhaler and inhale daily. Raiford Noble [place], DO Taking Active Self           Med Note Craig Staggers Dec 09, 2017 11:31 AM)    Vitamin D, Ergocalciferol, (DRISDOL) 50000 units CAPS capsule [id number] Yes Take 50,000 Units by mouth every Wednesday.  [provider] Taking Active Self           Med Note [name] Dec 24, 2017  2:53 PM)           ASSESSMENT: Date Discharged from Hospital: 12/26/2017 Date Medication Reconciliation  Performed: 12/27/2017  Medications Discontinued at Discharge:   Advair  Folic acid  Naproxen  Vitamin B-12  New Medications at Discharge:   Symbicort  Singulair  Protonix  Prednisone   Patient was recently discharged from hospital and all medications have been reviewed  Drugs sorted by system:  Neurologic/Psychologic: bupropion  Cardiovascular: n/a  Pulmonary/Allergy: albuterol inhaler, albuterol nebulizer, budesonide-formoterol, tiotropium, montelukast, guaifenesin  Gastrointestinal:pantoprazole  Endocrine: prednisone  Renal:n/a  Topical:n/a  Pain: acetaminophen  Vitamins/Minerals: vitamin D  Infectious Diseases:n/a  Miscellaneous: alendronate  Medication assistance: Reviewed Extra Help and PAP programs with patient.    Extra Help: Patient applied for Extra Help last week and is waiting on decision.   PAPs:  Symbicort:  Switched from Advair --> Symbicort at discharge.  Symbicort PAP via [employer] requires 3% TROOP which patient reports she has not met yet.  Could consider changing to [facility] and applying via [employer] (no TROOP)  Spiriva:  No TROOP requirement, eligible based on income.   Patient reports she would like to wait on any PAP applications until after decision made with Extra Help.    I reviewed inhaler administration technique in detail with patient for both dry powder and spray formulations.  We reviewed to rinse and spit after using Symbicort.  We reviewed when to use albuterol nebulizer vs inhaler.  Patient's son has spacer that patient has access to use therefore we also reviewed how to use the spacer if patient feels it would be beneficial.  Patient denies any further medication questions.  I provided my phone number to patient if she needs to reach out to me.    PLAN: -Instructed patient to take new medications as prescribed and discontinue old medications as prescribed  -I will follow-up with patient in 2 weeks regarding Extra Help / LIS  decision  Ralene Bathe, PharmD, [place] [phone]

## 2017-12-30 DIAGNOSIS — J441 Chronic obstructive pulmonary disease with (acute) exacerbation: Secondary | ICD-10-CM | POA: Diagnosis not present

## 2017-12-30 DIAGNOSIS — Z87891 Personal history of nicotine dependence: Secondary | ICD-10-CM | POA: Diagnosis not present

## 2017-12-30 DIAGNOSIS — M1990 Unspecified osteoarthritis, unspecified site: Secondary | ICD-10-CM | POA: Diagnosis not present

## 2017-12-30 DIAGNOSIS — F419 Anxiety disorder, unspecified: Secondary | ICD-10-CM | POA: Diagnosis not present

## 2017-12-30 DIAGNOSIS — J9602 Acute respiratory failure with hypercapnia: Secondary | ICD-10-CM | POA: Diagnosis not present

## 2017-12-31 DIAGNOSIS — M1990 Unspecified osteoarthritis, unspecified site: Secondary | ICD-10-CM | POA: Diagnosis not present

## 2017-12-31 DIAGNOSIS — Z87891 Personal history of nicotine dependence: Secondary | ICD-10-CM | POA: Diagnosis not present

## 2017-12-31 DIAGNOSIS — J9602 Acute respiratory failure with hypercapnia: Secondary | ICD-10-CM | POA: Diagnosis not present

## 2017-12-31 DIAGNOSIS — J441 Chronic obstructive pulmonary disease with (acute) exacerbation: Secondary | ICD-10-CM | POA: Diagnosis not present

## 2017-12-31 DIAGNOSIS — F419 Anxiety disorder, unspecified: Secondary | ICD-10-CM | POA: Diagnosis not present

## 2018-01-03 ENCOUNTER — Encounter: Payer: Self-pay | Admitting: Nurse Practitioner

## 2018-01-03 ENCOUNTER — Ambulatory Visit: Payer: Medicare HMO | Admitting: Nurse Practitioner

## 2018-01-03 DIAGNOSIS — M1990 Unspecified osteoarthritis, unspecified site: Secondary | ICD-10-CM | POA: Diagnosis not present

## 2018-01-03 DIAGNOSIS — J441 Chronic obstructive pulmonary disease with (acute) exacerbation: Secondary | ICD-10-CM | POA: Diagnosis not present

## 2018-01-03 DIAGNOSIS — J449 Chronic obstructive pulmonary disease, unspecified: Secondary | ICD-10-CM

## 2018-01-03 DIAGNOSIS — Z87891 Personal history of nicotine dependence: Secondary | ICD-10-CM | POA: Diagnosis not present

## 2018-01-03 DIAGNOSIS — R5381 Other malaise: Secondary | ICD-10-CM

## 2018-01-03 DIAGNOSIS — J9602 Acute respiratory failure with hypercapnia: Secondary | ICD-10-CM | POA: Diagnosis not present

## 2018-01-03 DIAGNOSIS — F419 Anxiety disorder, unspecified: Secondary | ICD-10-CM | POA: Diagnosis not present

## 2018-01-03 MED ORDER — TIOTROPIUM BROMIDE MONOHYDRATE 18 MCG IN CAPS: 18.0000 ug | ORAL_CAPSULE | Freq: Every day | RESPIRATORY_TRACT | 11 refills | Status: DC

## 2018-01-03 NOTE — Assessment & Plan Note (Signed)
Continue home PT May need referral for pulmonary rehab at follow up visit after home PT is complete

## 2018-01-03 NOTE — Progress Notes (Signed)
@Patient  ID: Lori Butler, female    DOB: 1950-11-15, 67 y.o.   MRN: [id number]  Chief Complaint  Patient presents with  . Hospitalization Follow-up    COPD     Referring provider: [name], MD  HPI  67 year old female former smoker (quit May 2019) with moderate COPD followed by Dr. Elsworth Soho.   Tests:  CT angio 09/2017 >>Emphysema andInspissated mucus in the LEFT LOWER lobe bronchi,mild fibrotic changes upper lobes PFTs 10/2017 -moderate airway obstruction, ratio 59, FEV1 51%, FVC 65%, no significant zygomatic response, DLCO 57% CT angio 12/2017 >> Negative for acute pulmonary embolus, pneumonia or other acute cardiopulmonary process. Chest xray 12/2017>> Cardiomegaly, no pulmonary venous congestion, no focal infiltrate  OV 01/03/18 Hospital follow up  Patient presents for a hospital follow up. She was admitted to the hospital from 8/4-8/6 for acute COPD exacerbation. She was readmitted on 8/19 for recurrent acute exacerbation of COPD. She was treated with IV steroids and bronchodilators. Her Advair was discontinued. Symbicort and Spiriva were ordered and prednisone taper at discharge. She was also started on Singulair at discharge. She was ordered home PT at discharge.  She reports that she is doing much better. She has been compliant with medications and home PT. She is still taking her prednisone taper and has about 3 days left. 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 She denies any fever or wheezing. She has a rescue inhaler and nebs ordered PRN. Has used them occasionally. Needs Spiriva refilled today. States that she is still not smoking. She quit in May 2019.     Allergies  Allergen Reactions  . Demerol [Meperidine] Other (See Comments)    "horrible reaction"  . Oxycodone-Acetaminophen Other (See Comments)    Upset stomach      There is no immunization history on file for this patient.  Past Medical History:  Diagnosis Date  . Acute respiratory  failure with hypoxia ([place])   . Aortic atherosclerosis ([employer])   . Arthritis    "a little in my hands probably" (10/31/2017)  . COPD (chronic obstructive pulmonary disease) ([place])   . COPD exacerbation ([place])   . History of kidney stones     Tobacco History: Social History   Tobacco Use  Smoking Status Former Smoker  . Packs/day: 1.50  . Years: 51.00  . Pack years: 76.50  . Types: Cigarettes  . Last attempt to quit: 09/05/2017  . Years since quitting: 0.3  Smokeless Tobacco Never Used   Counseling given: Yes   Outpatient Encounter Medications as of 01/03/2018  Medication Sig  . acetaminophen (TYLENOL) 500 MG tablet Take 1,000 mg by mouth every 6 (six) hours as needed for mild pain or headache.  . albuterol (PROVENTIL HFA;VENTOLIN HFA) 108 (90 Base) MCG/ACT inhaler Inhale 2 puffs into the lungs every 6 (six) hours as needed for wheezing or shortness of breath.  Marland Kitchen albuterol (PROVENTIL) (2.5 MG/3ML) 0.083% nebulizer solution Take 3 mLs (2.5 mg total) by nebulization every 4 (four) hours as needed for wheezing or shortness of breath.  Marland Kitchen alendronate (FOSAMAX) 70 MG tablet Take 70 mg by mouth every Wednesday.   . budesonide-formoterol (SYMBICORT) 160-4.5 MCG/ACT inhaler Inhale 2 puffs into the lungs 2 (two) times daily.  Marland Kitchen buPROPion (WELLBUTRIN SR) 100 MG 12 hr tablet Take 1 tablet (100 mg total) by mouth daily.  Marland Kitchen guaiFENesin (MUCINEX) 600 MG 12 hr tablet Take 2 tablets (1,200 mg total) by mouth 2 (two) times daily. (Patient  taking differently: Take 600 mg by mouth 2 (two) times daily. )  . montelukast (SINGULAIR) 10 MG tablet Take 1 tablet (10 mg total) by mouth at bedtime.  . pantoprazole (PROTONIX) 40 MG tablet Take 1 tablet (40 mg total) by mouth 2 (two) times daily.  . predniSONE (DELTASONE) 10 MG tablet 10 mg tabs, Take 4 tabs daily x 4 days, then 3 tabs daily x 4 days, then 2 tabs daily x 4 days, then 1 tab daily x4 days then stop.  Marland Kitchen tiotropium (SPIRIVA HANDIHALER) 18 MCG inhalation  capsule Place 1 capsule (18 mcg total) into inhaler and inhale daily.  . [DISCONTINUED] tiotropium (SPIRIVA HANDIHALER) 18 MCG inhalation capsule Place 1 capsule (18 mcg total) into inhaler and inhale daily.  . Vitamin D, Ergocalciferol, (DRISDOL) 50000 units CAPS capsule Take 50,000 Units by mouth every Wednesday.    No facility-administered encounter medications on file as of 01/03/2018.      Review of Systems  Review of Systems  Constitutional: Negative.   HENT: Negative.   Respiratory: Positive for shortness of breath. Negative for cough.   Cardiovascular: Negative.   Gastrointestinal: Negative.   Allergic/Immunologic: Negative.   Neurological: Negative.   Psychiatric/Behavioral: Negative.        Physical Exam  BP 122/80 (BP Location: Left Arm, Patient Position: Sitting, Cuff Size: Normal)   Pulse (!) 104   Ht 5\' 5"  (1.651 m)   Wt 150 lb (68 kg)   SpO2 99%   BMI 24.96 kg/m   Wt Readings from Last 5 Encounters:  01/03/18 150 lb (68 kg)  12/25/17 150 lb 5.7 oz (68.2 kg)  12/09/17 150 lb (68 kg)  11/07/17 155 lb (70.3 kg)  11/05/17 155 lb (70.3 kg)     Physical Exam  Constitutional: She is oriented to person, place, and time. She appears well-developed and well-nourished. No distress.  Cardiovascular: Normal rate and regular rhythm.  Pulmonary/Chest: Effort normal and breath sounds normal.  Neurological: She is alert and oriented to person, place, and time.  Psychiatric: She has a normal mood and affect.  Nursing note and vitals reviewed.    Imaging: Dg Chest 2 View  Result Date: 12/09/2017 CLINICAL DATA:  Shortness of breath. EXAM: CHEST - 2 VIEW COMPARISON:  Radiograph 10/30/2017 FINDINGS: Chronic hyperinflation and bronchial thickening with biapical pleuroparenchymal scarring. The cardiomediastinal contours are normal. Pulmonary vasculature is normal. No consolidation, pleural effusion, or pneumothorax. No acute osseous abnormalities are seen. IMPRESSION: 1.  Chronic hyperinflation and bronchial thickening, imaging findings consistent with COPD. 2. No acute chest findings. Electronically Signed   By: Jeb Levering M.D.   On: 12/09/2017 06:32   Ct Angio Chest Pe W/cm &/or Wo Cm  Result Date: 12/09/2017 CLINICAL DATA:  67 year old female with acute onset shortness of breath. EXAM: CT ANGIOGRAPHY CHEST WITH CONTRAST TECHNIQUE: Multidetector CT imaging of the chest was performed using the standard protocol during bolus administration of intravenous contrast. Multiplanar CT image reconstructions and MIPs were obtained to evaluate the vascular anatomy. CONTRAST:  185mL ISOVUE-370 IOPAMIDOL (ISOVUE-370) INJECTION 76% COMPARISON:  Chest x-ray obtained earlier today; most recent prior chest CT 09/15/2017 FINDINGS: Cardiovascular: Adequate opacification of the pulmonary arteries to the proximal segmental level. No evidence of central filling defect to suggest acute pulmonary embolus. The main pulmonary artery is normal in size. There is mild fusiform dilation of the tubular portion of the ascending thoracic aorta, however it remains normal in size. The aortic root is also normal in size. Trace atherosclerotic calcifications.  No evidence of dissection. The heart is normal in size. No pericardial effusion. Mediastinum/Nodes: Unremarkable CT appearance of the thyroid gland. No suspicious mediastinal or hilar adenopathy. No soft tissue mediastinal mass. The thoracic esophagus is unremarkable. Lungs/Pleura: Biapical pleuroparenchymal scarring. Moderately severe centrilobular pulmonary emphysema. Moderate respiratory motion artifact. No suspicious nodule or mass. Upper Abdomen: No acute abnormality. Musculoskeletal: No chest wall abnormality. No acute or significant osseous findings. Review of the MIP images confirms the above findings. IMPRESSION: 1. Negative for acute pulmonary embolus, pneumonia or other acute cardiopulmonary process. Aortic Atherosclerosis (ICD10-I70.0) and  Emphysema (ICD10-J43.9). Electronically Signed   By: Jacqulynn Cadet M.D.   On: 12/09/2017 10:13   Dg Chest Port 1 View  Result Date: 12/25/2017 CLINICAL DATA:  Cough.  Shortness of breath. EXAM: PORTABLE CHEST 1 VIEW COMPARISON:  12/24/2017. FINDINGS: Mediastinum hilar structures normal. Cardiomegaly with normal pulmonary vascularity. No focal infiltrate. No pleural effusion or pneumothorax. IMPRESSION: 1.  Cardiomegaly.  No pulmonary venous congestion. 2.  No focal infiltrate Electronically Signed   By: Marcello Moores  Register   On: 12/25/2017 07:17   Dg Chest Port 1 View  Result Date: 12/24/2017 CLINICAL DATA:  Shortness of breath, history of COPD, former smoker. EXAM: PORTABLE CHEST 1 VIEW COMPARISON:  Portable chest x-ray of December 11, 2017 FINDINGS: The lungs are hyperinflated. There is no infiltrate, pneumothorax, or pleural effusion. There is stable biapical pleural thickening. The heart and pulmonary vascularity are normal. There is calcification in the wall of the aortic arch. The trachea is midline. The bony thorax exhibits no acute abnormality. IMPRESSION: COPD. No pneumonia, CHF, nor other acute cardiopulmonary abnormality. Thoracic aortic atherosclerosis. Electronically Signed   By: David  Martinique M.D.   On: 12/24/2017 08:47   Dg Chest Port 1 View  Result Date: 12/11/2017 CLINICAL DATA:  Shortness of breath. Former smoker. History of COPD EXAM: PORTABLE CHEST 1 VIEW COMPARISON:  Chest x-ray and chest CT scan of December 09, 2017 FINDINGS: The lungs remain hyperinflated. The interstitial markings are mildly prominent. There is no alveolar infiltrate, pleural effusion, or pneumothorax. The heart and pulmonary vascularity are normal. There is calcification in the wall of the aortic arch. The bony thorax exhibits no acute abnormality. IMPRESSION: COPD. No pneumonia, CHF, nor other acute cardiopulmonary abnormality. Thoracic aortic atherosclerosis. Electronically Signed   By: David  Martinique M.D.   On:  12/11/2017 07:39     Assessment & Plan:   COPD (chronic obstructive pulmonary disease) (HCC) Recent flare requiring hospitalization - much improved Patient Instructions  Continue Symbicort and Spiriva Continue Singulair Complete prednisone taper Please use rescue inhalers as needed Congratulations on not smoking Please call if symptoms worsen Will consider pulmonary rehab at next visit after she is finished with home PT Follow up with Dr. Elsworth Soho in 1 month  Please call the office if symptoms worsen     Physical deconditioning Continue home PT May need referral for pulmonary rehab at follow up visit after home PT is complete     Fenton Foy, NP 01/03/2018

## 2018-01-03 NOTE — Assessment & Plan Note (Addendum)
Recent flare requiring hospitalization - much improved Patient Instructions  Continue Symbicort and Spiriva Continue Singulair Complete prednisone taper Please use rescue inhalers as needed Congratulations on not smoking Please call if symptoms worsen Will consider pulmonary rehab at next visit after she is finished with home PT Follow up with Dr. Elsworth Soho in 1 month  Please call the office if symptoms worsen

## 2018-01-03 NOTE — Patient Instructions (Addendum)
Continue Symbicort and Spiriva Continue Singulair Complete prednisone taper Please use rescue inhalers as needed Congratulation on not smoking Please call if symptoms worsen Will consider pulmonary rehab at next visit after she is finished with home PT Follow up with Dr. Elsworth Soho in 1 month  Please call the office if symptoms worsen

## 2018-01-04 ENCOUNTER — Encounter: Payer: Self-pay | Admitting: *Deleted

## 2018-01-04 DIAGNOSIS — M1990 Unspecified osteoarthritis, unspecified site: Secondary | ICD-10-CM | POA: Diagnosis not present

## 2018-01-04 DIAGNOSIS — J9602 Acute respiratory failure with hypercapnia: Secondary | ICD-10-CM | POA: Diagnosis not present

## 2018-01-04 DIAGNOSIS — Z87891 Personal history of nicotine dependence: Secondary | ICD-10-CM | POA: Diagnosis not present

## 2018-01-04 DIAGNOSIS — F419 Anxiety disorder, unspecified: Secondary | ICD-10-CM | POA: Diagnosis not present

## 2018-01-04 DIAGNOSIS — J441 Chronic obstructive pulmonary disease with (acute) exacerbation: Secondary | ICD-10-CM | POA: Diagnosis not present

## 2018-01-08 ENCOUNTER — Telehealth: Payer: Self-pay | Admitting: Pulmonary Disease

## 2018-01-08 NOTE — Telephone Encounter (Signed)
Called and spoke to [place] with [facility].  Clair Gulling is requesting order to extend PT twice weekly for two weeks.  Clair Gulling also stated he has attempted to contact pt's [facility] without success.  I have made Clair Gulling aware that RA is out of the office until 01/21/18. Clair Gulling is requesting that this matter be addressed prior.   Tonya please advise, as RA is currently out of the office. Thanks

## 2018-01-09 DIAGNOSIS — M1990 Unspecified osteoarthritis, unspecified site: Secondary | ICD-10-CM | POA: Diagnosis not present

## 2018-01-09 DIAGNOSIS — J9602 Acute respiratory failure with hypercapnia: Secondary | ICD-10-CM | POA: Diagnosis not present

## 2018-01-09 DIAGNOSIS — J441 Chronic obstructive pulmonary disease with (acute) exacerbation: Secondary | ICD-10-CM | POA: Diagnosis not present

## 2018-01-09 DIAGNOSIS — F419 Anxiety disorder, unspecified: Secondary | ICD-10-CM | POA: Diagnosis not present

## 2018-01-09 DIAGNOSIS — Z87891 Personal history of nicotine dependence: Secondary | ICD-10-CM | POA: Diagnosis not present

## 2018-01-09 NOTE — Telephone Encounter (Signed)
Clair Gulling called back - he states that since leaving message he has heard back from patient's PCP and they were able to provide him the verbal orders he needed. He no longer needs anything else from [place]. He can be reached at [phone] if needed -pr

## 2018-01-09 NOTE — Telephone Encounter (Signed)
lmtcb x1 for Clair Gulling with [facility]

## 2018-01-09 NOTE — Telephone Encounter (Signed)
Will close encounter, as nothing further is needed.  

## 2018-01-09 NOTE — Telephone Encounter (Signed)
That would be done through the PCP, but we were planning on doing a referral to pulmonary rehab after she finished PT.

## 2018-01-10 ENCOUNTER — Other Ambulatory Visit: Payer: Self-pay | Admitting: Pharmacist

## 2018-01-10 DIAGNOSIS — Z87891 Personal history of nicotine dependence: Secondary | ICD-10-CM | POA: Diagnosis not present

## 2018-01-10 DIAGNOSIS — J9602 Acute respiratory failure with hypercapnia: Secondary | ICD-10-CM | POA: Diagnosis not present

## 2018-01-10 DIAGNOSIS — J441 Chronic obstructive pulmonary disease with (acute) exacerbation: Secondary | ICD-10-CM | POA: Diagnosis not present

## 2018-01-10 DIAGNOSIS — M1990 Unspecified osteoarthritis, unspecified site: Secondary | ICD-10-CM | POA: Diagnosis not present

## 2018-01-10 DIAGNOSIS — F419 Anxiety disorder, unspecified: Secondary | ICD-10-CM | POA: Diagnosis not present

## 2018-01-10 NOTE — Patient Outreach (Signed)
[place] [facility]) Care Management  01/10/2018  Anju Sereno [birth year] [id number]  Successful call to [name] today to follow-up on medication assistance. HIPAA identifiers verified. Patient reports she has not received any letter yet from social security office regarding status of Extra Help application.  She reports she is due for a refill on Spiriva by Saturday and has been delaying picking it up due to cost.    Per review of [email or web address], patient has Full Extra Help subsidy.   3-way call placed to [place].  Spiriva prescription billed for 90 day supply for $8.50 copay, verifying Full Extra Help.  Pharmacy will fill prescription for patient today.  No other immediate medication refills needed at this time.   I counseled patient on benefit of 90 day supply of medications as this will have same co-pay as 30 day supply through Extra Help.  I reviewed [employer] OTC program to patient and encouraged her to use the quarterly amount provided if applicable.  I gave patient the [facility] phone number to call to verify benefit, request catalogues, and place orders.  Patient voiced understanding.  She expressed appreciation for [facility] assistance.    Plan: I will close [place] case at this time but am happy to assist in the future as needed.   Ralene Bathe, PharmD, [place] [phone]

## 2018-01-11 ENCOUNTER — Other Ambulatory Visit: Payer: Self-pay | Admitting: *Deleted

## 2018-01-11 ENCOUNTER — Other Ambulatory Visit: Payer: Self-pay

## 2018-01-11 DIAGNOSIS — Z87891 Personal history of nicotine dependence: Secondary | ICD-10-CM | POA: Diagnosis not present

## 2018-01-11 DIAGNOSIS — M1990 Unspecified osteoarthritis, unspecified site: Secondary | ICD-10-CM | POA: Diagnosis not present

## 2018-01-11 DIAGNOSIS — J441 Chronic obstructive pulmonary disease with (acute) exacerbation: Secondary | ICD-10-CM | POA: Diagnosis not present

## 2018-01-11 DIAGNOSIS — F419 Anxiety disorder, unspecified: Secondary | ICD-10-CM | POA: Diagnosis not present

## 2018-01-11 DIAGNOSIS — J9602 Acute respiratory failure with hypercapnia: Secondary | ICD-10-CM | POA: Diagnosis not present

## 2018-01-11 NOTE — Patient Outreach (Signed)
[place] [facility]) Care Management  01/11/2018  Lori Butler 01-09-1951 [id number]   [place] Introduction Call  Referral Date:  12/25/2017 Referral Source:  Hospital Liaison Reason for Referral:  Disease Management Education Insurance:  [facility] Medicare   Outreach Attempt:  Successful telephone outreach to patient for introductory call.  HIPAA verified with patient.  RN Health Coach introduced self and role.  Patient verbally agrees to monthly telephone outreaches.  Plan:  RN Health Coach will outreach to patient for initial telephone assessment within the next 15 business days.   [place] Coach [phone] [email or web address]

## 2018-01-11 NOTE — Patient Outreach (Signed)
[place] [facility]) Care Management  01/11/2018  Lori Butler 04-23-1951 [id number]  Successful outreach to the patient on today's date, HIPAA identifiers confirmed. The patient reports she has been working to apply for [employer] and has been  Contacted by a Medicaid case worker requesting more information. The patient declines receipt of [place] financial hardship application. BSW to resend this resource and contact the patient in the next three weeks to confirm receipt.  Daneen Schick, BSW, CDP [facility] [phone]

## 2018-01-15 DIAGNOSIS — Z87891 Personal history of nicotine dependence: Secondary | ICD-10-CM | POA: Diagnosis not present

## 2018-01-15 DIAGNOSIS — F419 Anxiety disorder, unspecified: Secondary | ICD-10-CM | POA: Diagnosis not present

## 2018-01-15 DIAGNOSIS — J9602 Acute respiratory failure with hypercapnia: Secondary | ICD-10-CM | POA: Diagnosis not present

## 2018-01-15 DIAGNOSIS — J441 Chronic obstructive pulmonary disease with (acute) exacerbation: Secondary | ICD-10-CM | POA: Diagnosis not present

## 2018-01-15 DIAGNOSIS — M1990 Unspecified osteoarthritis, unspecified site: Secondary | ICD-10-CM | POA: Diagnosis not present

## 2018-01-17 DIAGNOSIS — J449 Chronic obstructive pulmonary disease, unspecified: Secondary | ICD-10-CM | POA: Diagnosis not present

## 2018-01-17 DIAGNOSIS — J441 Chronic obstructive pulmonary disease with (acute) exacerbation: Secondary | ICD-10-CM | POA: Diagnosis not present

## 2018-01-17 DIAGNOSIS — Z87891 Personal history of nicotine dependence: Secondary | ICD-10-CM | POA: Diagnosis not present

## 2018-01-17 DIAGNOSIS — F419 Anxiety disorder, unspecified: Secondary | ICD-10-CM | POA: Diagnosis not present

## 2018-01-17 DIAGNOSIS — M1990 Unspecified osteoarthritis, unspecified site: Secondary | ICD-10-CM | POA: Diagnosis not present

## 2018-01-17 DIAGNOSIS — J9602 Acute respiratory failure with hypercapnia: Secondary | ICD-10-CM | POA: Diagnosis not present

## 2018-01-19 DIAGNOSIS — R350 Frequency of micturition: Secondary | ICD-10-CM | POA: Diagnosis not present

## 2018-01-19 DIAGNOSIS — R300 Dysuria: Secondary | ICD-10-CM | POA: Diagnosis not present

## 2018-01-21 DIAGNOSIS — Z6825 Body mass index (BMI) 25.0-25.9, adult: Secondary | ICD-10-CM | POA: Diagnosis not present

## 2018-01-21 DIAGNOSIS — Z23 Encounter for immunization: Secondary | ICD-10-CM | POA: Diagnosis not present

## 2018-01-21 DIAGNOSIS — J441 Chronic obstructive pulmonary disease with (acute) exacerbation: Secondary | ICD-10-CM | POA: Diagnosis not present

## 2018-01-22 ENCOUNTER — Encounter: Payer: Self-pay | Admitting: *Deleted

## 2018-01-22 ENCOUNTER — Other Ambulatory Visit: Payer: Self-pay | Admitting: *Deleted

## 2018-01-22 DIAGNOSIS — J441 Chronic obstructive pulmonary disease with (acute) exacerbation: Secondary | ICD-10-CM | POA: Diagnosis not present

## 2018-01-22 DIAGNOSIS — F419 Anxiety disorder, unspecified: Secondary | ICD-10-CM | POA: Diagnosis not present

## 2018-01-22 DIAGNOSIS — Z87891 Personal history of nicotine dependence: Secondary | ICD-10-CM | POA: Diagnosis not present

## 2018-01-22 DIAGNOSIS — J9602 Acute respiratory failure with hypercapnia: Secondary | ICD-10-CM | POA: Diagnosis not present

## 2018-01-22 DIAGNOSIS — M1990 Unspecified osteoarthritis, unspecified site: Secondary | ICD-10-CM | POA: Diagnosis not present

## 2018-01-22 NOTE — Patient Outreach (Signed)
[place] [facility]) Care Management  [place]  01/22/2018   Lori Butler [birth year] [id number]   [name] Initial Assessment  Referral Date:  12/25/2017 Referral Source:  Hospital Liaison Reason for Referral:  Disease Management Education Insurance:  [facility] Medicare   Outreach Attempt:  Successful telephone outreach to patient for initial telephone assessment. HIPAA verified with patient.  Patient completed initial telephone assessment.  Social:  Patient lives at home alone.  Independent with ADLs and IADLs.  Ambulates independently and denies any falls in the past year.  Has completed home health physical therapy but continues to receive a home health nurse with [place].  Transports herself to her medical appointments.  DME in the home include:  Grab bars in the shower, nebulizer, eyeglasses, upper dentures, and portable pulse oximetry.  Conditions:   Per chart review and discussion with patient, PMH include but not limited to:  COPD, arthritis, renal stones, aortic atherosclerosis, bilateral cataract extractions, and appendectomy.  Patient has had 4 hospitalizations related to Acute Respiratory Distress in the last 4 months.  Reports being newly diagnosed with COPD in May 2019 and having quite smoking in May of this year also.  States she has remained quite smoking and plans to continue to do so.  Denies any shortness of breath currently.  Reports having to use her rescue inhaler yesterday due to a lot of activity and exercise out in the hot sun.  Patient states she continues to do her breathing exercises with the incentive spirometry and her learned exercise from home health physical therapy.  Patient reporting she is awaiting Medicaid decision to assist with her medical bills.  Medications:  Reports taking about 6 medications.  States she manages her medications herself with weekly pill box fills.  Was having difficulties with affording medications, but  now is receiving extra help for her medications.  Encounter Medications:  Outpatient Encounter Medications as of 01/22/2018  Medication Sig Note  . acetaminophen (TYLENOL) 500 MG tablet Take 1,000 mg by mouth every 6 (six) hours as needed for mild pain or headache.   . albuterol (PROVENTIL HFA;VENTOLIN HFA) 108 (90 Base) MCG/ACT inhaler Inhale 2 puffs into the lungs every 6 (six) hours as needed for wheezing or shortness of breath.   Marland Kitchen albuterol (PROVENTIL) (2.5 MG/3ML) 0.083% nebulizer solution Take 3 mLs (2.5 mg total) by nebulization every 4 (four) hours as needed for wheezing or shortness of breath.   Marland Kitchen alendronate (FOSAMAX) 70 MG tablet Take 70 mg by mouth every Wednesday.    . budesonide-formoterol (SYMBICORT) 160-4.5 MCG/ACT inhaler Inhale 2 puffs into the lungs 2 (two) times daily.   Marland Kitchen buPROPion (WELLBUTRIN SR) 100 MG 12 hr tablet Take 1 tablet (100 mg total) by mouth daily. 12/27/2017: Patient unsure if this is 150mg    . guaiFENesin (MUCINEX) 600 MG 12 hr tablet Take 2 tablets (1,200 mg total) by mouth 2 (two) times daily. (Patient taking differently: Take 600 mg by mouth 2 (two) times daily. )   . montelukast (SINGULAIR) 10 MG tablet Take 1 tablet (10 mg total) by mouth at bedtime.   . pantoprazole (PROTONIX) 40 MG tablet Take 1 tablet (40 mg total) by mouth 2 (two) times daily.   Marland Kitchen tiotropium (SPIRIVA HANDIHALER) 18 MCG inhalation capsule Place 1 capsule (18 mcg total) into inhaler and inhale daily.   . Vitamin D, Ergocalciferol, (DRISDOL) 50000 units CAPS capsule Take 50,000 Units by mouth every Wednesday.  01/22/2018: Reports completed this prescription and  is now taking 2000 units daily  . predniSONE (DELTASONE) 10 MG tablet 10 mg tabs, Take 4 tabs daily x 4 days, then 3 tabs daily x 4 days, then 2 tabs daily x 4 days, then 1 tab daily x4 days then stop. (Patient not taking: Reported on 01/22/2018) 01/22/2018: Has completed taper   No facility-administered encounter medications on file as  of 01/22/2018.     Functional Status:  In your present state of health, do you have any difficulty performing the following activities: 01/22/2018 12/24/2017  Hearing? N N  Vision? N N  Difficulty concentrating or making decisions? N N  Walking or climbing stairs? N N  Dressing or bathing? N Y  Doing errands, shopping? N Y  Comment - -  Conservation officer, nature and eating ? N -  Using the Toilet? N -  In the past six months, have you accidently leaked urine? N -  Do you have problems with loss of bowel control? N -  Managing your Medications? N -  Managing your Finances? N -  Housekeeping or managing your Housekeeping? N -  Some recent data might be hidden    Fall/Depression Screening: Fall Risk  01/22/2018  Falls in the past year? No   PHQ 2/9 Scores 01/22/2018  PHQ - 2 Score 1    TH[facility] CM Care Plan Problem One     Most Recent Value  Care Plan Problem One  Financial barriers related to self care management of COPD  Role Documenting the Problem One  [facility] [place] for Problem One  Active  [facility] Long Term Goal   Patient will have no hospitalizations in the next 90 days.  THN Long Term Goal Start Date  01/22/18  Interventions for Problem One Long Term Goal  Reviewed current care plan and goals, encouraged to keep and attend medical appointments, reviewed medications and encouraged compliance, congratulated patient on her continued dedication to perform her learned exercises/daily walks/daily use of her incentive spirometry, congratulated and encouraged patient to continue to stay quite smoking, reviewed COPD Action Plan and Zones with patient and when to call the physician versus going to the Emergency Room   [facility] CM Short Term Goal #1   Patient will be report discussing returning to work with her physicians in the next 60 days.  [facility]N CM Short Term Goal #1 Start Date  01/22/18  Interventions for Short Term Goal #1  Encouraged patient to increase activities slowly as tolerated and to  continue to try and avoid heat as this triggers her COPD/Asthma, discussed not pushing herself too soon to return to work without the guidance or support of her pulmonologist, encouraged patient to discuss work options with pulmonologist prior to returning to work, discussed possible participation in pulmonary rehab prior to returning to work     Appointments:  Patient attended primary care appointment with Dr. Sabra Heck on 01/21/2018 and has follow up appointment on February 2020.  Sees Dr. Elsworth Soho with Pulmonology on 02/05/2018.  Encouraged to keep and attend medical appointments.  Advanced Directives: Reports having a Living Will and [place] in place and does not wish to make any changes at this time.   Consent:  [facility] services reviewed and discussed with patient.  Patient verbally agrees to Disease Management outreaches.  Plan: RN Health Coach will send primary MD barriers letter. RN Health Coach will route initial telephone assessment note to primary MD. [employer] will send patient [place].  RN Health Coach will make next telephone outreach to patient in the month of December.  [place] [phone] [email or web address]

## 2018-01-31 DIAGNOSIS — J441 Chronic obstructive pulmonary disease with (acute) exacerbation: Secondary | ICD-10-CM | POA: Diagnosis not present

## 2018-01-31 DIAGNOSIS — J9602 Acute respiratory failure with hypercapnia: Secondary | ICD-10-CM | POA: Diagnosis not present

## 2018-01-31 DIAGNOSIS — M1990 Unspecified osteoarthritis, unspecified site: Secondary | ICD-10-CM | POA: Diagnosis not present

## 2018-01-31 DIAGNOSIS — F419 Anxiety disorder, unspecified: Secondary | ICD-10-CM | POA: Diagnosis not present

## 2018-01-31 DIAGNOSIS — Z87891 Personal history of nicotine dependence: Secondary | ICD-10-CM | POA: Diagnosis not present

## 2018-02-01 ENCOUNTER — Other Ambulatory Visit: Payer: Self-pay

## 2018-02-01 NOTE — Patient Outreach (Signed)
[place] [facility]) Care Management  02/01/2018  Autum Benfer [birth year] [id number]  Successful call on today's date to the patient who confirms receipt of a [place] financial hardship application. The patient discloses she has chosen not to complete this application as she has been informed she was approved for Medicaid. The patient stated "I do not want to double dip". BSW encouraged the patient to contact the finance department at [facility] to provide her Medicaid information considering Medicaid does cover some past medical bills. The patient stated understanding and confirmed she would call today.  BSW to perform a discipline closure as no other social work needs have been identified. The patient agrees with a discipline closure and is aware she will remain active with a [place].  Daneen Schick, BSW, CDP [facility] [phone]

## 2018-02-05 ENCOUNTER — Ambulatory Visit: Payer: Medicare HMO | Admitting: Pulmonary Disease

## 2018-02-05 ENCOUNTER — Encounter: Payer: Self-pay | Admitting: Pulmonary Disease

## 2018-02-05 DIAGNOSIS — J449 Chronic obstructive pulmonary disease, unspecified: Secondary | ICD-10-CM | POA: Diagnosis not present

## 2018-02-05 DIAGNOSIS — J441 Chronic obstructive pulmonary disease with (acute) exacerbation: Secondary | ICD-10-CM

## 2018-02-05 MED ORDER — PREDNISONE 10 MG PO TABS: ORAL_TABLET | ORAL | 0 refills | Status: DC

## 2018-02-05 NOTE — Assessment & Plan Note (Signed)
She seems to have clinical variability suggesting a component of asthma. We will keep a prescription for prednisone handy

## 2018-02-05 NOTE — Assessment & Plan Note (Signed)
Prednisone 10 mg tabs  Take 2 tabs daily with food x 5ds, then 1 tab daily with food x 5ds then STOP  Stay on Symbicort and Spiriva. Call early if you develop persistent chest tightness, shortness of breath or productive cough

## 2018-02-05 NOTE — Progress Notes (Signed)
   Subjective:    Patient ID: Lori Butler, female    DOB: Jul 31, 1950, 67 y.o.   MRN: [id number]  HPI  67 yo ex- smoker for FU of moderate COPD   She had 2 hospitalizations 12/2017 for acute hypercarbic respiratory failure with change in mental status.  Each time she improved quickly and did not require oxygen on discharge.  She has had a good month but last few days due to weather changes, and feeling Monday, she has developed chest tightness.  Denies wheezing or productive cough but has had to use her albuterol MDI more.  Has not needed nebulizer treatment. No chest pains on exertion, orthopnea or paroxysmal nocturnal dyspnea.  Immunizations up-to-date. When she is compliant with her Symbicort and Spiriva. She quit smoking 09/2017  Significant tests/ events reviewed  CT angio 09/2017 >>Emphysema andInspissated mucus in the LEFT LOWER lobe bronchi,mild fibrotic changes upper lobes  PFTs 10/2017 -moderate airway obstruction, ratio 59, FEV1 51%, FVC 65%, no significant BD response, DLCO 57% Review of Systems neg for any significant sore throat, dysphagia, itching, sneezing, nasal congestion or excess/ purulent secretions, fever, chills, sweats, unintended wt loss, pleuritic or exertional cp, hempoptysis, orthopnea pnd or change in chronic leg swelling. Also denies presyncope, palpitations, heartburn, abdominal pain, nausea, vomiting, diarrhea or change in bowel or urinary habits, dysuria,hematuria, rash, arthralgias, visual complaints, headache, numbness weakness or ataxia.     Objective:   Physical Exam   Gen. Pleasant, well-nourished, in no distress ENT - no thrush, no post nasal drip Neck: No JVD, no thyromegaly, no carotid bruits Lungs: no use of accessory muscles, no dullness to percussion, decreased without rales or rhonchi  Cardiovascular: Rhythm regular, heart sounds  normal, no murmurs or gallops, no peripheral edema Musculoskeletal: No deformities, no cyanosis or clubbing          Assessment & Plan:

## 2018-02-05 NOTE — Addendum Note (Signed)
Addended by: Karmen Stabs on: 02/05/2018 09:24 AM   Modules accepted: Orders

## 2018-02-05 NOTE — Patient Instructions (Signed)
Prednisone 10 mg tabs  Take 2 tabs daily with food x 5ds, then 1 tab daily with food x 5ds then STOP  Stay on Symbicort and Spiriva. Call early if you develop persistent chest tightness, shortness of breath or productive cough

## 2018-02-13 DIAGNOSIS — J9602 Acute respiratory failure with hypercapnia: Secondary | ICD-10-CM | POA: Diagnosis not present

## 2018-02-13 DIAGNOSIS — Z87891 Personal history of nicotine dependence: Secondary | ICD-10-CM | POA: Diagnosis not present

## 2018-02-13 DIAGNOSIS — F419 Anxiety disorder, unspecified: Secondary | ICD-10-CM | POA: Diagnosis not present

## 2018-02-13 DIAGNOSIS — J441 Chronic obstructive pulmonary disease with (acute) exacerbation: Secondary | ICD-10-CM | POA: Diagnosis not present

## 2018-02-13 DIAGNOSIS — M1990 Unspecified osteoarthritis, unspecified site: Secondary | ICD-10-CM | POA: Diagnosis not present

## 2018-02-16 DIAGNOSIS — J449 Chronic obstructive pulmonary disease, unspecified: Secondary | ICD-10-CM | POA: Diagnosis not present

## 2018-02-21 ENCOUNTER — Emergency Department (HOSPITAL_COMMUNITY): Payer: Medicare HMO

## 2018-02-21 ENCOUNTER — Other Ambulatory Visit: Payer: Self-pay

## 2018-02-21 ENCOUNTER — Inpatient Hospital Stay (HOSPITAL_COMMUNITY)
Admission: EM | Admit: 2018-02-21 | Discharge: 2018-02-26 | DRG: 918 | Disposition: A | Discharge status: Other Acute Inpatient Hospital | Payer: Medicare HMO | Attending: Family Medicine | Admitting: Family Medicine

## 2018-02-21 ENCOUNTER — Encounter (HOSPITAL_COMMUNITY): Payer: Self-pay | Admitting: General Practice

## 2018-02-21 DIAGNOSIS — M6282 Rhabdomyolysis: Secondary | ICD-10-CM | POA: Diagnosis not present

## 2018-02-21 DIAGNOSIS — F332 Major depressive disorder, recurrent severe without psychotic features: Secondary | ICD-10-CM | POA: Diagnosis present

## 2018-02-21 DIAGNOSIS — T50902A Poisoning by unspecified drugs, medicaments and biological substances, intentional self-harm, initial encounter: Secondary | ICD-10-CM

## 2018-02-21 DIAGNOSIS — E161 Other hypoglycemia: Secondary | ICD-10-CM | POA: Diagnosis not present

## 2018-02-21 DIAGNOSIS — E860 Dehydration: Secondary | ICD-10-CM | POA: Diagnosis present

## 2018-02-21 DIAGNOSIS — Z79899 Other long term (current) drug therapy: Secondary | ICD-10-CM

## 2018-02-21 DIAGNOSIS — K219 Gastro-esophageal reflux disease without esophagitis: Secondary | ICD-10-CM | POA: Diagnosis present

## 2018-02-21 DIAGNOSIS — M810 Age-related osteoporosis without current pathological fracture: Secondary | ICD-10-CM | POA: Diagnosis present

## 2018-02-21 DIAGNOSIS — R45851 Suicidal ideations: Secondary | ICD-10-CM | POA: Diagnosis present

## 2018-02-21 DIAGNOSIS — D72829 Elevated white blood cell count, unspecified: Secondary | ICD-10-CM

## 2018-02-21 DIAGNOSIS — Z961 Presence of intraocular lens: Secondary | ICD-10-CM | POA: Diagnosis not present

## 2018-02-21 DIAGNOSIS — Z7951 Long term (current) use of inhaled steroids: Secondary | ICD-10-CM

## 2018-02-21 DIAGNOSIS — D72828 Other elevated white blood cell count: Secondary | ICD-10-CM | POA: Diagnosis not present

## 2018-02-21 DIAGNOSIS — Z90710 Acquired absence of both cervix and uterus: Secondary | ICD-10-CM

## 2018-02-21 DIAGNOSIS — T380X5A Adverse effect of glucocorticoids and synthetic analogues, initial encounter: Secondary | ICD-10-CM | POA: Diagnosis present

## 2018-02-21 DIAGNOSIS — F322 Major depressive disorder, single episode, severe without psychotic features: Secondary | ICD-10-CM | POA: Diagnosis not present

## 2018-02-21 DIAGNOSIS — R5381 Other malaise: Secondary | ICD-10-CM | POA: Diagnosis present

## 2018-02-21 DIAGNOSIS — T43292A Poisoning by other antidepressants, intentional self-harm, initial encounter: Principal | ICD-10-CM | POA: Diagnosis present

## 2018-02-21 DIAGNOSIS — R079 Chest pain, unspecified: Secondary | ICD-10-CM | POA: Diagnosis not present

## 2018-02-21 DIAGNOSIS — J449 Chronic obstructive pulmonary disease, unspecified: Secondary | ICD-10-CM | POA: Diagnosis present

## 2018-02-21 DIAGNOSIS — F419 Anxiety disorder, unspecified: Secondary | ICD-10-CM | POA: Diagnosis not present

## 2018-02-21 DIAGNOSIS — R338 Other retention of urine: Secondary | ICD-10-CM | POA: Diagnosis present

## 2018-02-21 DIAGNOSIS — Z87442 Personal history of urinary calculi: Secondary | ICD-10-CM | POA: Diagnosis not present

## 2018-02-21 DIAGNOSIS — Z720 Tobacco use: Secondary | ICD-10-CM | POA: Diagnosis not present

## 2018-02-21 DIAGNOSIS — Z87891 Personal history of nicotine dependence: Secondary | ICD-10-CM | POA: Diagnosis not present

## 2018-02-21 DIAGNOSIS — R1310 Dysphagia, unspecified: Secondary | ICD-10-CM | POA: Diagnosis present

## 2018-02-21 DIAGNOSIS — T1491XA Suicide attempt, initial encounter: Secondary | ICD-10-CM

## 2018-02-21 DIAGNOSIS — R000 Tachycardia, unspecified: Secondary | ICD-10-CM | POA: Diagnosis not present

## 2018-02-21 DIAGNOSIS — E162 Hypoglycemia, unspecified: Secondary | ICD-10-CM | POA: Diagnosis not present

## 2018-02-21 DIAGNOSIS — R251 Tremor, unspecified: Secondary | ICD-10-CM | POA: Diagnosis present

## 2018-02-21 DIAGNOSIS — R0902 Hypoxemia: Secondary | ICD-10-CM | POA: Diagnosis not present

## 2018-02-21 DIAGNOSIS — Z9842 Cataract extraction status, left eye: Secondary | ICD-10-CM | POA: Diagnosis not present

## 2018-02-21 DIAGNOSIS — E559 Vitamin D deficiency, unspecified: Secondary | ICD-10-CM | POA: Diagnosis not present

## 2018-02-21 DIAGNOSIS — R531 Weakness: Secondary | ICD-10-CM | POA: Diagnosis not present

## 2018-02-21 DIAGNOSIS — R4182 Altered mental status, unspecified: Secondary | ICD-10-CM | POA: Diagnosis not present

## 2018-02-21 HISTORY — DX: Major depressive disorder, single episode, unspecified: F32.9

## 2018-02-21 HISTORY — DX: Suicide attempt, initial encounter: T14.91XA

## 2018-02-21 HISTORY — DX: Anxiety disorder, unspecified: F41.9

## 2018-02-21 HISTORY — DX: Depression, unspecified: F32.A

## 2018-02-21 LAB — I-STAT VENOUS BLOOD GAS, ED
ACID-BASE DEFICIT: 5 mmol/L — AB (ref 0.0–2.0)
Bicarbonate: 20.6 mmol/L (ref 20.0–28.0)
O2 Saturation: 70 %
PCO2 VEN: 39.2 mmHg — AB (ref 44.0–60.0)
PO2 VEN: 39 mmHg (ref 32.0–45.0)
TCO2: 22 mmol/L (ref 22–32)
pH, Ven: 7.33 (ref 7.250–7.430)

## 2018-02-21 LAB — COMPREHENSIVE METABOLIC PANEL
ALK PHOS: 97 U/L (ref 38–126)
ALT: 23 U/L (ref 0–44)
ANION GAP: 11 (ref 5–15)
AST: 35 U/L (ref 15–41)
Albumin: 4.1 g/dL (ref 3.5–5.0)
BILIRUBIN TOTAL: 0.7 mg/dL (ref 0.3–1.2)
BUN: 36 mg/dL — ABNORMAL HIGH (ref 8–23)
CALCIUM: 8.8 mg/dL — AB (ref 8.9–10.3)
CO2: 21 mmol/L — ABNORMAL LOW (ref 22–32)
Chloride: 107 mmol/L (ref 98–111)
Creatinine, Ser: 1.03 mg/dL — ABNORMAL HIGH (ref 0.44–1.00)
GFR calc non Af Amer: 55 mL/min — ABNORMAL LOW (ref >60)
Glucose, Bld: 72 mg/dL (ref 70–99)
Potassium: 4.4 mmol/L (ref 3.5–5.1)
Sodium: 139 mmol/L (ref 135–145)
TOTAL PROTEIN: 7.5 g/dL (ref 6.5–8.1)

## 2018-02-21 LAB — DIFFERENTIAL
Abs Immature Granulocytes: 0.12 10*3/uL — ABNORMAL HIGH (ref 0.00–0.07)
BASOS ABS: 0 10*3/uL (ref 0.0–0.1)
Basophils Relative: 0 %
EOS PCT: 0 %
Eosinophils Absolute: 0 10*3/uL (ref 0.0–0.5)
Immature Granulocytes: 1 %
LYMPHS ABS: 1.1 10*3/uL (ref 0.7–4.0)
LYMPHS PCT: 5 %
MONO ABS: 1.5 10*3/uL — AB (ref 0.1–1.0)
Monocytes Relative: 7 %
Neutro Abs: 19.1 10*3/uL — ABNORMAL HIGH (ref 1.7–7.7)
Neutrophils Relative %: 87 %

## 2018-02-21 LAB — PROTIME-INR
INR: 0.99
INR: 0.97
PROTHROMBIN TIME: 13 s (ref 11.4–15.2)
Prothrombin Time: 12.8 seconds (ref 11.4–15.2)

## 2018-02-21 LAB — CBC
HEMATOCRIT: 44 % (ref 36.0–46.0)
HEMATOCRIT: 39.8 % (ref 36.0–46.0)
HEMOGLOBIN: 12.4 g/dL (ref 12.0–15.0)
Hemoglobin: 13.5 g/dL (ref 12.0–15.0)
MCH: 28.5 pg (ref 26.0–34.0)
MCH: 29 pg (ref 26.0–34.0)
MCHC: 30.7 g/dL (ref 30.0–36.0)
MCHC: 31.2 g/dL (ref 30.0–36.0)
MCV: 93 fL (ref 80.0–100.0)
MCV: 93 fL (ref 80.0–100.0)
NRBC: 0 % (ref 0.0–0.2)
Platelets: 327 10*3/uL (ref 150–400)
Platelets: 304 10*3/uL (ref 150–400)
RBC: 4.73 MIL/uL (ref 3.87–5.11)
RBC: 4.28 MIL/uL (ref 3.87–5.11)
RDW: 13.2 % (ref 11.5–15.5)
RDW: 13.3 % (ref 11.5–15.5)
WBC: 21.9 10*3/uL — AB (ref 4.0–10.5)
WBC: 22.4 10*3/uL — AB (ref 4.0–10.5)
nRBC: 0 % (ref 0.0–0.2)

## 2018-02-21 LAB — ETHANOL: Alcohol, Ethyl (B): <10 mg/dL (ref <10)

## 2018-02-21 LAB — I-STAT TROPONIN, ED: Troponin i, poc: 0.01 ng/mL (ref 0.00–0.08)

## 2018-02-21 LAB — CREATININE, SERUM
Creatinine, Ser: 0.87 mg/dL (ref 0.44–1.00)
GFR calc non Af Amer: >60 mL/min (ref >60)

## 2018-02-21 LAB — TSH: TSH: 1.7 u[IU]/mL (ref 0.350–4.500)

## 2018-02-21 LAB — URINALYSIS, ROUTINE W REFLEX MICROSCOPIC
Bilirubin Urine: NEGATIVE
Glucose, UA: NEGATIVE mg/dL
Hgb urine dipstick: NEGATIVE
KETONES UR: 20 mg/dL — AB
Leukocytes, UA: NEGATIVE
NITRITE: NEGATIVE
PH: 5 (ref 5.0–8.0)
Protein, ur: NEGATIVE mg/dL
SPECIFIC GRAVITY, URINE: 1.02 (ref 1.005–1.030)

## 2018-02-21 LAB — ACETAMINOPHEN LEVEL: Acetaminophen (Tylenol), Serum: <10 ug/mL — ABNORMAL LOW (ref 10–30)

## 2018-02-21 LAB — SALICYLATE LEVEL

## 2018-02-21 LAB — CK: Total CK: 2249 U/L — ABNORMAL HIGH (ref 38–234)

## 2018-02-21 LAB — RAPID URINE DRUG SCREEN, HOSP PERFORMED
Amphetamines: NOT DETECTED
Barbiturates: NOT DETECTED
Benzodiazepines: NOT DETECTED
Cocaine: NOT DETECTED
OPIATES: NOT DETECTED
Tetrahydrocannabinol: NOT DETECTED

## 2018-02-21 LAB — APTT: aPTT: 28 seconds (ref 24–36)

## 2018-02-21 MED ORDER — PANTOPRAZOLE SODIUM 40 MG PO TBEC
40.0000 mg | DELAYED_RELEASE_TABLET | Freq: Two times a day (BID) | ORAL | Status: DC
  Administered 2018-02-21 – 2018-02-26 (×10): 40 mg via ORAL
  Filled 2018-02-21 (×10): qty 1

## 2018-02-21 MED ORDER — HEPARIN SODIUM (PORCINE) 5000 UNIT/ML IJ SOLN
5000.0000 [IU] | Freq: Three times a day (TID) | INTRAMUSCULAR | Status: DC
  Administered 2018-02-21 – 2018-02-26 (×14): 5000 [IU] via SUBCUTANEOUS
  Filled 2018-02-21 (×12): qty 1

## 2018-02-21 MED ORDER — SODIUM CHLORIDE 0.9 % IV BOLUS (SEPSIS)
1000.0000 mL | Freq: Once | INTRAVENOUS | Status: AC
  Administered 2018-02-21: 1000 mL via INTRAVENOUS

## 2018-02-21 MED ORDER — IPRATROPIUM-ALBUTEROL 0.5-2.5 (3) MG/3ML IN SOLN
3.0000 mL | Freq: Four times a day (QID) | RESPIRATORY_TRACT | Status: DC
  Administered 2018-02-21: 3 mL via RESPIRATORY_TRACT
  Filled 2018-02-21: qty 3

## 2018-02-21 MED ORDER — ACETAMINOPHEN 650 MG RE SUPP: 650.0000 mg | Freq: Four times a day (QID) | RECTAL | Status: DC | PRN

## 2018-02-21 MED ORDER — TIOTROPIUM BROMIDE MONOHYDRATE 18 MCG IN CAPS: 18.0000 ug | ORAL_CAPSULE | Freq: Every day | RESPIRATORY_TRACT | Status: DC

## 2018-02-21 MED ORDER — ONDANSETRON HCL 4 MG/2ML IJ SOLN
4.0000 mg | Freq: Once | INTRAMUSCULAR | Status: AC
  Administered 2018-02-21: 4 mg via INTRAVENOUS
  Filled 2018-02-21: qty 2

## 2018-02-21 MED ORDER — ACETAMINOPHEN 325 MG PO TABS: 650.0000 mg | ORAL_TABLET | Freq: Four times a day (QID) | ORAL | Status: DC | PRN

## 2018-02-21 MED ORDER — SODIUM CHLORIDE 0.9 % IV BOLUS: 500.0000 mL | Freq: Once | INTRAVENOUS | Status: DC

## 2018-02-21 MED ORDER — BISACODYL 5 MG PO TBEC: 5.0000 mg | DELAYED_RELEASE_TABLET | Freq: Every day | ORAL | Status: DC | PRN

## 2018-02-21 MED ORDER — CALCIUM CARBONATE ANTACID 500 MG PO CHEW
400.0000 mg | CHEWABLE_TABLET | Freq: Once | ORAL | Status: AC
  Administered 2018-02-24: 400 mg via ORAL
  Filled 2018-02-21 (×2): qty 2

## 2018-02-21 MED ORDER — LACTATED RINGERS IV SOLN
INTRAVENOUS | Status: DC
  Administered 2018-02-21: 19:00:00 via INTRAVENOUS

## 2018-02-21 MED ORDER — GUAIFENESIN ER 600 MG PO TB12: 1200.0000 mg | ORAL_TABLET | Freq: Two times a day (BID) | ORAL | Status: DC

## 2018-02-21 MED ORDER — METOPROLOL TARTRATE 25 MG PO TABS
25.0000 mg | ORAL_TABLET | Freq: Two times a day (BID) | ORAL | Status: DC
  Administered 2018-02-21 – 2018-02-23 (×4): 25 mg via ORAL
  Filled 2018-02-21 (×4): qty 1

## 2018-02-21 MED ORDER — LACTATED RINGERS IV SOLN: INTRAVENOUS | Status: DC

## 2018-02-21 MED ORDER — ALBUTEROL SULFATE (2.5 MG/3ML) 0.083% IN NEBU
2.5000 mg | INHALATION_SOLUTION | RESPIRATORY_TRACT | Status: DC | PRN
  Administered 2018-02-25: 2.5 mg via RESPIRATORY_TRACT
  Filled 2018-02-21: qty 3

## 2018-02-21 MED ORDER — ALBUTEROL SULFATE HFA 108 (90 BASE) MCG/ACT IN AERS: 2.0000 | INHALATION_SPRAY | Freq: Four times a day (QID) | RESPIRATORY_TRACT | Status: DC | PRN

## 2018-02-21 MED ORDER — UMECLIDINIUM BROMIDE 62.5 MCG/INH IN AEPB
1.0000 | INHALATION_SPRAY | Freq: Every day | RESPIRATORY_TRACT | Status: DC
  Administered 2018-02-21 – 2018-02-26 (×4): 1 via RESPIRATORY_TRACT
  Filled 2018-02-21: qty 7

## 2018-02-21 NOTE — ED Notes (Signed)
Bladder scan indicated over 555 ml of urine.

## 2018-02-21 NOTE — ED Notes (Signed)
Bladder scan detected 555 ml of urine.

## 2018-02-21 NOTE — ED Notes (Signed)
Family at bedside. 

## 2018-02-21 NOTE — ED Triage Notes (Signed)
Per [facility], pt arriving from home where family last saw her 2 days ago. Pt found slouched on cough. Pt told ems she can not move either legs and endorses leg pain. Pt able to answer orientation questions but repeating "I want to go home." Family told EMS hx of similar events 1 year ago and she was diagnoses with a UTI then.

## 2018-02-21 NOTE — Progress Notes (Signed)
Arrival Method: Patient arrived from ED on stretcher accompanied by the family members. Mental Orientation:  Alert and oriented x 3, disoriented to time. Telemetry: 22M-03, ST. Assessment:  See doc flow sheets.  Skin: Dry but warm and intact. IV: Right FA. Pain: Denies any pain. Tubes: N/A Safety Measures: Bed in low position, call bell and phone within reach. Fall Prevention Safety Plan: Reviewed the plan, verbalzied understanding, bed alarm initiated. Admission Screening: In process. 6700 Orientation: Patient has been oriented to the unit, staff and to the room.

## 2018-02-21 NOTE — H&P (Addendum)
TRH H&P   Patient Demographics:    Lori Butler, is a 67 y.o. female  MRN: [id number]   DOB - 16-Jun-1950  Admit Date - 02/21/2018  Outpatient Primary MD for the patient is [name], MD  Outpatient Specialists: Dr Elsworth Soho    Patient coming from: Home  Chief Complaint  Patient presents with  . Extremity Weakness      HPI:    Lori Butler  is a 67 y.o. female, with history of COPD, mild depression, osteoporosis, GERD who lives at home by herself, has been having issues with some generalized weakness and deconditioning since her last admission to the hospital few months ago for COPD exacerbation, patient usually works part-time at [employer] but lately has been having hard time keeping up her job due to generalized weakness and deconditioning.  She has come under some financial stress due to this.  According to the family they are usually in touch with the patient on a daily basis however for the last 2 days they did not hear from her hands they went to check on her, her apartment was locked, it was open with the assistance of the apartment security.    Patient was found to be lying on her couch, she looked quite weak, she was there between 24 to 48 hours, she confessed to the family that she tried to hurt her self but taking 30 pills of Wellbutrin, according to the family it likely due to her generalized weakness and financial stress.  She was then brought to the hospital where her initial lab work suggested mild leukocytosis, dehydration with some sinus tachycardia, her head CT and chest x-ray were unremarkable.  I was requested to admit the patient for attempted suicide and intentional drug overdose.  She currently has no  subjective complaints and currently not suicidal or homicidal.  She does feel weak all over but no focal weakness, denies any aches or pains.  No cough or shortness of breath.  Denies any fevers.    Review of systems:    A full 10 point Review of Systems was done, except as stated above, all other Review of Systems were negative.   With Past History of the following :    Past Medical History:  Diagnosis Date  . Acute respiratory failure with hypoxia ([place])   . Aortic  atherosclerosis ([place])   . Arthritis    "a little in my hands probably" (10/31/2017)  . COPD (chronic obstructive pulmonary disease) ([place])   . COPD exacerbation ([place])   . History of kidney stones       Past Surgical History:  Procedure Laterality Date  . ABDOMINAL HYSTERECTOMY    . APPENDECTOMY    . BREAST BIOPSY Left 1990s  . CATARACT EXTRACTION W/ INTRAOCULAR LENS  IMPLANT, BILATERAL Bilateral   . DILATION AND CURETTAGE OF UTERUS    . TONSILLECTOMY    . TUBAL LIGATION        Social History:     Social History   Tobacco Use  . Smoking status: Former Smoker    Packs/day: 1.50    Years: 51.00    Pack years: 76.50    Types: Cigarettes    Last attempt to quit: 09/05/2017    Years since quitting: 0.4  . Smokeless tobacco: Never Used  Substance Use Topics  . Alcohol use: Not Currently    Comment: 10/31/2017 "couple drinks/year"      Family History :     Family History  Problem Relation Age of Onset  . Breast cancer Mother      Home Medications:   Prior to Admission medications   Medication Sig Start Date End Date Taking? Authorizing Provider  acetaminophen (TYLENOL) 500 MG tablet Take 1,000 mg by mouth every 6 (six) hours as needed for mild pain or headache.   Yes [provider]  alendronate (FOSAMAX) 70 MG tablet Take 70 mg by mouth every Wednesday.  07/11/17  Yes [provider]  budesonide-formoterol (SYMBICORT) 160-4.5 MCG/ACT inhaler Inhale 2 puffs into the lungs 2 (two) times  daily. 12/26/17  Yes Jennelle Human B, NP  buPROPion (WELLBUTRIN XL) 150 MG 24 hr tablet Take 150 mg by mouth every morning.   Yes [provider]  tiotropium (SPIRIVA HANDIHALER) 18 MCG inhalation capsule Place 1 capsule (18 mcg total) into inhaler and inhale daily. 01/03/18 01/03/19 Yes Fenton Foy, NP  Vitamin D, Ergocalciferol, (DRISDOL) 50000 units CAPS capsule Take 2,000 Units by mouth every Wednesday.    Yes [provider]  albuterol (PROVENTIL HFA;VENTOLIN HFA) 108 (90 Base) MCG/ACT inhaler Inhale 2 puffs into the lungs every 6 (six) hours as needed for wheezing or shortness of breath. 09/16/17   Sheikh, Georgina Quint Latif, DO  albuterol (PROVENTIL) (2.5 MG/3ML) 0.083% nebulizer solution Take 3 mLs (2.5 mg total) by nebulization every 4 (four) hours as needed for wheezing or shortness of breath. 09/16/17   Sheikh, Omair Latif, DO  buPROPion (WELLBUTRIN SR) 100 MG 12 hr tablet Take 1 tablet (100 mg total) by mouth daily. 11/03/17   Bonnielee Haff, MD  guaiFENesin (MUCINEX) 600 MG 12 hr tablet Take 2 tablets (1,200 mg total) by mouth 2 (two) times daily. Patient taking differently: Take 600 mg by mouth 2 (two) times daily.  09/16/17   Sheikh, Omair Latif, DO  montelukast (SINGULAIR) 10 MG tablet Take 1 tablet (10 mg total) by mouth at bedtime. 12/26/17   Arnell Asal, NP  pantoprazole (PROTONIX) 40 MG tablet Take 1 tablet (40 mg total) by mouth 2 (two) times daily. 12/26/17   Arnell Asal, NP  predniSONE (DELTASONE) 10 MG tablet Take 2 tablets daily with food for 5 days, then 1 tablet daily with food for 5 days then stop. Patient not taking: Reported on 02/21/2018 02/05/18   Rigoberto Noel, MD     Allergies:  Allergies  Allergen Reactions  . Demerol [Meperidine] Other (See Comments)    "horrible reaction"  . Oxycodone-Acetaminophen Other (See Comments)    Upset stomach   . Tramadol Nausea And Vomiting     Physical Exam:   Vitals     Blood pressure 140/84, pulse  (!) 109, temperature 97.6 F (36.4 C), temperature source Oral, resp. rate (!) 26, SpO2 96 %.   1. General frail elderly Caucasian female lying in hospital bed appearing quite weak and dehydrated with dry oral mucosa, in no distress,  2. Normal affect and insight, Not Suicidal or Homicidal, Awake Alert, Oriented X 3.  3. No F.N deficits, ALL C.Nerves Intact, Strength 5/5 all 4 extremities, Sensation intact all 4 extremities, Plantars down going.  4. Ears and Eyes appear Normal, Conjunctivae clear, PERRLA.    5. Supple Neck, No JVD, No cervical lymphadenopathy appriciated, No Carotid Bruits.  6. Symmetrical Chest wall movement, Good air movement bilaterally, CTAB.  7. RRR, No Gallops, Rubs or Murmurs, No Parasternal Heave.  8. Positive Bowel Sounds, Abdomen Soft, No tenderness, No organomegaly appriciated,No rebound -guarding or rigidity.  9.  No Cyanosis, Normal Skin Turgor, No Skin Rash or Bruise.  10. Good muscle tone,  joints appear normal , no effusions, Normal ROM.  11. No Palpable Lymph Nodes in Neck or Axillae     Data Review:    CBC Recent Labs  Lab 02/21/18 1404  WBC 21.9*  HGB 13.5  HCT 44.0  PLT 327  MCV 93.0  MCH 28.5  MCHC 30.7  RDW 13.2  LYMPHSABS 1.1  MONOABS 1.5*  EOSABS 0.0  BASOSABS 0.0   ------------------------------------------------------------------------------------------------------------------  Chemistries  Recent Labs  Lab 02/21/18 1404  NA 139  K 4.4  CL 107  CO2 21*  GLUCOSE 72  BUN 36*  CREATININE 1.03*  CALCIUM 8.8*  AST 35  ALT 23  ALKPHOS 97  BILITOT 0.7   ------------------------------------------------------------------------------------------------------------------ CrCl cannot be calculated (Unknown ideal weight.). ------------------------------------------------------------------------------------------------------------------ No results for input(s): TSH, T4TOTAL, T3FREE, THYROIDAB in the last 72  hours.  Invalid input(s): FREET3  Coagulation profile Recent Labs  Lab 02/21/18 1404  INR 0.99   ------------------------------------------------------------------------------------------------------------------- No results for input(s): DDIMER in the last 72 hours. -------------------------------------------------------------------------------------------------------------------  Cardiac Enzymes No results for input(s): CKMB, TROPONINI, MYOGLOBIN in the last 168 hours.  Invalid input(s): CK ------------------------------------------------------------------------------------------------------------------ No results found for: BNP   ---------------------------------------------------------------------------------------------------------------  Urinalysis    Component Value Date/Time   COLORURINE YELLOW 10/30/2017 [place] 10/30/2017 1015   LABSPEC 1.026 10/30/2017 1015   PHURINE 5.0 10/30/2017 1015   GLUCOSEU NEGATIVE 10/30/2017 1015   HGBUR NEGATIVE 10/30/2017 1015   BILIRUBINUR NEGATIVE 10/30/2017 1015   KETONESUR 20 (A) 10/30/2017 1015   PROTEINUR NEGATIVE 10/30/2017 1015   UROBILINOGEN 1.0 07/12/2012 1842   NITRITE NEGATIVE 10/30/2017 1015   LEUKOCYTESUR SMALL (A) 10/30/2017 1015    ----------------------------------------------------------------------------------------------------------------   Imaging Results:    Dg Chest 2 View  Result Date: 02/21/2018 CLINICAL DATA:  Chest pain and weakness. Acute respiratory failure, COPD, former smoker. Possible overdose. EXAM: CHEST - 2 VIEW COMPARISON:  Portable chest x-ray of December 25, 2017 FINDINGS: There is mild chronic elevation of the right hemidiaphragm. The lungs are adequately inflated. The interstitial markings are coarse. There is no alveolar infiltrate. There is no pleural effusion. The heart and pulmonary vascularity are normal. The mediastinum is normal in width. The bony thorax exhibits no  acute abnormality. IMPRESSION: Mild chronic bronchitic changes, stable. No  pneumonia, CHF, nor other acute cardiopulmonary abnormality. Electronically Signed   By: David  Martinique M.D.   On: 02/21/2018 14:38   Ct Head Wo Contrast  Result Date: 02/21/2018 CLINICAL DATA:  Altered mental status today. EXAM: CT HEAD WITHOUT CONTRAST TECHNIQUE: Contiguous axial images were obtained from the base of the skull through the vertex without intravenous contrast. COMPARISON:  Brain MRI 10/30/2017.  Head CT scan 10/29/2017. FINDINGS: Brain: No evidence of acute infarction, hemorrhage, hydrocephalus, extra-axial collection or mass lesion/mass effect. Mild atrophy and chronic microvascular ischemic change noted. Vascular: Atherosclerosis is seen. Skull: Intact.  No focal lesion. Sinuses/Orbits: Negative. Other: None. IMPRESSION: No acute abnormality. Stable appearance of atrophy and chronic microvascular ischemic change. Atherosclerosis. Electronically Signed   By: Inge Rise M.D.   On: 02/21/2018 14:22    My personal review of EKG: Rhythm sinus tachycardia, Rate 105 /min, QTc 467 ms, QRS 84 ms, no Acute ST changes   Assessment & Plan:     1.  Attempted suicide due to Wellbutrin overdose.  Will be monitored on telemetry, IV fluids for hydration, hold offending medication, follow EKGs, psych evaluation in the morning.    2.  COPD.  At baseline no acute issues.  Continue supportive care with nebulizer treatments and oxygen as needed.  3.  Leukocytosis.  Unclear etiology, she has had leukocytosis consistently for the last several months intermittently, no source of infection, chest x-ray stable, she is afebrile, she does take prednisone off and on and this could account for it, will check a UA, also check a bladder scan to rule out retention.  Monitor clinically.  4.  Severe dehydration.  Normal saline bolus followed by maintenance IV fluids, check and monitor CK levels.  5.  Generalized weakness and  deconditioning -PT eval, may require placement.  7. S.Tach - hydrate, check TSH, low dose B.Blocker if BP elevated.    DVT Prophylaxis Heparin    AM Labs Ordered, also please review Full Orders  Family Communication: Admission, patients condition and plan of care including tests being ordered have been discussed with the patient and family who indicate understanding and agree with the plan and Code Status.  Code Status Full  Likely DC to  SNF  Condition GUARDED    Consults called: Psych ( Epic)    Admission status: Inpt    Time spent in minutes : 35   Lala Lund M.D on 02/21/2018 at 4:15 PM  To page go to [email or web address] - password [facility]

## 2018-02-21 NOTE — ED Provider Notes (Signed)
[facility] EMERGENCY DEPARTMENT Provider Note   CSN: [id number] Arrival date & time: 02/21/18  1227     History   Chief Complaint Chief Complaint  Patient presents with  . Extremity Weakness    HPI Shametra Cumberland is a 67 y.o. female.  HPI Pt lives at home by herself.  Family went to check on her today because she was not answering the phone.  THey had not heard from her for a couple of days.  Pt states she heard the phone but could not get to the phone.  She was not able to walk.  Both of her legs were very weak.  Pt states she was not able to walk to the bathroom.  No fevers.  No vomiting or diarrhea.  Pt has not been urinating as much.  NO headache.  She has chronic shortness of breath.  Past Medical History:  Diagnosis Date  . Acute respiratory failure with hypoxia ([place])   . Aortic atherosclerosis ([place])   . Arthritis    "a little in my hands probably" (10/31/2017)  . COPD (chronic obstructive pulmonary disease) ([place])   . COPD exacerbation ([place])   . History of kidney stones     Patient Active Problem List   Diagnosis Date Noted  . Attempted suicide ([place]) 02/21/2018  . Hypoxia   . Physical deconditioning   . COPD exacerbation ([place])   . Vitamin B12 deficiency 11/10/2017  . Osteoporosis 11/10/2017  . Vitamin D deficiency 11/10/2017  . Tobacco abuse 09/20/2017  . COPD (chronic obstructive pulmonary disease) ([place]) 09/15/2017    Past Surgical History:  Procedure Laterality Date  . ABDOMINAL HYSTERECTOMY    . APPENDECTOMY    . BREAST BIOPSY Left 1990s  . CATARACT EXTRACTION W/ INTRAOCULAR LENS  IMPLANT, BILATERAL Bilateral   . DILATION AND CURETTAGE OF UTERUS    . TONSILLECTOMY    . TUBAL LIGATION       OB History   None      Home Medications    Prior to Admission medications   Medication Sig Start Date End Date Taking? Authorizing Provider  acetaminophen (TYLENOL) 500 MG tablet Take 1,000 mg by mouth every 6 (six) hours as needed for  mild pain or headache.    [provider]  albuterol (PROVENTIL HFA;VENTOLIN HFA) 108 (90 Base) MCG/ACT inhaler Inhale 2 puffs into the lungs every 6 (six) hours as needed for wheezing or shortness of breath. 09/16/17   Sheikh, Georgina Quint Latif, DO  albuterol (PROVENTIL) (2.5 MG/3ML) 0.083% nebulizer solution Take 3 mLs (2.5 mg total) by nebulization every 4 (four) hours as needed for wheezing or shortness of breath. 09/16/17   Raiford Noble Latif, DO  alendronate (FOSAMAX) 70 MG tablet Take 70 mg by mouth every Wednesday.  07/11/17   [provider]  budesonide-formoterol (SYMBICORT) 160-4.5 MCG/ACT inhaler Inhale 2 puffs into the lungs 2 (two) times daily. 12/26/17   Arnell Asal, NP  buPROPion (WELLBUTRIN SR) 100 MG 12 hr tablet Take 1 tablet (100 mg total) by mouth daily. 11/03/17   Bonnielee Haff, MD  guaiFENesin (MUCINEX) 600 MG 12 hr tablet Take 2 tablets (1,200 mg total) by mouth 2 (two) times daily. Patient taking differently: Take 600 mg by mouth 2 (two) times daily.  09/16/17   Sheikh, Omair Latif, DO  montelukast (SINGULAIR) 10 MG tablet Take 1 tablet (10 mg total) by mouth at bedtime. 12/26/17   Arnell Asal, NP  pantoprazole (PROTONIX) 40 MG tablet Take  1 tablet (40 mg total) by mouth 2 (two) times daily. 12/26/17   Arnell Asal, NP  predniSONE (DELTASONE) 10 MG tablet Take 2 tablets daily with food for 5 days, then 1 tablet daily with food for 5 days then stop. 02/05/18   Rigoberto Noel, MD  tiotropium (SPIRIVA HANDIHALER) 18 MCG inhalation capsule Place 1 capsule (18 mcg total) into inhaler and inhale daily. 01/03/18 01/03/19  Fenton Foy, NP  Vitamin D, Ergocalciferol, (DRISDOL) 50000 units CAPS capsule Take 2,000 Units by mouth every Wednesday.     [provider]    Family History Family History  Problem Relation Age of Onset  . Breast cancer Mother     Social History Social History   Tobacco Use  . Smoking status: Former Smoker    Packs/day:  1.50    Years: 51.00    Pack years: 76.50    Types: Cigarettes    Last attempt to quit: 09/05/2017    Years since quitting: 0.4  . Smokeless tobacco: Never Used  Substance Use Topics  . Alcohol use: Not Currently    Comment: 10/31/2017 "couple drinks/year"  . Drug use: Never     Allergies   Demerol [meperidine]; Oxycodone-acetaminophen; and Tramadol   Review of Systems Review of Systems  All other systems reviewed and are negative.    Physical Exam Updated Vital Signs BP 140/84   Pulse (!) 109   Temp 97.6 F (36.4 C) (Oral)   Resp (!) 26   SpO2 96%   Physical Exam  HENT:  Head: Normocephalic and atraumatic.  Right Ear: External ear normal.  Left Ear: External ear normal.  Mouth/Throat: No oropharyngeal exudate.  Mm dry   Eyes: Conjunctivae are normal. Right eye exhibits no discharge. Left eye exhibits no discharge. No scleral icterus.  Neck: Neck supple. No tracheal deviation present.  Cardiovascular: Regular rhythm and intact distal pulses. Tachycardia present.  Pulmonary/Chest: Effort normal and breath sounds normal. No stridor. No respiratory distress. She has no wheezes. She has no rales.  Abdominal: Soft. Bowel sounds are normal. She exhibits no distension. There is no tenderness. There is no rebound and no guarding.  Musculoskeletal: She exhibits no edema or tenderness.  Normal pulses  Neurological: She is alert. She displays tremor. No cranial nerve deficit (no facial droop, extraocular movements intact, no slurred speech) or sensory deficit. She exhibits normal muscle tone. She displays no seizure activity. Coordination abnormal.  Oriented to person and place, unaware of date, able to lift both arms without pronator drift although with tremor, unable to lift either leg of the bed for more than a few seconds  Skin: Skin is warm and dry. No rash noted. She is not diaphoretic.  Psychiatric: Her affect is blunt. Her speech is delayed. She is withdrawn. She is not  agitated and not aggressive.  Nursing note and vitals reviewed.    ED Treatments / Results  Labs (all labs ordered are listed, but only abnormal results are displayed) Labs Reviewed  CBC - Abnormal; Notable for the following components:      Result Value   WBC 21.9 (*)    All other components within normal limits  DIFFERENTIAL - Abnormal; Notable for the following components:   Neutro Abs 19.1 (*)    Monocytes Absolute 1.5 (*)    Abs Immature Granulocytes 0.12 (*)    All other components within normal limits  COMPREHENSIVE METABOLIC PANEL - Abnormal; Notable for the following components:   CO2 21 (*)  BUN 36 (*)    Creatinine, Ser 1.03 (*)    Calcium 8.8 (*)    GFR calc non Af Amer 55 (*)    All other components within normal limits  ACETAMINOPHEN LEVEL - Abnormal; Notable for the following components:   Acetaminophen (Tylenol), Serum <10 (*)    All other components within normal limits  I-STAT VENOUS BLOOD GAS, ED - Abnormal; Notable for the following components:   pCO2, Ven 39.2 (*)    Acid-base deficit 5.0 (*)    All other components within normal limits  ETHANOL  PROTIME-INR  APTT  SALICYLATE LEVEL  RAPID URINE DRUG SCREEN, HOSP PERFORMED  URINALYSIS, ROUTINE W REFLEX MICROSCOPIC  I-STAT TROPONIN, ED    EKG EKG Interpretation  Date/Time:  Thursday February 21 2018 12:36:25 EDT Ventricular Rate:  104 PR Interval:    QRS Duration: 84 QT Interval:  355 QTC Calculation: 467 R Axis:   93 Text Interpretation:  Sinus tachycardia Right atrial enlargement Right axis deviation No significant change since last tracing Confirmed by Dorie Rank [phone]) on 02/21/2018 12:43:03 PM Also confirmed by Dorie Rank [phone]), editor Hattie Perch [phone])  on 02/21/2018 2:04:09 PM   Radiology Dg Chest 2 View  Result Date: 02/21/2018 CLINICAL DATA:  Chest pain and weakness. Acute respiratory failure, COPD, former smoker. Possible overdose. EXAM: CHEST - 2 VIEW COMPARISON:   Portable chest x-ray of December 25, 2017 FINDINGS: There is mild chronic elevation of the right hemidiaphragm. The lungs are adequately inflated. The interstitial markings are coarse. There is no alveolar infiltrate. There is no pleural effusion. The heart and pulmonary vascularity are normal. The mediastinum is normal in width. The bony thorax exhibits no acute abnormality. IMPRESSION: Mild chronic bronchitic changes, stable. No pneumonia, CHF, nor other acute cardiopulmonary abnormality. Electronically Signed   By: David  Martinique M.D.   On: 02/21/2018 14:38   Ct Head Wo Contrast  Result Date: 02/21/2018 CLINICAL DATA:  Altered mental status today. EXAM: CT HEAD WITHOUT CONTRAST TECHNIQUE: Contiguous axial images were obtained from the base of the skull through the vertex without intravenous contrast. COMPARISON:  Brain MRI 10/30/2017.  Head CT scan 10/29/2017. FINDINGS: Brain: No evidence of acute infarction, hemorrhage, hydrocephalus, extra-axial collection or mass lesion/mass effect. Mild atrophy and chronic microvascular ischemic change noted. Vascular: Atherosclerosis is seen. Skull: Intact.  No focal lesion. Sinuses/Orbits: Negative. Other: None. IMPRESSION: No acute abnormality. Stable appearance of atrophy and chronic microvascular ischemic change. Atherosclerosis. Electronically Signed   By: Inge Rise M.D.   On: 02/21/2018 14:22    Procedures .Critical Care Performed by: Dorie Rank, MD Authorized by: Dorie Rank, MD   Critical care provider statement:    Critical care time (minutes):  30   Critical care was time spent personally by me on the following activities:  Discussions with consultants, evaluation of patient's response to treatment, examination of patient, ordering and performing treatments and interventions, ordering and review of laboratory studies, ordering and review of radiographic studies, pulse oximetry, re-evaluation of patient's condition, obtaining history from patient  or surrogate and review of old charts   (including critical care time)  Medications Ordered in ED Medications  ondansetron (ZOFRAN) injection 4 mg (has no administration in time range)  sodium chloride 0.9 % bolus 1,000 mL (has no administration in time range)  calcium carbonate (TUMS - dosed in mg elemental calcium) chewable tablet 400 mg of elemental calcium (has no administration in time range)  sodium chloride 0.9 % bolus 1,000 mL (1,000  mLs Intravenous New Bag/Given 02/21/18 1500)     Initial Impression / Assessment and Plan / ED Course  I have reviewed the triage vital signs and the nursing notes.  Pertinent labs & imaging results that were available during my care of the patient were reviewed by me and considered in my medical decision making (see chart for details).  Clinical Course as of Feb 21 1550  Thu Feb 21, 2018  1347 Family arrived.  They state that pt told them she took too many welbutrin   [JA]  2505 Discussed with [place] poison control.  Watch for seizure, cardiac dysrhythmia.  Monitor potassium and calcium (potassium greater than 4, calcium greater than 9).   [LZ]  7673 Labs reviewed.  No acidosis on VBG.  Patient does have a leukocytosis.  Electrolytes notable for an elevated BUN and creatinine.  Calcium also slightly decreased.  X-ray without signs of pneumonia.  No abnormal findings on head CT   [JK]    Clinical Course User Index [JK] Dorie Rank, MD    She presented to the emergency room for evaluation of altered mental status.  While the patient was here she admitted that she overdosed on Wellbutrin.  Patient has remained tachycardic but otherwise stable in the emergency room.  She has been treated with IV fluids.  I have also ordered a dose of calcium orally.  Patient does have a leukocytosis.  Source of that is not entirely clear at this time.  Urinalysis is pending.  Currently there is no signs of infection to suggest sepsis.  We will continue to monitor  closely.  I will consult the medical service for admission and further treatment  Final Clinical Impressions(s) / ED Diagnoses   Final diagnoses:  Intentional drug overdose, initial encounter ([place])  Leukocytosis, unspecified type  Dehydration      Dorie Rank, MD 02/21/18 1551

## 2018-02-21 NOTE — ED Notes (Signed)
Patient transported to X-ray 

## 2018-02-21 NOTE — ED Notes (Signed)
Patient transported to CT 

## 2018-02-22 ENCOUNTER — Other Ambulatory Visit: Payer: Self-pay | Admitting: *Deleted

## 2018-02-22 DIAGNOSIS — Z720 Tobacco use: Secondary | ICD-10-CM

## 2018-02-22 DIAGNOSIS — R5381 Other malaise: Secondary | ICD-10-CM

## 2018-02-22 DIAGNOSIS — J449 Chronic obstructive pulmonary disease, unspecified: Secondary | ICD-10-CM

## 2018-02-22 DIAGNOSIS — T1491XA Suicide attempt, initial encounter: Secondary | ICD-10-CM

## 2018-02-22 DIAGNOSIS — F419 Anxiety disorder, unspecified: Secondary | ICD-10-CM

## 2018-02-22 DIAGNOSIS — T43292A Poisoning by other antidepressants, intentional self-harm, initial encounter: Principal | ICD-10-CM

## 2018-02-22 LAB — COMPREHENSIVE METABOLIC PANEL
ALBUMIN: 3.1 g/dL — AB (ref 3.5–5.0)
ALT: 24 U/L (ref 0–44)
ANION GAP: 11 (ref 5–15)
AST: 48 U/L — AB (ref 15–41)
Alkaline Phosphatase: 72 U/L (ref 38–126)
BILIRUBIN TOTAL: 0.6 mg/dL (ref 0.3–1.2)
BUN: 24 mg/dL — AB (ref 8–23)
CHLORIDE: 110 mmol/L (ref 98–111)
CO2: 17 mmol/L — ABNORMAL LOW (ref 22–32)
Calcium: 7.7 mg/dL — ABNORMAL LOW (ref 8.9–10.3)
Creatinine, Ser: 0.7 mg/dL (ref 0.44–1.00)
GFR calc Af Amer: >60 mL/min (ref >60)
Glucose, Bld: 55 mg/dL — ABNORMAL LOW (ref 70–99)
POTASSIUM: 4.2 mmol/L (ref 3.5–5.1)
Sodium: 138 mmol/L (ref 135–145)
TOTAL PROTEIN: 5.5 g/dL — AB (ref 6.5–8.1)

## 2018-02-22 LAB — CBC
HEMATOCRIT: 36.5 % (ref 36.0–46.0)
Hemoglobin: 11.3 g/dL — ABNORMAL LOW (ref 12.0–15.0)
MCH: 28.9 pg (ref 26.0–34.0)
MCHC: 31 g/dL (ref 30.0–36.0)
MCV: 93.4 fL (ref 80.0–100.0)
NRBC: 0 % (ref 0.0–0.2)
Platelets: 258 10*3/uL (ref 150–400)
RBC: 3.91 MIL/uL (ref 3.87–5.11)
RDW: 13.6 % (ref 11.5–15.5)
WBC: 16.1 10*3/uL — AB (ref 4.0–10.5)

## 2018-02-22 LAB — CK: CK TOTAL: 1745 U/L — AB (ref 38–234)

## 2018-02-22 LAB — SALICYLATE LEVEL

## 2018-02-22 MED ORDER — LACTATED RINGERS IV SOLN
INTRAVENOUS | Status: DC
  Administered 2018-02-22 – 2018-02-23 (×2): via INTRAVENOUS

## 2018-02-22 MED ORDER — GUAIFENESIN ER 600 MG PO TB12
600.0000 mg | ORAL_TABLET | Freq: Two times a day (BID) | ORAL | Status: DC
  Administered 2018-02-22 – 2018-02-26 (×9): 600 mg via ORAL
  Filled 2018-02-22 (×9): qty 1

## 2018-02-22 NOTE — Evaluation (Addendum)
Physical Therapy Evaluation Patient Details Name: Lori Butler MRN: [id number] DOB: Oct 10, 1950 Today's Date: 02/22/2018   History of Present Illness  67 y.o. female, with history of COPD, mild depression, osteoporosis, GERD who lives at home by herself, has been having issues with some generalized weakness and deconditioning since her last admission to the hospital few months ago for COPD exacerbation.  Admitted for Wellbutrin overdose, leukocytosis, severe dehydration.      Clinical Impression  Pt admitted with above diagnosis. Pt currently with functional limitations due to the deficits listed below (see PT Problem List). On eval, pt required mod assist bed mobility, min assist sit to stand and min assist ambulation 65 feet with RW. Pt demonstrates deficits in balance and activity tolerance. Pt will benefit from skilled PT to increase their independence and safety with mobility to allow discharge to the venue listed below.       Follow Up Recommendations SNF(Pt prefers [facility].)    Equipment [facility] walker with 5" wheels    Recommendations for Other Services       Precautions / Restrictions Precautions Precautions: Fall      Mobility  Bed Mobility Overal bed mobility: Needs Assistance Bed Mobility: Supine to Sit;Sit to Supine     Supine to sit: Mod assist Sit to supine: Mod assist   General bed mobility comments: cues for sequencing, increased time and effort  Transfers Overall transfer level: Needs assistance   Transfers: Sit to/from Stand Sit to Stand: Min assist         General transfer comment: cues for hand placement, assist to power up  Ambulation/Gait Ambulation/Gait assistance: Min assist Gait Distance (Feet): 65 Feet Assistive device: Rolling walker (2 wheeled) Gait Pattern/deviations: Step-through pattern;Decreased stride length Gait velocity: decreased Gait velocity interpretation: <1.8 ft/sec, indicate of risk for recurrent  falls General Gait Details: cues for RW management and posture  Stairs            Wheelchair Mobility    Modified Rankin (Stroke Patients Only)       Balance Overall balance assessment: Needs assistance Sitting-balance support: Feet supported;No upper extremity supported Sitting balance-Leahy Scale: Fair     Standing balance support: Bilateral upper extremity supported;During functional activity Standing balance-Leahy Scale: Poor Standing balance comment: heavy reliance on BUE                             Pertinent Vitals/Pain Pain Assessment: No/denies pain(c/o stiffness)    Home Living Family/patient expects to be discharged to:: Private residence Living Arrangements: Alone Available Help at Discharge: Family;Available PRN/intermittently Type of Home: Apartment Home Access: Stairs to enter Entrance Stairs-Rails: None Entrance Stairs-Number of Steps: 3 (wide enough to fit RW on each step) Home Layout: One level Home Equipment: None      Prior Function Level of Independence: Independent         Comments: was working part time at [employer] prior to hospitalization in Aug 2019     Hand Dominance   Dominant Hand: Right    Extremity/Trunk Assessment   Upper Extremity Assessment Upper Extremity Assessment: Overall WFL for tasks assessed    Lower Extremity Assessment Lower Extremity Assessment: Generalized weakness    Cervical / Trunk Assessment Cervical / Trunk Assessment: Normal  Communication   Communication: No difficulties  Cognition Arousal/Alertness: Awake/alert Behavior During Therapy: WFL for tasks assessed/performed;Flat affect Overall Cognitive Status: Within Functional Limits for tasks assessed  General Comments      Exercises     Assessment/Plan    PT Assessment Patient needs continued PT services  PT Problem List Decreased strength;Decreased mobility;Decreased  activity tolerance;Decreased balance;Decreased knowledge of use of DME       PT Treatment Interventions DME instruction;Therapeutic activities;Gait training;Therapeutic exercise;Patient/family education;Balance training;Stair training;Functional mobility training    PT Goals (Current goals can be found in the Care Plan section)  Acute Rehab PT Goals Patient Stated Goal: return to work PT Goal Formulation: With patient/family Time For Goal Achievement: 03/08/18 Potential to Achieve Goals: Good    Frequency Min 3X/week   Barriers to discharge Decreased caregiver support      Co-evaluation               AM-PAC PT "6 Clicks" Daily Activity  Outcome Measure Difficulty turning over in bed (including adjusting bedclothes, sheets and blankets)?: A Lot Difficulty moving from lying on back to sitting on the side of the bed? : Unable Difficulty sitting down on and standing up from a chair with arms (e.g., wheelchair, bedside commode, etc,.)?: A Lot Help needed moving to and from a bed to chair (including a wheelchair)?: A Little Help needed walking in hospital room?: A Little Help needed climbing 3-5 steps with a railing? : A Lot 6 Click Score: 13    End of Session Equipment Utilized During Treatment: Gait belt Activity Tolerance: Patient tolerated treatment well Patient left: in bed;with call bell/phone within reach;with family/visitor present Nurse Communication: Mobility status PT Visit Diagnosis: Muscle weakness (generalized) (M62.81);Difficulty in walking, not elsewhere classified (R26.2);Other abnormalities of gait and mobility (R26.89)    Time: 1107-1130 PT Time Calculation (min) (ACUTE ONLY): 23 min   Charges:   PT Evaluation $PT Eval Moderate Complexity: 1 Mod PT Treatments $Gait Training: 8-22 mins        Lorrin Goodell, PT  Office # [phone] Pager [phone]   Lorriane Shire 02/22/2018, 12:44 PM

## 2018-02-22 NOTE — Evaluation (Signed)
Clinical/Bedside Swallow Evaluation Patient Details  Name: Lori Butler MRN: [id number] Date of Birth: 07-Oct-1950  Today's Date: 02/22/2018 Time: SLP Start Time (ACUTE ONLY): 2683 SLP Stop Time (ACUTE ONLY): 1209 SLP Time Calculation (min) (ACUTE ONLY): 10 min  Past Medical History:  Past Medical History:  Diagnosis Date  . Acute respiratory failure with hypoxia ([place])   . Anxiety   . Aortic atherosclerosis ([place])   . Arthritis    "a little in my hands probably" (10/31/2017)  . COPD (chronic obstructive pulmonary disease) ([place])   . COPD exacerbation ([place])   . Depression   . History of kidney stones   . Suicide attempt ([place]) 02/21/2018   overdosed on Wellbutrin   Past Surgical History:  Past Surgical History:  Procedure Laterality Date  . ABDOMINAL HYSTERECTOMY    . APPENDECTOMY    . BREAST BIOPSY Left 1990s  . CATARACT EXTRACTION W/ INTRAOCULAR LENS  IMPLANT, BILATERAL Bilateral   . DILATION AND CURETTAGE OF UTERUS    . TONSILLECTOMY    . TUBAL LIGATION     HPI:  67 y.o. female, with history of COPD, mild depression, osteoporosis, GERD who lives at home by herself, has been having issues with some generalized weakness and deconditioning since her last admission to the hospital few months ago for COPD exacerbation.  Admitted for Wellbutrin overdose, leukocytosis, severe dehydration.    Assessment / Plan / Recommendation Clinical Impression  Pt presents with normal oropharyngeal swallow marked by adequate mastication, brisk swallow response, no s/s of aspiration.  Continue current diet, thin liquids, meds whole in liquid.  No SLP f/u.  SLP Visit Diagnosis: Dysphagia, unspecified (R13.10)    Aspiration Risk  No limitations    Diet Recommendation   regular solids, thin liquids  Medication Administration: Whole meds with liquid    Other  Recommendations Oral Care Recommendations: Oral care BID   Follow up Recommendations None      Frequency and Duration             Prognosis        Swallow Study   General HPI: 67 y.o. female, with history of COPD, mild depression, osteoporosis, GERD who lives at home by herself, has been having issues with some generalized weakness and deconditioning since her last admission to the hospital few months ago for COPD exacerbation.  Admitted for Wellbutrin overdose, leukocytosis, severe dehydration.  Type of Study: Bedside Swallow Evaluation Diet Prior to this Study: Dysphagia 3 (soft);Thin liquids Temperature Spikes Noted: No Respiratory Status: Room air History of Recent Intubation: No Behavior/Cognition: Alert;Cooperative;Pleasant mood Oral Cavity Assessment: Within Functional Limits Oral Care Completed by SLP: No Oral Cavity - Dentition: Adequate natural dentition Vision: Functional for self-feeding Self-Feeding Abilities: Able to feed self Patient Positioning: Upright in bed Baseline Vocal Quality: Normal Volitional Cough: Strong Volitional Swallow: Able to elicit    Oral/Motor/Sensory Function     Ice Chips Ice chips: Within functional limits   Thin Liquid Thin Liquid: Within functional limits    Nectar Thick Nectar Thick Liquid: Not tested   Honey Thick Honey Thick Liquid: Not tested   Puree Puree: Within functional limits   Solid     Solid: Within functional limits      Juan Quam Laurice 02/22/2018,12:22 PM  Estill Bamberg L. Tivis Ringer, [place] Office number [phone] Pager [phone]

## 2018-02-22 NOTE — Progress Notes (Signed)
Toxicology physician called from poison control. Voiced recommendations for patient care regarding metoprolol, EKG and foley. Left follow-up phone number if needed at [facility] [phone]. Dorthey Sawyer, RN

## 2018-02-22 NOTE — Clinical Social Work Note (Signed)
CSW was advised about patient during morning progression and that psychiatry eval was pending. CSW reviewed note later this afternoon and inpatient psych recommended once patient medically cleared. CSW will continue to follow and initiate psychiatric facility search once medically cleared.  Shelby Anderle Givens, MSW, LCSW Licensed Clinical Social Worker [place] [phone]

## 2018-02-22 NOTE — Patient Outreach (Signed)
[facility]) Care Management  02/22/2018  Lori Butler May 01, 1951 [id number]   [place] Hospitalization  Referral Date:12/25/2017 Referral Source:Hospital Liaison Reason for Referral:Disease Management Education Insurance:[employer]   Outreach Attempt:  Received notification patient admitted to [facility] for intentional overdose.  Awaiting Psychiatric Consult.  RN [facility] of patient's hospital admission.  Plan:  Will await recommendations of discharge plan from [place].  [place] [phone] [name]

## 2018-02-22 NOTE — Consult Note (Signed)
   [facility] Inpatient Consult   02/22/2018  [name] October 19, 1950 [id number] Patient is currently active with [facility] for chronic disease management services.  Patient has been engaged by a [employer], Software engineer and CSW.  Chart review reveals the patient is [per MD notes]:  Lori Butler is a 67 y.o. female with a history of COPD, former tobacco use, GERD, and depression who presented to the ED with weakness found down at home alone after intentional overdose with wellbutrin (40 tablets of 150mg ) in suicide attempt. She reported stopping smoking in May and having terrible health ever since including hospitalizations for COPD exacerbation and progressive generalized weakness and deconditioning causing her to miss her part time work at [employer] which has caused financial stress. She was found by her family on her couch confused and weak, having sat there for 24-48 hours with limited memory of events following overdose.  Patient is in consultation with Psychiatry. Will follow progress and needs.   Of note, [facility] Care Management services does not replace or interfere with any services that are needed or arranged by inpatient case management or social work.  For additional questions or referrals please contact:  Natividad Brood, RN BSN [place] [facility] Liaison  [phone] business mobile phone Toll free office [phone]

## 2018-02-22 NOTE — Progress Notes (Signed)
PROGRESS NOTE  Geneieve Duell  ZYS:[id number] DOB: [birth year] DOA: 02/21/2018 PCP: [name], MD   Brief Narrative: Courtland Coppa is a 67 y.o. female with a history of COPD, former tobacco use, GERD, and depression who presented to the ED with weakness found down at home alone after intentional overdose with wellbutrin (40 tablets of 150mg ) in suicide attempt. She reported stopping smoking in May and having terrible health ever since including hospitalizations for COPD exacerbation and progressive generalized weakness and deconditioning causing her to miss her part time work at [employer] which has caused financial stress. She was found by her family on her couch confused and weak, having sat there for 24-48 hours with limited memory of events following overdose. In the ED the patient appeared dehydrated, tachycardic with mild leukocytosis and negative infectious work up. CK noted to be 2249 and patient noted to have 555cc retained urine. QTc 451msec. Poison control was consulted, foley placed, IV fluids given and she was admitted for monitoring, psychiatry evaluation, and treatment of rhabdomyolysis.  Assessment & Plan: Principal Problem:   Attempted suicide [facility]) Active Problems:   COPD (chronic obstructive pulmonary disease) ([place])   Tobacco abuse   Physical deconditioning  Suicide attempt by overdose of wellbutrin:- Psychiatry consulted - Continue sitter.  - Not current endorsing ongoing SI, feels act was impulsive.  Wellbutrin toxicity due to overdose: 150mg  24hr formulation x40 tabs per patient. Pt has tremors but no seizure like activity.  - Neuro checks - Monitor QRS and QT intervals (currently remaining wnl) - Continue metoprolol for sinus tachycardia - Anticipate prolonged effects with extended release formulation and amount of overdose (6g).  Rhabdomyolysis: Nontraumatic.  - Improved with IVF which we will continue for 24 more hours to aid urinary excretion of CK and drug.    Generalized weakness: Not hypotensive or otherwise symptomatic to suggest adrenal insufficiency. - PT/OT  Sinus tachycardia: Stable. TSH 1.7. Due to wellbutrin. - Continue beta blocker and hydration.  COPD: No respiratory symptoms or evidence of flare. Followed in [facility]. PFTs 10/2017-moderate airway obstruction, ratio 59, FEV1 51%, FVC 65%, no significant BD response, DLCO 57% - Continue home medications  GERD:  - Continue PPI  Leukocytosis: Suspected stress demargination in the absence of fever, localizing infectious symptoms. Also possibly related to prednisone prescribed by pulm earlier this month. - Monitor off antibiotics.  DVT prophylaxis: Heparin Code Status: Full Family Communication: None at bedside Disposition Plan: Uncertain, pending improvement in symptoms attributable to overdose and psychiatry evaluation.  Consultants:   Psychiatry  Poison control  Procedures:   None  Antimicrobials:  None   Subjective: Remorseful that she overdosed, tearful, discusses the significant stressors in her life. Feels very jittery, tremulous, uneasy. No nausea, vomiting, diarrhea abd pain, chest pain, dyspnea or wheezing.  Objective: Vitals:   02/21/18 2216 02/21/18 2305 02/22/18 0551 02/22/18 0754  BP: 124/62  126/70 123/70  Pulse:   94 93  Resp: 16  18 18   Temp: 98.4 F (36.9 C)  98.2 F (36.8 C) 98.2 F (36.8 C)  TempSrc: Oral  Oral Oral  SpO2: 94% 92% 96% 93%   Gen: 67 y.o. tremulous female Pulm: Non-labored breathing. Clear to auscultation bilaterally.  CV: Regular tachycardia. No murmur, rub, or gallop. No JVD, no pedal edema. GI: Abdomen soft, non-tender, non-distended, with normoactive bowel sounds. No organomegaly or masses felt. Ext: Warm, no deformities Skin: No rashes, lesions or ulcers Neuro: Alert and oriented. No focal neurological deficits. Psych: Judgement and insight appear  intact. Mood depressed, fair eye contact, tearful, no AVH,  SI, HI.   Data Reviewed: I have personally reviewed following labs and imaging studies  CBC: Recent Labs  Lab 02/21/18 1404 02/21/18 1729 02/22/18 0543  WBC 21.9* 22.4* 16.1*  NEUTROABS 19.1*  --   --   HGB 13.5 12.4 11.3*  HCT 44.0 39.8 36.5  MCV 93.0 93.0 93.4  PLT 327 304 024   Basic Metabolic Panel: Recent Labs  Lab 02/21/18 1404 02/21/18 1729 02/22/18 0543  NA 139  --  138  K 4.4  --  4.2  CL 107  --  110  CO2 21*  --  17*  GLUCOSE 72  --  55*  BUN 36*  --  24*  CREATININE 1.03* 0.87 0.70  CALCIUM 8.8*  --  7.7*   GFR: CrCl cannot be calculated (Unknown ideal weight.). Liver Function Tests: Recent Labs  Lab 02/21/18 1404 02/22/18 0543  AST 35 48*  ALT 23 24  ALKPHOS 97 72  BILITOT 0.7 0.6  PROT 7.5 5.5*  ALBUMIN 4.1 3.1*   No results for input(s): LIPASE, AMYLASE in the last 168 hours. No results for input(s): AMMONIA in the last 168 hours. Coagulation Profile: Recent Labs  Lab 02/21/18 1404 02/21/18 1729  INR 0.99 0.97   Cardiac Enzymes: Recent Labs  Lab 02/21/18 1729 02/22/18 0543  CKTOTAL 2,249* 1,745*   BNP (last 3 results) No results for input(s): PROBNP in the last 8760 hours. HbA1C: No results for input(s): HGBA1C in the last 72 hours. CBG: No results for input(s): GLUCAP in the last 168 hours. Lipid Profile: No results for input(s): CHOL, HDL, LDLCALC, TRIG, CHOLHDL, LDLDIRECT in the last 72 hours. Thyroid Function Tests: Recent Labs    02/21/18 1729  TSH 1.700   Anemia Panel: No results for input(s): VITAMINB12, FOLATE, FERRITIN, TIBC, IRON, RETICCTPCT in the last 72 hours. Urine analysis:    Component Value Date/Time   COLORURINE YELLOW 02/21/2018 1630   APPEARANCEUR CLEAR 02/21/2018 1630   LABSPEC 1.020 02/21/2018 1630   PHURINE 5.0 02/21/2018 1630   GLUCOSEU NEGATIVE 02/21/2018 1630   HGBUR NEGATIVE 02/21/2018 1630   BILIRUBINUR NEGATIVE 02/21/2018 1630   KETONESUR 20 (A) 02/21/2018 1630   PROTEINUR NEGATIVE  02/21/2018 1630   UROBILINOGEN 1.0 07/12/2012 1842   NITRITE NEGATIVE 02/21/2018 1630   LEUKOCYTESUR NEGATIVE 02/21/2018 1630   No results found for this or any previous visit (from the past 240 hour(s)).    Radiology Studies: Dg Chest 2 View  Result Date: 02/21/2018 CLINICAL DATA:  Chest pain and weakness. Acute respiratory failure, COPD, former smoker. Possible overdose. EXAM: CHEST - 2 VIEW COMPARISON:  Portable chest x-ray of December 25, 2017 FINDINGS: There is mild chronic elevation of the right hemidiaphragm. The lungs are adequately inflated. The interstitial markings are coarse. There is no alveolar infiltrate. There is no pleural effusion. The heart and pulmonary vascularity are normal. The mediastinum is normal in width. The bony thorax exhibits no acute abnormality. IMPRESSION: Mild chronic bronchitic changes, stable. No pneumonia, CHF, nor other acute cardiopulmonary abnormality. Electronically Signed   By: David  Martinique M.D.   On: 02/21/2018 14:38   Ct Head Wo Contrast  Result Date: 02/21/2018 CLINICAL DATA:  Altered mental status today. EXAM: CT HEAD WITHOUT CONTRAST TECHNIQUE: Contiguous axial images were obtained from the base of the skull through the vertex without intravenous contrast. COMPARISON:  Brain MRI 10/30/2017.  Head CT scan 10/29/2017. FINDINGS: Brain: No evidence of acute infarction,  hemorrhage, hydrocephalus, extra-axial collection or mass lesion/mass effect. Mild atrophy and chronic microvascular ischemic change noted. Vascular: Atherosclerosis is seen. Skull: Intact.  No focal lesion. Sinuses/Orbits: Negative. Other: None. IMPRESSION: No acute abnormality. Stable appearance of atrophy and chronic microvascular ischemic change. Atherosclerosis. Electronically Signed   By: Inge Rise M.D.   On: 02/21/2018 14:22    Scheduled Meds: . calcium carbonate  400 mg of elemental calcium Oral Once  . guaiFENesin  600 mg Oral BID  . heparin  5,000 Units Subcutaneous Q8H    . metoprolol tartrate  25 mg Oral BID  . pantoprazole  40 mg Oral BID  . umeclidinium bromide  1 puff Inhalation Daily   Continuous Infusions: . lactated ringers 125 mL/hr at 02/22/18 1122  . sodium chloride       LOS: 1 day   Time spent: 25 minutes.  Patrecia Pour, MD [facility] [email or web address] Password [email or web address] 02/22/2018, 1:59 PM

## 2018-02-22 NOTE — Consult Note (Addendum)
[place] Psychiatry Consult   Reason for Consult:  Suicide attempt Referring Physician:  Dr. Bonner Puna Patient Identification: Lori Butler MRN:  [id number] Principal Diagnosis: Attempted suicide [facility]) Diagnosis:   Patient Active Problem List   Diagnosis Date Noted  . Attempted suicide ([place]) [T14.91XA] 02/21/2018  . Hypoxia [R09.02]   . Physical deconditioning [R53.81]   . COPD exacerbation ([place]) [J44.1]   . Vitamin B12 deficiency [E53.8] 11/10/2017  . Osteoporosis [M81.0] 11/10/2017  . Vitamin D deficiency [E55.9] 11/10/2017  . Tobacco abuse [Z72.0] 09/20/2017  . COPD (chronic obstructive pulmonary disease) ([place]) [J44.9] 09/15/2017    Total Time spent with patient: 45 minutes  Subjective:   Lori Butler is a 67 y.o. female patient admitted after intentional overdose of Wellbutrin.  Patient reports today that she is still feeling very depressed and overwhelmed due to her situation at home have not been able to pay her bills.  She states that since she is started having medical issues she has not been able to work.  She states that she is not a been able to afford her medications that she is normally prescribed.  She reports that she is scared to discuss this with her family as she feels that she is more of a burden on them.  She reports that she has never been on any other medications other than Wellbutrin for depression and for smoking cessation.  She denies any previous hospitalizations she denies any previous visits to a psychiatrist or to a therapist.  She does admit that things are still bad at home and she is concern for herself about leaving and going back home.  She denies any suicidal thoughts, but she does admit to having concerns about what will happen when she becomes overwhelmed again.  Patient agrees to voluntarily be admitted to a psychiatric inpatient unit.  HPI:  67 y.o. female with a history of COPD, former tobacco use, GERD, and depression who presented to the ED  with weakness found down at home alone after intentional overdose with wellbutrin (40 tablets of 16m) in suicide attempt. She reported stopping smoking in May and having terrible health ever since including hospitalizations for COPD exacerbation and progressive generalized weakness and deconditioning causing her to miss her part time work at [employer] has caused financial stress. She was found by her family on her couch confused and weak, having sat there for 24-48 hours with limited memory of events following overdose. In the ED the patient appeared dehydrated, tachycardic with mild leukocytosis and negative infectious work up. CK noted to be 2249 and patient noted to have 555cc retained urine. QTc 468mc. Poison control was consulted, foley placed, IV fluids given and she was admitted for monitoring, psychiatry evaluation, and treatment of rhabdomyolysis.  Past Psychiatric History: Denies any hospitalizations, denies any previous suicide attempts.  Risk to Self:   Risk to Others:   Prior Inpatient Therapy:   Prior Outpatient Therapy:    Past Medical History:  Past Medical History:  Diagnosis Date  . Acute respiratory failure with hypoxia ([facility]  . Anxiety   . Aortic atherosclerosis ([facility]  . Arthritis    "a little in my hands probably" (10/31/2017)  . COPD (chronic obstructive pulmonary disease) ([facility]  . COPD exacerbation ([facility] [place]  . Depression   . History of kidney stones   . Suicide attempt ([facility] [place]   overdosed on Wellbutrin    Past Surgical History:  Procedure Laterality Date  . ABDOMINAL HYSTERECTOMY    .  APPENDECTOMY    . BREAST BIOPSY Left 1990s  . CATARACT EXTRACTION W/ INTRAOCULAR LENS  IMPLANT, BILATERAL Bilateral   . DILATION AND CURETTAGE OF UTERUS    . TONSILLECTOMY    . TUBAL LIGATION     Family History:  Family History  Problem Relation Age of Onset  . Breast cancer Mother    Family Psychiatric  History: Denies any family history of mental  health Social History:  Social History   Substance and Sexual Activity  Alcohol Use Not Currently   Comment: 02/21/2018 "couple drinks/year"     Social History   Substance and Sexual Activity  Drug Use Never    Social History   Socioeconomic History  . Marital status: Divorced    Spouse name: Not on file  . Number of children: Not on file  . Years of education: Not on file  . Highest education level: Not on file  Occupational History  . Not on file  Social Needs  . Financial resource strain: Somewhat hard  . Food insecurity:    Worry: Not on file    Inability: Not on file  . Transportation needs:    Medical: Not on file    Non-medical: Not on file  Tobacco Use  . Smoking status: Former Smoker    Packs/day: 1.50    Years: 51.00    Pack years: 76.50    Types: Cigarettes    Last attempt to quit: 09/05/2017    Years since quitting: 0.4  . Smokeless tobacco: Never Used  Substance and Sexual Activity  . Alcohol use: Not Currently    Comment: 02/21/2018 "couple drinks/year"  . Drug use: Never  . Sexual activity: Not Currently  Lifestyle  . Physical activity:    Days per week: Not on file    Minutes per session: Not on file  . Stress: Not on file  Relationships  . Social connections:    Talks on phone: Not on file    Gets together: Not on file    Attends religious service: Not on file    Active member of club or organization: Not on file    Attends meetings of clubs or organizations: Not on file    Relationship status: Not on file  Other Topics Concern  . Not on file  Social History Narrative  . Not on file   Additional Social History:    Allergies:   Allergies  Allergen Reactions  . Demerol [Meperidine] Other (See Comments)    "horrible reaction"  . Oxycodone-Acetaminophen Other (See Comments)    Upset stomach   . Tramadol Nausea And Vomiting    Labs:  Results for orders placed or performed during the hospital encounter of 02/21/18 (from the past 48  hour(s))  Ethanol     Status: None   Collection Time: 02/21/18  2:04 PM  Result Value Ref Range   Alcohol, Ethyl (B) <10 <10 mg/dL    Comment: (NOTE) Lowest detectable limit for serum alcohol is 10 mg/dL. For medical purposes only. Performed at [facility], [place] [address] [place], [place] [address]   Protime-INR     Status: None   Collection Time: 02/21/18  2:04 PM  Result Value Ref Range   Prothrombin Time 13.0 11.4 - 15.2 seconds   INR 0.99     Comment: Performed at [facility], [place] [address] [place], [place] [address]  APTT     Status: None   Collection Time: 02/21/18  2:04  PM  Result Value Ref Range   aPTT 28 24 - 36 seconds    Comment: Performed at [facility], [place] [address] [place], [place] [address]  CBC     Status: Abnormal   Collection Time: 02/21/18  2:04 PM  Result Value Ref Range   WBC 21.9 (H) 4.0 - 10.5 K/uL   RBC 4.73 3.87 - 5.11 MIL/uL   Hemoglobin 13.5 12.0 - 15.0 g/dL   HCT 44.0 36.0 - 46.0 %   MCV 93.0 80.0 - 100.0 fL   MCH 28.5 26.0 - 34.0 pg   MCHC 30.7 30.0 - 36.0 g/dL   RDW 13.2 11.5 - 15.5 %   Platelets 327 150 - 400 K/uL   nRBC 0.0 0.0 - 0.2 %    Comment: Performed at [facility], [place] [address], [place], [place] [address]  Differential     Status: Abnormal   Collection Time: 02/21/18  2:04 PM  Result Value Ref Range   Neutrophils Relative % 87 %   Neutro Abs 19.1 (H) 1.7 - 7.7 K/uL   Lymphocytes Relative 5 %   Lymphs Abs 1.1 0.7 - 4.0 K/uL   Monocytes Relative 7 %   Monocytes Absolute 1.5 (H) 0.1 - 1.0 K/uL   Eosinophils Relative 0 %   Eosinophils Absolute 0.0 0.0 - 0.5 K/uL   Basophils Relative 0 %   Basophils Absolute 0.0 0.0 - 0.1 K/uL   Immature Granulocytes 1 %   Abs Immature Granulocytes 0.12 (H) 0.00 - 0.07 K/uL    Comment: Performed at [place] [address] [place], [place] [address]  Comprehensive metabolic panel     Status: Abnormal   Collection Time: 02/21/18  2:04 PM  Result  Value Ref Range   Sodium 139 135 - 145 mmol/L   Potassium 4.4 3.5 - 5.1 mmol/L   Chloride 107 98 - 111 mmol/L   CO2 21 (L) 22 - 32 mmol/L   Glucose, Bld 72 70 - 99 mg/dL   BUN 36 (H) 8 - 23 mg/dL   Creatinine, Ser 1.03 (H) 0.44 - 1.00 mg/dL   Calcium 8.8 (L) 8.9 - 10.3 mg/dL   Total Protein 7.5 6.5 - 8.1 g/dL   Albumin 4.1 3.5 - 5.0 g/dL   AST 35 15 - 41 U/L   ALT 23 0 - 44 U/L   Alkaline Phosphatase 97 38 - 126 U/L   Total Bilirubin 0.7 0.3 - 1.2 mg/dL   GFR calc non Af Amer 55 (L) >60 mL/min   GFR calc Af Amer >60 >60 mL/min    Comment: (NOTE) The eGFR has been calculated using the CKD EPI equation. This calculation has not been validated in all clinical situations. eGFR's persistently <60 mL/min signify possible Chronic Kidney Disease.    Anion gap 11 5 - 15    Comment: Performed at [place] [address] [place], [place] [address]  Acetaminophen level     Status: Abnormal   Collection Time: 02/21/18  2:04 PM  Result Value Ref Range   Acetaminophen (Tylenol), Serum <10 (L) 10 - 30 ug/mL    Comment: (NOTE) Therapeutic concentrations vary significantly. A range of 10-30 ug/mL  may be an effective concentration for many patients. However, some  are best treated at concentrations outside of this range. Acetaminophen concentrations >150 ug/mL at 4 hours after ingestion  and >50 ug/mL at 12 hours after ingestion are often associated with  toxic  reactions. Performed at [facility], [place] [address], [place], [place] [address]   Salicylate level     Status: None   Collection Time: 02/21/18  2:04 PM  Result Value Ref Range   Salicylate Lvl <4.6 2.8 - 30.0 mg/dL    Comment: Performed at [place] [address] [place], [place] [address]  I-stat troponin, ED (not at [facility])     Status: None   Collection Time: 02/21/18  2:13 PM  Result Value Ref Range   Troponin i, poc 0.01 0.00 - 0.08 ng/mL   Comment 3            Comment: Due to the release  kinetics of cTnI, a negative result within the first hours of the onset of symptoms does not rule out myocardial infarction with certainty. If myocardial infarction is still suspected, repeat the test at appropriate intervals.   I-Stat venous blood gas, ED     Status: Abnormal   Collection Time: 02/21/18  2:14 PM  Result Value Ref Range   pH, Ven 7.330 7.250 - 7.430   pCO2, Ven 39.2 (L) 44.0 - 60.0 mmHg   pO2, Ven 39.0 32.0 - 45.0 mmHg   Bicarbonate 20.6 20.0 - 28.0 mmol/L   TCO2 22 22 - 32 mmol/L   O2 Saturation 70.0 %   Acid-base deficit 5.0 (H) 0.0 - 2.0 mmol/L   Patient temperature HIDE    Sample type VENOUS    Comment NOTIFIED PHYSICIAN   Urine rapid drug screen (hosp performed)not at [facility]     Status: None   Collection Time: 02/21/18  4:30 PM  Result Value Ref Range   Opiates NONE DETECTED NONE DETECTED   Cocaine NONE DETECTED NONE DETECTED   Benzodiazepines NONE DETECTED NONE DETECTED   Amphetamines NONE DETECTED NONE DETECTED   Tetrahydrocannabinol NONE DETECTED NONE DETECTED   Barbiturates NONE DETECTED NONE DETECTED    Comment: (NOTE) DRUG SCREEN FOR MEDICAL PURPOSES ONLY.  IF CONFIRMATION IS NEEDED FOR ANY PURPOSE, NOTIFY LAB WITHIN 5 DAYS. LOWEST DETECTABLE LIMITS FOR URINE DRUG SCREEN Drug Class                     Cutoff (ng/mL) Amphetamine and metabolites    1000 Barbiturate and metabolites    200 Benzodiazepine                 751 Tricyclics and metabolites     300 Opiates and metabolites        300 Cocaine and metabolites        300 THC                            50 Performed at [facility], [place] [address], [place], [place] [address]   Urinalysis, Routine w reflex microscopic (not at [facility])     Status: Abnormal   Collection Time: 02/21/18  4:30 PM  Result Value Ref Range   Color, Urine YELLOW YELLOW   APPearance CLEAR CLEAR   Specific Gravity, Urine 1.020 1.005 - 1.030   pH 5.0 5.0 - 8.0   Glucose, UA NEGATIVE NEGATIVE mg/dL   Hgb urine  dipstick NEGATIVE NEGATIVE   Bilirubin Urine NEGATIVE NEGATIVE   Ketones, ur 20 (A) NEGATIVE mg/dL   Protein, ur NEGATIVE NEGATIVE mg/dL   Nitrite NEGATIVE NEGATIVE   Leukocytes, UA NEGATIVE NEGATIVE    Comment: Performed at [facility], [address] [place], [place] [address]  CK     Status: Abnormal   Collection Time: 02/21/18  5:29 PM  Result Value Ref Range   Total CK 2,249 (H) 38 - 234 U/L    Comment: Performed at [facility], [place] [address], [place], [place] [address]  CBC     Status: Abnormal   Collection Time: 02/21/18  5:29 PM  Result Value Ref Range   WBC 22.4 (H) 4.0 - 10.5 K/uL   RBC 4.28 3.87 - 5.11 MIL/uL   Hemoglobin 12.4 12.0 - 15.0 g/dL   HCT 39.8 36.0 - 46.0 %   MCV 93.0 80.0 - 100.0 fL   MCH 29.0 26.0 - 34.0 pg   MCHC 31.2 30.0 - 36.0 g/dL   RDW 13.3 11.5 - 15.5 %   Platelets 304 150 - 400 K/uL   nRBC 0.0 0.0 - 0.2 %    Comment: Performed at [facility], [place] [address], [place], [place] [address]  Creatinine, serum     Status: None   Collection Time: 02/21/18  5:29 PM  Result Value Ref Range   Creatinine, Ser 0.87 0.44 - 1.00 mg/dL   GFR calc non Af Amer >60 >60 mL/min   GFR calc Af Amer >60 >60 mL/min    Comment: (NOTE) The eGFR has been calculated using the CKD EPI equation. This calculation has not been validated in all clinical situations. eGFR's persistently <60 mL/min signify possible Chronic Kidney Disease. Performed at [facility], [place] [address] [place], [place] [address]   Protime-INR     Status: None   Collection Time: 02/21/18  5:29 PM  Result Value Ref Range   Prothrombin Time 12.8 11.4 - 15.2 seconds   INR 0.97     Comment: Performed at [place] [address] [place], [place] [address]  TSH     Status: None   Collection Time: 02/21/18  5:29 PM  Result Value Ref Range   TSH 1.700 0.350 - 4.500 uIU/mL    Comment: Performed by a 3rd Generation assay with a functional sensitivity of <=0.01  uIU/mL. Performed at [facility], [place] [address] [place],  [address]   Comprehensive metabolic panel     Status: Abnormal   Collection Time: 02/22/18  5:43 AM  Result Value Ref Range   Sodium 138 135 - 145 mmol/L   Potassium 4.2 3.5 - 5.1 mmol/L   Chloride 110 98 - 111 mmol/L   CO2 17 (L) 22 - 32 mmol/L   Glucose, Bld 55 (L) 70 - 99 mg/dL   BUN 24 (H) 8 - 23 mg/dL   Creatinine, Ser 0.70 0.44 - 1.00 mg/dL   Calcium 7.7 (L) 8.9 - 10.3 mg/dL   Total Protein 5.5 (L) 6.5 - 8.1 g/dL   Albumin 3.1 (L) 3.5 - 5.0 g/dL   AST 48 (H) 15 - 41 U/L   ALT 24 0 - 44 U/L   Alkaline Phosphatase 72 38 - 126 U/L   Total Bilirubin 0.6 0.3 - 1.2 mg/dL   GFR calc non Af Amer >60 >60 mL/min   GFR calc Af Amer >60 >60 mL/min    Comment: (NOTE) The eGFR has been calculated using the CKD EPI equation. This calculation has not been validated in all clinical situations. eGFR's persistently <60 mL/min signify possible Chronic Kidney Disease.    Anion gap 11 5 - 15    Comment: Performed  at [facility], [place] [address], [place], [place] [address]  CBC     Status: Abnormal   Collection Time: 02/22/18  5:43 AM  Result Value Ref Range   WBC 16.1 (H) 4.0 - 10.5 K/uL   RBC 3.91 3.87 - 5.11 MIL/uL   Hemoglobin 11.3 (L) 12.0 - 15.0 g/dL   HCT 36.5 36.0 - 46.0 %   MCV 93.4 80.0 - 100.0 fL   MCH 28.9 26.0 - 34.0 pg   MCHC 31.0 30.0 - 36.0 g/dL   RDW 13.6 11.5 - 15.5 %   Platelets 258 150 - 400 K/uL   nRBC 0.0 0.0 - 0.2 %    Comment: Performed at [facility], [place] [address] [place] [address], [place], [place] [address]  Salicylate level     Status: None   Collection Time: 02/22/18  5:43 AM  Result Value Ref Range   Salicylate Lvl <7.3 2.8 - 30.0 mg/dL    Comment: Performed at [place] [address] [place], [place] [address]  CK     Status: Abnormal   Collection Time: 02/22/18  5:43 AM  Result Value Ref Range   Total CK 1,745 (H) 38 - 234 U/L    Comment: Performed at [facility], [place] [address], [place], [place] [address]    Current Facility-Administered Medications  Medication Dose Route Frequency Provider Last Rate Last Dose  . acetaminophen (TYLENOL) tablet 650 mg  650 mg Oral Q6H PRN Thurnell Lose, MD       Or  . acetaminophen (TYLENOL) suppository 650 mg  650 mg Rectal Q6H PRN Thurnell Lose, MD      . albuterol (PROVENTIL) (2.5 MG/3ML) 0.083% nebulizer solution 2.5 mg  2.5 mg Nebulization Q4H PRN Thurnell Lose, MD      . bisacodyl (DULCOLAX) EC tablet 5 mg  5 mg Oral Daily PRN Thurnell Lose, MD      . calcium carbonate (TUMS - dosed in mg elemental calcium) chewable tablet 400 mg of elemental calcium  400 mg of elemental calcium Oral Once Thurnell Lose, MD      . guaiFENesin (MUCINEX) 12 hr tablet 600 mg  600 mg Oral BID Thurnell Lose, MD   600 mg at 02/22/18 0949  . heparin injection 5,000 Units  5,000 Units Subcutaneous Q8H Thurnell Lose, MD   5,000 Units at 02/22/18 1459  . lactated ringers infusion   Intravenous Continuous Patrecia Pour, MD 125 mL/hr at 02/22/18 1122    . metoprolol tartrate (LOPRESSOR) tablet 25 mg  25 mg Oral BID Thurnell Lose, MD   25 mg at 02/22/18 0949  . pantoprazole (PROTONIX) EC tablet 40 mg  40 mg Oral BID Thurnell Lose, MD   40 mg at 02/22/18 0949  . sodium chloride 0.9 % bolus 500 mL  500 mL Intravenous Once Thurnell Lose, MD      . umeclidinium bromide (INCRUSE ELLIPTA) 62.5 MCG/INH 1 puff  1 puff Inhalation Daily Thurnell Lose, MD   1 puff at 02/21/18 2305    Musculoskeletal: Strength & Muscle Tone: within normal limits Gait & Station: Patient remained in bed uring assessment Patient leans: N/A  Psychiatric Specialty Exam: Physical Exam  Nursing note and vitals reviewed. Constitutional: She is oriented to person, place, and time. She appears well-developed and well-nourished.  Cardiovascular: Normal rate.  Respiratory: Effort normal.  Musculoskeletal: Normal range of  motion.  Neurological:  She is alert and oriented to person, place, and time.  Skin: Skin is warm.    Review of Systems  Constitutional: Negative.   HENT: Negative.   Eyes: Negative.   Respiratory: Negative.   Cardiovascular: Negative.   Gastrointestinal: Negative.   Genitourinary: Negative.   Musculoskeletal: Negative.   Skin: Negative.   Neurological: Negative.   Endo/Heme/Allergies: Negative.   Psychiatric/Behavioral: Positive for depression and suicidal ideas. Negative for hallucinations and substance abuse. The patient is nervous/anxious.     Blood pressure 123/70, pulse 93, temperature 98.2 F (36.8 C), temperature source Oral, resp. rate 18, SpO2 93 %.There is no height or weight on file to calculate BMI.  General Appearance: Casual  Eye Contact:  Fair  Speech:  Clear and Coherent and Normal Rate  Volume:  Decreased  Mood:  Depressed  Affect:  Depressed and Tearful  Thought Process:  Linear and Descriptions of Associations: Intact  Orientation:  Full (Time, Place, and Person)  Thought Content:  WDL  Suicidal Thoughts:  None currently, but fearful about becoming overwhelmed once discharged  Homicidal Thoughts:  No  Memory:  Immediate;   Good Recent;   Good  Judgement:  Fair  Insight:  Fair  Psychomotor Activity:  Normal  Concentration:  Concentration: Good and Attention Span: Good  Recall:  Good  Fund of Knowledge:  Good  Language:  Good  Akathisia:  No  Handed:  Right  AIMS (if indicated):     Assets:  Communication Skills Desire for Improvement Housing Resilience Social Support  ADL's:  Intact  Cognition:  WNL  Sleep:      More than 50% of this assessment was spent educating and discussing treatment options for patient   Treatment Plan Summary: Once medically cleared seek inpateint psychiatric treatment  Patient agrees to voluntary psychiatric inpatient treatment  Disposition: Recommend psychiatric Inpatient admission when medically cleared.  [place], FNP 02/22/2018 3:35 PM   Patient seen by NP, case reviewed

## 2018-02-23 LAB — BASIC METABOLIC PANEL
Anion gap: 7 (ref 5–15)
BUN: 14 mg/dL (ref 8–23)
CHLORIDE: 108 mmol/L (ref 98–111)
CO2: 22 mmol/L (ref 22–32)
Calcium: 7.6 mg/dL — ABNORMAL LOW (ref 8.9–10.3)
Creatinine, Ser: 0.68 mg/dL (ref 0.44–1.00)
GFR calc Af Amer: >60 mL/min (ref >60)
GFR calc non Af Amer: >60 mL/min (ref >60)
GLUCOSE: 77 mg/dL (ref 70–99)
POTASSIUM: 4.1 mmol/L (ref 3.5–5.1)
SODIUM: 137 mmol/L (ref 135–145)

## 2018-02-23 LAB — CBC
HEMATOCRIT: 34.3 % — AB (ref 36.0–46.0)
HEMOGLOBIN: 11.1 g/dL — AB (ref 12.0–15.0)
MCH: 29.7 pg (ref 26.0–34.0)
MCHC: 32.4 g/dL (ref 30.0–36.0)
MCV: 91.7 fL (ref 80.0–100.0)
PLATELETS: 224 10*3/uL (ref 150–400)
RBC: 3.74 MIL/uL — ABNORMAL LOW (ref 3.87–5.11)
RDW: 13.3 % (ref 11.5–15.5)
WBC: 11.5 10*3/uL — AB (ref 4.0–10.5)
nRBC: 0 % (ref 0.0–0.2)

## 2018-02-23 LAB — CK: Total CK: 709 U/L — ABNORMAL HIGH (ref 38–234)

## 2018-02-23 NOTE — Progress Notes (Signed)
PROGRESS NOTE  Lori Butler  ZSW:[id number] DOB: Apr 29, 1951 DOA: 02/21/2018 PCP: [name], MD   Brief Narrative: Lori Butler is a 67 y.o. female with a history of COPD, former tobacco use, GERD, and depression who presented to the ED with weakness found down at home alone after intentional overdose with wellbutrin (40 tablets of 150mg ) in suicide attempt. She reported stopping smoking in May and having terrible health ever since including hospitalizations for COPD exacerbation and progressive generalized weakness and deconditioning causing her to miss her part time work at [employer] which has caused financial stress. She was found by her family on her couch confused and weak, having sat there for 24-48 hours with limited memory of events following overdose. In the ED the patient appeared dehydrated, tachycardic with mild leukocytosis and negative infectious work up. CK noted to be 2249 and patient noted to have 555cc retained urine. QTc 473msec. Poison control was consulted, foley placed, IV fluids given and she was admitted for monitoring, psychiatry evaluation, and treatment of rhabdomyolysis. CK, leukocytosis, and tremors have improved. Psychiatry has recommended brief inpatient psychiatric hospitalization when medically stable.  Assessment & Plan: Principal Problem:   Attempted suicide [facility]) Active Problems:   COPD (chronic obstructive pulmonary disease) ([place])   Tobacco abuse   Physical deconditioning  Suicide attempt by overdose of wellbutrin: - Psychiatry evaluated patient 10/18 recommending transfer to psychiatric inpatient for treatment. Patient initially agreed on voluntary basis and has reconsidered, will continue to discuss this as I believe it would benefit her greatly. - Landscape architect.   Wellbutrin toxicity due to overdose: 150mg  24hr formulation x40 tabs per patient. Pt had tremors but no seizure like activity. Anticipate prolonged effects with extended release formulation  and amount of overdose (6g), though improving as expected. - Monitor QRS and QT intervals (currently remaining wnl) - Can stop metoprolol with NSR maintained and low-normal BP.  Acute urinary retention: As result of OD. Now having diminution of other symptoms.  - Will DC foley in voiding trial.   Rhabdomyolysis: Nontraumatic, improved. CK 2249 >> 709. - Can DC IVF  Dysphagia: Cleared by SLP.  Generalized weakness: Not hypotensive or otherwise symptomatic to suggest adrenal insufficiency. - PT/OT - Would benefit from ambulation now that muscle strength in legs improving. Would benefit from repeat PT evaluation tomorrow once further bleed out of wellbutrin/improved weakness.  Sinus tachycardia: Stable. TSH 1.7. Due to wellbutrin. - Monitor off BB  COPD: No respiratory symptoms or evidence of flare. Followed in [place] clinic. PFTs 10/2017-moderate airway obstruction, ratio 59, FEV1 51%, FVC 65%, no significant BD response, DLCO 57% - Continue home medications  GERD:  - Continue PPI  Leukocytosis: Suspected stress demargination in the absence of fever, localizing infectious symptoms. Also possibly related to prednisone prescribed by pulm earlier this month. - Improving without Tx. Monitor off antibiotics.  DVT prophylaxis: Heparin Code Status: Full Family Communication: None at bedside Disposition Plan: Uncertain due to evolving improvements in weakness (SNF initially recommended) and consideration of psychiatry's recommendations for voluntary commitment to inpatient psychiatric treatment.   Consultants:   Psychiatry  Poison control  Procedures:   None  Antimicrobials:  None   Subjective: Feels better physically today, got some rest, tremors are improved but still present. Has not been getting up but can tell her legs are less weak. No dyspnea. Had some difficulty swallowing this morning but this has improved.  Objective: Vitals:   02/22/18 1724 02/23/18 0006  02/23/18 0608 02/23/18 0926  BP: 117/60 121/64 Marland Kitchen)  101/49 117/67  Pulse: 88 87 76 79  Resp: 18 19 18 18   Temp: 98.2 F (36.8 C) 98.2 F (36.8 C) 98.7 F (37.1 C) 98.1 F (36.7 C)  TempSrc: Oral Oral Oral Oral  SpO2: 94% 97% 97% 96%   Gen: 67 y.o. female in no distress Pulm: Nonlabored breathing room air. Clear. CV: Regular rate and rhythm. No murmur, rub, or gallop. No JVD, no dependent edema. GI: Abdomen soft, non-tender, non-distended, with normoactive bowel sounds.  Ext: Warm, no deformities Skin: No rashes, lesions or ulcers on visualized skin.  Neuro: Alert and oriented. Subtle tremor in arms/hands. Legs with 5/5 strength and sensation intact. No focal neurological deficits. Psych: Judgement and insight appear fair. Mood depressed with broad affect. No AVH, SI, HI.   Data Reviewed: I have personally reviewed following labs and imaging studies  CBC: Recent Labs  Lab 02/21/18 1404 02/21/18 1729 02/22/18 0543 02/23/18 0438  WBC 21.9* 22.4* 16.1* 11.5*  NEUTROABS 19.1*  --   --   --   HGB 13.5 12.4 11.3* 11.1*  HCT 44.0 39.8 36.5 34.3*  MCV 93.0 93.0 93.4 91.7  PLT 327 304 258 660   Basic Metabolic Panel: Recent Labs  Lab 02/21/18 1404 02/21/18 1729 02/22/18 0543 02/23/18 0438  NA 139  --  138 137  K 4.4  --  4.2 4.1  CL 107  --  110 108  CO2 21*  --  17* 22  GLUCOSE 72  --  55* 77  BUN 36*  --  24* 14  CREATININE 1.03* 0.87 0.70 0.68  CALCIUM 8.8*  --  7.7* 7.6*   GFR: CrCl cannot be calculated (Unknown ideal weight.). Liver Function Tests: Recent Labs  Lab 02/21/18 1404 02/22/18 0543  AST 35 48*  ALT 23 24  ALKPHOS 97 72  BILITOT 0.7 0.6  PROT 7.5 5.5*  ALBUMIN 4.1 3.1*   No results for input(s): LIPASE, AMYLASE in the last 168 hours. No results for input(s): AMMONIA in the last 168 hours. Coagulation Profile: Recent Labs  Lab 02/21/18 1404 02/21/18 1729  INR 0.99 0.97   Cardiac Enzymes: Recent Labs  Lab 02/21/18 1729 02/22/18 0543  02/23/18 0438  CKTOTAL 2,249* 1,745* 709*   BNP (last 3 results) No results for input(s): PROBNP in the last 8760 hours. HbA1C: No results for input(s): HGBA1C in the last 72 hours. CBG: No results for input(s): GLUCAP in the last 168 hours. Lipid Profile: No results for input(s): CHOL, HDL, LDLCALC, TRIG, CHOLHDL, LDLDIRECT in the last 72 hours. Thyroid Function Tests: Recent Labs    02/21/18 1729  TSH 1.700   Anemia Panel: No results for input(s): VITAMINB12, FOLATE, FERRITIN, TIBC, IRON, RETICCTPCT in the last 72 hours. Urine analysis:    Component Value Date/Time   COLORURINE YELLOW 02/21/2018 1630   APPEARANCEUR CLEAR 02/21/2018 1630   LABSPEC 1.020 02/21/2018 1630   PHURINE 5.0 02/21/2018 1630   GLUCOSEU NEGATIVE 02/21/2018 1630   HGBUR NEGATIVE 02/21/2018 1630   BILIRUBINUR NEGATIVE 02/21/2018 1630   KETONESUR 20 (A) 02/21/2018 1630   PROTEINUR NEGATIVE 02/21/2018 1630   UROBILINOGEN 1.0 07/12/2012 1842   NITRITE NEGATIVE 02/21/2018 1630   LEUKOCYTESUR NEGATIVE 02/21/2018 1630   No results found for this or any previous visit (from the past 240 hour(s)).    Radiology Studies: No results found.  Scheduled Meds: . calcium carbonate  400 mg of elemental calcium Oral Once  . guaiFENesin  600 mg Oral BID  . heparin  5,000 Units Subcutaneous Q8H  . metoprolol tartrate  25 mg Oral BID  . pantoprazole  40 mg Oral BID  . umeclidinium bromide  1 puff Inhalation Daily   Continuous Infusions: . sodium chloride       LOS: 2 days   Time spent: 25 minutes.  Patrecia Pour, MD [facility] [email or web address] Password [email or web address] 02/23/2018, 4:19 PM

## 2018-02-23 NOTE — Progress Notes (Signed)
  Speech Language Pathology Patient Details Name: Shavone Nevers MRN: [id number] DOB: 04-20-1951 Today's Date: 02/23/2018 Time:  -      New order for swallow assessment. ST evaluated yesterday revealing normal oropharyngeal swallow function. RN denied new difficulty. Made MD aware via text page and will discontinue order.    Orbie Pyo [place].Ed Risk analyst [phone] Office [phone]

## 2018-02-24 DIAGNOSIS — F332 Major depressive disorder, recurrent severe without psychotic features: Secondary | ICD-10-CM | POA: Diagnosis present

## 2018-02-24 DIAGNOSIS — Z87891 Personal history of nicotine dependence: Secondary | ICD-10-CM

## 2018-02-24 LAB — CBC
HEMATOCRIT: 35.9 % — AB (ref 36.0–46.0)
Hemoglobin: 11.2 g/dL — ABNORMAL LOW (ref 12.0–15.0)
MCH: 28.6 pg (ref 26.0–34.0)
MCHC: 31.2 g/dL (ref 30.0–36.0)
MCV: 91.6 fL (ref 80.0–100.0)
NRBC: 0 % (ref 0.0–0.2)
PLATELETS: 257 10*3/uL (ref 150–400)
RBC: 3.92 MIL/uL (ref 3.87–5.11)
RDW: 13.3 % (ref 11.5–15.5)
WBC: 10.8 10*3/uL — ABNORMAL HIGH (ref 4.0–10.5)

## 2018-02-24 LAB — CK: Total CK: 411 U/L — ABNORMAL HIGH (ref 38–234)

## 2018-02-24 MED ORDER — SERTRALINE HCL 50 MG PO TABS
25.0000 mg | ORAL_TABLET | Freq: Every day | ORAL | Status: DC
  Administered 2018-02-24 – 2018-02-26 (×3): 25 mg via ORAL
  Filled 2018-02-24 (×3): qty 1

## 2018-02-24 NOTE — Progress Notes (Signed)
PROGRESS NOTE  Lori Butler  QIO:[id number] DOB: 1950/10/21 DOA: 02/21/2018 PCP: [name], MD   Brief Narrative: Lori Butler is a 67 y.o. female with a history of COPD, former tobacco use, GERD, and depression who presented to the ED with weakness found down at home alone after intentional overdose with wellbutrin (40 tablets of 150mg ) in suicide attempt. She reported stopping smoking in May and having terrible health ever since including hospitalizations for COPD exacerbation and progressive generalized weakness and deconditioning causing her to miss her part time work at [employer] which has caused financial stress. She was found by her family on her couch confused and weak, having sat there for 24-48 hours with limited memory of events following overdose. In the ED the patient appeared dehydrated, tachycardic with mild leukocytosis and negative infectious work up. CK noted to be 2249 and patient noted to have 555cc retained urine. QTc 438msec. Poison control was consulted, foley placed, IV fluids given and she was admitted for monitoring, psychiatry evaluation, and treatment of rhabdomyolysis. CK, leukocytosis, and tremors have improved. Psychiatry has recommended brief inpatient psychiatric hospitalization when medically stable.  Assessment & Plan: Principal Problem:   Attempted suicide [facility]) Active Problems:   COPD (chronic obstructive pulmonary disease) ([place])   Tobacco abuse   Physical deconditioning   Major depressive disorder, single episode, severe ([place])  Suicide attempt by overdose of wellbutrin: - Psychiatry evaluated patient 10/18 recommending transfer to psychiatric inpatient for treatment. Patient initially agreed on voluntary basis and has since declined to go. Psychiatry reevaluated 10/20 feels IVC would be required and continues to recommend inpatient psychiatric care. - Landscape architect.   Wellbutrin toxicity due to overdose: 150mg  24hr formulation x40 tabs per patient. Pt  had tremors but no seizure like activity. Anticipate prolonged effects with extended release formulation and amount of overdose (6g), though improving as expected. - QRS and QT intervals stable. Can DC telemetry.  Acute urinary retention: As result of OD. Now having diminution of other symptoms. Resolved with voiding trial 10/19.  Rhabdomyolysis: Nontraumatic, improved. CK 2249 >> 411   Dysphagia: Cleared by SLP.  Generalized weakness: Not hypotensive or otherwise symptomatic to suggest adrenal insufficiency. - PT/OT to continue. This is improving day to day.   Sinus tachycardia: Stable. TSH 1.7. Due to wellbutrin, resolved.  COPD: No respiratory symptoms or evidence of flare. Followed in [place] clinic. PFTs 10/2017-moderate airway obstruction, ratio 59, FEV1 51%, FVC 65%, no significant BD response, DLCO 57% - Continue home medications  GERD:  - Continue PPI  Leukocytosis: Suspected stress demargination in the absence of fever, localizing infectious symptoms. Also possibly related to prednisone prescribed by pulm earlier this month. - Improved without Tx. Monitor off antibiotics.  DVT prophylaxis: Heparin Code Status: Full Family Communication: None at bedside Disposition Plan: Inpatient psychiatric hospital when bed available. She is medically stable for discharge.  Consultants:   Psychiatry  Poison control  Procedures:   None  Antimicrobials:  None   Subjective: Feels more like herself today. Does not think she needs inpatient psychiatric care but does not have any established psychiatric care as an outpatient. I tell her I believe she would benefit from it and she says she'll be ok. No seizure activity. Denies current SI.  Objective: Vitals:   02/23/18 2124 02/24/18 0519 02/24/18 0857 02/24/18 0936  BP: 115/62 (!) 125/55  122/67  Pulse: 76 78 79 85  Resp: 18 18 18 18   Temp: 98.2 F (36.8 C) 98.5 F (36.9 C)  98  F (36.7 C)  TempSrc: Oral Oral  Oral    SpO2: 96% 97% 96% 96%   Gen: 67 y.o. female in no distress. Less tremulous. Pulm: Nonlabored breathing room air. Clear. CV: Regular rate and rhythm. No murmur, rub, or gallop. No JVD, no dependent edema. GI: Abdomen soft, non-tender, non-distended, with normoactive bowel sounds.  Ext: Warm, no deformities Skin: No rashes, lesions or ulcers on visualized skin.  Neuro: Alert and oriented. No focal neurological deficits. Psych: Judgement poor and insight impaired. Dysphoric mood with depressed affect.   CBC: Recent Labs  Lab 02/21/18 1404 02/21/18 1729 02/22/18 0543 02/23/18 0438 02/24/18 0538  WBC 21.9* 22.4* 16.1* 11.5* 10.8*  NEUTROABS 19.1*  --   --   --   --   HGB 13.5 12.4 11.3* 11.1* 11.2*  HCT 44.0 39.8 36.5 34.3* 35.9*  MCV 93.0 93.0 93.4 91.7 91.6  PLT 327 304 258 224 701   Basic Metabolic Panel: Recent Labs  Lab 02/21/18 1404 02/21/18 1729 02/22/18 0543 02/23/18 0438  NA 139  --  138 137  K 4.4  --  4.2 4.1  CL 107  --  110 108  CO2 21*  --  17* 22  GLUCOSE 72  --  55* 77  BUN 36*  --  24* 14  CREATININE 1.03* 0.87 0.70 0.68  CALCIUM 8.8*  --  7.7* 7.6*   Cardiac Enzymes: Recent Labs  Lab 02/21/18 1729 02/22/18 0543 02/23/18 0438 02/24/18 0538  CKTOTAL 2,249* 1,745* 709* 411*   Thyroid Function Tests: Recent Labs    02/21/18 1729  TSH 1.700   Time spent: 25 minutes.  Patrecia Pour, MD [facility] [email or web address] Password [email or web address] 02/24/2018, 1:32 PM

## 2018-02-24 NOTE — Consult Note (Signed)
[facility] Psychiatry Consult   Reason for Consult:  Suicide attempt Referring Physician:  Dr. Bonner Puna Patient Identification: Lori Butler MRN:  [id number] Principal Diagnosis: Attempted suicide [facility]) Diagnosis:   Patient Active Problem List   Diagnosis Date Noted  . Major depressive disorder, single episode, severe ([place]) [F32.2] 02/24/2018  . Attempted suicide ([place]) [T14.91XA] 02/21/2018  . Hypoxia [R09.02]   . Physical deconditioning [R53.81]   . COPD exacerbation ([place]) [J44.1]   . Vitamin B12 deficiency [E53.8] 11/10/2017  . Osteoporosis [M81.0] 11/10/2017  . Vitamin D deficiency [E55.9] 11/10/2017  . Tobacco abuse [Z72.0] 09/20/2017  . COPD (chronic obstructive pulmonary disease) ([place]) [J44.9] 09/15/2017    Total Time spent with patient: 45 minutes  Subjective:   Lori Butler is a 67 y.o. female admitted after she attempted suicide by overdosing on Wellbutrin  HPI:  Patient with history of anxiety, depression, COPD, former tobacco use, GERD who was admitted after she attempted suicide by intentionally overdosed on 40 tablets of 150 mg Wellbutrin. Patient reports being stressed out, overwhelmed by numerous medical issues and inability to pay her bills. She seems to be minimizing her symptoms but she is tearful during interview, reports being hopeless and lack of motivation to carry on. Patient lives alone and says it has been increasingly difficult to carry on her daily activities of living with no help due to her physical limitation. Patient denies psychosis, delusions but apprehensive, worries about her future and unable to contract for safety even though she is asking to be discharged.   Past Psychiatric History: denies by patient . But chart review indicates prior history of anxiety and Depression  Risk to Self:  yes Risk to Others:  denies Prior Inpatient Therapy:  none Prior Outpatient Therapy:  none  Past Medical History:  Past Medical History:  Diagnosis Date   . Acute respiratory failure with hypoxia ([place])   . Anxiety   . Aortic atherosclerosis ([place])   . Arthritis    "a little in my hands probably" (10/31/2017)  . COPD (chronic obstructive pulmonary disease) ([place])   . COPD exacerbation ([place])   . Depression   . History of kidney stones   . Suicide attempt ([place]) 02/21/2018   overdosed on Wellbutrin    Past Surgical History:  Procedure Laterality Date  . ABDOMINAL HYSTERECTOMY    . APPENDECTOMY    . BREAST BIOPSY Left 1990s  . CATARACT EXTRACTION W/ INTRAOCULAR LENS  IMPLANT, BILATERAL Bilateral   . DILATION AND CURETTAGE OF UTERUS    . TONSILLECTOMY    . TUBAL LIGATION     Family History:  Family History  Problem Relation Age of Onset  . Breast cancer Mother    Family Psychiatric  History: Denies any family history of mental health Social History:  Social History   Substance and Sexual Activity  Alcohol Use Not Currently   Comment: 02/21/2018 "couple drinks/year"     Social History   Substance and Sexual Activity  Drug Use Never    Social History   Socioeconomic History  . Marital status: Divorced    Spouse name: Not on file  . Number of children: Not on file  . Years of education: Not on file  . Highest education level: Not on file  Occupational History  . Not on file  Social Needs  . Financial resource strain: Somewhat hard  . Food insecurity:    Worry: Not on file    Inability: Not on file  . Transportation needs:  Medical: Not on file    Non-medical: Not on file  Tobacco Use  . Smoking status: Former Smoker    Packs/day: 1.50    Years: 51.00    Pack years: 76.50    Types: Cigarettes    Last attempt to quit: 09/05/2017    Years since quitting: 0.4  . Smokeless tobacco: Never Used  Substance and Sexual Activity  . Alcohol use: Not Currently    Comment: 02/21/2018 "couple drinks/year"  . Drug use: Never  . Sexual activity: Not Currently  Lifestyle  . Physical activity:    Days per week: Not on file     Minutes per session: Not on file  . Stress: Not on file  Relationships  . Social connections:    Talks on phone: Not on file    Gets together: Not on file    Attends religious service: Not on file    Active member of club or organization: Not on file    Attends meetings of clubs or organizations: Not on file    Relationship status: Not on file  Other Topics Concern  . Not on file  Social History Narrative  . Not on file   Additional Social History:    Allergies:   Allergies  Allergen Reactions  . Demerol [Meperidine] Other (See Comments)    "horrible reaction"  . Oxycodone-Acetaminophen Other (See Comments)    Upset stomach   . Tramadol Nausea And Vomiting    Labs:  Results for orders placed or performed during the hospital encounter of 02/21/18 (from the past 48 hour(s))  Basic metabolic panel     Status: Abnormal   Collection Time: 02/23/18  4:38 AM  Result Value Ref Range   Sodium 137 135 - 145 mmol/L   Potassium 4.1 3.5 - 5.1 mmol/L   Chloride 108 98 - 111 mmol/L   CO2 22 22 - 32 mmol/L   Glucose, Bld 77 70 - 99 mg/dL   BUN 14 8 - 23 mg/dL   Creatinine, Ser 0.68 0.44 - 1.00 mg/dL   Calcium 7.6 (L) 8.9 - 10.3 mg/dL   GFR calc non Af Amer >60 >60 mL/min   GFR calc Af Amer >60 >60 mL/min    Comment: (NOTE) The eGFR has been calculated using the CKD EPI equation. This calculation has not been validated in all clinical situations. eGFR's persistently <60 mL/min signify possible Chronic Kidney Disease.    Anion gap 7 5 - 15    Comment: Performed at [facility] [address] [place], [place] [address]  CBC     Status: Abnormal   Collection Time: 02/23/18  4:38 AM  Result Value Ref Range   WBC 11.5 (H) 4.0 - 10.5 K/uL   RBC 3.74 (L) 3.87 - 5.11 MIL/uL   Hemoglobin 11.1 (L) 12.0 - 15.0 g/dL   HCT 34.3 (L) 36.0 - 46.0 %   MCV 91.7 80.0 - 100.0 fL   MCH 29.7 26.0 - 34.0 pg   MCHC 32.4 30.0 - 36.0 g/dL   RDW 13.3 11.5 - 15.5 %   Platelets 224 150 -  400 K/uL   nRBC 0.0 0.0 - 0.2 %    Comment: Performed at [facility], [place] [address], [place], [place] [address]  CK     Status: Abnormal   Collection Time: 02/23/18  4:38 AM  Result Value Ref Range   Total CK 709 (H) 38 - 234 U/L    Comment: Performed at  [facility], [place] [address] [place], [place] [address]  CBC     Status: Abnormal   Collection Time: 02/24/18  5:38 AM  Result Value Ref Range   WBC 10.8 (H) 4.0 - 10.5 K/uL   RBC 3.92 3.87 - 5.11 MIL/uL   Hemoglobin 11.2 (L) 12.0 - 15.0 g/dL   HCT 35.9 (L) 36.0 - 46.0 %   MCV 91.6 80.0 - 100.0 fL   MCH 28.6 26.0 - 34.0 pg   MCHC 31.2 30.0 - 36.0 g/dL   RDW 13.3 11.5 - 15.5 %   Platelets 257 150 - 400 K/uL   nRBC 0.0 0.0 - 0.2 %    Comment: Performed at [facility], [place] [address] [place], [place] [address]  CK     Status: Abnormal   Collection Time: 02/24/18  5:38 AM  Result Value Ref Range   Total CK 411 (H) 38 - 234 U/L    Comment: Performed at [facility], [place] [address], [place], [place] [address]    Current Facility-Administered Medications  Medication Dose Route Frequency Provider Last Rate Last Dose  . acetaminophen (TYLENOL) tablet 650 mg  650 mg Oral Q6H PRN Thurnell Lose, MD       Or  . acetaminophen (TYLENOL) suppository 650 mg  650 mg Rectal Q6H PRN Thurnell Lose, MD      . albuterol (PROVENTIL) (2.5 MG/3ML) 0.083% nebulizer solution 2.5 mg  2.5 mg Nebulization Q4H PRN Thurnell Lose, MD      . bisacodyl (DULCOLAX) EC tablet 5 mg  5 mg Oral Daily PRN Thurnell Lose, MD      . calcium carbonate (TUMS - dosed in mg elemental calcium) chewable tablet 400 mg of elemental calcium  400 mg of elemental calcium Oral Once Thurnell Lose, MD      . guaiFENesin (MUCINEX) 12 hr tablet 600 mg  600 mg Oral BID Thurnell Lose, MD   600 mg at 02/24/18 0912  . heparin injection 5,000 Units  5,000 Units Subcutaneous Q8H Thurnell Lose, MD   5,000 Units at 02/24/18 0554  .  pantoprazole (PROTONIX) EC tablet 40 mg  40 mg Oral BID Thurnell Lose, MD   40 mg at 02/24/18 0913  . sertraline (ZOLOFT) tablet 25 mg  25 mg Oral Daily Quanta Roher, MD      . sodium chloride 0.9 % bolus 500 mL  500 mL Intravenous Once Lala Lund K, MD      . umeclidinium bromide (INCRUSE ELLIPTA) 62.5 MCG/INH 1 puff  1 puff Inhalation Daily Thurnell Lose, MD   1 puff at 02/24/18 0857    Musculoskeletal: Strength & Muscle Tone: within normal limits Gait & Station: lying in bed Patient leans: N/A  Psychiatric Specialty Exam: Physical Exam  Nursing note and vitals reviewed. Constitutional: She is oriented to person, place, and time. She appears well-developed and well-nourished.  Cardiovascular: Normal rate.  Respiratory: Effort normal.  Musculoskeletal: Normal range of motion.  Neurological: She is alert and oriented to person, place, and time.  Skin: Skin is warm.  Psychiatric: Her affect is blunt. Her speech is delayed. She is slowed and withdrawn. Cognition and memory are normal. She expresses impulsivity. She exhibits a depressed mood. She expresses suicidal ideation. She expresses suicidal plans.    Review of Systems  Constitutional: Negative.   HENT: Negative.   Skin: Negative.   Neurological: Negative.   Endo/Heme/Allergies: Negative.  Psychiatric/Behavioral: Positive for depression and suicidal ideas. Negative for hallucinations and substance abuse. The patient is nervous/anxious.     Blood pressure 122/67, pulse 85, temperature 98 F (36.7 C), temperature source Oral, resp. rate 18, SpO2 96 %.There is no height or weight on file to calculate BMI.  General Appearance: Casual  Eye Contact:  Minimal  Speech:  Clear and Coherent and Slow  Volume:  Decreased  Mood:  Depressed and Dysphoric  Affect:  Constricted, Depressed and Tearful  Thought Process:  Coherent, Linear and Descriptions of Associations: Intact  Orientation:  Full (Time, Place, and Person)   Thought Content:  WDL and Logical  Suicidal Thoughts:  Yes.  without intent/plan  Homicidal Thoughts:  No  Memory:  Immediate;   Good Recent;   Good Remote;   Fair  Judgement:  Poor  Insight:  Fair and Shallow  Psychomotor Activity:  Psychomotor Retardation  Concentration:  Concentration: Fair and Attention Span: Fair  Recall:  Good  Fund of Knowledge:  Good  Language:  Good  Akathisia:  No  Handed:  Right  AIMS (if indicated):     Assets:  Communication Skills Desire for Improvement Housing Resilience Social Support  ADL's:  marginal  Cognition:  WNL  Sleep:        Treatment Plan Summary:  67 y.o. female with a history of, Depression, COPD, former tobacco use, GERD, and Anxiety who was admitted following a suicide attempt by overdosing on Wellbutrin. Patient remains, severely depressed, tearful, hopeless, amotivated, apprehensive, irritable and unable to contract for safety.  Recommendations: -Continue 1:1 sitter for safety -Consider Zoloft 25 mg daily for depression and anxiety. -Monitor patient for seizure disorder due to the amount of Wellbutrin ingested by the patient at once(Note that taking more that 15 tablets of 150 mg of Wellbutrin at once can induce seizure disorder). -Patient still needs inpatient psychiatric admission, please consider Involuntary commitment if patient refused voluntary admission. - Psychiatric service signing out, re-consult as needed.  Disposition: Recommend psychiatric Inpatient admission when medically cleared. Supportive therapy provided about ongoing stressors. Consider social worker consult to assist with placin patient in a Geriatric psychiatric hospital.  Corena Pilgrim, MD 02/24/2018 1:02 PM

## 2018-02-25 DIAGNOSIS — F322 Major depressive disorder, single episode, severe without psychotic features: Secondary | ICD-10-CM

## 2018-02-25 MED ORDER — ZOLPIDEM TARTRATE 5 MG PO TABS
5.0000 mg | ORAL_TABLET | Freq: Every evening | ORAL | Status: DC | PRN
  Administered 2018-02-25: 5 mg via ORAL
  Filled 2018-02-25: qty 1

## 2018-02-25 NOTE — Progress Notes (Addendum)
PROGRESS NOTE  Lori Butler  QQV:[id number] DOB: March 27, 1951 DOA: 02/21/2018 PCP: [name], MD   Brief Narrative: Lori Butler is a 67 y.o. female with a history of COPD, former tobacco use, GERD, and depression who presented to the ED with weakness found down at home alone after intentional overdose with wellbutrin (40 tablets of 150mg ) in suicide attempt. She reported stopping smoking in May and having terrible health ever since including hospitalizations for COPD exacerbation and progressive generalized weakness and deconditioning causing her to miss her part time work at [employer] which has caused financial stress. She was found by her family on her couch confused and weak, having sat there for 24-48 hours with limited memory of events following overdose. In the ED the patient appeared dehydrated, tachycardic with mild leukocytosis and negative infectious work up. CK noted to be 2249 and patient noted to have 555cc retained urine. QTc 437msec. Poison control was consulted, foley placed, IV fluids given and she was admitted for monitoring, psychiatry evaluation, and treatment of rhabdomyolysis. CK, leukocytosis, and tremors have improved. Psychiatry has recommended brief inpatient psychiatric hospitalization when medically stable.  Assessment & Plan: Principal Problem:   Attempted suicide [facility]) Active Problems:   COPD (chronic obstructive pulmonary disease) ([place])   Tobacco abuse   Physical deconditioning   Major depressive disorder, single episode, severe ([place])  Suicide attempt by overdose of wellbutrin: - Psychiatry evaluated patient 10/18 recommending transfer to psychiatric inpatient for treatment. Patient initially agreed on voluntary basis and has since declined to go. Psychiatry reevaluated 10/20 feels IVC would be required and continues to recommend inpatient psychiatric care. - Landscape architect.   Wellbutrin toxicity due to overdose: 150mg  24hr formulation x40 tabs per patient. Pt  had tremors but no seizure like activity. Resolved. QRS and QT intervals stable so telemetry discontinued.  Acute urinary retention: As result of OD. Resolved 10/19.  Rhabdomyolysis: Nontraumatic, improved. CK 2249 >> 411   Dysphagia: Cleared by SLP.  Generalized weakness: Improving. - PT/OT.   Sinus tachycardia: Stable. TSH 1.7. Due to wellbutrin, resolved.  COPD: No respiratory symptoms or evidence of flare. Followed in [facility]. PFTs 10/2017-moderate airway obstruction, ratio 59, FEV1 51%, FVC 65%, no significant BD response, DLCO 57% - Continue home medications  GERD:  - Continue PPI  Leukocytosis: Suspected stress demargination in the absence of fever, localizing infectious symptoms. Also possibly related to prednisone prescribed by pulm earlier this month. - Improved without Tx.  DVT prophylaxis: Heparin Code Status: Full Family Communication: None at bedside Disposition Plan: Inpatient psychiatric hospital when bed available, would need IVC paperwork if patient refuses further inpatient treatment. She is medically stable for discharge.  Consultants:   Psychiatry  Poison control  Procedures:   None  Antimicrobials:  None   Subjective: Feels more like herself today. Does not think she needs inpatient psychiatric care but does not have any established psychiatric care as an outpatient. I tell her I believe she would benefit from it and she says she'll be ok. No seizure activity. Denies current SI.  Objective: Vitals:   02/24/18 2155 02/25/18 0508 02/25/18 0851 02/25/18 0854  BP: 134/64 130/69 128/73   Pulse: 97 91 98 96  Resp: 20 18 20 18   Temp: 98.2 F (36.8 C) 98.1 F (36.7 C) 98.2 F (36.8 C)   TempSrc: Oral Oral Oral   SpO2: 95% 96% 95% 94%   Gen: 67 y.o. female in no distress Pulm: Nonlabored  CV: No JVD, no dependent edema. Ext: Warm,  no deformities Skin: No rashes, lesions or ulcers on visualized skin.  Neuro: Alert and oriented. No  focal neurological deficits. Psych: Judgement and insight appear poor/impaired. Depressed mood, congruent, constricted affect.  CBC: Recent Labs  Lab 02/21/18 1404 02/21/18 1729 02/22/18 0543 02/23/18 0438 02/24/18 0538  WBC 21.9* 22.4* 16.1* 11.5* 10.8*  NEUTROABS 19.1*  --   --   --   --   HGB 13.5 12.4 11.3* 11.1* 11.2*  HCT 44.0 39.8 36.5 34.3* 35.9*  MCV 93.0 93.0 93.4 91.7 91.6  PLT 327 304 258 224 094   Basic Metabolic Panel: Recent Labs  Lab 02/21/18 1404 02/21/18 1729 02/22/18 0543 02/23/18 0438  NA 139  --  138 137  K 4.4  --  4.2 4.1  CL 107  --  110 108  CO2 21*  --  17* 22  GLUCOSE 72  --  55* 77  BUN 36*  --  24* 14  CREATININE 1.03* 0.87 0.70 0.68  CALCIUM 8.8*  --  7.7* 7.6*   Cardiac Enzymes: Recent Labs  Lab 02/21/18 1729 02/22/18 0543 02/23/18 0438 02/24/18 0538  CKTOTAL 2,249* 1,745* 709* 411*    Time spent: 15 minutes.  Patrecia Pour, MD [facility] [email or web address] Password [email or web address] 02/25/2018, 12:51 PM

## 2018-02-25 NOTE — Progress Notes (Signed)
Physical Therapy Treatment Patient Details Name: Lori Butler MRN: [id number] DOB: Nov 27, 1950 Today's Date: 02/25/2018    History of Present Illness 67 y.o. female, with history of COPD, mild depression, osteoporosis, GERD who lives at home by herself, has been having issues with some generalized weakness and deconditioning since her last admission to the hospital few months ago for COPD exacerbation.  Admitted for Wellbutrin overdose, leukocytosis, severe dehydration.      PT Comments    Pt progressing towards physical therapy goals. Pt able to ambulate without RW this session with close guard for safety, but no physical assistance. Affect appeared to be improved by end of session and feel this patient could benefit from continued physical therapy to focus on 1-on-1 guided exercise program and mobility training. Will continue to follow.    Follow Up Recommendations  Other (comment)(Inpatient psych)     Equipment Recommendations  None recommended by PT    Recommendations for Other Services       Precautions / Restrictions Precautions Precautions: Fall Restrictions Weight Bearing Restrictions: No    Mobility  Bed Mobility Overal bed mobility: Modified Independent Bed Mobility: Supine to Sit           General bed mobility comments: Pt was able to transition to EOB without assistance and without use of rails. Increased time, but likely due to bed position (knee area elevated)  Transfers Overall transfer level: Needs assistance Equipment used: None Transfers: Sit to/from Stand Sit to Stand: Supervision         General transfer comment: Pt demonstrated proper hand placement on seated surface for safety. No assist required to power up to full stand.   Ambulation/Gait Ambulation/Gait assistance: Min guard Gait Distance (Feet): 120 Feet Assistive device: None Gait Pattern/deviations: Step-through pattern;Decreased stride length Gait velocity: decreased Gait velocity  interpretation: <1.31 ft/sec, indicative of household ambulator General Gait Details: Slow but generally steady. Pt wanting to attempt ambulation without RW. Occasional standing rest break required due to fatigue but overall managed well without UE support. Hands-on guarding provided for safety.    Stairs             Wheelchair Mobility    Modified Rankin (Stroke Patients Only)       Balance Overall balance assessment: Needs assistance Sitting-balance support: Feet supported;No upper extremity supported Sitting balance-Leahy Scale: Fair     Standing balance support: Bilateral upper extremity supported;During functional activity Standing balance-Leahy Scale: Fair                              Cognition Arousal/Alertness: Awake/alert Behavior During Therapy: WFL for tasks assessed/performed;Flat affect Overall Cognitive Status: Within Functional Limits for tasks assessed                                 General Comments: Appeared very depressed initially however by end of session mood seemed to lighten      Exercises General Exercises - Lower Extremity Long Arc Quad: 15 reps;Both Other Exercises Other Exercises: Resisted hamstring curls in sitting x10 each leg Other Exercises: Alternating cross punches to therapist's hands - adjusting height of target throughout set (x20 reps) Other Exercises: Guided breathing - inhale with arms overhead and exhale bringing arms down    General Comments        Pertinent Vitals/Pain Pain Assessment: No/denies pain    Home Living  Prior Function            PT Goals (current goals can now be found in the care plan section) Acute Rehab PT Goals Patient Stated Goal: return to work PT Goal Formulation: With patient/family Time For Goal Achievement: 03/08/18 Potential to Achieve Goals: Good Progress towards PT goals: Progressing toward goals    Frequency    Min  3X/week      PT Plan Discharge plan needs to be updated    Co-evaluation              AM-PAC PT "6 Clicks" Daily Activity  Outcome Measure  Difficulty turning over in bed (including adjusting bedclothes, sheets and blankets)?: None Difficulty moving from lying on back to sitting on the side of the bed? : None Difficulty sitting down on and standing up from a chair with arms (e.g., wheelchair, bedside commode, etc,.)?: None Help needed moving to and from a bed to chair (including a wheelchair)?: A Little Help needed walking in hospital room?: A Little Help needed climbing 3-5 steps with a railing? : A Little 6 Click Score: 21    End of Session Equipment Utilized During Treatment: Gait belt Activity Tolerance: Patient tolerated treatment well Patient left: in chair;with call bell/phone within reach;with nursing/sitter in room(Sitter) Nurse Communication: Mobility status PT Visit Diagnosis: Muscle weakness (generalized) (M62.81);Difficulty in walking, not elsewhere classified (R26.2);Other abnormalities of gait and mobility (R26.89)     Time: 2952-8413 PT Time Calculation (min) (ACUTE ONLY): 12 min  Charges:  $Gait Training: 8-22 mins                     Rolinda Roan, PT, DPT Acute Rehabilitation Services Pager: [phone] Office: [phone]    Lori Butler 02/25/2018, 9:20 AM

## 2018-02-25 NOTE — Clinical Social Work Note (Signed)
[place] and referral made and follow-up will be made with CSW tomorrow. Call made to [facility] and they do not have any beds this evening. CSW advised to call again tomorrow.  Shandel Busic Givens, MSW, LCSW Licensed Clinical Social Worker [place] [phone]

## 2018-02-25 NOTE — Care Management Important Message (Signed)
Important Message  Patient Details  Name: Lori Butler MRN: [id number] Date of Birth: 08/11/1950   Medicare Important Message Given:  Yes    Lori Butler 02/25/2018, 3:18 PM

## 2018-02-26 ENCOUNTER — Encounter (HOSPITAL_COMMUNITY): Payer: Self-pay | Admitting: Psychiatry

## 2018-02-26 ENCOUNTER — Inpatient Hospital Stay
Admission: AD | Admit: 2018-02-26 | Discharge: 2018-02-28 | DRG: 885 | Disposition: A | Discharge status: Home / Self Care | Payer: Medicare HMO | Source: Intra-hospital | Attending: Psychiatry | Admitting: Psychiatry

## 2018-02-26 ENCOUNTER — Other Ambulatory Visit: Payer: Self-pay

## 2018-02-26 ENCOUNTER — Encounter: Payer: Self-pay | Admitting: Psychiatry

## 2018-02-26 DIAGNOSIS — Z7983 Long term (current) use of bisphosphonates: Secondary | ICD-10-CM

## 2018-02-26 DIAGNOSIS — Z888 Allergy status to other drugs, medicaments and biological substances status: Secondary | ICD-10-CM | POA: Diagnosis not present

## 2018-02-26 DIAGNOSIS — Z885 Allergy status to narcotic agent status: Secondary | ICD-10-CM

## 2018-02-26 DIAGNOSIS — T43292A Poisoning by other antidepressants, intentional self-harm, initial encounter: Secondary | ICD-10-CM | POA: Diagnosis not present

## 2018-02-26 DIAGNOSIS — Z961 Presence of intraocular lens: Secondary | ICD-10-CM | POA: Diagnosis present

## 2018-02-26 DIAGNOSIS — Z87891 Personal history of nicotine dependence: Secondary | ICD-10-CM

## 2018-02-26 DIAGNOSIS — Z9842 Cataract extraction status, left eye: Secondary | ICD-10-CM | POA: Diagnosis not present

## 2018-02-26 DIAGNOSIS — F419 Anxiety disorder, unspecified: Secondary | ICD-10-CM | POA: Diagnosis present

## 2018-02-26 DIAGNOSIS — Z79899 Other long term (current) drug therapy: Secondary | ICD-10-CM

## 2018-02-26 DIAGNOSIS — E559 Vitamin D deficiency, unspecified: Secondary | ICD-10-CM | POA: Diagnosis present

## 2018-02-26 DIAGNOSIS — J449 Chronic obstructive pulmonary disease, unspecified: Secondary | ICD-10-CM | POA: Diagnosis not present

## 2018-02-26 DIAGNOSIS — Z87442 Personal history of urinary calculi: Secondary | ICD-10-CM

## 2018-02-26 DIAGNOSIS — F332 Major depressive disorder, recurrent severe without psychotic features: Principal | ICD-10-CM | POA: Diagnosis present

## 2018-02-26 DIAGNOSIS — Z9841 Cataract extraction status, right eye: Secondary | ICD-10-CM | POA: Diagnosis not present

## 2018-02-26 DIAGNOSIS — Z7951 Long term (current) use of inhaled steroids: Secondary | ICD-10-CM | POA: Diagnosis not present

## 2018-02-26 MED ORDER — PANTOPRAZOLE SODIUM 40 MG PO TBEC
40.0000 mg | DELAYED_RELEASE_TABLET | Freq: Every day | ORAL | Status: DC
  Administered 2018-02-27 – 2018-02-28 (×2): 40 mg via ORAL
  Filled 2018-02-26 (×2): qty 1

## 2018-02-26 MED ORDER — LORAZEPAM 1 MG PO TABS
1.0000 mg | ORAL_TABLET | ORAL | Status: DC | PRN
  Administered 2018-02-26: 1 mg via ORAL
  Filled 2018-02-26: qty 1

## 2018-02-26 MED ORDER — ALUM & MAG HYDROXIDE-SIMETH 200-200-20 MG/5ML PO SUSP: 30.0000 mL | ORAL | Status: DC | PRN

## 2018-02-26 MED ORDER — ACETAMINOPHEN 325 MG PO TABS: 650.0000 mg | ORAL_TABLET | Freq: Four times a day (QID) | ORAL | Status: DC | PRN

## 2018-02-26 MED ORDER — MAGNESIUM HYDROXIDE 400 MG/5ML PO SUSP: 30.0000 mL | Freq: Every day | ORAL | Status: DC | PRN

## 2018-02-26 MED ORDER — TRAZODONE HCL 100 MG PO TABS
100.0000 mg | ORAL_TABLET | Freq: Every evening | ORAL | Status: DC | PRN
  Administered 2018-02-27: 100 mg via ORAL
  Filled 2018-02-26: qty 1

## 2018-02-26 NOTE — Tx Team (Signed)
Initial Treatment Plan 02/26/2018 11:23 PM [name] [id number]    PATIENT STRESSORS: Health problems Other: quit smoking, depression   PATIENT STRENGTHS: Ability for insight Average or above average intelligence Capable of independent living Communication skills Financial means General fund of knowledge Special hobby/interest Supportive family/friends Work skills   PATIENT IDENTIFIED PROBLEMS: Depression 02/26/18  Anxiety 02/26/18  Suicidal thoughts 02/26/18                 DISCHARGE CRITERIA:  Improved stabilization in mood, thinking, and/or behavior Motivation to continue treatment in a less acute level of care Need for constant or close observation no longer present Verbal commitment to aftercare and medication compliance  PRELIMINARY DISCHARGE PLAN: Outpatient therapy Return to previous living arrangement Return to previous work or school arrangements  PATIENT/FAMILY INVOLVEMENT: This treatment plan has been presented to and reviewed with the patient, Lori Butler.  The patient has been given the opportunity to ask questions and make suggestions.  Reyes Ivan, RN 02/26/2018, 11:23 PM

## 2018-02-26 NOTE — Progress Notes (Signed)
Reported given to [place] at [facility].

## 2018-02-26 NOTE — Clinical Social Work Note (Addendum)
Patient discharging to [facility] this evening. Please see progress note by Ian Malkin, Counselor, Behavioral Health. Transport arranged with [employer] for patient to be picked up at 8:30 pm. Voluntary admissions form completed and will be placed on patient's chart to be given to [facility]. Patient advised of transport pick-up time and son, Rebbecca Osuna [phone], cell) contacted and updated. CSW signing off as no other SW intervention services needed.  Jahyra Sukup Givens, MSW, LCSW Licensed Clinical Social Worker [place] [phone]

## 2018-02-26 NOTE — Plan of Care (Signed)
Patient recently admitted at this time. Patient has not had sufficient time to adequately show improvements at this time. Will continue to monitor for progressions.     Problem: Health Behavior/Discharge Planning: Goal: Compliance with treatment plan for underlying cause of condition will improve Outcome: Not Progressing   Problem: Safety: Goal: Periods of time without injury will increase Outcome: Not Progressing   Problem: Coping: Goal: Coping ability will improve Outcome: Not Progressing   Problem: Medication: Goal: Compliance with prescribed medication regimen will improve Outcome: Not Progressing   Problem: Self-Concept: Goal: Ability to disclose and discuss suicidal ideas will improve Outcome: Not Progressing Goal: Will verbalize positive feelings about self Outcome: Not Progressing   Problem: Self-Concept: Goal: Will verbalize positive feelings about self Outcome: Not Progressing Goal: Level of anxiety will decrease Outcome: Not Progressing

## 2018-02-26 NOTE — BH Assessment (Addendum)
Patient has been accepted to [facility].  Accepting physician is Dr. Bary Leriche.  Attending Physician will be Dr. Wonda Olds.  Patient has been assigned to room 302, by [place] Charge Nurse Mechele Claude.  Call report to [phone].  Representative/Transfer Coordinator is Sharina Petre Patient pre-admitted by [facility] Patient Access Elberta Fortis)  [place] [facility] Lorriane Shire, Social Work) made aware of acceptance.  Patient bed will be available after 8:30pm

## 2018-02-26 NOTE — Discharge Summary (Signed)
Physician Discharge Summary  Lori Butler DGU:[id number] DOB: 1950-05-26 DOA: 02/21/2018  PCP: Lori Lass, MD  Admit date: 02/21/2018 Discharge date: 02/26/2018  Admitted From: Home Disposition: [facility]   Recommendations for Outpatient Follow-up:  1. Follow up with psychiatry  Home Health: N/A Equipment/Devices: None Discharge Condition: Stable CODE STATUS: Full Diet recommendation: As tolerated  Brief/Interim Summary: Lori Butler is a 67 y.o. female with a history of COPD, former tobacco use, GERD, and depression who presented to the ED with weakness found down at home alone after intentional overdose with wellbutrin (40 tablets of 150mg ) in suicide attempt. She reported stopping smoking in May and having terrible health ever since including hospitalizations for COPD exacerbation and progressive generalized weakness and deconditioning causing her to miss her part time work at [employer] which has caused financial stress. She was found by her family on her couch confused and weak, having sat there for 24-48 hours with limited memory of events following overdose. In the ED the patient appeared dehydrated, tachycardic with mild leukocytosis and negative infectious work up. CK noted to be 2249 and patient noted to have 555cc retained urine. QTc 41msec. Poison control was consulted, foley placed, IV fluids given and she was admitted for monitoring, psychiatry evaluation, and treatment of rhabdomyolysis. CK, leukocytosis, and tremors have improved. Psychiatry has recommended brief inpatient psychiatric hospitalization, for which the patient is medically stable.  Discharge Diagnoses:  Principal Problem:   Attempted suicide [facility]) Active Problems:   COPD (chronic obstructive pulmonary disease) ([place])   Tobacco abuse   Physical deconditioning   Major depressive disorder, single episode, severe ([place])  Suicide attempt by overdose of wellbutrin: - Psychiatry evaluated  patient 10/18 recommending transfer to psychiatric inpatient for treatment. Patient initially agreed on voluntary basis and has since declined to go. Psychiatry reevaluated 10/20 feels IVC would be required and continues to recommend inpatient psychiatric care. Discussed with patient today. 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   Depression:  - Started sertraline 25mg  per psychiatry recommendations. Will defer to psych hospital ongoing management.  Wellbutrin toxicity due to overdose: 150mg  24hr formulation x40 tabs per patient. Pt had tremors but no seizure like activity. Resolved. QRS and QT intervals stable so telemetry discontinued.  Acute urinary retention: As result of OD. Resolved 10/19.  Rhabdomyolysis: Nontraumatic, improved. CK 2249 >> 411   Dysphagia: Cleared by SLP.  Generalized weakness: Improving. - PT/OT.   Sinus tachycardia: Stable. TSH 1.7. Due to wellbutrin, resolved.  COPD: Followed in [place] clinic. PFTs 10/2017-moderate airway obstruction, ratio 59, FEV1 51%, FVC 65%, no significantBDresponse, DLCO 57% - Continue home medications. continues to have no evidence of distress.  GERD:  - Continue PPI  Leukocytosis: Suspected stress demargination in the absence of fever, localizing infectious symptoms. Also possibly related to prednisone prescribed by pulm earlier this month. - Improved without Tx.  Discharge Instructions  Allergies as of 02/26/2018      Reactions   Demerol [meperidine] Other (See Comments)   "horrible reaction"   Oxycodone-acetaminophen Other (See Comments)   Upset stomach    Tramadol Nausea And Vomiting      Medication List    STOP taking these medications   buPROPion 100 MG 12 hr tablet Commonly known as:  WELLBUTRIN SR   buPROPion 150 MG 24 hr tablet Commonly known as:  WELLBUTRIN XL   predniSONE 10 MG tablet Commonly known as:  DELTASONE   Vitamin D (Ergocalciferol) 50000 units Caps  capsule Commonly known  as:  DRISDOL     TAKE these medications   acetaminophen 500 MG tablet Commonly known as:  TYLENOL Take 1,000 mg by mouth every 6 (six) hours as needed for mild pain or headache.   albuterol (2.5 MG/3ML) 0.083% nebulizer solution Commonly known as:  PROVENTIL Take 3 mLs (2.5 mg total) by nebulization every 4 (four) hours as needed for wheezing or shortness of breath.   albuterol 108 (90 Base) MCG/ACT inhaler Commonly known as:  PROVENTIL HFA;VENTOLIN HFA Inhale 2 puffs into the lungs every 6 (six) hours as needed for wheezing or shortness of breath.   alendronate 70 MG tablet Commonly known as:  FOSAMAX Take 70 mg by mouth every Wednesday.   budesonide-formoterol 160-4.5 MCG/ACT inhaler Commonly known as:  SYMBICORT Inhale 2 puffs into the lungs 2 (two) times daily.   guaiFENesin 600 MG 12 hr tablet Commonly known as:  MUCINEX Take 2 tablets (1,200 mg total) by mouth 2 (two) times daily. What changed:  how much to take   montelukast 10 MG tablet Commonly known as:  SINGULAIR Take 1 tablet (10 mg total) by mouth at bedtime.   pantoprazole 40 MG tablet Commonly known as:  PROTONIX Take 1 tablet (40 mg total) by mouth 2 (two) times daily.   tiotropium 18 MCG inhalation capsule Commonly known as:  SPIRIVA Place 1 capsule (18 mcg total) into inhaler and inhale daily.      Follow-up Information    Lori Lass, MD Follow up.   Specialty:  Family Medicine Contact information: [place] [address] [phone]          Allergies  Allergen Reactions  . Demerol [Meperidine] Other (See Comments)    "horrible reaction"  . Oxycodone-Acetaminophen Other (See Comments)    Upset stomach   . Tramadol Nausea And Vomiting    Consultations:  Psychiatry  Poison control  Procedures/Studies: Dg Chest 2 View  Result Date: 02/21/2018 CLINICAL DATA:  Chest pain and weakness. Acute respiratory failure, COPD, former smoker.  Possible overdose. EXAM: CHEST - 2 VIEW COMPARISON:  Portable chest x-ray of December 25, 2017 FINDINGS: There is mild chronic elevation of the right hemidiaphragm. The lungs are adequately inflated. The interstitial markings are coarse. There is no alveolar infiltrate. There is no pleural effusion. The heart and pulmonary vascularity are normal. The mediastinum is normal in width. The bony thorax exhibits no acute abnormality. IMPRESSION: Mild chronic bronchitic changes, stable. No pneumonia, CHF, nor other acute cardiopulmonary abnormality. Electronically Signed   By: David  Martinique M.D.   On: 02/21/2018 14:38   Ct Head Wo Contrast  Result Date: 02/21/2018 CLINICAL DATA:  Altered mental status today. EXAM: CT HEAD WITHOUT CONTRAST TECHNIQUE: Contiguous axial images were obtained from the base of the skull through the vertex without intravenous contrast. COMPARISON:  Brain MRI 10/30/2017.  Head CT scan 10/29/2017. FINDINGS: Brain: No evidence of acute infarction, hemorrhage, hydrocephalus, extra-axial collection or mass lesion/mass effect. Mild atrophy and chronic microvascular ischemic change noted. Vascular: Atherosclerosis is seen. Skull: Intact.  No focal lesion. Sinuses/Orbits: Negative. Other: None. IMPRESSION: No acute abnormality. Stable appearance of atrophy and chronic microvascular ischemic change. Atherosclerosis. Electronically Signed   By: Inge Rise M.D.   On: 02/21/2018 14:22    Subjective: Reluctantly agrees to go to psychiatric hospital voluntarily. Denies SI  Discharge Exam: Vitals:   02/26/18 0819 02/26/18 1209  BP: 130/75 129/74  Pulse: 94 95  Resp: 18 20  Temp:  98.4 F (36.9 C)  SpO2: 96% 97%   No distress sitting in chair Nonlabored No JVD or edema Alert, oriented, no focal deficits Judgment poor, insight limited, mood depressed with constricted affect.   Labs: BNP (last 3 results) No results for input(s): BNP in the last 8760 hours. Basic Metabolic  Panel: Recent Labs  Lab 02/21/18 1404 02/21/18 1729 02/22/18 0543 02/23/18 0438  NA 139  --  138 137  K 4.4  --  4.2 4.1  CL 107  --  110 108  CO2 21*  --  17* 22  GLUCOSE 72  --  55* 77  BUN 36*  --  24* 14  CREATININE 1.03* 0.87 0.70 0.68  CALCIUM 8.8*  --  7.7* 7.6*   Liver Function Tests: Recent Labs  Lab 02/21/18 1404 02/22/18 0543  AST 35 48*  ALT 23 24  ALKPHOS 97 72  BILITOT 0.7 0.6  PROT 7.5 5.5*  ALBUMIN 4.1 3.1*   No results for input(s): LIPASE, AMYLASE in the last 168 hours. No results for input(s): AMMONIA in the last 168 hours. CBC: Recent Labs  Lab 02/21/18 1404 02/21/18 1729 02/22/18 0543 02/23/18 0438 02/24/18 0538  WBC 21.9* 22.4* 16.1* 11.5* 10.8*  NEUTROABS 19.1*  --   --   --   --   HGB 13.5 12.4 11.3* 11.1* 11.2*  HCT 44.0 39.8 36.5 34.3* 35.9*  MCV 93.0 93.0 93.4 91.7 91.6  PLT 327 304 258 224 257   Cardiac Enzymes: Recent Labs  Lab 02/21/18 1729 02/22/18 0543 02/23/18 0438 02/24/18 0538  CKTOTAL 2,249* 1,745* 709* 411*   BNP: Invalid input(s): POCBNP CBG: No results for input(s): GLUCAP in the last 168 hours. D-Dimer No results for input(s): DDIMER in the last 72 hours. Hgb A1c No results for input(s): HGBA1C in the last 72 hours. Lipid Profile No results for input(s): CHOL, HDL, LDLCALC, TRIG, CHOLHDL, LDLDIRECT in the last 72 hours. Thyroid function studies No results for input(s): TSH, T4TOTAL, T3FREE, THYROIDAB in the last 72 hours.  Invalid input(s): FREET3 Anemia work up No results for input(s): VITAMINB12, FOLATE, FERRITIN, TIBC, IRON, RETICCTPCT in the last 72 hours. Urinalysis    Component Value Date/Time   COLORURINE YELLOW 02/21/2018 1630   APPEARANCEUR CLEAR 02/21/2018 1630   LABSPEC 1.020 02/21/2018 1630   PHURINE 5.0 02/21/2018 1630   GLUCOSEU NEGATIVE 02/21/2018 1630   HGBUR NEGATIVE 02/21/2018 1630   BILIRUBINUR NEGATIVE 02/21/2018 1630   KETONESUR 20 (A) 02/21/2018 1630   PROTEINUR NEGATIVE  02/21/2018 1630   UROBILINOGEN 1.0 07/12/2012 1842   NITRITE NEGATIVE 02/21/2018 1630   LEUKOCYTESUR NEGATIVE 02/21/2018 1630    Microbiology No results found for this or any previous visit (from the past 240 hour(s)).  Time coordinating discharge: Approximately 40 minutes  Patrecia Pour, MD  [facility] 02/26/2018, 1:47 PM Pager [phone]

## 2018-02-26 NOTE — Progress Notes (Signed)
Physical Therapy Treatment Patient Details Name: Lori Butler MRN: [id number] DOB: 1950-05-26 Today's Date: 02/26/2018    History of Present Illness 67 y.o. female, with history of COPD, mild depression, osteoporosis, GERD who lives at home by herself, has been having issues with some generalized weakness and deconditioning since her last admission to the hospital few months ago for COPD exacerbation.  Admitted for Wellbutrin overdose, leukocytosis, severe dehydration.      PT Comments    Pt progressing well towards physical therapy goals. Focus of session was therapeutic exercise (seated and standing), and gait training. Tolerance for functional activity appears to be improving as we were able to ambulate ~100 feet more today than yesterday. HR increased to 125 bpm after standing exercise, and 127 bpm after ambulation. Pt was instructed in pursed lip breathing to maintain a lower HR during activity. Noted recommendation for inpatient psych, however pt states she wishes to return home. Will continue to follow and progress as able per POC.    Follow Up Recommendations  Other (comment)(Inpatient psych)     Equipment Recommendations  None recommended by PT    Recommendations for Other Services       Precautions / Restrictions Precautions Precautions: Fall Restrictions Weight Bearing Restrictions: No    Mobility  Bed Mobility               General bed mobility comments: Pt was sitting up in recliner chair upon PT arrival.   Transfers Overall transfer level: Needs assistance Equipment used: None Transfers: Sit to/from Stand Sit to Stand: Supervision         General transfer comment: Pt demonstrated proper hand placement on seated surface for safety. No assist required to power up to full stand.   Ambulation/Gait Ambulation/Gait assistance: Supervision Gait Distance (Feet): 220 Feet Assistive device: None Gait Pattern/deviations: Step-through pattern;Decreased stride  length Gait velocity: decreased Gait velocity interpretation: 1.31 - 2.62 ft/sec, indicative of limited community ambulator General Gait Details: Slow but generally steady. Pt took a few standing rest breaks against the railings but no assist was required.    Stairs             Wheelchair Mobility    Modified Rankin (Stroke Patients Only)       Balance Overall balance assessment: Needs assistance Sitting-balance support: Feet supported;No upper extremity supported Sitting balance-Leahy Scale: Fair     Standing balance support: Bilateral upper extremity supported;During functional activity Standing balance-Leahy Scale: Fair                              Cognition Arousal/Alertness: Awake/alert Behavior During Therapy: WFL for tasks assessed/performed;Flat affect Overall Cognitive Status: Within Functional Limits for tasks assessed                                        Exercises General Exercises - Lower Extremity Long Arc Quad: 15 reps;Both Hip ABduction/ADduction: 10 reps;Standing;Both Straight Leg Raises: 10 reps;Standing;Both Toe Raises: 10 reps;Standing;Both Heel Raises: 10 reps;Standing;Both Mini-Sqauts: 10 reps;Standing;Both Other Exercises Other Exercises: Standing hamstring curls x10 reps each side    General Comments        Pertinent Vitals/Pain Pain Assessment: No/denies pain    Home Living                      Prior Function  PT Goals (current goals can now be found in the care plan section) Acute Rehab PT Goals 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 : None Difficulty sitting down on and standing up from a chair with arms (e.g., wheelchair, bedside commode, etc,.)?: None Help needed moving to and from a bed to chair (including a wheelchair)?: A Little Help needed walking in hospital room?: A Little Help needed climbing 3-5 steps with a railing? : A Little 6 Click Score: 21    End of Session Equipment Utilized During Treatment: Gait belt Activity Tolerance: Patient tolerated treatment well Patient left: in chair;with call bell/phone within reach;with nursing/sitter in room(Sitter) Nurse Communication: Mobility status PT Visit Diagnosis: Muscle weakness (generalized) (M62.81);Difficulty in walking, not elsewhere classified (R26.2);Other abnormalities of gait and mobility (R26.89)     Time: 9532-0233 PT Time Calculation (min) (ACUTE ONLY): 20 min  Charges:  $Gait Training: 8-22 mins $Therapeutic Exercise: 8-22 mins                     Rolinda Roan, PT, DPT Acute Rehabilitation Services Pager: [phone] Office: [phone]    Lori Butler 02/26/2018, 10:53 AM

## 2018-02-26 NOTE — Progress Notes (Signed)
PROGRESS NOTE  Lori Butler  AUQ:[id number] DOB: Dec 03, 1950 DOA: 02/21/2018 PCP: [name], MD   Brief Narrative: Lori Butler is a 67 y.o. female with a history of COPD, former tobacco use, GERD, and depression who presented to the ED with weakness found down at home alone after intentional overdose with wellbutrin (40 tablets of 150mg ) in suicide attempt. She reported stopping smoking in May and having terrible health ever since including hospitalizations for COPD exacerbation and progressive generalized weakness and deconditioning causing her to miss her part time work at [employer] which has caused financial stress. She was found by her family on her couch confused and weak, having sat there for 24-48 hours with limited memory of events following overdose. In the ED the patient appeared dehydrated, tachycardic with mild leukocytosis and negative infectious work up. CK noted to be 2249 and patient noted to have 555cc retained urine. QTc 459msec. Poison control was consulted, foley placed, IV fluids given and she was admitted for monitoring, psychiatry evaluation, and treatment of rhabdomyolysis. CK, leukocytosis, and tremors have improved. Psychiatry has recommended brief inpatient psychiatric hospitalization when medically stable.  Assessment & Plan: Principal Problem:   Attempted suicide [facility]) Active Problems:   COPD (chronic obstructive pulmonary disease) ([place])   Tobacco abuse   Physical deconditioning   Major depressive disorder, single episode, severe ([place])  Suicide attempt by overdose of wellbutrin: - Psychiatry evaluated patient 10/18 recommending transfer to psychiatric inpatient for treatment. Patient initially agreed on voluntary basis and has since declined to go. Psychiatry reevaluated 10/20 feels IVC would be required and continues to recommend inpatient psychiatric care. Discussed with patient today. 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    Wellbutrin toxicity due to overdose: 150mg  24hr formulation x40 tabs per patient. Pt had tremors but no seizure like activity. Resolved. QRS and QT intervals stable so telemetry discontinued.  Acute urinary retention: As result of OD. Resolved 10/19.  Rhabdomyolysis: Nontraumatic, improved. CK 2249 >> 411   Dysphagia: Cleared by SLP.  Generalized weakness: Improving. - PT/OT.   Sinus tachycardia: Stable. TSH 1.7. Due to wellbutrin, resolved.  COPD: Followed in [place] clinic. PFTs 10/2017-moderate airway obstruction, ratio 59, FEV1 51%, FVC 65%, no significant BD response, DLCO 57% - Continue home medications. continues to have no evidence of distress.  GERD:  - Continue PPI  Leukocytosis: Suspected stress demargination in the absence of fever, localizing infectious symptoms. Also possibly related to prednisone prescribed by pulm earlier this month. - Improved without Tx.  DVT prophylaxis: Heparin Code Status: Full Family Communication: None at bedside Disposition Plan: Inpatient psychiatric hospital when bed available, would need IVC paperwork if patient refuses further inpatient treatment. She is medically stable for discharge.  Consultants:   Psychiatry  Poison control  Procedures:   None  Antimicrobials:  None   Subjective: Reluctantly agrees to go to psychiatric hospital voluntarily. Denies SI  Objective: Vitals:   02/26/18 0550 02/26/18 0803 02/26/18 0819 02/26/18 1209  BP: 119/65  130/75 129/74  Pulse: 91  94 95  Resp: 16  18 20   Temp: 98.3 F (36.8 C)   98.4 F (36.9 C)  TempSrc: Oral   Oral  SpO2: 96% 96% 96% 97%   No distress sitting in chair Nonlabored No JVD or edema Alert, oriented, no focal deficits Judgment poor, insight limited, mood depressed with constricted affect.   CBC: Recent Labs  Lab 02/21/18 1404 02/21/18 1729 02/22/18 0543 02/23/18 0438 02/24/18 0538  WBC 21.9* 22.4* 16.1* 11.5*  10.8*  NEUTROABS 19.1*  --   --    --   --   HGB 13.5 12.4 11.3* 11.1* 11.2*  HCT 44.0 39.8 36.5 34.3* 35.9*  MCV 93.0 93.0 93.4 91.7 91.6  PLT 327 304 258 224 881   Basic Metabolic Panel: Recent Labs  Lab 02/21/18 1404 02/21/18 1729 02/22/18 0543 02/23/18 0438  NA 139  --  138 137  K 4.4  --  4.2 4.1  CL 107  --  110 108  CO2 21*  --  17* 22  GLUCOSE 72  --  55* 77  BUN 36*  --  24* 14  CREATININE 1.03* 0.87 0.70 0.68  CALCIUM 8.8*  --  7.7* 7.6*   Cardiac Enzymes: Recent Labs  Lab 02/21/18 1729 02/22/18 0543 02/23/18 0438 02/24/18 0538  CKTOTAL 2,249* 1,745* 709* 411*    Time spent: 15 minutes.  Patrecia Pour, MD [facility] [email or web address] Password [email or web address] 02/26/2018, 1:19 PM

## 2018-02-26 NOTE — Progress Notes (Signed)
Pt alert & oriented x4. Respiration even and unlabored.  No c/o pain or discomfort.  Afrebrile.IV removed. Paperwork given to transporter. NT took pt via wheelchair to transportation.

## 2018-02-26 NOTE — Progress Notes (Signed)
D: Received patient from [facility] with report received from [facility]. Patient skin assessment completed with Modena Slater, RN, skin is intact, besides a level 1 pressure ulcer on her buttocks that was present prior to admission and was previously dressed with foam adhesive bandaging. Upon assessment of level 1 pressure ulcer skin is observed with generalized redness in the buttocks region with a small blister present on the left side of buttocks region. Foam adhesive bandage put back into place after assessments for protection of skin. Pt. Is unable to identify how pressure ulcer and blister formed stating, "I'm able to walk around fine". Pt. Upon search had no contraband found. All unit prohibited items locked away for discharge per policy. Pt. Was admitted under the services of, Dr. Wonda Olds.   Pt. During the admissions process noticeably anxious, but pleasant and cooperative. Pt. Engages actively during interviewing. Pt. Given extensive admissions education and orientation. Pt. Reports that she has been severely depressed about her diagnosis of COPD and asthma and reportedly took, "a bunch of my Wellbutrin medication" to kill herself and was found by EMS down and shortly after rushed to the hospital. Pt. Reports that, "I didn't want to be a burden anymore to my family". Pt. During interviewing adamantly denies current suicidal and homicidal ideations and reports, "I realize what I did was incredibly selfish and a horrible thing to do to my family". Pt. Verbally is able to contract for safety. Pt. Denies pain. Pt. Given PRN medications per on call MD as well as patient request to comfort anxieties with good results.    A: Patient oriented to unit/room/call light.Patient was encourage to participate in unit activities and continue with plan of care. Q x 15 minute observation checks were initiated for safety. Pt. Given fluids to drink and offered a meal, but declined meal.  R: Patient is receptive to  treatment plan developed with patient direct input.

## 2018-02-27 ENCOUNTER — Other Ambulatory Visit: Payer: Self-pay | Admitting: *Deleted

## 2018-02-27 DIAGNOSIS — F332 Major depressive disorder, recurrent severe without psychotic features: Principal | ICD-10-CM

## 2018-02-27 LAB — HEMOGLOBIN A1C
Hgb A1c MFr Bld: 5 % (ref 4.8–5.6)
Mean Plasma Glucose: 96.8 mg/dL

## 2018-02-27 LAB — LIPID PANEL
CHOL/HDL RATIO: 3.9 ratio
Cholesterol: 184 mg/dL (ref 0–200)
HDL: 47 mg/dL (ref >40)
LDL Cholesterol: 115 mg/dL — ABNORMAL HIGH (ref 0–99)
Triglycerides: 112 mg/dL (ref <150)
VLDL: 22 mg/dL (ref 0–40)

## 2018-02-27 LAB — TSH: TSH: 3.484 u[IU]/mL (ref 0.350–4.500)

## 2018-02-27 MED ORDER — TIOTROPIUM BROMIDE MONOHYDRATE 18 MCG IN CAPS
18.0000 ug | ORAL_CAPSULE | Freq: Every day | RESPIRATORY_TRACT | Status: DC
  Administered 2018-02-28: 18 ug via RESPIRATORY_TRACT
  Filled 2018-02-27: qty 5

## 2018-02-27 MED ORDER — GUAIFENESIN ER 600 MG PO TB12
600.0000 mg | ORAL_TABLET | Freq: Two times a day (BID) | ORAL | Status: DC
  Administered 2018-02-27 – 2018-02-28 (×3): 600 mg via ORAL
  Filled 2018-02-27 (×4): qty 1

## 2018-02-27 MED ORDER — GUAIFENESIN ER 600 MG PO TB12
1200.0000 mg | ORAL_TABLET | Freq: Two times a day (BID) | ORAL | Status: DC
  Filled 2018-02-27 (×2): qty 2

## 2018-02-27 MED ORDER — ALBUTEROL SULFATE (2.5 MG/3ML) 0.083% IN NEBU: 2.5000 mg | INHALATION_SOLUTION | RESPIRATORY_TRACT | Status: DC | PRN

## 2018-02-27 MED ORDER — MONTELUKAST SODIUM 10 MG PO TABS
10.0000 mg | ORAL_TABLET | Freq: Every day | ORAL | Status: DC
  Administered 2018-02-27: 10 mg via ORAL
  Filled 2018-02-27: qty 1

## 2018-02-27 MED ORDER — ALENDRONATE SODIUM 10 MG PO TABS: 70.0000 mg | ORAL_TABLET | ORAL | Status: DC

## 2018-02-27 MED ORDER — VITAMIN D 1000 UNITS PO TABS
1000.0000 [IU] | ORAL_TABLET | Freq: Every day | ORAL | Status: DC
  Administered 2018-02-27 – 2018-02-28 (×2): 1000 [IU] via ORAL
  Filled 2018-02-27 (×2): qty 1

## 2018-02-27 MED ORDER — SERTRALINE HCL 25 MG PO TABS
50.0000 mg | ORAL_TABLET | Freq: Every day | ORAL | Status: DC
  Administered 2018-02-27 – 2018-02-28 (×2): 50 mg via ORAL
  Filled 2018-02-27 (×2): qty 2

## 2018-02-27 MED ORDER — ALBUTEROL SULFATE HFA 108 (90 BASE) MCG/ACT IN AERS
1.0000 | INHALATION_SPRAY | Freq: Four times a day (QID) | RESPIRATORY_TRACT | Status: DC | PRN
  Filled 2018-02-27: qty 6.7

## 2018-02-27 NOTE — [facility] Group Notes (Addendum)
Patient after her assessment was encouraged to attend group and she did.  Agostino Gorin LCSW

## 2018-02-27 NOTE — Tx Team (Addendum)
Interdisciplinary Treatment and Diagnostic Plan Update  02/27/2018 Time of Session: 11am Lori Butler MRN: [id number]  Principal Diagnosis: <principal problem not specified>  Secondary Diagnoses: Active Problems:   Major depressive disorder, recurrent severe without psychotic features ([place])   Current Medications:  Current Facility-Administered Medications  Medication Dose Route Frequency Provider Last Rate Last Dose  . acetaminophen (TYLENOL) tablet 650 mg  650 mg Oral Q6H PRN Pucilowska, Jolanta B, MD      . albuterol (PROVENTIL HFA;VENTOLIN HFA) 108 (90 Base) MCG/ACT inhaler 1-2 puff  1-2 puff Inhalation Q6H PRN McNew, Holly R, MD      . albuterol (PROVENTIL) (2.5 MG/3ML) 0.083% nebulizer solution 2.5 mg  2.5 mg Nebulization Q4H PRN McNew, Tyson Babinski, MD      . alum & mag hydroxide-simeth (MAALOX/MYLANTA) 200-200-20 MG/5ML suspension 30 mL  30 mL Oral Q4H PRN Pucilowska, Jolanta B, MD      . guaiFENesin (MUCINEX) 12 hr tablet 600 mg  600 mg Oral BID McNew, Holly R, MD      . LORazepam (ATIVAN) tablet 1 mg  1 mg Oral Q4H PRN Clapacs, Madie Reno, MD   1 mg at 02/26/18 2207  . magnesium hydroxide (MILK OF MAGNESIA) suspension 30 mL  30 mL Oral Daily PRN Pucilowska, Jolanta B, MD      . montelukast (SINGULAIR) tablet 10 mg  10 mg Oral QHS McNew, Holly R, MD      . pantoprazole (PROTONIX) EC tablet 40 mg  40 mg Oral Daily Pucilowska, Jolanta B, MD   40 mg at 02/27/18 0927  . sertraline (ZOLOFT) tablet 50 mg  50 mg Oral Daily McNew, Holly R, MD      . tiotropium [facility]) inhalation capsule ([facility] use ONLY) 18 mcg  18 mcg Inhalation Daily McNew, Holly R, MD      . traZODone (DESYREL) tablet 100 mg  100 mg Oral QHS PRN Pucilowska, Jolanta B, MD       PTA Medications: Medications Prior to Admission  Medication Sig Dispense Refill Last Dose  . acetaminophen (TYLENOL) 500 MG tablet Take 1,000 mg by mouth every 6 (six) hours as needed for mild pain or headache.   Past Week at Unknown time  .  albuterol (PROVENTIL HFA;VENTOLIN HFA) 108 (90 Base) MCG/ACT inhaler Inhale 2 puffs into the lungs every 6 (six) hours as needed for wheezing or shortness of breath. 1 Inhaler 2 02/18/2018  . albuterol (PROVENTIL) (2.5 MG/3ML) 0.083% nebulizer solution Take 3 mLs (2.5 mg total) by nebulization every 4 (four) hours as needed for wheezing or shortness of breath. 75 mL 12 unknown  . alendronate (FOSAMAX) 70 MG tablet Take 70 mg by mouth every Wednesday.    Past Week at Unknown time  . budesonide-formoterol (SYMBICORT) 160-4.5 MCG/ACT inhaler Inhale 2 puffs into the lungs 2 (two) times daily. 1 Inhaler 12 unknown  . guaiFENesin (MUCINEX) 600 MG 12 hr tablet Take 2 tablets (1,200 mg total) by mouth 2 (two) times daily. (Patient taking differently: Take 600 mg by mouth 2 (two) times daily. ) 20 tablet 0 unknown  . montelukast (SINGULAIR) 10 MG tablet Take 1 tablet (10 mg total) by mouth at bedtime. 30 tablet 1 unknown  . pantoprazole (PROTONIX) 40 MG tablet Take 1 tablet (40 mg total) by mouth 2 (two) times daily. 60 tablet 1 unknown  . tiotropium (SPIRIVA HANDIHALER) 18 MCG inhalation capsule Place 1 capsule (18 mcg total) into inhaler and inhale daily. 30 capsule 11 Past Week at Unknown time  Patient Stressors: Health problems Other: quit smoking, depression  Patient Strengths: Ability for insight Average or above average intelligence Capable of independent living Occupational psychologist fund of knowledge Special hobby/interest Supportive family/friends Work skills  Treatment Modalities: Medication Management, Group therapy, Case management,  1 to 1 session with clinician, Psychoeducation, Recreational therapy.   Physician Treatment Plan for Primary Diagnosis: <principal problem not specified> Long Term Goal(s):     Short Term Goals:    Medication Management: Evaluate patient's response, side effects, and tolerance of medication regimen.  Therapeutic  Interventions: 1 to 1 sessions, Unit Group sessions and Medication administration.  Evaluation of Outcomes: Progressing  Physician Treatment Plan for Secondary Diagnosis: Active Problems:   Major depressive disorder, recurrent severe without psychotic features ([place])  Long Term Goal(s):     Short Term Goals:       Medication Management: Evaluate patient's response, side effects, and tolerance of medication regimen.  Therapeutic Interventions: 1 to 1 sessions, Unit Group sessions and Medication administration.  Evaluation of Outcomes: Progressing   RN Treatment Plan for Primary Diagnosis: <principal problem not specified> Long Term Goal(s): Knowledge of disease and therapeutic regimen to maintain health will improve  Short Term Goals: Ability to demonstrate self-control, Ability to identify and develop effective coping behaviors will improve and Compliance with prescribed medications will improve  Medication Management: RN will administer medications as ordered by provider, will assess and evaluate patient's response and provide education to patient for prescribed medication. RN will report any adverse and/or side effects to prescribing provider.  Therapeutic Interventions: 1 on 1 counseling sessions, Psychoeducation, Medication administration, Evaluate responses to treatment, Monitor vital signs and CBGs as ordered, Perform/monitor CIWA, COWS, AIMS and Fall Risk screenings as ordered, Perform wound care treatments as ordered.  Evaluation of Outcomes: Progressing   LCSW Treatment Plan for Primary Diagnosis: <principal problem not specified> Long Term Goal(s): Safe transition to appropriate next level of care at discharge, Engage patient in therapeutic group addressing interpersonal concerns.  Short Term Goals: Engage patient in aftercare planning with referrals and resources, Increase social support, Identify triggers associated with mental health/substance abuse issues and Increase  skills for wellness and recovery  Therapeutic Interventions: Assess for all discharge needs, 1 to 1 time with Social worker, Explore available resources and support systems, Assess for adequacy in community support network, Educate family and significant other(s) on suicide prevention, Complete Psychosocial Assessment, Interpersonal group therapy.  Evaluation of Outcomes: Progressing   Progress in Treatment: Attending groups: No. Participating in groups: No. Taking medication as prescribed: Yes. Toleration medication: Yes. Family/Significant other contact made: No, will contact:  Family member if patient consets Patient understands diagnosis: Yes. Discussing patient identified problems/goals with staff: Yes. Medical problems stabilized or resolved: Yes. Denies suicidal/homicidal ideation: Yes. Issues/concerns per patient self-inventory: Yes. Other:    New problem(s) identified: No, Describe:  None  New Short Term/Long Term Goal(s): "To get back to working at least 1-2 days a week."    Patient Goals:  :To leave."  Discharge Plan or Barriers: Patient will return home and follow up with [facility] in [place].  Reason for Continuation of Hospitalization: Medication stabilization  Estimated Length of Stay: 1-2 days   Recreational Therapy: Patient Stressors: N/A Patient Goal: Patient will identify 3 positive coping skills strategies to use for SI post d/c within 5 recreation therapy group sessions  Attendees: Patient: Lori Butler 02/27/2018 11:40 AM  Physician: Amador Cunas, MD 02/27/2018 11:40 AM  Nursing:  02/27/2018 11:40 AM  RN  Care Manager: 02/27/2018 11:40 AM  Social Worker: Darin Engels, [place] 02/27/2018 11:40 AM  Recreational Therapist: Isaias Sakai. Jann Milkovich CTRS, LRT 02/27/2018 11:40 AM  Other: Sanjuana Kava, LCSW 02/27/2018 11:40 AM  Other:  02/27/2018 11:40 AM  Other: 02/27/2018 11:40 AM    Scribe for Treatment Team: Darin Engels,  LCSW 02/27/2018 11:40 AM

## 2018-02-27 NOTE — BHH Group Notes (Signed)
[facility] Group Notes:  (Nursing/MHT/Case Management/Adjunct)  Date:  02/27/2018  Time:  9:20 PM  Type of Therapy:  Group Therapy  Participation Level:  Active  Participation Quality:  Attentive  Affect:  Appropriate  Cognitive:  Oriented  Insight:  Good  Engagement in Group:  Engaged  Modes of Intervention:  Discussion  Summary of Progress/Problems: Group content was focused on communication with people that are  different from you, the group identifies important parts of communication, patient was able to participate and expressed her thoughts to apply positive feelings towards people . Clemens Catholic 02/27/2018, 9:20 PM

## 2018-02-27 NOTE — Progress Notes (Signed)
PHARMACIST - PHYSICIAN COMMUNICATION  CONCERNING: P&T Medication Policy Regarding Oral Bisphosphonates  RECOMMENDATION: Your order for alendronate (Fosamax) has been discontinued at this time.  If the patient's post-hospital medical condition warrants safe use of this class of drugs, please resume the pre-hospital regimen upon discharge.  DESCRIPTION:  Alendronate (Fosamax) can cause severe esophageal erosions in patients who are unable to remain upright at least 30 minutes after taking this medication.   Since brief interruptions in therapy are thought to have minimal impact on bone mineral density, the [place] has established that bisphosphonate orders should be routinely discontinued during hospitalization.   To override this safety policy and permit administration of Fosamax in the hospital, prescribers must write "DO NOT HOLD" in the comments section when placing the order for this class of medications.

## 2018-02-27 NOTE — Progress Notes (Addendum)
Received Lori Butler this AM in her room after breakfast, she was compliant with her medication. She denied all of the psychiatric symptoms and stated she feels better this AM. On her self inventory sheet she rated depression and hopelessness 9/10. Anxiety was rated 8/10. Her affect remains flat and sad. She was OOB for the 1300 hrs group therapy session.  She received visitors this PM and her affect was brighter.

## 2018-02-27 NOTE — BHH Suicide Risk Assessment (Signed)
[place] INPATIENT:  Family/Significant Other Suicide Prevention Education  Suicide Prevention Education:  Education Completed;Lori Butler [phone] has been identified by the patient as the family member/significant other with whom the patient will be residing, and identified as the person(s) who will aid the patient in the event of a mental health crisis (suicidal ideations/suicide attempt).  With written consent from the patient, the family member/significant other has been provided the following suicide prevention education, prior to the and/or following the discharge of the patient.  The suicide prevention education provided includes the following:  Suicide risk factors  Suicide prevention and interventions  National Suicide Hotline telephone number  [facility] assessment telephone number  [facility] Emergency Assistance [place] and/or Residential Mobile Crisis Unit telephone number  Request made of family/significant other to:  Remove weapons (e.g., guns, rifles, knives), all items previously/currently identified as safety concern.    Remove drugs/medications (over-the-counter, prescriptions, illicit drugs), all items previously/currently identified as a safety concern.  The family member/significant other verbalizes understanding of the suicide prevention education information provided.  The family member/significant other agrees to remove the items of safety concern listed above.  Lori Butler Butler 02/27/2018, 12:33 PM

## 2018-02-27 NOTE — BHH Suicide Risk Assessment (Signed)
[facility] Admission Suicide Risk Assessment   Nursing information obtained from:  Patient, Review of record Demographic factors:  Age 67 or older, Caucasian, Unemployed, Living alone Current Mental Status:  NA Loss Factors:  Decline in physical health Historical Factors:  Prior suicide attempts Risk Reduction Factors:  Positive social support, Positive therapeutic relationship, Positive coping skills or problem solving skills, Sense of responsibility to family  Total Time spent with patient: 1 hour Principal Problem: Major depressive disorder, recurrent severe without psychotic features ([place]) Diagnosis:   Patient Active Problem List   Diagnosis Date Noted  . Major depressive disorder, recurrent severe without psychotic features ([place]) [F33.2] 02/24/2018    Priority: High  . Attempted suicide ([place]) [T14.91XA] 02/21/2018  . Hypoxia [R09.02]   . Physical deconditioning [R53.81]   . COPD exacerbation ([place]) [J44.1]   . Vitamin B12 deficiency [E53.8] 11/10/2017  . Osteoporosis [M81.0] 11/10/2017  . Vitamin D deficiency [E55.9] 11/10/2017  . Tobacco abuse [Z72.0] 09/20/2017  . COPD (chronic obstructive pulmonary disease) ([place]) [J44.9] 09/15/2017   Subjective Data: See H&P  Continued Clinical Symptoms:  Alcohol Use Disorder Identification Test Final Score (AUDIT): 0 The "Alcohol Use Disorders Identification Test", Guidelines for Use in Primary Care, Second Edition.  World Pharmacologist [facility]). Score between 0-7:  no or low risk or alcohol related problems. Score between 8-15:  moderate risk of alcohol related problems. Score between 16-19:  high risk of alcohol related problems. Score 20 or above:  warrants further diagnostic evaluation for alcohol dependence and treatment.   CLINICAL FACTORS:   Medical Diagnoses and Treatments/Surgeries   COGNITIVE FEATURES THAT CONTRIBUTE TO RISK:  None    SUICIDE RISK:   Minimal Acute Risk: No identifiable suicidal ideation.   PLAN OF CARE:  See H&P  I certify that inpatient services furnished can reasonably be expected to improve the patient's condition.   [name], MD 02/27/2018, 12:33 PM

## 2018-02-27 NOTE — BHH Group Notes (Signed)
  Emotional Regulation October 23 1PM  Type of Therapy/Topic:  Group Therapy:  Emotion Regulation  Participation Level: Good Participation  Description of Group:   The purpose of this group is to assist patients in learning to regulate negative emotions and experience positive emotions. Patients will be guided to discuss ways in which they have been vulnerable to their negative emotions. These vulnerabilities will be juxtaposed with experiences of positive emotions or situations, and patients will be challenged to use positive emotions to combat negative ones. Special emphasis will be placed on coping with negative emotions in conflict situations, and patients will process healthy conflict resolution skills.  Therapeutic Goals: 1. Patient will identify two positive emotions or experiences to reflect on in order to balance out negative emotions 2. Patient will label two or more emotions that they find the most difficult to experience 3. Patient will demonstrate positive conflict resolution skills through discussion and/or role plays  Summary of Patient Progress:  This patient was pleasant and able to follow content as her peers shared stories when having both positive and negative emotions. LCSW did touch upon how medical challenges can cause emotional challenges. She was able to understand her own limitations and how she will need to do other things instead.     Therapeutic Modalities:   Cognitive Behavioral Therapy Feelings Identification Dialectical Behavioral Therapy   Lori Denio LCSW 2:15 pm

## 2018-02-27 NOTE — BHH Counselor (Signed)
CSW sent off [facility] referral for the patient.  Darin Engels, MSW, Latanya Presser, [place] Clinical Social Worker 02/27/2018 2:11 PM

## 2018-02-27 NOTE — H&P (Addendum)
Psychiatric Admission Assessment Adult  Patient Identification: Lori Butler MRN:  [id number] Date of Evaluation:  02/27/2018 Chief Complaint:  Suicide attempt Principal Diagnosis: Major depressive disorder, recurrent severe without psychotic features ([place]) Diagnosis:   Patient Active Problem List   Diagnosis Date Noted  . Major depressive disorder, recurrent severe without psychotic features ([place]) [F33.2] 02/24/2018  . Attempted suicide ([place]) [T14.91XA] 02/21/2018  . Hypoxia [R09.02]   . Physical deconditioning [R53.81]   . COPD exacerbation ([place]) [J44.1]   . Vitamin B12 deficiency [E53.8] 11/10/2017  . Osteoporosis [M81.0] 11/10/2017  . Vitamin D deficiency [E55.9] 11/10/2017  . Tobacco abuse [Z72.0] 09/20/2017  . COPD (chronic obstructive pulmonary disease) ([place]) [J44.9] 09/15/2017   History of Present Illness: 67 yo female admitted after suicide attempt by overdosing on Wellbutrin. She was admitted to the medical floor at [facility]. She was dehydrated, tachycardic with leukocytosis. Work up for infection was negative. CK was quite elevated but has trended down. She also had urinary retention which has now resolved. Psychiatry was consulted and recommended transfer to inpatient psychiatric hospitalization.  Upon evaluation today, pt was very calm and cooperative. She is forthcoming with information. She states that she quit smoking in May and "since then my health has declined a lot." She states that she was diagnosed with COPD after that and "went into respiratory failure twice." She sees a pulmonologist regularly, Dr. Emogene Morgan. She states that she has been doing her daily exercises regularly to try to get her lung function up. She walks the aisles of the grocery store daily to help with this. She states that she has always been very independent and able to take care of herself. She states that she has felt like a burden to her son because he checks on her daily. She states that she felt  guilty for him having to do this because he has a family. She states that she was initially on Wellbutrin to help her stop smoking but asked to be back on it "to help me cope with all of this." She denies feelings of persistent depression but admits she was under a lot of stress with these new health issues. She does have a lot of anxiety. She adamantly denies any past suicidal thoughts. She states that the weekend this happened she was feeling pretty good. She states that she was going about her normal routine, doing her daily exercises. She then went to bed. She states that in the morning she impulsively took an overdose of Wellbutrin. She does not recall how many she took but "people told me it was around 40." She adamantly denies that this was something she was thinking of doing prior to doing it. She denies researching ways to kill herself. When asked why she chose Wellbutrin, she states, "Because that's what I had."  She denies ever having suicidal thoughts in the past. She states, "that was not me at all. I wonder if I had an infection or something because I normally would never do something like that." She states that she is extremely happy to be alive. She is tearful stating that she feels very guilty for hurting her family especially her son and daughter in law.  She states "I defiantly regret doing it." She denies SI or any thoughts of self harm. She states, "I feel so ashamed for doing that." She states that she looks forward to continuing to be in her son and daughter in laws life. She is very close to her 3 granddaughter and  wants to be in their life. She gets to see them at least once a week. She also wants to try to get another part time job to stay busy. She was working at [employer] and really enjoyed it. She has some friends through work that she talks to. She states that overall she sleeps generally well. She has always woken up really early I in the morning for work so still does this to get her  routine started. She eats well. Denies any history of manic symptoms or psychosis. Pt states that she is open to seeing a therapist and a psychiatrist.    I spoke with her son, Tommi Rumps with her permission. He states that this was a shock for him. He states that her medical issues were likely taking a toll on her. He states that she does struggle with some anxiety. We discussed that she is open to seeing a therapist and he is glad to hear this. He states that he plans to come to see her and talk with her today to make sure she is going to follow through with recommendations. He is very supportive of her.   Associated Signs/Symptoms: Depression Symptoms:  depressed mood, feelings of worthlessness/guilt, suicidal attempt, (Hypo) Manic Symptoms:  Denies any hsitory of mania Anxiety Symptoms:  Excessive Worry, Psychotic Symptoms:  Denies AH, VH, [place] PTSD Symptoms: Negative Total Time spent with patient: 1 hour  Past Psychiatric History: No past psychiatric history. Has never seen a psychiatrist or therapist. She denies past inpatient psychiatric admissions. Denies past suicide attempts. Wellbutrin was the only past medication she has been on for smoking cessation.   Is the patient at risk to self? Yes.    Has the patient been a risk to self in the past 6 months? Yes.    Has the patient been a risk to self within the distant past? No.  Is the patient a risk to others? No.  Has the patient been a risk to others in the past 6 months? No.  Has the patient been a risk to others within the distant past? No.   Alcohol Screening: 1. How often do you have a drink containing alcohol?: Never 2. How many drinks containing alcohol do you have on a typical day when you are drinking?: 1 or 2 3. How often do you have six or more drinks on one occasion?: Never AUDIT-C Score: 0 4. How often during the last year have you found that you were not able to stop drinking once you had started?: Never 5. How often  during the last year have you failed to do what was normally expected from you becasue of drinking?: Never 6. How often during the last year have you needed a first drink in the morning to get yourself going after a heavy drinking session?: Never 7. How often during the last year have you had a feeling of guilt of remorse after drinking?: Never 8. How often during the last year have you been unable to remember what happened the night before because you had been drinking?: Never 9. Have you or someone else been injured as a result of your drinking?: No 10. Has a relative or friend or a doctor or another health worker been concerned about your drinking or suggested you cut down?: No Alcohol Use Disorder Identification Test Final Score (AUDIT): 0 Intervention/Follow-up: AUDIT Score <7 follow-up not indicated Substance Abuse History in the last 12 months:  No. Consequences of Substance Abuse: Negative Previous Psychotropic Medications:  Yes  Psychological Evaluations: No  Past Medical History:  Past Medical History:  Diagnosis Date  . Acute respiratory failure with hypoxia ([place])   . Anxiety   . Aortic atherosclerosis ([place])   . Arthritis    "a little in my hands probably" (10/31/2017)  . COPD (chronic obstructive pulmonary disease) ([place])   . COPD exacerbation ([place])   . Depression   . History of kidney stones   . Suicide attempt ([place]) 02/21/2018   overdosed on Wellbutrin    Past Surgical History:  Procedure Laterality Date  . ABDOMINAL HYSTERECTOMY    . APPENDECTOMY    . BREAST BIOPSY Left 1990s  . CATARACT EXTRACTION W/ INTRAOCULAR LENS  IMPLANT, BILATERAL Bilateral   . DILATION AND CURETTAGE OF UTERUS    . TONSILLECTOMY    . TUBAL LIGATION     Family History:  Family History  Problem Relation Age of Onset  . Breast cancer Mother    Family Psychiatric  History: Unknown Tobacco Screening: Have you used any form of tobacco in the last 30 days? (Cigarettes, Smokeless Tobacco,  Cigars, and/or Pipes): No Social History: Born in [place]. Lived there until her 28s. She moved to [place] with her husband at the time. She was married and divorced twice. She currently lives in an apartment alone in [place]. She has 2 adult children age 44 and 36. She is estranged from her daughter. She is very close to her son and daughter in law. She has 2 granddaughters ages 72, 5, and 31. She has worked in Librarian, academic. Most recent job was at [employer]. She states that her son is her primary support.   Social History   Substance and Sexual Activity  Alcohol Use Not Currently   Comment: 02/21/2018 "couple drinks/year"     Social History   Substance and Sexual Activity  Drug Use Never    Additional Social History: Marital status: Divorced What types of issues is patient dealing with in the relationship?: none Additional relationship information: no comment Are you sexually active?: No What is your sexual orientation?: heterosexual Has your sexual activity been affected by drugs, alcohol, medication, or emotional stress?: no                         Allergies:   Allergies  Allergen Reactions  . Demerol [Meperidine] Other (See Comments)    "horrible reaction"  . Oxycodone-Acetaminophen Other (See Comments)    Upset stomach   . Tramadol Nausea And Vomiting   Lab Results:  Results for orders placed or performed during the hospital encounter of 02/26/18 (from the past 48 hour(s))  Hemoglobin A1c     Status: None   Collection Time: 02/27/18  7:12 AM  Result Value Ref Range   Hgb A1c MFr Bld 5.0 4.8 - 5.6 %    Comment: (NOTE) Pre diabetes:          5.7%-6.4% Diabetes:              >6.4% Glycemic control for   <7.0% adults with diabetes    Mean Plasma Glucose 96.8 mg/dL    Comment: Performed at [facility], [place] [address] [place],  [address]  Lipid panel     Status: Abnormal   Collection Time: 02/27/18  7:12 AM  Result Value Ref Range   Cholesterol  184 0 - 200 mg/dL   Triglycerides 112 <150 mg/dL   HDL 47 >40 mg/dL   Total CHOL/HDL  Ratio 3.9 RATIO   VLDL 22 0 - 40 mg/dL   LDL Cholesterol 115 (H) 0 - 99 mg/dL    Comment:        Total Cholesterol/HDL:CHD Risk Coronary Heart Disease Risk Table                     Men   Women  1/2 Average Risk   3.4   3.3  Average Risk       5.0   4.4  2 X Average Risk   9.6   7.1  3 X Average Risk  23.4   11.0        Use the calculated Patient Ratio above and the CHD Risk Table to determine the patient's CHD Risk.        ATP III CLASSIFICATION (LDL):  <100     mg/dL   Optimal  100-129  mg/dL   Near or Above                    Optimal  130-159  mg/dL   Borderline  160-189  mg/dL   High  >190     mg/dL   Very High Performed at [facility], [address] [place], [place] [address]   TSH     Status: None   Collection Time: 02/27/18  7:12 AM  Result Value Ref Range   TSH 3.484 0.350 - 4.500 uIU/mL    Comment: Performed by a 3rd Generation assay with a functional sensitivity of <=0.01 uIU/mL. Performed at [facility], [address] [place], [place] [address]     Blood Alcohol level:  Lab Results  Component Value Date   [facility] <10 25/09/3974    Metabolic Disorder Labs:  Lab Results  Component Value Date   HGBA1C 5.0 02/27/2018   MPG 96.8 02/27/2018   MPG 96.8 12/10/2017   No results found for: PROLACTIN Lab Results  Component Value Date   CHOL 184 02/27/2018   TRIG 112 02/27/2018   HDL 47 02/27/2018   CHOLHDL 3.9 02/27/2018   VLDL 22 02/27/2018   LDLCALC 115 (H) 02/27/2018    Current Medications: Current Facility-Administered Medications  Medication Dose Route Frequency Provider Last Rate Last Dose  . acetaminophen (TYLENOL) tablet 650 mg  650 mg Oral Q6H PRN Pucilowska, Jolanta B, MD      . albuterol (PROVENTIL HFA;VENTOLIN HFA) 108 (90 Base) MCG/ACT inhaler 1-2 puff  1-2 puff Inhalation Q6H PRN Ameris Akamine R, MD      . albuterol (PROVENTIL) (2.5  MG/3ML) 0.083% nebulizer solution 2.5 mg  2.5 mg Nebulization Q4H PRN Ahni Bradwell, Tyson Babinski, MD      . alum & mag hydroxide-simeth (MAALOX/MYLANTA) 200-200-20 MG/5ML suspension 30 mL  30 mL Oral Q4H PRN Pucilowska, Jolanta B, MD      . guaiFENesin (MUCINEX) 12 hr tablet 600 mg  600 mg Oral BID Donnica Jarnagin R, MD      . LORazepam (ATIVAN) tablet 1 mg  1 mg Oral Q4H PRN Clapacs, Madie Reno, MD   1 mg at 02/26/18 2207  . magnesium hydroxide (MILK OF MAGNESIA) suspension 30 mL  30 mL Oral Daily PRN Pucilowska, Jolanta B, MD      . montelukast (SINGULAIR) tablet 10 mg  10 mg Oral QHS Juddson Cobern R, MD      . pantoprazole (PROTONIX) EC tablet 40 mg  40 mg Oral Daily Pucilowska, Jolanta B, MD   40 mg at 02/27/18  0927  . sertraline (ZOLOFT) tablet 50 mg  50 mg Oral Daily Boluwatife Mutchler, Tyson Babinski, MD   50 mg at 02/27/18 1152  . tiotropium (SPIRIVA) inhalation capsule ([facility] use ONLY) 18 mcg  18 mcg Inhalation Daily Yong Wahlquist R, MD      . traZODone (DESYREL) tablet 100 mg  100 mg Oral QHS PRN Pucilowska, Jolanta B, MD       PTA Medications: Medications Prior to Admission  Medication Sig Dispense Refill Last Dose  . acetaminophen (TYLENOL) 500 MG tablet Take 1,000 mg by mouth every 6 (six) hours as needed for mild pain or headache.   Past Week at Unknown time  . albuterol (PROVENTIL HFA;VENTOLIN HFA) 108 (90 Base) MCG/ACT inhaler Inhale 2 puffs into the lungs every 6 (six) hours as needed for wheezing or shortness of breath. 1 Inhaler 2 02/18/2018  . albuterol (PROVENTIL) (2.5 MG/3ML) 0.083% nebulizer solution Take 3 mLs (2.5 mg total) by nebulization every 4 (four) hours as needed for wheezing or shortness of breath. 75 mL 12 unknown  . alendronate (FOSAMAX) 70 MG tablet Take 70 mg by mouth every Wednesday.    Past Week at Unknown time  . budesonide-formoterol (SYMBICORT) 160-4.5 MCG/ACT inhaler Inhale 2 puffs into the lungs 2 (two) times daily. 1 Inhaler 12 unknown  . guaiFENesin (MUCINEX) 600 MG 12 hr tablet Take 2 tablets  (1,200 mg total) by mouth 2 (two) times daily. (Patient taking differently: Take 600 mg by mouth 2 (two) times daily. ) 20 tablet 0 unknown  . montelukast (SINGULAIR) 10 MG tablet Take 1 tablet (10 mg total) by mouth at bedtime. 30 tablet 1 unknown  . pantoprazole (PROTONIX) 40 MG tablet Take 1 tablet (40 mg total) by mouth 2 (two) times daily. 60 tablet 1 unknown  . tiotropium (SPIRIVA HANDIHALER) 18 MCG inhalation capsule Place 1 capsule (18 mcg total) into inhaler and inhale daily. 30 capsule 11 Past Week at Unknown time    Musculoskeletal: Strength & Muscle Tone: within normal limits Gait & Station: normal Patient leans: N/A  Psychiatric Specialty Exam: Physical Exam  Nursing note and vitals reviewed.   Review of Systems  All other systems reviewed and are negative.   Blood pressure 118/79, pulse 94, temperature 98.3 F (36.8 C), temperature source Oral, resp. rate 18, height 5\' 5"  (1.651 m), weight 68 kg, SpO2 94 %.Body mass index is 24.96 kg/m.  General Appearance: Casual  Eye Contact:  Fair  Speech:  Clear and Coherent  Volume:  Normal  Mood:  Sad  Affect:  Congruent  Thought Process:  Coherent and Goal Directed  Orientation:  Full (Time, Place, and Person)  Thought Content:  Logical  Suicidal Thoughts:  No  Homicidal Thoughts:  No  Memory:  Immediate;   Fair  Judgement:  Fair  Insight:  Fair  Psychomotor Activity:  Normal  Concentration:  Concentration: Fair  Recall:  [employer] of Knowledge:  Fair  Language:  Fair  Akathisia:  No      Assets:  Resilience Social Support  ADL's:  Intact  Cognition:  WNL  Sleep:  Number of Hours: 6.15    Treatment Plan Summary: 67 yo female admitted due to overdose on Wellbutrin. Pt denies past psychiatric history. She has been feeling overwhelmed related to recent diagnosis of COPD and has felt like a burden to her son because she is used to being so independent. She is adamant that this was an impulsive attempt and was not  something she  was planning to do. She feels extremely guilty and remorse for doing this. She is used to working and recently lost her job so this may have contributed to do lack of daily routine that she is used to> She wants to get a part time job again. She if future oriented and her family is extremely supportive. She is open to referrals to mental health providers and could benefit from a therapist.   Plan:  Depression -Start Zoloft 50 mg daily for depression and anxiety. This is a better option than Wellbutrin as she does have anxiety  COPD -She is on multiple inhalers. Will restart these. Spiriva, Symbicort, Albuterol  Vitamin D def -Restart Vitamin D  Dispo -She will return home on discharge. She will need mental health referrals. I spoke with her son today.   Labs reviewed, UA negative for infection, Lipid panel reviewed, normal TSH, EKG reviewed QTc 437  Observation Level/Precautions:  15 minute checks  Laboratory:  Done on Medical floor  Psychotherapy:    Medications:    Consultations:    Discharge Concerns:    Estimated LOS: 1-2 days  Other:     Physician Treatment Plan for Primary Diagnosis: Major depressive disorder, recurrent severe without psychotic features ([place]) Long Term Goal(s): Improvement in symptoms so as ready for discharge  Short Term Goals: Ability to disclose and discuss suicidal ideas   I certify that inpatient services furnished can reasonably be expected to improve the patient's condition.    [name], MD 10/23/201912:11 PM

## 2018-02-27 NOTE — Patient Outreach (Signed)
[facility]) Care Management  02/27/2018  Ahriana Gunkel 12/15/1950 [id number]   [place] Hospitalization/Discipline Closure  Referral Date:12/25/2017 Referral Source:Hospital Liaison Reason for Referral:Disease Management Education Insurance:[employer]   Outreach Attempt:  Patient discharged/transferred to [facility] for inpatient treatment.  Plan:  RN Health Coach will place [facility] Social Work referral for transition of care and assistance with depression.  RN Health Coach will send primary care provider Discipline Closure Letter.  RN Health Coach will close Disease Management Case.  [place] [phone] [email or web address]

## 2018-02-27 NOTE — Progress Notes (Signed)
Precautionary checks every 15 minutes for safety maintained, room free of safety hazards, patient sustains no injury or falls during this shift. Will endorse care to next shift.

## 2018-02-27 NOTE — Progress Notes (Signed)
Recreation Therapy Notes  INPATIENT RECREATION THERAPY ASSESSMENT  Patient Details Name: Naya Ilagan MRN: [id number] DOB: Aug 11, 1950 Today's Date: 02/27/2018       Information Obtained From: Patient  Able to Participate in Assessment/Interview: Yes  Patient Presentation: Responsive  Reason for Admission (Per Patient): Suicide Attempt, Suicidal Ideation  Patient Stressors:    Coping Skills:   Talk, Other (Comment)(family, friends)  Leisure Interests (2+):  Social - Family, Community - Industrial/product designer of Recreation/Participation: Monthly  Awareness of Community Resources:  Yes  Community Resources:  [employer]  Current Use: Yes  If no, Barriers?:    Expressed Interest in [place] of Residence:  [place]  Patient Main Form of Transportation: Musician  Patient Strengths:  Kind and understanding  Patient Identified Areas of Improvement:  Be more sociable  Patient Goal for Hospitalization:  To leave  Current SI (including self-harm):  No  Current HI:  No  Current AVH: No  Staff Intervention Plan: Group Attendance, Collaborate with Interdisciplinary Treatment Team  Consent to Intern Participation: N/A  Lelah Rennaker 02/27/2018, 1:53 PM

## 2018-02-27 NOTE — Progress Notes (Signed)
Recreation Therapy Notes  Date: 02/27/2018  Time: 9:30 am  Location: Craft Room  Behavioral response: Appropriate  Intervention Topic: Communication  Discussion/Intervention:  Group content today was focused on communication. The group defined communication and ways to communicate with others. Individuals stated reason why communication is important and some reasons to communicate with others. Patients expressed if they thought they were good at communicating with others and ways they could improve their communication skills. The group identified important parts of communication and some experiences they have had in the past with communication. The group participated in the intervention "What is that?", where they had a chance to test out their communication skills and identify ways to improve their communication techniques.  Clinical Observations/Feedback:  Patient came to group and explained that she writes in order to communicate with others. She explained that nonverbal communication is body language. Individual was social peers and staff while participating in the intervention. Abimbola Aki LRT/CTRS         Johne Buckle 02/27/2018 12:28 PM
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
## 2018-02-28 MED ORDER — SERTRALINE HCL 50 MG PO TABS: 50.0000 mg | ORAL_TABLET | Freq: Every day | ORAL | 0 refills | Status: AC

## 2018-02-28 NOTE — Progress Notes (Signed)
Patient alert and oriented x 4, denies SI/HI/AVH no distress noted, thoughts are organized and coherent, receptive to staff and interacting with peers and staff. patient attended evening wrap up group and was pleasant and  She participated appropriately 15 minutes safety checks maintained will continue to monitor.

## 2018-02-28 NOTE — Progress Notes (Signed)
  [facility] Adult Case Management Discharge Plan :  Will you be returning to the same living situation after discharge:  Yes,  Home with Son At discharge, do you have transportation home?: Yes,  Son coming at discharge Do you have the ability to pay for your medications: Yes,  Insurance  Release of information consent forms completed and in the chart;  Patient's signature needed at discharge.  Patient to Follow up at: Follow-up Information    [facility] on 03/04/2018.   Why:   Please follow up on Monday March 04, 2018 at 8am. Please bring your insurance information, ID, dischagre papework and all medications. Thank you. Contact information: [address] [place] [address] [phone]           Next level of care provider has access to [place] and Suicide Prevention discussed: Yes,  Completed with patient and Son Lori Butler [phone]   Have you used any form of tobacco in the last 30 days? (Cigarettes, Smokeless Tobacco, Cigars, and/or Pipes): No  Has patient been referred to the Quitline?: N/A patient is not a smoker  Patient has been referred for addiction treatment: [place], LCSW 02/28/2018, 9:40 AM

## 2018-02-28 NOTE — Progress Notes (Signed)
Patient denies SI/HI, denies A/V hallucinations. Patient verbalizes understanding of discharge instructions, follow up care and prescriptions. Patient given all belongings from [facility] locker. Patient escorted out by staff, transported by family. 

## 2018-02-28 NOTE — Plan of Care (Signed)
  Problem: Coping: Goal: Coping ability will improve Outcome: Progressing  Patient appears less anxious interacting appropriately.

## 2018-02-28 NOTE — Discharge Summary (Signed)
Physician Discharge Summary Note  Patient:  Lori Butler is an 67 y.o., female MRN:  [id number] DOB:  06-30-1950 Patient phone:  [phone] (home)  Patient address:   [address] [place] [address],  Total Time spent with patient: 20 minutes  Plus 20 minutes of medication reconciliation, discharge planning, and discharge documentation   Date of Admission:  02/26/2018 Date of Discharge: 02/28/18  Reason for Admission:  67 yo female admitted after suicide attempt by overdosing on Wellbutrin. She was admitted to the medical floor at [facility]. She was dehydrated, tachycardic with leukocytosis. Work up for infection was negative. CK was quite elevated but has trended down. She also had urinary retention which has now resolved. Psychiatry was consulted and recommended transfer to inpatient psychiatric hospitalization.            Upon evaluation today, pt was very calm and cooperative. She is forthcoming with information. She states that she quit smoking in May and "since then my health has declined a lot." She states that she was diagnosed with COPD after that and "went into respiratory failure twice." She sees a pulmonologist regularly, Dr. Emogene Morgan. She states that she has been doing her daily exercises regularly to try to get her lung function up. She walks the aisles of the grocery store daily to help with this. She states that she has always been very independent and able to take care of herself. She states that she has felt like a burden to her son because he checks on her daily. She states that she felt guilty for him having to do this because he has a family. She states that she was initially on Wellbutrin to help her stop smoking but asked to be back on it "to help me cope with all of this." She denies feelings of persistent depression but admits she was under a lot of stress with these new health issues. She does have a lot of anxiety. She adamantly denies any past suicidal  thoughts. She states that the weekend this happened she was feeling pretty good. She states that she was going about her normal routine, doing her daily exercises. She then went to bed. She states that in the morning she impulsively took an overdose of Wellbutrin. She does not recall how many she took but "people told me it was around 40." She adamantly denies that this was something she was thinking of doing prior to doing it. She denies researching ways to kill herself. When asked why she chose Wellbutrin, she states, "Because that's what I had."  She denies ever having suicidal thoughts in the past. She states, "that was not me at all. I wonder if I had an infection or something because I normally would never do something like that." She states that she is extremely happy to be alive. She is tearful stating that she feels very guilty for hurting her family especially her son and daughter in law.  She states "I defiantly regret doing it." She denies SI or any thoughts of self harm. She states, "I feel so ashamed for doing that." She states that she looks forward to continuing to be in her son and daughter in laws life. She is very close to her 3 granddaughter and wants to be in their life. She gets to see them at least once a week. She also wants to try to get another part time job to stay busy. She was working at [employer] and really enjoyed it. She  has some friends through work that she talks to. She states that overall she sleeps generally well. She has always woken up really early I in the morning for work so still does this to get her routine started. She eats well. Denies any history of manic symptoms or psychosis. Pt states that she is open to seeing a therapist and a psychiatrist.              I spoke with her son, Tommi Rumps with her permission. He states that this was a shock for him. He states that her medical issues were likely taking a toll on her. He states that she does struggle with some anxiety. We  discussed that she is open to seeing a therapist and he is glad to hear this. He states that he plans to come to see her and talk with her today to make sure she is going to follow through with recommendations. He is very supportive of her.   Principal Problem: Major depressive disorder, recurrent severe without psychotic features [facility]) Discharge Diagnoses: Patient Active Problem List   Diagnosis Date Noted  . Major depressive disorder, recurrent severe without psychotic features ([place]) [F33.2] 02/24/2018    Priority: High  . Attempted suicide ([place]) [T14.91XA] 02/21/2018  . Hypoxia [R09.02]   . Physical deconditioning [R53.81]   . COPD exacerbation ([place]) [J44.1]   . Vitamin B12 deficiency [E53.8] 11/10/2017  . Osteoporosis [M81.0] 11/10/2017  . Vitamin D deficiency [E55.9] 11/10/2017  . Tobacco abuse [Z72.0] 09/20/2017  . COPD (chronic obstructive pulmonary disease) ([place]) [J44.9] 09/15/2017    Past Psychiatric History: See H&P  Past Medical History:  Past Medical History:  Diagnosis Date  . Acute respiratory failure with hypoxia ([place])   . Anxiety   . Aortic atherosclerosis ([place])   . Arthritis    "a little in my hands probably" (10/31/2017)  . COPD (chronic obstructive pulmonary disease) ([place])   . COPD exacerbation ([place])   . Depression   . History of kidney stones   . Suicide attempt ([place]) 02/21/2018   overdosed on Wellbutrin    Past Surgical History:  Procedure Laterality Date  . ABDOMINAL HYSTERECTOMY    . APPENDECTOMY    . BREAST BIOPSY Left 1990s  . CATARACT EXTRACTION W/ INTRAOCULAR LENS  IMPLANT, BILATERAL Bilateral   . DILATION AND CURETTAGE OF UTERUS    . TONSILLECTOMY    . TUBAL LIGATION     Family History:  Family History  Problem Relation Age of Onset  . Breast cancer Mother    Family Psychiatric  History: See H&P Social History:  Social History   Substance and Sexual Activity  Alcohol Use Not Currently   Comment: 02/21/2018 "couple drinks/year"      Social History   Substance and Sexual Activity  Drug Use Never    Social History   Socioeconomic History  . Marital status: Divorced    Spouse name: Not on file  . Number of children: Not on file  . Years of education: Not on file  . Highest education level: Not on file  Occupational History  . Not on file  Social Needs  . Financial resource strain: Somewhat hard  . Food insecurity:    Worry: Not on file    Inability: Not on file  . Transportation needs:    Medical: Not on file    Non-medical: Not on file  Tobacco Use  . Smoking status: Former Smoker    Packs/day: 1.50    Years: 51.00  Pack years: 76.50    Types: Cigarettes    Last attempt to quit: 09/05/2017    Years since quitting: 0.4  . Smokeless tobacco: Never Used  Substance and Sexual Activity  . Alcohol use: Not Currently    Comment: 02/21/2018 "couple drinks/year"  . Drug use: Never  . Sexual activity: Not Currently  Lifestyle  . Physical activity:    Days per week: Not on file    Minutes per session: Not on file  . Stress: Not on file  Relationships  . Social connections:    Talks on phone: Not on file    Gets together: Not on file    Attends religious service: Not on file    Active member of club or organization: Not on file    Attends meetings of clubs or organizations: Not on file    Relationship status: Not on file  Other Topics Concern  . Not on file  Social History Narrative  . Not on file    Hospital Course:  Pt was started on Zoloft while on the medical floor and Wellbutrin was stopped. She does have anxiety so Zoloft was felt to be a better treatment option. She attended groups while on the unit. ON day of discharge, she reported feeling like her mood was better. She still feels very guilty for the suicide attempt and "Hopes I can be forgiven." She states that she had a really good visit from her son and her sister last night. They talked about the future and moving forward. She wants to  get in a better routine and possible get a part time job to stay busy. She adamantly denies SI or any thoughts of self harm. She slept okay last night. She is agreeable to try therapy. We also discussed [facility] referral for more support at home. She is future oriented and wants to be in her granddaughters lives.  I spoke with her son again today. He states that they had a really good visit last night. She was open with them and he felt she was doing better. He states that she is having some anxiety related to being on the unit. We discussed her medciation changes including switching from Wellbutrin to Zoloft. Also discussed the [facility] referral that was made. He states that he feels comfortable with her discharging today. He will have her stay at his house and her sister will be there to look after her and hang out with her. Her family will pick her up today.   The patient is at low risk of imminent suicide. Patient denied thoughts, intent, or plan for harm to self or others, expressed significant future orientation, and expressed an ability to mobilize assistance for her needs. She is presently void of any contributing psychiatric symptoms, cognitive difficulties, or substance use which would elevate /her risk for lethality. Chronic risk for lethality is elevated in light of new medical diagnosis of COPD. The chronic risk is presently mitigated by her ongoing desire and engagement in [facility] treatment and mobilization of support from family and friends. Chronic risk may elevate if she experiences any significant loss or worsening of symptoms, which can be managed and monitored through outpatient providers. At this time,a cute risk for lethality is low and she is stable for ongoing outpatient management.   Modifiable risk factors were addressed during this hospitalization through appropriate pharmacotherapy and establishment of outpatient follow-up treatment. Some risk factors for suicide are situational (i.e. Unstable  housing) or related personality pathology (i.e. Poor  coping mechanisms) and thus cannot be further mitigated by continued hospitalization in this setting.    Physical Findings: AIMS:  , ,  ,  ,    CIWA:    COWS:     Musculoskeletal: Strength & Muscle Tone: within normal limits Gait & Station: normal Patient leans: N/A  Psychiatric Specialty Exam: Physical Exam  Nursing note and vitals reviewed.   Review of Systems  All other systems reviewed and are negative.   Blood pressure 133/68, pulse (!) 114, temperature 97.7 F (36.5 C), temperature source Oral, resp. rate 18, height 5\' 5"  (1.651 m), weight 68 kg, SpO2 97 %.Body mass index is 24.96 kg/m.  General Appearance: Casual  Eye Contact:  Good  Speech:  Clear and Coherent  Volume:  Normal  Mood:  Anxious  Affect:  Congruent  Thought Process:  Coherent and Goal Directed  Orientation:  Full (Time, Place, and Person)  Thought Content:  Logical  Suicidal Thoughts:  No  Homicidal Thoughts:  No  Memory:  Immediate;   Fair  Judgement:  Fair  Insight:  Fair  Psychomotor Activity:  Normal  Concentration:  Concentration: Fair  Recall:  [place] of Knowledge:  Fair  Language:  Fair  Akathisia:  No      Assets:  Resilience  ADL's:  Intact  Cognition:  WNL  Sleep:  Number of Hours: 6.5     Have you used any form of tobacco in the last 30 days? (Cigarettes, Smokeless Tobacco, Cigars, and/or Pipes): No  Has this patient used any form of tobacco in the last 30 days? (Cigarettes, Smokeless Tobacco, Cigars, and/or Pipes) No  Blood Alcohol level:  Lab Results  Component Value Date   ETH <10 69/62/9528    Metabolic Disorder Labs:  Lab Results  Component Value Date   HGBA1C 5.0 02/27/2018   MPG 96.8 02/27/2018   MPG 96.8 12/10/2017   No results found for: PROLACTIN Lab Results  Component Value Date   CHOL 184 02/27/2018   TRIG 112 02/27/2018   HDL 47 02/27/2018   CHOLHDL 3.9 02/27/2018   VLDL 22 02/27/2018    LDLCALC 115 (H) 02/27/2018    See Psychiatric Specialty Exam and Suicide Risk Assessment completed by Attending Physician prior to discharge.  Discharge destination:  Home  Is patient on multiple antipsychotic therapies at discharge:  No   Has Patient had three or more failed trials of antipsychotic monotherapy by history:  No  Recommended Plan for Multiple Antipsychotic Therapies: NA  Discharge Instructions    Increase activity slowly   Complete by:  As directed      Allergies as of 02/28/2018      Reactions   Demerol [meperidine] Other (See Comments)   "horrible reaction"   Oxycodone-acetaminophen Other (See Comments)   Upset stomach    Tramadol Nausea And Vomiting      Medication List    TAKE these medications     Indication  acetaminophen 500 MG tablet Commonly known as:  TYLENOL Take 1,000 mg by mouth every 6 (six) hours as needed for mild pain or headache.  Indication:  Pain   albuterol (2.5 MG/3ML) 0.083% nebulizer solution Commonly known as:  PROVENTIL Take 3 mLs (2.5 mg total) by nebulization every 4 (four) hours as needed for wheezing or shortness of breath.  Indication:  Disease Involving Spasms of the Bronchus   albuterol 108 (90 Base) MCG/ACT inhaler Commonly known as:  PROVENTIL HFA;VENTOLIN HFA Inhale 2 puffs into the lungs  every 6 (six) hours as needed for wheezing or shortness of breath.  Indication:  Disease Involving Spasms of the Bronchus   alendronate 70 MG tablet Commonly known as:  FOSAMAX Take 70 mg by mouth every Wednesday.  Indication:  Osteoporosis   budesonide-formoterol 160-4.5 MCG/ACT inhaler Commonly known as:  SYMBICORT Inhale 2 puffs into the lungs 2 (two) times daily.  Indication:  Asthma, Chronic Obstructive Lung Disease   guaiFENesin 600 MG 12 hr tablet Commonly known as:  MUCINEX Take 600 mg by mouth 2 (two) times daily.  Indication:  Cough   montelukast 10 MG tablet Commonly known as:  SINGULAIR Take 1 tablet (10 mg  total) by mouth at bedtime.  Indication:  Asthma   pantoprazole 40 MG tablet Commonly known as:  PROTONIX Take 1 tablet (40 mg total) by mouth 2 (two) times daily.  Indication:  Gastroesophageal Reflux Disease   sertraline 50 MG tablet Commonly known as:  ZOLOFT Take 1 tablet (50 mg total) by mouth daily. Start taking on:  03/01/2018  Indication:  Generalized Anxiety Disorder, Major Depressive Disorder   tiotropium 18 MCG inhalation capsule Commonly known as:  SPIRIVA Place 1 capsule (18 mcg total) into inhaler and inhale daily.  Indication:  Chronic Obstructive Lung Disease      Follow-up Information    [facility] on 03[facility]2018.   Why:   Please follow up on Monday March 04, 2018 at 8am. Please bring your insurance information, ID, dischagre papework and all medications. Thank you. Contact information: [place] [address] [phone]           Signed: [name], MD 02/28/2018, 9:52 AM

## 2018-02-28 NOTE — Progress Notes (Signed)
Recreation Therapy Notes  INPATIENT RECREATION TR PLAN  Patient Details Name: Lori Butler MRN: [id number] DOB: [birth year] Today's Date: 02/28/2018  Rec Therapy Plan Is patient appropriate for Therapeutic Recreation?: Yes Treatment times per week: at least 3 Estimated Length of Stay: 5-7 days TR Treatment/Interventions: Group participation (Comment)  Discharge Criteria Pt will be discharged from therapy if:: Discharged Treatment plan/goals/alternatives discussed and agreed upon by:: Patient/family  Discharge Summary Short term goals set: Patient will identify 3 positive coping skills strategies to use for SI post d/c within 5 recreation therapy group sessions Short term goals met: Adequate for discharge Progress toward goals comments: Groups attended Which groups?: Communication, Other (Comment)(Problem Solving) Reason goals not met: N/A Therapeutic equipment acquired: N/A Reason patient discharged from therapy: Discharge from hospital Pt/family agrees with progress & goals achieved: Yes Date patient discharged from therapy: 02/28/18   Bartholomew Ramesh 02/28/2018, 11:44 AM

## 2018-02-28 NOTE — BHH Suicide Risk Assessment (Signed)
[facility] Discharge Suicide Risk Assessment   Principal Problem: Major depressive disorder, recurrent severe without psychotic features [facility]) Discharge Diagnoses:  Patient Active Problem List   Diagnosis Date Noted  . Major depressive disorder, recurrent severe without psychotic features ([place]) [F33.2] 02/24/2018    Priority: High  . Attempted suicide ([place]) [T14.91XA] 02/21/2018  . Hypoxia [R09.02]   . Physical deconditioning [R53.81]   . COPD exacerbation ([place]) [J44.1]   . Vitamin B12 deficiency [E53.8] 11/10/2017  . Osteoporosis [M81.0] 11/10/2017  . Vitamin D deficiency [E55.9] 11/10/2017  . Tobacco abuse [Z72.0] 09/20/2017  . COPD (chronic obstructive pulmonary disease) ([place]) [J44.9] 09/15/2017   Mental Status Per Nursing Assessment::   On Admission:  NA  Demographic Factors:  Age 72 or older, Divorced or widowed, Caucasian and Living alone  Loss Factors: Decrease in vocational status and Decline in physical health  Historical Factors: Prior suicide attempts and Impulsivity  Risk Reduction Factors:   Sense of responsibility to family, Positive social support, Positive therapeutic relationship and Positive coping skills or problem solving skills  Continued Clinical Symptoms:  Medical Diagnoses and Treatments/Surgeries  Cognitive Features That Contribute To Risk:  None    Suicide Risk:  Minimal Acute RIsk: No identifiable suicidal ideation.  Follow-up Information    [facility]. Go on 03/04/2018.   Why:   Please follow up on Monday March 04, 2018 at 8am. Please bring your insurance information, ID, dischagre papework and all medications. Thank you. Contact information: [place] [address] [phone]            [name], MD 02/28/2018, 9:50 AM

## 2018-02-28 NOTE — Progress Notes (Signed)
Recreation Therapy Notes  Date: 02/28/2018  Time: 9:30 am  Location: Craft Room  Behavioral response: Appropriate  Intervention Topic: Problem Solving  Discussion/Intervention:  Group content on today was focused on problem solving. The group described what problem solving is. Patients expressed how problems affect them and how they deal with problems. Individuals identified healthy ways to deal with problems. Patients explained what normally happens to them when they do not deal with problems. The group expressed reoccurring problems for them. The group participated in the intervention "Put the story together" with their peers and worked together to put a story that was broken up in the correct order. Clinical Observations/Feedback:  Patient came to group and was pulled out of group by the Doctor. She later returned and participated in the intervention and was social with peers and staff. Jerol Rufener LRT/CTRS         Johnatha Zeidman 02/28/2018 11:23 AM

## 2018-03-01 ENCOUNTER — Other Ambulatory Visit: Payer: Self-pay | Admitting: *Deleted

## 2018-03-01 ENCOUNTER — Encounter: Payer: Self-pay | Admitting: *Deleted

## 2018-03-01 NOTE — Patient Outreach (Signed)
[facility] [address] [facility]) Care [facility]  03/01/2018  Leliana Butler 09/10/1950 [id number]   CSW was able to make initial contact with patient today to perform phone assessment, as well as assess and assist with social work needs and services.  CSW introduced self, explained role and types of services provided through [place] [facility] ([place] [facility]).  CSW further explained to patient that CSW works with patient's Lori Butler, also with Lori Butler, Lori Butler. CSW then explained the reason for the call, indicating that [name] thought that patient would benefit from social work services and resources to assist with counseling and supportive services for an intentional overdose, as well as possible inpatient psychiatric hospitalization.  CSW obtained two HIPAA compliant identifiers from patient, which included patient's name and date of birth.  Patient admitted to the intentional overdose, reporting that she had become increasingly depressed and overwhelmed with all her medical concerns; therefore, she tried to end her life by taking a handful of her prescription medication, Wellbutrin.  Patient indicated that she quit smoking in May of 2019 and was diagnosed with COPD (Chronic Obstructive Pulmonary Disease), shortly thereafter.  Since then, patient reported that she has gone into respiratory failure twice, causing her a lot of fear and anxiety.  Patient stated that she routinely follows up with her Pulmonologist, Dr. [name] with [facility].  Patient went to [facility] for medical clearance, before being transferred to [facility], where patient spent one day, before being released.    Patient endorses that she is no longer feeling suicidal, nor does she wish to harm herself in any way.  CSW offered to provide counseling and supportive services to patient, or refer  patient to a therapist of her choice; however, patient declined.  Patient stated, "I'm really not good about opening up to people, I don't want to burden them with my problems".  CSW explained that counseling services through [place] with Gann Butler would be free of charge to her, and that CSW is available to provide counseling services, at any time.  Patient agreed to think about working with CSW, taking down CSW's contact information and agreeing to contact CSW directly if she changes her mind.  CSW spent a great deal of time talking with patient about all her medical concerns, explaining that medical problems can often coincide with emotional difficulties.  Patient reported that she has a very supportive family and that she is extremely close with her son, Lori Butler.  Patient did not wish for CSW to send her EMMI information, pertaining specifically to Signs and Symptoms of Depression, nor was patient interested in receiving a list of community therapists, licensed clinical social workers, Educational psychologist, psychologists, psychiatrists, etc.  Patient endorses that she will take her antidepressant medication, Zoloft 50 MG PO Daily, exactly as prescribed.  Patient is hopeful that she will be able to start back on her Wellbutrin, admitting that it was helping to crave her desire to smoke.  Patient was encouraged to follow-up with her Primary Care Physician, Dr. Kathyrn Butler about having the Wellbutrin reinstated.  CSW reviewed suicide prevention education with patient, encouraging her to consider all of the following:  Suicide risk factors  Suicide prevention and interventions  National Suicide Hotline telephone number  [facility] assessment telephone number  [facility] Emergency Assistance [place] and/or Residential Mobile Crisis Unit telephone number  CSW  inquired as to how CSW could be of further assistance to patient at  this time, and that is when patient indicated that she is interested in receiving assistance with completing an Adult Medicaid application through the [facility].  CSW agreed to place a referral to Lori Butler, Social Work Social worker, also with [employer], requesting that she contact patient to assist with the application process.  Patient is aware that she will be receiving a call from Mrs. Humble within the next 10 business days.  Patient reported that she has transportation to and from all her physician appointments and that she is able to afford her prescription medications.  Patient currently lives alone and is able to perform all activities of daily living independently.  CSW will perform a case closure on patient, as all goals of treatment have been met from social work standpoint and no additional social work needs have been identified at this time.  CSW will notify patient's RNCM, [name], patient's Amasa, Lori Butler and patient's BSW, Lori Butler, all with [facility], of CSW's plans to close patient's case.  CSW will fax an update to patient's Primary Care Physician, Dr. Kathyrn Butler to ensure that they are aware of CSW's involvement with patient's plan of care.  Patient was very appreciative of the call and services offered.  Patient was encouraged to work with Ms. Zigmund Daniel and [name] to better understand her disease progression.  Nat Christen, BSW, MSW, LCSW  Licensed Education officer, [facility]  Mailing [place] [address], [place], [place] [address] Physical Address-[address], [place], [place] [address] Toll Free Main # [phone] Fax # [phone] Cell # [phone]  Office # [phone] [email or web address]

## 2018-03-01 NOTE — Patient Outreach (Signed)
[facility]) Care Management  03/01/2018  Lori Butler Sep 19, 1950 [id number]    Referral received on 10/23 and pt was discharged via 10/24 with MD to do transition of care.  RN attempted the initial outreach call today that was unsuccessful. RN able to leave a HIPAA approved voice message requesting a call back. Will engage further on possible needs at that time. Will also send outreach letter and scheduled a call for next week to reach out once again to this pt.  [name], RN Care Management Coordinator [place] Office [phone]

## 2018-03-04 ENCOUNTER — Ambulatory Visit: Payer: Self-pay | Admitting: *Deleted

## 2018-03-05 ENCOUNTER — Other Ambulatory Visit: Payer: Self-pay

## 2018-03-05 NOTE — Patient Outreach (Signed)
[facility]) Care Management  03/05/2018  Lori Butler July 30, 1950 [id number]  Successful outreach to the patient on today's date, HIPAA identifiers confirmed. BSW introduced self to the patient and the reason for today's call, indicating this BSW received a referral to assist the patient with a Medicaid application. The patient states she has applied for Medicaid in the past and currently receives some benefits.   The patients Medicare premium is paid by Medicaid. The patient also reports she receives extra help with prescription costs. The patient has recently incurred medical bills and is hoping she will qualify for more assistance. The patient reports a monthly income of $1284. BSW encouraged the patient to contact her Medicaid caseworker to discuss recent financial strains and inquire if she qualifies for a Medicaid deductible. The patient stated understanding and confirms she will contact her caseworker.  BSW spoke with the patient about completing a [place] application. The patient is agreeable to this BSW mailing a blank application for her review. BSW also encouraged the patient to outreach the financial department to set up a payment plan in order to avoid collection process. The patient stated understanding.  Plan: BSW to outreach the patient in the next three weeks to confirm receipt of mailed resources. The patient has been active with this BSW in the past and understands she may call this BSW directly if assistance is needed prior to the next scheduled call.  Daneen Schick, BSW, CDP [facility] [phone]

## 2018-03-06 ENCOUNTER — Other Ambulatory Visit: Payer: Self-pay | Admitting: *Deleted

## 2018-03-07 NOTE — Patient Outreach (Addendum)
[facility]) Care Management  03/06/2018  Lori Butler June 16, 1950 [id number]    Telephone Assessment  RN spoke with pt today and introduced the [facility] services and purpose for today's call. Discussed her recent discharge and any related issues or concerns. Pt states she is doing well and RN was able to inquire further on pt's management of care related to her COPD and depression. Pt states she is being counseled at her church  pastor and feels that has helped her tremendously. Pt has declined further counseling via [place] work and does not wish to be referred to a psychiatrist or psychologist. Further discussion on COPD as RN stress the importance of management this condition and educated on the COPD action zone (verified pt remains in the GREEN zone). Pt receptive to ongoing case management services with [facility] reports all upcoming appointments. Plan of care discussed related to knowledge via COPD and adherence with medication (has support) and appointments (verified sufficient transportation) follow by her recent discharge. Pt mentioned issues with a blister while in the hospital that was treated and pt has cream and ongoing padded bandages she is using. States the site has improved and she will address further if needed on her upcoming medical appointment. Denies any odors, drainage or redness just "tender". Based upon today's discussion the plan of care was discussed along with goals and interventions. Will follow up in few weeks if no sooner on pt's ongoing recovery. Provider contact name and number if pt needs further intervention on any ongoing issues that may arise concerning her management of care.  Contacted pt's primary office Horris Latino [place]) with [name] to alert of transition of care contact related to pt's recent discharged from the hospital 10/23.  THN CM Care Plan Problem Two     Most Recent Value  Care Plan Problem Two  Lack of knowledge related to COPD  Role  Documenting the Problem Two  Care Management Coordinator  Care Plan for Problem Two  Active  Interventions for Problem Two Long Term Goal   Will discussed interventions and verified pt is in the GREEN zone. Will also verified pt's understanding on what to do if acute issues occur.   THN Long Term Goal  Pt will have an increase knowledge on COPD ation zone within the next 90 days.  THN Long Term Goal Start Date  03/06/18  THN CM Short Term Goal #1   Aderence with post op medications over the next 30 days  THN CM Short Term Goal #1 Start Date  03/06/18  Interventions for Short Term Goal #2   Will review and discuss all medications and verify the importance of adherence to taking all scheduled medications to prevent acute events.  THN CM Short Term Goal #2   Adherence with medical appointments over the next 30 days  THN CM Short Term Goal #2 Start Date  03/06/18  Interventions for Short Term Goal #2  Will stress the impotance of attending all medical appointments to avoid acute events. Will verify upcoming appointments and verify pt has sufficent transportation.      [name], RN Care Management Coordinator [place] Office [phone]

## 2018-03-19 DIAGNOSIS — J449 Chronic obstructive pulmonary disease, unspecified: Secondary | ICD-10-CM | POA: Diagnosis not present

## 2018-03-21 ENCOUNTER — Other Ambulatory Visit: Payer: Self-pay

## 2018-03-21 ENCOUNTER — Ambulatory Visit: Payer: Self-pay

## 2018-03-21 NOTE — Patient Outreach (Signed)
[facility]) Care Management  03/21/2018  Shalandra Leu 21-Aug-1950 [id number]  Unsuccessful outreach to the patient in efforts to confirm receipt of mailed resources. BSW left a HIPAA compliant voice message requesting a return call.  Plan: BSW to outreach the patient within the next four business days.  Daneen Schick, BSW, CDP [facility] [phone]

## 2018-03-25 DIAGNOSIS — F322 Major depressive disorder, single episode, severe without psychotic features: Secondary | ICD-10-CM | POA: Diagnosis not present

## 2018-03-25 DIAGNOSIS — Z6825 Body mass index (BMI) 25.0-25.9, adult: Secondary | ICD-10-CM | POA: Diagnosis not present

## 2018-03-26 ENCOUNTER — Ambulatory Visit: Payer: Self-pay

## 2018-03-26 ENCOUNTER — Other Ambulatory Visit: Payer: Self-pay

## 2018-03-26 NOTE — Patient Outreach (Signed)
[place] [facility]) Care Management  03/26/2018  Aniza Shor [birth year] [id number]  Second unsuccessful outreach to the patient on today's date in efforts to confirm receipt of mailed resources. BSW left a HIPAA compliant voice message requesting a return call.  Plan: BSW to mail the patient an unsuccessful outreach letter. BSW to attempt a third and final outreach within the next four business days.  Daneen Schick, BSW, CDP [facility] [phone]

## 2018-04-01 ENCOUNTER — Other Ambulatory Visit: Payer: Self-pay

## 2018-04-01 ENCOUNTER — Ambulatory Visit: Payer: Self-pay

## 2018-04-01 NOTE — Patient Outreach (Signed)
[facility]) Care Management  04/01/2018  Trivia Heffelfinger 09-08-1950 [id number]  Third unsuccessful outreach to the patient to confirm receipt of mailed resources. BSW left a HIPAA compliant voice message requesting a return call.  Plan: BSW to perform a discipline closure on 11/27 if no return call is received from the patient.   Daneen Schick, BSW, CDP [facility] [phone]

## 2018-04-03 ENCOUNTER — Other Ambulatory Visit: Payer: Self-pay | Admitting: *Deleted

## 2018-04-03 ENCOUNTER — Other Ambulatory Visit: Payer: Self-pay

## 2018-04-03 DIAGNOSIS — J449 Chronic obstructive pulmonary disease, unspecified: Secondary | ICD-10-CM | POA: Diagnosis not present

## 2018-04-03 DIAGNOSIS — R0600 Dyspnea, unspecified: Secondary | ICD-10-CM | POA: Diagnosis not present

## 2018-04-03 NOTE — Patient Outreach (Signed)
[facility]) Care Management  04/03/2018  Lori Butler 1950-09-01 [id number]  BSW to perform a discipline closure on today's date due to an inability to maintain contact with the patient. BSW to update the patients assigned [facility] RN CM of discipline closure.  Daneen Schick, BSW, CDP [facility] [phone]

## 2018-04-03 NOTE — Patient Outreach (Signed)
[facility]) Care Management  04/03/2018  Lori Butler Oct 11, 1950 [id number]    RN attempted outreach call to pt today however unsuccessful but was able to leave a HIPAA message. Will awaiting a call back for ongoing [facility] services and rescheduled another outreach call next week.   [name], RN Care Management Coordinator [place] Office [phone]

## 2018-04-08 ENCOUNTER — Ambulatory Visit: Payer: Self-pay | Admitting: Nurse Practitioner

## 2018-04-08 ENCOUNTER — Other Ambulatory Visit: Payer: Self-pay | Admitting: *Deleted

## 2018-04-08 NOTE — Patient Outreach (Signed)
[facility] [facility]) Care Management  04/08/2018  Lori Butler Jan 20, 1951 [id number]    RN attempted outreach call however remains unsuccessful. RN able to leave a HIPAA approved voice message requesting a call back. Will also send outreach letter to pt and allow time for pt to respond. Will attempt another outreach call over the next week.   [name], RN Care Management Coordinator [place] Office [phone]

## 2018-04-10 ENCOUNTER — Ambulatory Visit: Payer: Self-pay | Admitting: *Deleted

## 2018-04-12 ENCOUNTER — Other Ambulatory Visit: Payer: Self-pay | Admitting: *Deleted

## 2018-04-12 NOTE — Patient Outreach (Signed)
[place] [facility]) Care Management  04/12/2018  Kalliopi Coupland 04-04-1951 [id number]    RN spoke with pt today and reiterated on the plan of care and purpose of today's call related to her COPD. RN reviewed the current plan of care, goal and interventions. Pt states she continues to drive herself to all her scheduled appointments with no delays. Reports she has all her prescribed medications and taking them accordingly with no needed refills. RN reiterated on COPD action zone and verified pt is in the GREEN zone today however around Thanksgiving pt had a flare up and visited her provider's office and administered a steroid to help with her breathing. Pt reports she is doing "much better" with no additional issues. Based upon today's assessment pt was receptive to transfer to a health coach for ongoing disease management of care.   THN CM Care Plan Problem Two     Most Recent Value  Care Plan Problem Two  Lack of knowledge related to COPD  Role Documenting the Problem Two  Care Management Coordinator  Care Plan for Problem Two  Active  Interventions for Problem Two Long Term Goal   Will continue to review COPD and risk invovled if not monitored. Will verify pt's proactive interventions with use of inhalers, nebulizers and all other related medications to prevent acute events with pt's COPD. Will extend for ongoing assessment with health coach as agreed  on today's assessment.  THN Long Term Goal  Pt will have an increase knowledge on COPD ation zone within the next 90 days.  THN Long Term Goal Start Date  03/06/18  THN CM Short Term Goal #1   Aderence with post op medications over the next 30 days  THN CM Short Term Goal #1 Start Date  03/06/18  [facility] CM Short Term Goal #1 Met Date   04/12/18  Interventions for Short Term Goal #2   Verified pt aware of usage with all inhaler and nebulizers to prevent exacerbation episodes.  THN CM Short Term Goal #2   Adherence with medical appointments  over the next 30 days  THN CM Short Term Goal #2 Start Date  03/06/18  [facility] CM Short Term Goal #2 Met Date  04/12/18  Interventions for Short Term Goal #2  Pt continue to drive go all scheudled appointments with no delays.      [name], RN Care Management Coordinator [place] Office [phone]

## 2018-04-15 ENCOUNTER — Ambulatory Visit: Payer: Medicare HMO | Admitting: Nurse Practitioner

## 2018-04-15 ENCOUNTER — Encounter: Payer: Self-pay | Admitting: Nurse Practitioner

## 2018-04-15 VITALS — BP 128/70 | HR 85 | Ht 65.0 in | Wt 154.8 lb

## 2018-04-15 DIAGNOSIS — J449 Chronic obstructive pulmonary disease, unspecified: Secondary | ICD-10-CM

## 2018-04-15 DIAGNOSIS — B370 Candidal stomatitis: Secondary | ICD-10-CM | POA: Diagnosis not present

## 2018-04-15 MED ORDER — NYSTATIN 100000 UNIT/ML MT SUSP: 5.0000 mL | Freq: Four times a day (QID) | OROMUCOSAL | 0 refills | Status: AC

## 2018-04-15 NOTE — Patient Instructions (Signed)
Will order nystatin swish and spit - use after each meal and at bedtime for thrush Continue Symbicort Continue Spiriva Stay active Follow up with Dr. Elsworth Soho in 3 months or sooner if needed

## 2018-04-15 NOTE — Progress Notes (Signed)
@Patient  ID: Lori Butler, female    DOB: [birth year], 67 y.o.   MRN: [id number]  Chief Complaint  Patient presents with  . Follow-up    Referring provider: [name], MD  HPI 67 year old female former smoker with moderate COPD followed by Dr. Elsworth Soho.  Tests: CT angio 09/2017 >>Emphysema andInspissated mucus in the LEFT LOWER lobe bronchi,mild fibrotic changes upper lobes  PFT:  PFT Results Latest Ref Rng & Units 10/26/2017  FVC-Pre L 2.09  FVC-Predicted Pre % 65  FVC-Post L 2.04  FVC-Predicted Post % 64  Pre FEV1/FVC % % 59  Post FEV1/FCV % % 63  FEV1-Pre L 1.24  FEV1-Predicted Pre % 51  FEV1-Post L 1.28  DLCO UNC% % 57  DLCO COR %Predicted % 73  TLC L 5.30  TLC % Predicted % 101  RV % Predicted % 141   OV 04/15/18 - follow up COPD Patient presents for a follow up visit for COPD. She states that she has been doing well. Has not had any exacerbations since last follow up with Dr. Elsworth Soho. She states that she is compliant with Symbicort and Spiriva. She states that it works well for her. She complains today of a hoarse voice since starting Symbicort. She denies any sore throat or post nasal drip. She states that she does deep breathing exercises daily and walks daily. She tries to stay active. She denies any shortness of breath, chest pain, or edema.        Allergies  Allergen Reactions  . Demerol [Meperidine] Other (See Comments)    "horrible reaction"  . Oxycodone-Acetaminophen Other (See Comments)    Upset stomach   . Tramadol Nausea And Vomiting    Immunization History  Administered Date(s) Administered  . Influenza, High Dose Seasonal PF 01/21/2018    Past Medical History:  Diagnosis Date  . Acute respiratory failure with hypoxia ([place])   . Anxiety   . Aortic atherosclerosis ([place])   . Arthritis    "a little in my hands probably" (10/31/2017)  . COPD (chronic obstructive pulmonary disease) ([place])   . COPD exacerbation ([place])   . Depression   . History  of kidney stones   . Suicide attempt ([place]) 02/21/2018   overdosed on Wellbutrin    Tobacco History: Social History   Tobacco Use  Smoking Status Former Smoker  . Packs/day: 1.50  . Years: 51.00  . Pack years: 76.50  . Types: Cigarettes  . Last attempt to quit: 09/05/2017  . Years since quitting: 0.6  Smokeless Tobacco Never Used   Counseling given: Not Answered   Outpatient Encounter Medications as of 04/15/2018  Medication Sig  . acetaminophen (TYLENOL) 500 MG tablet Take 1,000 mg by mouth every 6 (six) hours as needed for mild pain or headache.  . albuterol (PROVENTIL HFA;VENTOLIN HFA) 108 (90 Base) MCG/ACT inhaler Inhale 2 puffs into the lungs every 6 (six) hours as needed for wheezing or shortness of breath.  Marland Kitchen albuterol (PROVENTIL) (2.5 MG/3ML) 0.083% nebulizer solution Take 3 mLs (2.5 mg total) by nebulization every 4 (four) hours as needed for wheezing or shortness of breath.  Marland Kitchen alendronate (FOSAMAX) 70 MG tablet Take 70 mg by mouth every Wednesday.   . budesonide-formoterol (SYMBICORT) 160-4.5 MCG/ACT inhaler Inhale 2 puffs into the lungs 2 (two) times daily.  . Cholecalciferol (VITAMIN D) 50 MCG (2000 UT) tablet Take 2,000 Units by mouth daily.  Marland Kitchen guaiFENesin (MUCINEX) 600 MG 12 hr tablet Take 600 mg by mouth 2 (two)  times daily.  . montelukast (SINGULAIR) 10 MG tablet Take 1 tablet (10 mg total) by mouth at bedtime.  . pantoprazole (PROTONIX) 40 MG tablet Take 1 tablet (40 mg total) by mouth 2 (two) times daily.  . sertraline (ZOLOFT) 50 MG tablet Take 1 tablet (50 mg total) by mouth daily.  Marland Kitchen tiotropium (SPIRIVA HANDIHALER) 18 MCG inhalation capsule Place 1 capsule (18 mcg total) into inhaler and inhale daily.  Marland Kitchen nystatin (MYCOSTATIN) 100000 UNIT/ML suspension Take 5 mLs (500,000 Units total) by mouth 4 (four) times daily for 7 days.   No facility-administered encounter medications on file as of 04/15/2018.      Review of Systems  Review of Systems  Constitutional:  Negative.  Negative for chills and fever.  HENT: Positive for voice change.   Respiratory: Negative for cough and shortness of breath.   Cardiovascular: Negative.  Negative for chest pain, palpitations and leg swelling.  Gastrointestinal: Negative.   Allergic/Immunologic: Negative.   Neurological: Negative.   Psychiatric/Behavioral: Negative.        Physical Exam  BP 128/70 (BP Location: Right Arm, Patient Position: Sitting, Cuff Size: Normal)   Pulse 85   Ht 5\' 5"  (1.651 m)   Wt 154 lb 12.8 oz (70.2 kg)   SpO2 99%   BMI 25.76 kg/m   Wt Readings from Last 5 Encounters:  04/15/18 154 lb 12.8 oz (70.2 kg)  02/05/18 154 lb 9.6 oz (70.1 kg)  01/03/18 150 lb (68 kg)  12/25/17 150 lb 5.7 oz (68.2 kg)  12/09/17 150 lb (68 kg)     Physical Exam  Constitutional: She is oriented to person, place, and time. She appears well-developed and well-nourished. No distress.  HENT:  Mouth/Throat: No oropharyngeal exudate.  White patches noted to tongue  Cardiovascular: Normal rate and regular rhythm.  Pulmonary/Chest: Effort normal and breath sounds normal. No respiratory distress. She has no wheezes.  Musculoskeletal: She exhibits no edema.  Neurological: She is alert and oriented to person, place, and time.  Psychiatric: She has a normal mood and affect.  Nursing note and vitals reviewed.      Assessment & Plan:   Thrush, oral Patient Instructions  Will order nystatin swish and spit - use after each meal and at bedtime for thrush Continue Symbicort Continue Spiriva Stay active Follow up with Dr. Elsworth Soho in 3 months or sooner if needed     COPD (chronic obstructive pulmonary disease) ([place]) COPD stable Patient Instructions  Will order nystatin swish and spit - use after each meal and at bedtime for thrush Continue Symbicort Continue Spiriva Stay active Follow up with Dr. Elsworth Soho in 3 months or sooner if needed        Fenton Foy, NP 04/15/2018

## 2018-04-15 NOTE — Assessment & Plan Note (Signed)
COPD stable Patient Instructions  Will order nystatin swish and spit - use after each meal and at bedtime for thrush Continue Symbicort Continue Spiriva Stay active Follow up with Dr. Elsworth Soho in 3 months or sooner if needed

## 2018-04-15 NOTE — Assessment & Plan Note (Signed)
Patient Instructions  Will order nystatin swish and spit - use after each meal and at bedtime for thrush Continue Symbicort Continue Spiriva Stay active Follow up with Dr. Elsworth Soho in 3 months or sooner if needed

## 2018-04-18 DIAGNOSIS — J449 Chronic obstructive pulmonary disease, unspecified: Secondary | ICD-10-CM | POA: Diagnosis not present

## 2018-04-20 DIAGNOSIS — R0602 Shortness of breath: Secondary | ICD-10-CM | POA: Diagnosis not present

## 2018-04-20 DIAGNOSIS — J449 Chronic obstructive pulmonary disease, unspecified: Secondary | ICD-10-CM | POA: Diagnosis not present

## 2018-05-05 ENCOUNTER — Inpatient Hospital Stay (HOSPITAL_COMMUNITY)
Admission: EM | Admit: 2018-05-05 | Discharge: 2018-05-08 | DRG: 190 | Disposition: A | Discharge status: Home / Self Care | Payer: Medicare HMO | Attending: Internal Medicine | Admitting: Internal Medicine

## 2018-05-05 ENCOUNTER — Emergency Department (HOSPITAL_COMMUNITY): Payer: Medicare HMO

## 2018-05-05 ENCOUNTER — Encounter (HOSPITAL_COMMUNITY): Payer: Self-pay

## 2018-05-05 DIAGNOSIS — I471 Supraventricular tachycardia: Secondary | ICD-10-CM | POA: Diagnosis present

## 2018-05-05 DIAGNOSIS — F329 Major depressive disorder, single episode, unspecified: Secondary | ICD-10-CM | POA: Diagnosis present

## 2018-05-05 DIAGNOSIS — M810 Age-related osteoporosis without current pathological fracture: Secondary | ICD-10-CM | POA: Diagnosis not present

## 2018-05-05 DIAGNOSIS — J441 Chronic obstructive pulmonary disease with (acute) exacerbation: Secondary | ICD-10-CM | POA: Diagnosis not present

## 2018-05-05 DIAGNOSIS — Z915 Personal history of self-harm: Secondary | ICD-10-CM

## 2018-05-05 DIAGNOSIS — Z79899 Other long term (current) drug therapy: Secondary | ICD-10-CM | POA: Diagnosis not present

## 2018-05-05 DIAGNOSIS — Z87891 Personal history of nicotine dependence: Secondary | ICD-10-CM

## 2018-05-05 DIAGNOSIS — R0600 Dyspnea, unspecified: Secondary | ICD-10-CM | POA: Diagnosis not present

## 2018-05-05 DIAGNOSIS — R062 Wheezing: Secondary | ICD-10-CM | POA: Diagnosis not present

## 2018-05-05 DIAGNOSIS — F32A Depression, unspecified: Secondary | ICD-10-CM

## 2018-05-05 DIAGNOSIS — F419 Anxiety disorder, unspecified: Secondary | ICD-10-CM | POA: Diagnosis not present

## 2018-05-05 DIAGNOSIS — R000 Tachycardia, unspecified: Secondary | ICD-10-CM | POA: Diagnosis not present

## 2018-05-05 DIAGNOSIS — R0602 Shortness of breath: Secondary | ICD-10-CM | POA: Diagnosis not present

## 2018-05-05 DIAGNOSIS — Z803 Family history of malignant neoplasm of breast: Secondary | ICD-10-CM

## 2018-05-05 DIAGNOSIS — E559 Vitamin D deficiency, unspecified: Secondary | ICD-10-CM | POA: Diagnosis present

## 2018-05-05 DIAGNOSIS — J80 Acute respiratory distress syndrome: Secondary | ICD-10-CM | POA: Diagnosis not present

## 2018-05-05 DIAGNOSIS — R0689 Other abnormalities of breathing: Secondary | ICD-10-CM | POA: Diagnosis not present

## 2018-05-05 DIAGNOSIS — R0902 Hypoxemia: Secondary | ICD-10-CM

## 2018-05-05 DIAGNOSIS — J9601 Acute respiratory failure with hypoxia: Secondary | ICD-10-CM | POA: Diagnosis present

## 2018-05-05 DIAGNOSIS — Z7951 Long term (current) use of inhaled steroids: Secondary | ICD-10-CM

## 2018-05-05 HISTORY — DX: Anxiety disorder, unspecified: F41.9

## 2018-05-05 LAB — CBC
HEMATOCRIT: 40.9 % (ref 36.0–46.0)
HEMOGLOBIN: 13 g/dL (ref 12.0–15.0)
MCH: 30 pg (ref 26.0–34.0)
MCHC: 31.8 g/dL (ref 30.0–36.0)
MCV: 94.2 fL (ref 80.0–100.0)
Platelets: 383 10*3/uL (ref 150–400)
RBC: 4.34 MIL/uL (ref 3.87–5.11)
RDW: 13.5 % (ref 11.5–15.5)
WBC: 13 10*3/uL — AB (ref 4.0–10.5)
nRBC: 0 % (ref 0.0–0.2)

## 2018-05-05 LAB — BASIC METABOLIC PANEL
ANION GAP: 9 (ref 5–15)
BUN: 21 mg/dL (ref 8–23)
CHLORIDE: 110 mmol/L (ref 98–111)
CO2: 21 mmol/L — ABNORMAL LOW (ref 22–32)
Calcium: 8.5 mg/dL — ABNORMAL LOW (ref 8.9–10.3)
Creatinine, Ser: 0.68 mg/dL (ref 0.44–1.00)
GFR calc Af Amer: >60 mL/min (ref >60)
GFR calc non Af Amer: >60 mL/min (ref >60)
GLUCOSE: 128 mg/dL — AB (ref 70–99)
POTASSIUM: 3.8 mmol/L (ref 3.5–5.1)
SODIUM: 140 mmol/L (ref 135–145)

## 2018-05-05 MED ORDER — ALBUTEROL SULFATE (2.5 MG/3ML) 0.083% IN NEBU
5.0000 mg | INHALATION_SOLUTION | Freq: Once | RESPIRATORY_TRACT | Status: AC
  Administered 2018-05-05: 5 mg via RESPIRATORY_TRACT
  Filled 2018-05-05: qty 6

## 2018-05-05 MED ORDER — ACETAMINOPHEN 650 MG RE SUPP: 650.0000 mg | Freq: Four times a day (QID) | RECTAL | Status: DC | PRN

## 2018-05-05 MED ORDER — ONDANSETRON HCL 4 MG/2ML IJ SOLN: 4.0000 mg | Freq: Four times a day (QID) | INTRAMUSCULAR | Status: DC | PRN

## 2018-05-05 MED ORDER — ACETAMINOPHEN 325 MG PO TABS: 650.0000 mg | ORAL_TABLET | Freq: Four times a day (QID) | ORAL | Status: DC | PRN

## 2018-05-05 MED ORDER — SERTRALINE HCL 50 MG PO TABS
50.0000 mg | ORAL_TABLET | Freq: Every day | ORAL | Status: DC
  Administered 2018-05-06 – 2018-05-08 (×3): 50 mg via ORAL
  Filled 2018-05-05 (×3): qty 1

## 2018-05-05 MED ORDER — TIOTROPIUM BROMIDE MONOHYDRATE 18 MCG IN CAPS: 18.0000 ug | ORAL_CAPSULE | Freq: Every day | RESPIRATORY_TRACT | Status: DC

## 2018-05-05 MED ORDER — PANTOPRAZOLE SODIUM 40 MG PO TBEC
40.0000 mg | DELAYED_RELEASE_TABLET | Freq: Two times a day (BID) | ORAL | Status: DC
  Administered 2018-05-05 – 2018-05-08 (×6): 40 mg via ORAL
  Filled 2018-05-05 (×6): qty 1

## 2018-05-05 MED ORDER — AZITHROMYCIN 250 MG PO TABS
500.0000 mg | ORAL_TABLET | Freq: Every day | ORAL | Status: AC
  Administered 2018-05-05: 500 mg via ORAL
  Filled 2018-05-05: qty 2

## 2018-05-05 MED ORDER — AZITHROMYCIN 250 MG PO TABS
250.0000 mg | ORAL_TABLET | Freq: Every day | ORAL | Status: DC
  Administered 2018-05-06 – 2018-05-08 (×3): 250 mg via ORAL
  Filled 2018-05-05 (×3): qty 1

## 2018-05-05 MED ORDER — METHYLPREDNISOLONE SODIUM SUCC 40 MG IJ SOLR
40.0000 mg | Freq: Two times a day (BID) | INTRAMUSCULAR | Status: DC
  Administered 2018-05-05 – 2018-05-07 (×5): 40 mg via INTRAVENOUS
  Filled 2018-05-05 (×5): qty 1

## 2018-05-05 MED ORDER — IPRATROPIUM BROMIDE 0.02 % IN SOLN
0.5000 mg | Freq: Once | RESPIRATORY_TRACT | Status: AC
  Administered 2018-05-05: 0.5 mg via RESPIRATORY_TRACT
  Filled 2018-05-05: qty 2.5

## 2018-05-05 MED ORDER — BUPROPION HCL ER (XL) 150 MG PO TB24: 150.0000 mg | ORAL_TABLET | Freq: Every day | ORAL | Status: DC

## 2018-05-05 MED ORDER — MOMETASONE FURO-FORMOTEROL FUM 200-5 MCG/ACT IN AERO
2.0000 | INHALATION_SPRAY | Freq: Two times a day (BID) | RESPIRATORY_TRACT | Status: DC
  Administered 2018-05-06 – 2018-05-08 (×5): 2 via RESPIRATORY_TRACT
  Filled 2018-05-05: qty 8.8

## 2018-05-05 MED ORDER — GUAIFENESIN ER 600 MG PO TB12
600.0000 mg | ORAL_TABLET | Freq: Two times a day (BID) | ORAL | Status: DC
  Administered 2018-05-05 – 2018-05-08 (×6): 600 mg via ORAL
  Filled 2018-05-05 (×6): qty 1

## 2018-05-05 MED ORDER — SODIUM CHLORIDE 0.9% FLUSH
3.0000 mL | Freq: Two times a day (BID) | INTRAVENOUS | Status: DC
  Administered 2018-05-05 – 2018-05-07 (×5): 3 mL via INTRAVENOUS

## 2018-05-05 MED ORDER — ALBUTEROL SULFATE (2.5 MG/3ML) 0.083% IN NEBU: 2.5000 mg | INHALATION_SOLUTION | RESPIRATORY_TRACT | Status: DC | PRN

## 2018-05-05 MED ORDER — MONTELUKAST SODIUM 10 MG PO TABS
10.0000 mg | ORAL_TABLET | Freq: Every day | ORAL | Status: DC
  Administered 2018-05-05 – 2018-05-07 (×3): 10 mg via ORAL
  Filled 2018-05-05 (×3): qty 1

## 2018-05-05 MED ORDER — ONDANSETRON HCL 4 MG PO TABS: 4.0000 mg | ORAL_TABLET | Freq: Four times a day (QID) | ORAL | Status: DC | PRN

## 2018-05-05 MED ORDER — ENOXAPARIN SODIUM 40 MG/0.4ML ~~LOC~~ SOLN
40.0000 mg | SUBCUTANEOUS | Status: DC
  Administered 2018-05-05 – 2018-05-07 (×3): 40 mg via SUBCUTANEOUS
  Filled 2018-05-05 (×3): qty 0.4

## 2018-05-05 MED ORDER — UMECLIDINIUM BROMIDE 62.5 MCG/INH IN AEPB
1.0000 | INHALATION_SPRAY | Freq: Every day | RESPIRATORY_TRACT | Status: DC
  Administered 2018-05-06 – 2018-05-08 (×3): 1 via RESPIRATORY_TRACT
  Filled 2018-05-05: qty 7

## 2018-05-05 MED ORDER — IPRATROPIUM-ALBUTEROL 0.5-2.5 (3) MG/3ML IN SOLN
3.0000 mL | Freq: Four times a day (QID) | RESPIRATORY_TRACT | Status: DC
  Administered 2018-05-05 – 2018-05-07 (×9): 3 mL via RESPIRATORY_TRACT
  Filled 2018-05-05 (×10): qty 3

## 2018-05-05 NOTE — ED Notes (Signed)
Attempted to call report to floor. Accepting nurse requesting to call ED back later to receive report.

## 2018-05-05 NOTE — H&P (Signed)
History and Physical    Lori Butler MWU:[id number] DOB: Sep 19, 1950 DOA: 05/05/2018  PCP: [name], MD  Patient coming from: Home via EMS  I have personally briefly reviewed patient's old medical records in [place]  Chief Complaint: Shortness of breath  HPI: Lori Butler is a 67 y.o. female with medical history significant for COPD, anxiety/depression who presents with 3 days of progressive shortness of breath and cough productive of white sputum.  She has had to use her albuterol nebulizers twice today without significant improvement and had worsening shortness of breath and generalized weakness.  She says at baseline she normally does not have a cough.  She has felt warm but denies any subjective fevers, chills, or diaphoresis.  She has had chest tightness and reports an episode of diarrhea this morning.  She denies any dysuria.  She is a former smoker.  On route to the ED, EMS placed her on BiPAP and administered 10 mg of albuterol, 1 mg Atrovent, 125 mg Solu-Medrol, and 2 mg magnesium.  ED Course:  Initial vitals showed BP 126/57, pulse 123, RR 22, temp 97.8 Fahrenheit, SPO2 99% on BiPAP.  Labs are notable for WBC 13.0.  Portable chest x-ray showed hyperinflation of the lungs without consolidation, infiltrate, edema, or effusion.  She was given an albuterol nebulizer and an Atrovent nebulizer and subsequently weaned off BiPAP.  She had continued dyspnea with oxygen saturation between 88 to 89% requiring continue supplemental oxygen.  The hospitalist service was consulted to admit for further management of COPD exacerbation.  Review of Systems: As per HPI otherwise 10 point review of systems negative.    Past Medical History:  Diagnosis Date  . Acute respiratory failure with hypoxia ([place])   . Anxiety   . Aortic atherosclerosis ([place])   . Arthritis    "a little in my hands probably" (10/31/2017)  . COPD (chronic obstructive pulmonary disease) ([place])   . COPD exacerbation  ([place])   . Depression   . History of kidney stones   . Suicide attempt ([place]) 02/21/2018   overdosed on Wellbutrin    Past Surgical History:  Procedure Laterality Date  . ABDOMINAL HYSTERECTOMY    . APPENDECTOMY    . BREAST BIOPSY Left 1990s  . CATARACT EXTRACTION W/ INTRAOCULAR LENS  IMPLANT, BILATERAL Bilateral   . DILATION AND CURETTAGE OF UTERUS    . TONSILLECTOMY    . TUBAL LIGATION       reports that she quit smoking about 7 months ago. Her smoking use included cigarettes. She has a 76.50 pack-year smoking history. She has never used smokeless tobacco. She reports previous alcohol use. She reports that she does not use drugs.  Allergies  Allergen Reactions  . Demerol [Meperidine] Other (See Comments)    "horrible reaction"  . Oxycodone-Acetaminophen Other (See Comments)    Upset stomach   . Tramadol Nausea And Vomiting    Family History  Problem Relation Age of Onset  . Breast cancer Mother      Prior to Admission medications   Medication Sig Start Date End Date Taking? Authorizing Provider  alendronate (FOSAMAX) 70 MG tablet Take 70 mg by mouth every Wednesday.  07/11/17  Yes [provider]  budesonide-formoterol (SYMBICORT) 160-4.5 MCG/ACT inhaler Inhale 2 puffs into the lungs 2 (two) times daily. 12/26/17  Yes Jennelle Human B, NP  buPROPion (WELLBUTRIN XL) 150 MG 24 hr tablet Take 150 mg by mouth daily.   Yes [provider]  Ipratropium-Albuterol (COMBIVENT RESPIMAT)  20-100 MCG/ACT AERS respimat Inhale 2 puffs into the lungs 4 (four) times daily as needed for wheezing.   Yes [provider]  montelukast (SINGULAIR) 10 MG tablet Take 1 tablet (10 mg total) by mouth at bedtime. 12/26/17  Yes Arnell Asal, NP  pantoprazole (PROTONIX) 40 MG tablet Take 1 tablet (40 mg total) by mouth 2 (two) times daily. 12/26/17  Yes Jennelle Human B, NP  sertraline (ZOLOFT) 50 MG tablet Take 1 tablet (50 mg total) by mouth daily. 03/01/18  Yes McNew, Tyson Babinski, MD  tiotropium (SPIRIVA HANDIHALER) 18 MCG inhalation capsule Place 1 capsule (18 mcg total) into inhaler and inhale daily. 01/03/18 01/03/19 Yes Fenton Foy, NP  acetaminophen (TYLENOL) 500 MG tablet Take 1,000 mg by mouth every 6 (six) hours as needed for mild pain or headache.    [provider]  albuterol (PROVENTIL HFA;VENTOLIN HFA) 108 (90 Base) MCG/ACT inhaler Inhale 2 puffs into the lungs every 6 (six) hours as needed for wheezing or shortness of breath. 09/16/17   Sheikh, Georgina Quint Latif, DO  albuterol (PROVENTIL) (2.5 MG/3ML) 0.083% nebulizer solution Take 3 mLs (2.5 mg total) by nebulization every 4 (four) hours as needed for wheezing or shortness of breath. 09/16/17   Raiford Noble Latif, DO  Cholecalciferol (VITAMIN D) 50 MCG (2000 UT) tablet Take 2,000 Units by mouth daily.    [provider]  guaiFENesin (MUCINEX) 600 MG 12 hr tablet Take 600 mg by mouth 2 (two) times daily.    [provider]    Physical Exam: Vitals:   05/05/18 1700 05/05/18 1730 05/05/18 1800 05/05/18 1840  BP: (!) 108/58 114/89 104/63 120/68  Pulse: (!) 107 (!) 114 (!) 109 (!) 106  Resp: 20 (!) 22 19 (!) 21  Temp:      SpO2: 99% 90% 95% 94%    Constitutional: Resting supine in bed, somewhat short of breath Eyes: PERRL, lids and conjunctivae normal ENMT: Mucous membranes are moist. Posterior pharynx clear of any exudate or lesions. Normal dentition.  Neck: normal, supple, no masses. Respiratory: Expiratory wheezing bilaterally.  Somewhat increased respiratory effort. No accessory muscle use.  Cardiovascular: Regular rate and rhythm, no murmurs / rubs / gallops. No extremity edema. Abdomen: no tenderness, no masses palpated. No hepatosplenomegaly. Bowel sounds positive.  Musculoskeletal: no clubbing / cyanosis. No joint deformity upper and lower extremities. Good ROM, no contractures. Normal muscle tone.  Skin: no rashes, lesions, ulcers. No induration Neurologic: CN 2-12  grossly intact. Sensation intact. Strength 5/5 in all 4.  Psychiatric: Normal judgment and insight. Alert and oriented x 3. Normal mood.     Labs on Admission: I have personally reviewed following labs and imaging studies  CBC: Recent Labs  Lab 05/05/18 1507  WBC 13.0*  HGB 13.0  HCT 40.9  MCV 94.2  PLT 160   Basic Metabolic Panel: Recent Labs  Lab 05/05/18 1507  NA 140  K 3.8  CL 110  CO2 21*  GLUCOSE 128*  BUN 21  CREATININE 0.68  CALCIUM 8.5*   GFR: CrCl cannot be calculated (Unknown ideal weight.). Liver Function Tests: No results for input(s): AST, ALT, ALKPHOS, BILITOT, PROT, ALBUMIN in the last 168 hours. No results for input(s): LIPASE, AMYLASE in the last 168 hours. No results for input(s): AMMONIA in the last 168 hours. Coagulation Profile: No results for input(s): INR, PROTIME in the last 168 hours. Cardiac Enzymes: No results for input(s): CKTOTAL, CKMB, CKMBINDEX, TROPONINI in the last 168 hours. BNP (  last 3 results) No results for input(s): PROBNP in the last 8760 hours. HbA1C: No results for input(s): HGBA1C in the last 72 hours. CBG: No results for input(s): GLUCAP in the last 168 hours. Lipid Profile: No results for input(s): CHOL, HDL, LDLCALC, TRIG, CHOLHDL, LDLDIRECT in the last 72 hours. Thyroid Function Tests: No results for input(s): TSH, T4TOTAL, FREET4, T3FREE, THYROIDAB in the last 72 hours. Anemia Panel: No results for input(s): VITAMINB12, FOLATE, FERRITIN, TIBC, IRON, RETICCTPCT in the last 72 hours. Urine analysis:    Component Value Date/Time   COLORURINE YELLOW 02/21/2018 1630   APPEARANCEUR CLEAR 02/21/2018 1630   LABSPEC 1.020 02/21/2018 1630   PHURINE 5.0 02/21/2018 1630   GLUCOSEU NEGATIVE 02/21/2018 1630   HGBUR NEGATIVE 02/21/2018 1630   BILIRUBINUR NEGATIVE 02/21/2018 1630   KETONESUR 20 (A) 02/21/2018 1630   PROTEINUR NEGATIVE 02/21/2018 1630   UROBILINOGEN 1.0 07/12/2012 1842   NITRITE NEGATIVE 02/21/2018 1630     LEUKOCYTESUR NEGATIVE 02/21/2018 1630    Radiological Exams on Admission: Dg Chest Portable 1 View  Result Date: 05/05/2018 CLINICAL DATA:  Worsening dyspnea EXAM: PORTABLE CHEST 1 VIEW COMPARISON:  None. FINDINGS: Emphysematous hyperinflation of the lungs without acute pulmonary consolidation, edema, effusion or pneumothorax. Heart size and mediastinal contours are stable and within normal limits. There is minimal aortic atherosclerosis at the arch. No acute osseous abnormality. IMPRESSION: Emphysematous hyperinflation of the lungs without acute pulmonary disease. Electronically Signed   By: Ashley Royalty M.D.   On: 05/05/2018 15:48    EKG: Independently reviewed.  Sinus tachycardia.  Assessment/Plan Principal Problem:   Acute exacerbation of chronic obstructive pulmonary disease (HCC) Active Problems:   Anxiety and depression   Lori Butler is a 67 y.o. female with medical history significant for COPD, anxiety/depression who presents with 3 days of progressive shortness of breath and cough productive of white sputum admitted with acute exacerbation of COPD.  Acute exacerbation of COPD: Initially requiring BiPAP.  She has new cough with sputum production.  Will admit to stepdown for now in case for need of BiPAP again. -Admit to stepdown, BiPAP PRN -Supplemental O2 as needed to maintain sats between 88 to 92% -Scheduled duo nebs, PRN albuterol nebs -IV Solu-Medrol 40 mg every 12 hours -Oral azithromycin -Continue Spiriva, Singulair, Dulera  Anxiety and depression: Had recent admission for intentional drug (Wellbutrin) overdose.  No suicidal ideation admission. -Continue sertraline  DVT prophylaxis: Lovenox Code Status: Full code Family Communication: None present on admission Disposition Plan: Likely discharge to home pending improvement in respiratory status Consults called: None Admission status: Inpatient, high risk for respiratory decompensation   Zada Finders  MD [facility] Pager [phone]  If 7PM-7AM, please contact night-coverage [email or web address] Password [facility]  05/05/2018, 7:26 PM

## 2018-05-05 NOTE — ED Provider Notes (Signed)
[place] DEPT Provider Note   CSN: [id number] Arrival date & time: 05/05/18  1458     History   Chief Complaint Chief Complaint  Patient presents with  . Shortness of Breath   Level 5 caveat: Respiratory distress-BiPAP  HPI Lori Butler is a 67 y.o. female.  HPI Patient is a 67 year old female with a history of COPD without home oxygen who presents the emergency department with increased shortness of breath and wheezing over the past 3 days.  Found to be wheezing by EMS and was in respiratory distress.  She was hypoxic.  She was placed on BiPAP and given 10 mg of albuterol, 1 mg of Atrovent, 125 mg of Solu-Medrol, 2 g of magnesium.  On arrival to emergency department she still feels short of breath and is on BiPAP but is feeling somewhat better at this time.  She denies fever but does report productive cough.    Past Medical History:  Diagnosis Date  . Acute respiratory failure with hypoxia ([place])   . Anxiety   . Aortic atherosclerosis ([place])   . Arthritis    "a little in my hands probably" (10/31/2017)  . COPD (chronic obstructive pulmonary disease) ([place])   . COPD exacerbation ([place])   . Depression   . History of kidney stones   . Suicide attempt ([place]) 02/21/2018   overdosed on Wellbutrin    Patient Active Problem List   Diagnosis Date Noted  . Thrush, oral 04/15/2018  . Major depressive disorder, recurrent severe without psychotic features ([place]) 02/24/2018  . Attempted suicide ([place]) 02/21/2018  . Hypoxia   . Physical deconditioning   . COPD exacerbation ([place])   . Vitamin B12 deficiency 11/10/2017  . Osteoporosis 11/10/2017  . Vitamin D deficiency 11/10/2017  . Tobacco abuse 09/20/2017  . COPD (chronic obstructive pulmonary disease) ([place]) 09/15/2017    Past Surgical History:  Procedure Laterality Date  . ABDOMINAL HYSTERECTOMY    . APPENDECTOMY    . BREAST BIOPSY Left 1990s  . CATARACT EXTRACTION W/ INTRAOCULAR LENS  IMPLANT,  BILATERAL Bilateral   . DILATION AND CURETTAGE OF UTERUS    . TONSILLECTOMY    . TUBAL LIGATION       OB History   No obstetric history on file.      Home Medications    Prior to Admission medications   Medication Sig Start Date End Date Taking? Authorizing Provider  alendronate (FOSAMAX) 70 MG tablet Take 70 mg by mouth every Wednesday.  07/11/17  Yes [provider]  budesonide-formoterol (SYMBICORT) 160-4.5 MCG/ACT inhaler Inhale 2 puffs into the lungs 2 (two) times daily. 12/26/17  Yes Jennelle Human B, NP  buPROPion (WELLBUTRIN XL) 150 MG 24 hr tablet Take 150 mg by mouth daily.   Yes [provider]  Ipratropium-Albuterol (COMBIVENT RESPIMAT) 20-100 MCG/ACT AERS respimat Inhale 2 puffs into the lungs 4 (four) times daily as needed for wheezing.   Yes [provider]  montelukast (SINGULAIR) 10 MG tablet Take 1 tablet (10 mg total) by mouth at bedtime. 12/26/17  Yes Arnell Asal, NP  pantoprazole (PROTONIX) 40 MG tablet Take 1 tablet (40 mg total) by mouth 2 (two) times daily. 12/26/17  Yes Jennelle Human B, NP  sertraline (ZOLOFT) 50 MG tablet Take 1 tablet (50 mg total) by mouth daily. 03/01/18  Yes McNew, Tyson Babinski, MD  tiotropium (SPIRIVA HANDIHALER) 18 MCG inhalation capsule Place 1 capsule (18 mcg total) into inhaler and inhale daily. 01/03/18 01/03/19 Yes Nichols,  Kriste Basque, NP  acetaminophen (TYLENOL) 500 MG tablet Take 1,000 mg by mouth every 6 (six) hours as needed for mild pain or headache.    [provider]  albuterol (PROVENTIL HFA;VENTOLIN HFA) 108 (90 Base) MCG/ACT inhaler Inhale 2 puffs into the lungs every 6 (six) hours as needed for wheezing or shortness of breath. 09/16/17   Sheikh, Georgina Quint Latif, DO  albuterol (PROVENTIL) (2.5 MG/3ML) 0.083% nebulizer solution Take 3 mLs (2.5 mg total) by nebulization every 4 (four) hours as needed for wheezing or shortness of breath. 09/16/17   Raiford Noble Latif, DO  Cholecalciferol (VITAMIN D) 50 MCG  (2000 UT) tablet Take 2,000 Units by mouth daily.    [provider]  guaiFENesin (MUCINEX) 600 MG 12 hr tablet Take 600 mg by mouth 2 (two) times daily.    [provider]    Family History Family History  Problem Relation Age of Onset  . Breast cancer Mother     Social History Social History   Tobacco Use  . Smoking status: Former Smoker    Packs/day: 1.50    Years: 51.00    Pack years: 76.50    Types: Cigarettes    Last attempt to quit: 09/05/2017    Years since quitting: 0.6  . Smokeless tobacco: Never Used  Substance Use Topics  . Alcohol use: Not Currently    Comment: 02/21/2018 "couple drinks/year"  . Drug use: Never     Allergies   Demerol [meperidine]; Oxycodone-acetaminophen; and Tramadol   Review of Systems Review of Systems  Unable to perform ROS: Severe respiratory distress     Physical Exam Updated Vital Signs BP 114/89 (BP Location: Right Arm)   Pulse (!) 114   Temp 97.8 F (36.6 C)   Resp (!) 22   SpO2 90%   Physical Exam Vitals signs and nursing note reviewed.  Constitutional:      General: She is not in acute distress.    Appearance: She is well-developed.  HENT:     Head: Normocephalic and atraumatic.  Neck:     Musculoskeletal: Normal range of motion.  Cardiovascular:     Rate and Rhythm: Regular rhythm. Tachycardia present.     Heart sounds: Normal heart sounds.  Pulmonary:     Effort: Respiratory distress present.     Breath sounds: No stridor. Wheezing present.  Abdominal:     General: There is no distension.     Palpations: Abdomen is soft.     Tenderness: There is no abdominal tenderness.  Musculoskeletal: Normal range of motion.     Right lower leg: No edema.     Left lower leg: No edema.  Skin:    General: Skin is warm and dry.  Neurological:     Mental Status: She is alert and oriented to person, place, and time.  Psychiatric:        Judgment: Judgment normal.      ED Treatments / Results    Labs (all labs ordered are listed, but only abnormal results are displayed) Labs Reviewed  BASIC METABOLIC PANEL - Abnormal; Notable for the following components:      Result Value   CO2 21 (*)    Glucose, Bld 128 (*)    Calcium 8.5 (*)    All other components within normal limits  CBC - Abnormal; Notable for the following components:   WBC 13.0 (*)    All other components within normal limits    EKG EKG Interpretation  Date/Time:  Sunday May 05 2018 15:03:27 EST Ventricular Rate:  119 PR Interval:    QRS Duration: 77 QT Interval:  302 QTC Calculation: 425 R Axis:   87 Text Interpretation:  Sinus tachycardia Right atrial enlargement Borderline right axis deviation Minimal ST depression, inferior leads No significant change was found Confirmed by Jola Schmidt [phone]) on 05/05/2018 4:33:02 PM   Radiology Dg Chest Portable 1 View  Result Date: 05/05/2018 CLINICAL DATA:  Worsening dyspnea EXAM: PORTABLE CHEST 1 VIEW COMPARISON:  None. FINDINGS: Emphysematous hyperinflation of the lungs without acute pulmonary consolidation, edema, effusion or pneumothorax. Heart size and mediastinal contours are stable and within normal limits. There is minimal aortic atherosclerosis at the arch. No acute osseous abnormality. IMPRESSION: Emphysematous hyperinflation of the lungs without acute pulmonary disease. Electronically Signed   By: Ashley Royalty M.D.   On: 05/05/2018 15:48    Procedures .Critical Care Performed by: Jola Schmidt, MD Authorized by: Jola Schmidt, MD   Critical care provider statement:    Critical care time (minutes):  32   Critical care was time spent personally by me on the following activities:  Discussions with consultants, evaluation of patient's response to treatment, examination of patient, ordering and performing treatments and interventions, ordering and review of laboratory studies, ordering and review of radiographic studies, pulse oximetry, re-evaluation of  patient's condition, obtaining history from patient or surrogate and review of old charts     Medications Ordered in ED Medications  albuterol (PROVENTIL) (2.5 MG/3ML) 0.083% nebulizer solution 5 mg (5 mg Nebulization Given 05/05/18 1644)  ipratropium (ATROVENT) nebulizer solution 0.5 mg (0.5 mg Nebulization Given 05/05/18 1644)     Initial Impression / Assessment and Plan / ED Course  I have reviewed the triage vital signs and the nursing notes.  Pertinent labs & imaging results that were available during my care of the patient were reviewed by me and considered in my medical decision making (see chart for details).     Respiratory distress on arrival.  Maintained on BiPAP.  Additional bronchodilators given.  Patient with improvement in the emergency department.  She was able to be weaned down to oxygen.  I attempted to take her off of oxygen but she remained hypoxic.  She was placed back on 2 L nasal cannula.  She is received steroids and bronchodilators.  Chest x-ray shows no infiltrate.  She will be admitted the hospital for COPD exacerbation.  She does appear to be improving however  Final Clinical Impressions(s) / ED Diagnoses   Final diagnoses:  COPD exacerbation [facility])  Hypoxia    ED Discharge Orders    None       Jola Schmidt, MD 05/05/18 [phone]

## 2018-05-05 NOTE — ED Notes (Signed)
Bed: KD98 Expected date:  Expected time:  Means of arrival:  Comments: RES B

## 2018-05-05 NOTE — ED Notes (Signed)
Patient placed on bipap

## 2018-05-05 NOTE — Progress Notes (Signed)
Pt currently on 3 LPM [place] and tolerating well at this time.  Pt in no noted distress, BIPAP not indicated.  RT to monitor and assess as needed.

## 2018-05-05 NOTE — ED Notes (Signed)
Respiratory called for breathing treatment.

## 2018-05-05 NOTE — ED Notes (Signed)
Bed: RESB Expected date:  Expected time:  Means of arrival:  Comments: COPD  On CPAP

## 2018-05-05 NOTE — ED Triage Notes (Addendum)
Patient arrived via [facility] from home. Patient c/o shob x3 days. Inhaler and neb treatment at home without relief.   Given via [facility]: 10 mg albuterol  1mg  Atrovent  125 solumedrol  2g mag drip infusing   On arrival with ems: diminished lungsounds and  94% RA rr-40 p-120  bp-180/100 p-125 rr-30 99%-cpap  Lung sounds are now wheezing all around  Denies pain or n/v  Usually ambulatory   Hx. COPD

## 2018-05-06 ENCOUNTER — Other Ambulatory Visit: Payer: Self-pay

## 2018-05-06 DIAGNOSIS — F419 Anxiety disorder, unspecified: Secondary | ICD-10-CM

## 2018-05-06 DIAGNOSIS — J441 Chronic obstructive pulmonary disease with (acute) exacerbation: Principal | ICD-10-CM

## 2018-05-06 DIAGNOSIS — J9601 Acute respiratory failure with hypoxia: Secondary | ICD-10-CM

## 2018-05-06 DIAGNOSIS — I471 Supraventricular tachycardia: Secondary | ICD-10-CM

## 2018-05-06 DIAGNOSIS — F329 Major depressive disorder, single episode, unspecified: Secondary | ICD-10-CM

## 2018-05-06 LAB — CBC
HCT: 41.2 % (ref 36.0–46.0)
HEMOGLOBIN: 12.8 g/dL (ref 12.0–15.0)
MCH: 29.8 pg (ref 26.0–34.0)
MCHC: 31.1 g/dL (ref 30.0–36.0)
MCV: 96 fL (ref 80.0–100.0)
Platelets: 330 10*3/uL (ref 150–400)
RBC: 4.29 MIL/uL (ref 3.87–5.11)
RDW: 13.5 % (ref 11.5–15.5)
WBC: 7.5 10*3/uL (ref 4.0–10.5)
nRBC: 0 % (ref 0.0–0.2)

## 2018-05-06 LAB — BASIC METABOLIC PANEL
ANION GAP: 12 (ref 5–15)
BUN: 22 mg/dL (ref 8–23)
CO2: 19 mmol/L — ABNORMAL LOW (ref 22–32)
Calcium: 8.7 mg/dL — ABNORMAL LOW (ref 8.9–10.3)
Chloride: 108 mmol/L (ref 98–111)
Creatinine, Ser: 0.59 mg/dL (ref 0.44–1.00)
GFR calc Af Amer: >60 mL/min (ref >60)
GFR calc non Af Amer: >60 mL/min (ref >60)
Glucose, Bld: 103 mg/dL — ABNORMAL HIGH (ref 70–99)
Potassium: 4.5 mmol/L (ref 3.5–5.1)
Sodium: 139 mmol/L (ref 135–145)

## 2018-05-06 LAB — MRSA PCR SCREENING: MRSA by PCR: NEGATIVE

## 2018-05-06 MED ORDER — ORAL CARE MOUTH RINSE
15.0000 mL | Freq: Two times a day (BID) | OROMUCOSAL | Status: DC
  Administered 2018-05-06 – 2018-05-07 (×4): 15 mL via OROMUCOSAL

## 2018-05-06 NOTE — Progress Notes (Signed)
PROGRESS NOTE   Lori Butler  ZCH:[id number]    DOB: Sep 05, 1950    DOA: 05/05/2018  PCP: [name], MD   I have briefly reviewed patients previous medical records in [facility].  Brief Narrative:  67 year old female, lives alone, independent, PMH of COPD not on home oxygen, anxiety and depression, prior suicide attempt, former tobacco abuse, recently had her tooth pulled under nitrous oxide on 04/29/2018, presented to [facility] ED on 05/05/2018 due to 3 days history of progressive dyspnea and cough productive of white sputum, failed home treatment including nebulizers.  On route to ED, she was placed on BiPAP by EMS and was administered albuterol, Atrovent nebulization, IV Solu-Medrol 125 mg and magnesium 2 g.  Admitted to stepdown unit for COPD exacerbation and acute respiratory failure with hypoxia.  Improved.   Assessment & Plan:   Principal Problem:   Acute exacerbation of chronic obstructive pulmonary disease (HCC) Active Problems:   Anxiety and depression   1. COPD exacerbation: Possibly precipitated by acute viral bronchitis.  Treated with scheduled duo nebs, PRN albuterol nebulizations, IV Solu-Medrol 40 mg every 12 hourly, oral azithromycin, continued Spiriva, Singulair and Dulera.  Clinically improving.  Did not require BiPAP overnight in stepdown unit.  Consider transferring to medical floor later today. 2. Acute respiratory failure with hypoxia: Secondary to COPD exacerbation.  Treat as above and wean oxygen as tolerated for saturations between 89-92% and reassess oxygen needs prior to discharge. 3. Anxiety and depression: Stable without suicidal or homicidal ideations.  Continue Zoloft.  History of recent admission for intentional drug overdose with Wellbutrin. 4. Multifocal atrial tachycardia: Likely related to acute respiratory issues.  Treat underlying cause.  Monitor on telemetry.    DVT prophylaxis: Lovenox Code Status: Full Family Communication:  None at bedside Disposition: Possible transfer to medical bed later today and DC home when medically stable, possibly in 48 hours.   Consultants:  None  Procedures:  None  Antimicrobials:  Azithromycin   Subjective: "I feel 100% better".  Dyspnea improved but still has dyspnea on exertion.  No chest pain.  Cough mild and dry.  No chest pain.  Asking why she is still in the ICU and not transferred to a regular bed.  ROS: As above, otherwise negative.  Objective:  Vitals:   05/06/18 0900 05/06/18 1000 05/06/18 1100 05/06/18 1200  BP: 135/71 (!) 108/54 115/64   Pulse: (!) 104 (!) 133 91   Resp: 18 (!) 22 (!) 22   Temp:    97.8 F (36.6 C)  TempSrc:    Oral  SpO2: 98% 95% 97%   Weight:      Height:        Examination:  General exam: Pleasant middle-aged female, moderately built and nourished lying comfortably propped up in bed. Respiratory system: Slightly harsh and diminished breath sounds bilaterally with occasional expiratory rhonchi but no crackles.  No increased work of breathing. Cardiovascular system: S1 & S2 heard, RRR. No JVD, murmurs, rubs, gallops or clicks. No pedal edema.  Telemetry personally reviewed: Suspect multifocal atrial tachycardia with heart rates in the 100s-110s. Gastrointestinal system: Abdomen is nondistended, soft and nontender. No organomegaly or masses felt. Normal bowel sounds heard. Central nervous system: Alert and oriented. No focal neurological deficits. Extremities: Symmetric 5 x 5 power. Skin: No rashes, lesions or ulcers Psychiatry: Judgement and insight appear normal. Mood & affect appropriate.     Data Reviewed: I have personally reviewed following labs and imaging studies  CBC: Recent  Labs  Lab 05/05/18 1507 05/06/18 0715  WBC 13.0* 7.5  HGB 13.0 12.8  HCT 40.9 41.2  MCV 94.2 96.0  PLT 383 676   Basic Metabolic Panel: Recent Labs  Lab 05/05/18 1507 05/06/18 0715  NA 140 139  K 3.8 4.5  CL 110 108  CO2 21* 19*    GLUCOSE 128* 103*  BUN 21 22  CREATININE 0.68 0.59  CALCIUM 8.5* 8.7*     Recent Results (from the past 240 hour(s))  MRSA PCR Screening     Status: None   Collection Time: 05/05/18  9:05 PM  Result Value Ref Range Status   MRSA by PCR NEGATIVE NEGATIVE Final    Comment:        The GeneXpert MRSA Assay (FDA approved for NASAL specimens only), is one component of a comprehensive MRSA colonization surveillance program. It is not intended to diagnose MRSA infection nor to guide or monitor treatment for MRSA infections. Performed at [facility], [place] [address] [place], [place] [address]          Radiology Studies: Dg Chest Portable 1 View  Result Date: 05/05/2018 CLINICAL DATA:  Worsening dyspnea EXAM: PORTABLE CHEST 1 VIEW COMPARISON:  None. FINDINGS: Emphysematous hyperinflation of the lungs without acute pulmonary consolidation, edema, effusion or pneumothorax. Heart size and mediastinal contours are stable and within normal limits. There is minimal aortic atherosclerosis at the arch. No acute osseous abnormality. IMPRESSION: Emphysematous hyperinflation of the lungs without acute pulmonary disease. Electronically Signed   By: Ashley Royalty M.D.   On: 05/05/2018 15:48        Scheduled Meds: . azithromycin  250 mg Oral Daily  . enoxaparin (LOVENOX) injection  40 mg Subcutaneous Q24H  . guaiFENesin  600 mg Oral BID  . ipratropium-albuterol  3 mL Nebulization Q6H  . mouth rinse  15 mL Mouth Rinse BID  . methylPREDNISolone (SOLU-MEDROL) injection  40 mg Intravenous Q12H  . mometasone-formoterol  2 puff Inhalation BID  . montelukast  10 mg Oral QHS  . pantoprazole  40 mg Oral BID  . sertraline  50 mg Oral Daily  . sodium chloride flush  3 mL Intravenous Q12H  . umeclidinium bromide  1 puff Inhalation Daily   Continuous Infusions:   LOS: 1 day     Vernell Leep, MD, FACP, [facility]. [facility] Pager [phone]  If 7PM-7AM, please  contact night-coverage [email or web address] Password [email or web address] 05/06/2018, 12:04 PM

## 2018-05-06 NOTE — Progress Notes (Signed)
Pt. Monitor showing A.fib. RN took a 12-lead EKG to check for accuracy and pt. Was then labeled as ST. RN will continue to monitor pt.

## 2018-05-06 NOTE — Progress Notes (Signed)
PT demonstrated hands on understanding of Flutter device. 

## 2018-05-06 NOTE — Progress Notes (Signed)
PT continues to demonstrate hands on understanding of Fltter device. Productive cough at this time (thick, white).

## 2018-05-07 MED ORDER — IPRATROPIUM-ALBUTEROL 0.5-2.5 (3) MG/3ML IN SOLN
3.0000 mL | Freq: Three times a day (TID) | RESPIRATORY_TRACT | Status: DC
  Administered 2018-05-08: 3 mL via RESPIRATORY_TRACT
  Filled 2018-05-07: qty 3

## 2018-05-07 NOTE — Progress Notes (Signed)
Patient was transferred from ICU at 1135. Alert and oriented x 4. Vital signs was taken. Call light was in patient's reach.

## 2018-05-07 NOTE — Progress Notes (Signed)
PROGRESS NOTE   Lori Butler  [id number]:[id number]    DOB: 04-16-1951    DOA: 05/05/2018  PCP: [name], MD   I have briefly reviewed patients previous medical records in [facility].  Brief Narrative:  67 year old female, lives alone, independent, PMH of COPD not on home oxygen, anxiety and depression, prior suicide attempt, former tobacco abuse, recently had her tooth pulled under nitrous oxide on 04/29/2018, presented to [facility] ED on 05/05/2018 due to 3 days history of progressive dyspnea and cough productive of white sputum, failed home treatment including nebulizers.  On route to ED, she was placed on BiPAP by EMS and was administered albuterol, Atrovent nebulization, IV Solu-Medrol 125 mg and magnesium 2 g.  Admitted to stepdown unit for COPD exacerbation and acute respiratory failure with hypoxia.  Improved.  Transferred to medical floor 12/31 and possible discharge home tomorrow pending clinical improvement.   Assessment & Plan:   Principal Problem:   Acute exacerbation of chronic obstructive pulmonary disease (HCC) Active Problems:   Anxiety and depression   1. COPD exacerbation: Possibly precipitated by acute viral bronchitis.  Treated with scheduled duo nebs, PRN albuterol nebulizations, IV Solu-Medrol 40 mg every 12 hourly, oral azithromycin, continued Spiriva, Singulair and Dulera.  Has not required BiPAP since ED.  Continues to clinically improve.  Still with some dyspnea on exertion and has not been out of bed.  Ambulate as tolerated and monitor clinically and oxygenation.  Transfer to medical floor today and possible discharge home in a.m. 2. Acute respiratory failure with hypoxia: Secondary to COPD exacerbation.  Now not hypoxic on room air at rest but need to ambulate.  Check home oxygen requirement prior to discharge. 3. Anxiety and depression: Stable without suicidal or homicidal ideations.  Continue Zoloft.  History of recent admission for intentional drug  overdose with Wellbutrin.  Stable. 4. Multifocal atrial tachycardia: Likely related to acute respiratory issues.  Treat underlying cause.  Stable.    DVT prophylaxis: Lovenox Code Status: Full Family Communication: None at bedside Disposition: Transfer to medical bed 12/31.  Possible DC home tomorrow.   Consultants:  None  Procedures:  None  Antimicrobials:  Azithromycin   Subjective: Overall feels much better.  Improved breathing and approaching baseline.  Still mild dyspnea on exertion but has not been out of bed.  ROS: As above, otherwise negative.  Objective:  Vitals:   05/07/18 0500 05/07/18 0600 05/07/18 0605 05/07/18 0749  BP: 108/64 101/63    Pulse: 93 91 95   Resp: (!) 21 20 (!) 22   Temp:      TempSrc:      SpO2: 92% 93% 94% 95%  Weight:      Height:        Examination:  General exam: Pleasant middle-aged female, moderately built and nourished lying comfortably propped up in bed.  Undergoing nebulization this morning. Respiratory system: Improved breath sounds.  Minimally harsh.  Occasional rhonchi.  No increased work of breathing. Cardiovascular system: S1 & S2 heard, RRR. No JVD, murmurs, rubs, gallops or clicks. No pedal edema.  Gastrointestinal system: Abdomen is nondistended, soft and nontender. No organomegaly or masses felt. Normal bowel sounds heard.  Stable. Central nervous system: Alert and oriented. No focal neurological deficits. Extremities: Symmetric 5 x 5 power. Skin: No rashes, lesions or ulcers Psychiatry: Judgement and insight appear normal. Mood & affect appropriate.     Data Reviewed: I have personally reviewed following labs and imaging studies  CBC: Recent Labs  Lab 05/05/18 1507 05/06/18 0715  WBC 13.0* 7.5  HGB 13.0 12.8  HCT 40.9 41.2  MCV 94.2 96.0  PLT 383 616   Basic Metabolic Panel: Recent Labs  Lab 05/05/18 1507 05/06/18 0715  NA 140 139  K 3.8 4.5  CL 110 108  CO2 21* 19*  GLUCOSE 128* 103*  BUN 21 22   CREATININE 0.68 0.59  CALCIUM 8.5* 8.7*     Recent Results (from the past 240 hour(s))  MRSA PCR Screening     Status: None   Collection Time: 05/05/18  9:05 PM  Result Value Ref Range Status   MRSA by PCR NEGATIVE NEGATIVE Final    Comment:        The GeneXpert MRSA Assay (FDA approved for NASAL specimens only), is one component of a comprehensive MRSA colonization surveillance program. It is not intended to diagnose MRSA infection nor to guide or monitor treatment for MRSA infections. Performed at [facility], [place] [address] [place] , [place] [address]          Radiology Studies: Dg Chest Portable 1 View  Result Date: 05/05/2018 CLINICAL DATA:  Worsening dyspnea EXAM: PORTABLE CHEST 1 VIEW COMPARISON:  None. FINDINGS: Emphysematous hyperinflation of the lungs without acute pulmonary consolidation, edema, effusion or pneumothorax. Heart size and mediastinal contours are stable and within normal limits. There is minimal aortic atherosclerosis at the arch. No acute osseous abnormality. IMPRESSION: Emphysematous hyperinflation of the lungs without acute pulmonary disease. Electronically Signed   By: Ashley Royalty M.D.   On: 05/05/2018 15:48        Scheduled Meds: . azithromycin  250 mg Oral Daily  . enoxaparin (LOVENOX) injection  40 mg Subcutaneous Q24H  . guaiFENesin  600 mg Oral BID  . ipratropium-albuterol  3 mL Nebulization Q6H  . mouth rinse  15 mL Mouth Rinse BID  . methylPREDNISolone (SOLU-MEDROL) injection  40 mg Intravenous Q12H  . mometasone-formoterol  2 puff Inhalation BID  . montelukast  10 mg Oral QHS  . pantoprazole  40 mg Oral BID  . sertraline  50 mg Oral Daily  . sodium chloride flush  3 mL Intravenous Q12H  . umeclidinium bromide  1 puff Inhalation Daily   Continuous Infusions:   LOS: 2 days     Vernell Leep, MD, FACP, [facility]. [facility] Pager [phone]  If 7PM-7AM, please contact  night-coverage [email or web address] Password [email or web address] 05/07/2018, 8:15 AM

## 2018-05-08 LAB — BASIC METABOLIC PANEL
Anion gap: 11 (ref 5–15)
BUN: 31 mg/dL — ABNORMAL HIGH (ref 8–23)
CALCIUM: 8.5 mg/dL — AB (ref 8.9–10.3)
CO2: 20 mmol/L — ABNORMAL LOW (ref 22–32)
Chloride: 108 mmol/L (ref 98–111)
Creatinine, Ser: 0.65 mg/dL (ref 0.44–1.00)
GFR calc Af Amer: >60 mL/min (ref >60)
Glucose, Bld: 100 mg/dL — ABNORMAL HIGH (ref 70–99)
Potassium: 4.1 mmol/L (ref 3.5–5.1)
Sodium: 139 mmol/L (ref 135–145)

## 2018-05-08 MED ORDER — AZITHROMYCIN 250 MG PO TABS: 250.0000 mg | ORAL_TABLET | Freq: Once | ORAL | 0 refills | Status: AC

## 2018-05-08 MED ORDER — PREDNISONE 20 MG PO TABS
40.0000 mg | ORAL_TABLET | Freq: Every day | ORAL | Status: DC
  Administered 2018-05-08: 40 mg via ORAL
  Filled 2018-05-08: qty 2

## 2018-05-08 MED ORDER — PREDNISONE 10 MG PO TABS: ORAL_TABLET | ORAL | 0 refills | Status: DC

## 2018-05-08 NOTE — Progress Notes (Signed)
SATURATION QUALIFICATIONS: (This note is used to comply with regulatory documentation for home oxygen)  Patient Saturations on Room Air at Rest = 95%  Patient Saturations on Room Air while Ambulating = 92%  Patient Saturations on 0 Liters of oxygen while Ambulating = %  Please briefly explain why patient needs home oxygen: Oxygen was not needed.

## 2018-05-08 NOTE — Discharge Summary (Signed)
Physician Discharge Summary  Lori Butler ZOX:[id number] DOB: [birth year]  PCP: Lori Lass, MD  Admit date: 05/05/2018 Discharge date: 05/08/2018  Recommendations for Outpatient Follow-up:  1. Dr. Kathyrn Butler, PCP in 1 week with repeat labs (CBC & BMP). 2. Dr. [name], Pulmonology in 2 weeks.  Home Health: None Equipment/Devices: None  Discharge Condition: Improved and stable CODE STATUS: Full Diet recommendation: Heart healthy diet.  Discharge Diagnoses:  Principal Problem:   Acute exacerbation of chronic obstructive pulmonary disease (HCC) Active Problems:   Anxiety and depression   Brief Summary: 68 year old female, lives alone, independent, PMH of COPD not on home oxygen, anxiety and depression, prior suicide attempt, former tobacco abuse, recently had her tooth pulled under nitrous oxide on 04/29/2018, presented to [facility] ED on 05/05/2018 due to 3 days history of progressive dyspnea and cough productive of white sputum, failed home treatment including nebulizers.  On route to ED, she was placed on BiPAP by EMS and was administered albuterol, Atrovent nebulization, IV Solu-Medrol 125 mg and magnesium 2 g.  Admitted to stepdown unit for COPD exacerbation and acute respiratory failure with hypoxia.     Assessment & Plan:   1. COPD exacerbation: Possibly precipitated by acute viral bronchitis.  Treated with scheduled duo nebs, PRN albuterol nebulizations, IV Solu-Medrol 40 mg every 12 hourly, oral azithromycin, continued Spiriva, Singulair and Dulera.  Has not required BiPAP since ED. Clinically improved.  Not hypoxic even with activity.  Discharged on oral prednisone taper and complete course of azithromycin.  Discontinue Combivent inhaler due to duplication of meds.  Outpatient follow-up with PCP/pulmonology. 2. Acute respiratory failure with hypoxia: Secondary to COPD exacerbation.   Resolved. 3. Anxiety and depression: Stable without suicidal or homicidal  ideations.  Continue Zoloft.  History of recent admission for intentional drug overdose with Wellbutrin.   4. Multifocal atrial tachycardia: Likely related to acute respiratory issues.  Treated underlying cause.  Resolved.   Consultants:  None  Procedures:  None  Discharge Instructions  Discharge Instructions    Call MD for:  difficulty breathing, headache or visual disturbances   Complete by:  As directed    Call MD for:  extreme fatigue   Complete by:  As directed    Call MD for:  persistant dizziness or light-headedness   Complete by:  As directed    Call MD for:  temperature >100.4   Complete by:  As directed    Diet - low sodium heart healthy   Complete by:  As directed    Increase activity slowly   Complete by:  As directed        Medication List    STOP taking these medications   COMBIVENT RESPIMAT 20-100 MCG/ACT Aers respimat Generic drug:  Ipratropium-Albuterol     TAKE these medications   acetaminophen 500 MG tablet Commonly known as:  TYLENOL Take 1,000 mg by mouth every 6 (six) hours as needed for mild pain or headache.   albuterol (2.5 MG/3ML) 0.083% nebulizer solution Commonly known as:  PROVENTIL Take 3 mLs (2.5 mg total) by nebulization every 4 (four) hours as needed for wheezing or shortness of breath.   albuterol 108 (90 Base) MCG/ACT inhaler Commonly known as:  PROVENTIL HFA;VENTOLIN HFA Inhale 2 puffs into the lungs every 6 (six) hours as needed for wheezing or shortness of breath.   alendronate 70 MG tablet Commonly known as:  FOSAMAX Take 70 mg by mouth every Wednesday.   azithromycin 250 MG tablet Commonly known as:  ZITHROMAX Take 1 tablet (250 mg total) by mouth once for 1 dose. Last dose on 05/09/2018. Start taking on:  May 09, 2018   budesonide-formoterol 160-4.5 MCG/ACT inhaler Commonly known as:  SYMBICORT Inhale 2 puffs into the lungs 2 (two) times daily.   guaiFENesin 600 MG 12 hr tablet Commonly known as:   MUCINEX Take 600 mg by mouth 2 (two) times daily.   montelukast 10 MG tablet Commonly known as:  SINGULAIR Take 1 tablet (10 mg total) by mouth at bedtime.   pantoprazole 40 MG tablet Commonly known as:  PROTONIX Take 1 tablet (40 mg total) by mouth 2 (two) times daily.   predniSONE 10 MG tablet Commonly known as:  DELTASONE Take 4 tabs daily for 2 days, then 3 tabs daily for 2 days, then 2 tabs daily for 2 days, then 1 tab daily for 2 days, then stop. Start taking on:  May 09, 2018   sertraline 50 MG tablet Commonly known as:  ZOLOFT Take 1 tablet (50 mg total) by mouth daily.   tiotropium 18 MCG inhalation capsule Commonly known as:  SPIRIVA HANDIHALER Place 1 capsule (18 mcg total) into inhaler and inhale daily.   Vitamin D 50 MCG (2000 UT) tablet Take 2,000 Units by mouth daily.      Follow-up Information    Lori Lass, MD. Schedule an appointment as soon as possible for a visit in 1 week(s).   Specialty:  Family Medicine Why:  To be seen with repeat labs (CBC & BMP). Contact information: [place] [address] [phone]        Lori Noel, MD. Schedule an appointment as soon as possible for a visit in 2 week(s).   Specialty:  Pulmonary Disease Contact information: [place] [address] [phone]          Allergies  Allergen Reactions  . Demerol [Meperidine] Other (See Comments)    "horrible reaction"  . Oxycodone-Acetaminophen Other (See Comments)    Upset stomach   . Tramadol Nausea And Vomiting      Procedures/Studies: Dg Chest Portable 1 View  Result Date: 05/05/2018 CLINICAL DATA:  Worsening dyspnea EXAM: PORTABLE CHEST 1 VIEW COMPARISON:  None. FINDINGS: Emphysematous hyperinflation of the lungs without acute pulmonary consolidation, edema, effusion or pneumothorax. Heart size and mediastinal contours are stable and within normal limits. There is minimal aortic atherosclerosis at the  arch. No acute osseous abnormality. IMPRESSION: Emphysematous hyperinflation of the lungs without acute pulmonary disease. Electronically Signed   By: Ashley Royalty M.D.   On: 05/05/2018 15:48      Subjective: Reports that she feels "fine".  Denies complaints.  Breathing is back to baseline.  No cough.  As per RN, ambulated without oxygen and not hypoxic.  Discharge Exam:  Vitals:   05/07/18 2052 05/07/18 2149 05/08/18 0500 05/08/18 0826  BP: 118/73  127/70   Pulse: 94  95   Resp: 20  17   Temp:   97.6 F (36.4 C)   TempSrc:   Oral   SpO2: 95% 92% 91% 94%  Weight:      Height:        General exam: Pleasant middle-aged female, moderately built and nourished lying comfortably propped up in bed.  Respiratory system:  Clear to auscultation.  No increased work of breathing. Cardiovascular system: S1 & S2 heard, RRR. No JVD, murmurs, rubs, gallops or clicks. No pedal edema.  Gastrointestinal system: Abdomen is nondistended,  soft and nontender. No organomegaly or masses felt. Normal bowel sounds heard.   Central nervous system: Alert and oriented. No focal neurological deficits. Extremities: Symmetric 5 x 5 power. Skin: No rashes, lesions or ulcers Psychiatry: Judgement and insight appear normal. Mood & affect appropriate.    The results of significant diagnostics from this hospitalization (including imaging, microbiology, ancillary and laboratory) are listed below for reference.     Microbiology: Recent Results (from the past 240 hour(s))  MRSA PCR Screening     Status: None   Collection Time: 05/05/18  9:05 PM  Result Value Ref Range Status   MRSA by PCR NEGATIVE NEGATIVE Final    Comment:        The GeneXpert MRSA Assay (FDA approved for NASAL specimens only), is one component of a comprehensive MRSA colonization surveillance program. It is not intended to diagnose MRSA infection nor to guide or monitor treatment for MRSA infections. Performed at [facility], [place] [address] [place], [place] [address]      Labs: CBC: Recent Labs  Lab 05/05/18 1507 05/06/18 0715  WBC 13.0* 7.5  HGB 13.0 12.8  HCT 40.9 41.2  MCV 94.2 96.0  PLT 383 786   Basic Metabolic Panel: Recent Labs  Lab 05/05/18 1507 05/06/18 0715 05/08/18 0621  NA 140 139 139  K 3.8 4.5 4.1  CL 110 108 108  CO2 21* 19* 20*  GLUCOSE 128* 103* 100*  BUN 21 22 31*  CREATININE 0.68 0.59 0.65  CALCIUM 8.5* 8.7* 8.5*      Time coordinating discharge: 25 minutes  SIGNED:  Vernell Leep, MD, FACP, [facility]. [facility] Pager [phone]  If 7PM-7AM, please contact night-coverage [email or web address] Password [facility] 05/08/2018, 11:39 AM

## 2018-05-08 NOTE — Discharge Instructions (Signed)

## 2018-05-14 ENCOUNTER — Other Ambulatory Visit: Payer: Self-pay

## 2018-05-14 NOTE — Patient Outreach (Signed)
[facility]) Care Management  05/14/2018   Lori Butler 11/19/1950 [id number]      Outreach attempt # 1 to the patient for initial assessment. HIPAA verified with patient.  Discussed and offered [facility] care management services with patient. Patient verbally agreed to services.   Social:  Patient lives at home alone.  Independent with ADLS and IADLS.  Ambulates independently and denies any falls in the past year.   The patient is able to drive herself to her medical appointments.  Durable Medical Equipment in the home include:   nebulizer, eyeglasses, dentures, and portable pulse oximetry.    Conditions: Per chart review and conversation with patient, her conditions include:  COPD, arthritis, renal stones, aortic atherosclerosis, bilateral cataract extractions, and appendectomy.  Patient has had 5 hospitalizations related to Acute Respiratory Distress.  Reports being newly diagnosed with COPD in May 2019 .   Denies any shortness of breath currently.  Reports having to use her rescue inhaler 1-3 times this week due to  activity and exercise.  Patient states she continues to do her breathing exercises with the incentive spirometry.   Medications:  Reports taking about ten medications.  States she manages her medications herself.  She did not express any problems with paying for her medications.  Advanced Directives: The patient has an advanced directive a healthcare power of attorney and living will.     Current Medications:  Current Outpatient Medications  Medication Sig Dispense Refill  . acetaminophen (TYLENOL) 500 MG tablet Take 1,000 mg by mouth every 6 (six) hours as needed for mild pain or headache.    . albuterol (PROVENTIL HFA;VENTOLIN HFA) 108 (90 Base) MCG/ACT inhaler Inhale 2 puffs into the lungs every 6 (six) hours as needed for wheezing or shortness of breath. 1 Inhaler 2  . albuterol (PROVENTIL) (2.5 MG/3ML) 0.083% nebulizer solution Take 3 mLs (2.5 mg  total) by nebulization every 4 (four) hours as needed for wheezing or shortness of breath. 75 mL 12  . alendronate (FOSAMAX) 70 MG tablet Take 70 mg by mouth every Wednesday.     . budesonide-formoterol (SYMBICORT) 160-4.5 MCG/ACT inhaler Inhale 2 puffs into the lungs 2 (two) times daily. 1 Inhaler 12  . Cholecalciferol (VITAMIN D) 50 MCG (2000 UT) tablet Take 2,000 Units by mouth daily.    Mar[place] Kitchen guaiFENesin (MUCINEX) 600 MG 12 hr tablet Take 600 mg by mouth 2 (two) times daily.    . montelukast (SINGULAIR) 10 MG tablet Take 1 tablet (10 mg total) by mouth at bedtime. 30 tablet 1  . pantoprazole (PROTONIX) 40 MG tablet Take 1 tablet (40 mg total) by mouth 2 (two) times daily. 60 tablet 1  . predniSONE (DELTASONE) 10 MG tablet Take 4 tabs daily for 2 days, then 3 tabs daily for 2 days, then 2 tabs daily for 2 days, then 1 tab daily for 2 days, then stop. 20 tablet 0  . sertraline (ZOLOFT) 50 MG tablet Take 1 tablet (50 mg total) by mouth daily. 30 tablet 0  . tiotropium (SPIRIVA HANDIHALER) 18 MCG inhalation capsule Place 1 capsule (18 mcg total) into inhaler and inhale daily. 30 capsule 11   No current facility-administered medications for this visit.     Functional Status:  In your present state of health, do you have any difficulty performing the following activities: 05/14/2018 05/06/2018  Hearing? N N  Vision? N N  Difficulty concentrating or making decisions? N N  Walking or climbing stairs? N N  Dressing or  bathing? N N  Doing errands, shopping? N N  Preparing Food and eating ? - -  Using the Toilet? - -  In the past six months, have you accidently leaked urine? - -  Do you have problems with loss of bowel control? - -  Managing your Medications? - -  Managing your Finances? - -  Housekeeping or managing your Housekeeping? - -  Some encounter information is confidential and restricted. Go to Review Flowsheets activity to see all data.  Some recent data might be hidden     Fall/Depression Screening: Fall Risk  05/14/2018 03/01/2018 01/22/2018  Falls in the past year? 0 No No   PHQ 2/9 Scores 05/14/2018 03/01/2018 01/22/2018  PHQ - 2 Score 1 1 1     Assessment: Patient will benefit from health coach outreach for disease management and support.   THN CM Care Plan Problem One     Most Recent Value  Care Plan Problem One  Knowledge deficit related diease management of COPD  Role Documenting the Problem One  Health Coach  Care Plan for Problem One  Active  THN Long Term Goal   Patient will have no hospitalizations in the next 30 days.  THN Long Term Goal Start Date  05/14/18  Interventions for Problem One Long Term Goal  Reviewed signs and symptoms of COPD, discussed the action plan and medication adherence       Plan:RN Health Coach will provide ongoing education for patient on COPD through phone calls and sending printed information to patient for further discussion.  RN Health Coach will send welcome packet with consent to patient as well as printed information on COPD.  RN Health Coach will send initial barriers letter, assessment, and care plan to primary care physician. RN Health Coach will contact patient in the month of February and patient agrees to next outreach.   Lazaro Arms RN, BSN, [place] Direct Dial:  [phone]  Fax: [phone]

## 2018-05-19 DIAGNOSIS — J449 Chronic obstructive pulmonary disease, unspecified: Secondary | ICD-10-CM | POA: Diagnosis not present

## 2018-05-20 DIAGNOSIS — Z6826 Body mass index (BMI) 26.0-26.9, adult: Secondary | ICD-10-CM | POA: Diagnosis not present

## 2018-05-20 DIAGNOSIS — J449 Chronic obstructive pulmonary disease, unspecified: Secondary | ICD-10-CM | POA: Diagnosis not present

## 2018-05-27 NOTE — Progress Notes (Signed)
@Patient  ID: Lori Butler, female    DOB: 04-29-1951, 68 y.o.   MRN: [id number]  Chief Complaint  Patient presents with  . Hospitalization Follow-up    Has had some chest tightness she is taking predisone at this time.    Referring provider: [name], MD  HPI: 68 year old female, former smoker quit smoking 09/2017. PMH significant for COPD, hypoxia, anxiety/depression, attempted suicide, tobacco abuse. Patient of Dr. Elsworth Soho, last seen by pulmonary NP on 04/15/18 for thursh symptoms. Recently hospitalized for COPD exacerbation likely d/t viral bronchitis in December 2019. Maintained on Symbicort and Spiriva.   Hospital course 05/05/18-05/08/18: Presented to ED with worseing dyspnea and cough x 3 days. Placed on BiPAP by EMS. Admitted to stepdown unit. Treated with Solu-medrol 40mg  q12hrs, oral azithromycin. Continues Spiriva, Dulera and Singulair. Discontinued Combivent inhaler d/t duplication of medications.   05/28/2018 Patient presents today for hospital follow-up. Reports that chest tightness returned last week. Called PCP and was sent in a prednisone taper which she started 2 days ago. States her symptoms are not nearly as bad but still short of breath. Cough has gotten better. Currently taking Symbicort twice daily, Spiriva daily and Combivent twice a day as needed. Afebrile.    Allergies  Allergen Reactions  . Demerol [Meperidine] Other (See Comments)    "horrible reaction"  . Oxycodone-Acetaminophen Other (See Comments)    Upset stomach   . Tramadol Nausea And Vomiting    Immunization History  Administered Date(s) Administered  . Influenza, High Dose Seasonal PF 01/21/2018    Past Medical History:  Diagnosis Date  . Acute respiratory failure with hypoxia ([place])   . Anxiety   . Aortic atherosclerosis ([place])   . Arthritis    "a little in my hands probably" (10/31/2017)  . COPD (chronic obstructive pulmonary disease) ([place])   . COPD exacerbation ([place])   . Depression   .  History of kidney stones   . Suicide attempt ([place]) 02/21/2018   overdosed on Wellbutrin    Tobacco History: Social History   Tobacco Use  Smoking Status Former Smoker  . Packs/day: 1.50  . Years: 51.00  . Pack years: 76.50  . Types: Cigarettes  . Last attempt to quit: 09/05/2017  . Years since quitting: 0.7  Smokeless Tobacco Never Used   Counseling given: Not Answered   Outpatient Medications Prior to Visit  Medication Sig Dispense Refill  . acetaminophen (TYLENOL) 500 MG tablet Take 1,000 mg by mouth every 6 (six) hours as needed for mild pain or headache.    . albuterol (PROVENTIL HFA;VENTOLIN HFA) 108 (90 Base) MCG/ACT inhaler Inhale 2 puffs into the lungs every 6 (six) hours as needed for wheezing or shortness of breath. 1 Inhaler 2  . alendronate (FOSAMAX) 70 MG tablet Take 70 mg by mouth every Wednesday.     . budesonide-formoterol (SYMBICORT) 160-4.5 MCG/ACT inhaler Inhale 2 puffs into the lungs 2 (two) times daily. 1 Inhaler 12  . Cholecalciferol (VITAMIN D) 50 MCG (2000 UT) tablet Take 2,000 Units by mouth daily.    . COMBIVENT RESPIMAT 20-100 MCG/ACT AERS respimat INHALE 1 PUFF BY MOUTH EVERY 6 HOURS AS NEEDED    . guaiFENesin (MUCINEX) 600 MG 12 hr tablet Take 600 mg by mouth 2 (two) times daily.    . methylPREDNISolone (MEDROL DOSEPAK) 4 MG TBPK tablet     . montelukast (SINGULAIR) 10 MG tablet Take 1 tablet (10 mg total) by mouth at bedtime. 30 tablet 1  . pantoprazole (PROTONIX)  40 MG tablet Take 1 tablet (40 mg total) by mouth 2 (two) times daily. 60 tablet 1  . predniSONE (DELTASONE) 10 MG tablet Take 4 tabs daily for 2 days, then 3 tabs daily for 2 days, then 2 tabs daily for 2 days, then 1 tab daily for 2 days, then stop. 20 tablet 0  . sertraline (ZOLOFT) 50 MG tablet Take 1 tablet (50 mg total) by mouth daily. 30 tablet 0  . tiotropium (SPIRIVA HANDIHALER) 18 MCG inhalation capsule Place 1 capsule (18 mcg total) into inhaler and inhale daily. 30 capsule 11  .  albuterol (PROVENTIL) (2.5 MG/3ML) 0.083% nebulizer solution Take 3 mLs (2.5 mg total) by nebulization every 4 (four) hours as needed for wheezing or shortness of breath. 75 mL 12   No facility-administered medications prior to visit.     Review of Systems  Review of Systems  Constitutional: Negative.   HENT: Negative.   Respiratory: Positive for shortness of breath. Negative for cough, chest tightness and wheezing.   Cardiovascular: Negative.     Physical Exam  BP 122/86 (BP Location: Left Arm, Patient Position: Sitting, Cuff Size: Normal)   Pulse 82   Ht 5\' 5"  (1.651 m)   Wt 159 lb (72.1 kg)   SpO2 97%   BMI 26.46 kg/m  Physical Exam Constitutional:      General: She is not in acute distress.    Appearance: Normal appearance. She is well-developed. She is not ill-appearing.  HENT:     Head: Normocephalic and atraumatic.  Eyes:     Pupils: Pupils are equal, round, and reactive to light.  Neck:     Musculoskeletal: Normal range of motion and neck supple.  Cardiovascular:     Rate and Rhythm: Normal rate and regular rhythm.     Heart sounds: Normal heart sounds. No murmur.  Pulmonary:     Effort: Pulmonary effort is normal. No respiratory distress.     Breath sounds: Normal breath sounds. No wheezing or rhonchi.     Comments: CTA Skin:    General: Skin is warm and dry.     Findings: No erythema or rash.  Neurological:     Mental Status: She is alert and oriented to person, place, and time. Mental status is at baseline.  Psychiatric:        Mood and Affect: Mood normal.        Behavior: Behavior normal.        Thought Content: Thought content normal.        Judgment: Judgment normal.      Lab Results:  CBC    Component Value Date/Time   WBC 7.5 05/06/2018 0715   RBC 4.29 05/06/2018 0715   HGB 12.8 05/06/2018 0715   HCT 41.2 05/06/2018 0715   PLT 330 05/06/2018 0715   MCV 96.0 05/06/2018 0715   MCH 29.8 05/06/2018 0715   MCHC 31.1 05/06/2018 0715   RDW  13.5 05/06/2018 0715   LYMPHSABS 1.1 02/21/2018 1404   MONOABS 1.5 (H) 02/21/2018 1404   EOSABS 0.0 02/21/2018 1404   BASOSABS 0.0 02/21/2018 1404    BMET    Component Value Date/Time   NA 139 05/08/2018 0621   NA 140 11/06/2017   K 4.1 05/08/2018 0621   CL 108 05/08/2018 0621   CO2 20 (L) 05/08/2018 0621   GLUCOSE 100 (H) 05/08/2018 0621   BUN 31 (H) 05/08/2018 0621   BUN 17 11/06/2017   CREATININE 0.65 05/08/2018 7408  CALCIUM 8.5 (L) 05/08/2018 0621   GFRNONAA >60 05/08/2018 0621   GFRAA >60 05/08/2018 6060    BNP No results found for: BNP  ProBNP No results found for: PROBNP  Imaging: Dg Chest Portable 1 View  Result Date: 05/05/2018 CLINICAL DATA:  Worsening dyspnea EXAM: PORTABLE CHEST 1 VIEW COMPARISON:  None. FINDINGS: Emphysematous hyperinflation of the lungs without acute pulmonary consolidation, edema, effusion or pneumothorax. Heart size and mediastinal contours are stable and within normal limits. There is minimal aortic atherosclerosis at the arch. No acute osseous abnormality. IMPRESSION: Emphysematous hyperinflation of the lungs without acute pulmonary disease. Electronically Signed   By: Ashley Royalty M.D.   On: 05/05/2018 15:48     Assessment & Plan:   COPD exacerbation ([place]) - Hospitalized in December for COPD exacerbation tx with azithromycin and prednisone - Rx for second prednisone taper sent in last week for chest tightness which she started 2 days ago - Reviewed medications at length, continue Symbicort 160 BID and Spiriva qd. Stop Combivent. Continue as needed Albuterol nebulizer every 4-6 hours.  - If continues to have frequent exacerbations could try Trelegy or nebulized LAMA such as yupelri (in place of Spiriva) and consider adding daliresp. Need to keep in mind patient has some trouble with medication costs  - Follow up in March with Dr. Elsworth Soho or sooner if needed    Martyn Ehrich, NP 05/28/2018

## 2018-05-28 ENCOUNTER — Encounter: Payer: Self-pay | Admitting: Primary Care

## 2018-05-28 ENCOUNTER — Ambulatory Visit: Payer: Medicare HMO | Admitting: Primary Care

## 2018-05-28 DIAGNOSIS — J441 Chronic obstructive pulmonary disease with (acute) exacerbation: Secondary | ICD-10-CM | POA: Diagnosis not present

## 2018-05-28 MED ORDER — BUDESONIDE-FORMOTEROL FUMARATE 160-4.5 MCG/ACT IN AERO: 2.0000 | INHALATION_SPRAY | Freq: Two times a day (BID) | RESPIRATORY_TRACT | 0 refills | Status: DC

## 2018-05-28 NOTE — Assessment & Plan Note (Addendum)
-   Hospitalized in December for COPD exacerbation tx with azithromycin and prednisone - Rx for second prednisone taper sent in last week for chest tightness which she started 2 days ago - Reviewed medications at length, continue Symbicort 160 BID and Spiriva qd. Stop Combivent. Continue as needed Albuterol nebulizer every 4-6 hours.  - If continues to have frequent exacerbations could try Trelegy or nebulized LAMA such as yupelri (in place of Spiriva) and consider adding daliresp. Need to keep in mind patient has some trouble with medication costs  - Follow up in March with Dr. Elsworth Soho or sooner if needed

## 2018-05-28 NOTE — Addendum Note (Signed)
Addended by: Madolyn Frieze on: 05/28/2018 10:57 AM   Modules accepted: Orders

## 2018-05-28 NOTE — Patient Instructions (Addendum)
Maintenance: Symbicort - take 2 puffs twice daily (Inhaled corticosteroid + long acting bronchodilator) - sample given  Spiriva - take 2 puffs in am (anticholinergic)  As needed: - Albuterol nebulizer as needed every 4-6 hours for shortness of breath (short acting bronchodilator) - sample given  - STOP Combivent inhaler (doubling up on Anticholinergic and short acting BD)  Recommendations: - Continue prednisone taper as prescribed by PCP - Mucinex twice daily - Use flutter valve 2-3 times a day as needed for congestion - Use Incentive spirometer with deep breathing exercises   Follow-up: Next visit March 9th at [facility] with Dr, Elsworth Soho Return sooner if needed

## 2018-06-10 DIAGNOSIS — Z6826 Body mass index (BMI) 26.0-26.9, adult: Secondary | ICD-10-CM | POA: Diagnosis not present

## 2018-06-10 DIAGNOSIS — F322 Major depressive disorder, single episode, severe without psychotic features: Secondary | ICD-10-CM | POA: Diagnosis not present

## 2018-06-10 DIAGNOSIS — J449 Chronic obstructive pulmonary disease, unspecified: Secondary | ICD-10-CM | POA: Diagnosis not present

## 2018-06-10 DIAGNOSIS — M810 Age-related osteoporosis without current pathological fracture: Secondary | ICD-10-CM | POA: Diagnosis not present

## 2018-06-18 ENCOUNTER — Telehealth: Payer: Self-pay | Admitting: Pulmonary Disease

## 2018-06-18 MED ORDER — ALBUTEROL SULFATE HFA 108 (90 BASE) MCG/ACT IN AERS: 2.0000 | INHALATION_SPRAY | Freq: Four times a day (QID) | RESPIRATORY_TRACT | 2 refills | Status: DC | PRN

## 2018-06-18 NOTE — Telephone Encounter (Signed)
Pt requesting a refill on Albuterol.  This has been sent to preferred pharmacy.  Nothing further needed at this time- will close encounter.

## 2018-06-19 DIAGNOSIS — J449 Chronic obstructive pulmonary disease, unspecified: Secondary | ICD-10-CM | POA: Diagnosis not present

## 2018-06-21 ENCOUNTER — Telehealth: Payer: Self-pay | Admitting: Pulmonary Disease

## 2018-06-21 MED ORDER — ALBUTEROL SULFATE (2.5 MG/3ML) 0.083% IN NEBU: 2.5000 mg | INHALATION_SOLUTION | Freq: Four times a day (QID) | RESPIRATORY_TRACT | 6 refills | Status: DC | PRN

## 2018-06-21 NOTE — Telephone Encounter (Signed)
Called and spoke to pt.  Pt is requesting refill for albuterol neb solution vs albuterol HFA. Per BW last OV note on 05/28/18, pt was instructed to continue albuterol neb solution. Rx for albuterol neb solution has been sent to preferred pharmacy. Nothing further is needed.

## 2018-07-02 DIAGNOSIS — J441 Chronic obstructive pulmonary disease with (acute) exacerbation: Secondary | ICD-10-CM | POA: Diagnosis not present

## 2018-07-15 ENCOUNTER — Ambulatory Visit: Payer: Self-pay | Admitting: Pulmonary Disease

## 2018-07-17 ENCOUNTER — Other Ambulatory Visit: Payer: Self-pay

## 2018-07-17 ENCOUNTER — Encounter: Payer: Self-pay | Admitting: Pulmonary Disease

## 2018-07-17 ENCOUNTER — Ambulatory Visit: Payer: Medicare HMO | Admitting: Pulmonary Disease

## 2018-07-17 DIAGNOSIS — J441 Chronic obstructive pulmonary disease with (acute) exacerbation: Secondary | ICD-10-CM | POA: Diagnosis not present

## 2018-07-17 MED ORDER — PREDNISONE 10 MG PO TABS: ORAL_TABLET | ORAL | 0 refills | Status: DC

## 2018-07-17 NOTE — Assessment & Plan Note (Signed)
Sudden onset & variability suggesting a component of asthma  Prednisone Rx to keep handy in case of a flare Prednisone 10 mg tabs Take 4 tabs  daily with food x 4 days, then 3 tabs daily x 4 days, then 2 tabs daily x 4 days, then 1 tab daily x4 days then stop. #40  Stay on [place]  Call us if you have a flare Discussed above plan, we will keep seeing her every 2 months No clear allergies so holding off on Singulair

## 2018-07-17 NOTE — Patient Instructions (Signed)
Prednisone Rx to keep handy in case of a flare Prednisone 10 mg tabs Take 4 tabs  daily with food x 4 days, then 3 tabs daily x 4 days, then 2 tabs daily x 4 days, then 1 tab daily x4 days then stop. #40  Stay on [place]  Call us if you have a flare

## 2018-07-17 NOTE — Progress Notes (Signed)
   Subjective:    Patient ID: Lori Butler, female    DOB: 1951-02-19, 67 y.o.   MRN: [id number]  HPI  45 yoex- smokerfor FU of moderate COPD She quit smoking 09/2017  Chief Complaint  Patient presents with  . Follow-up    2 month follow for COPD. States she still has been having some SOB since January. Semi-productive cough.     She had 2 hospitalizations 12/2017 for acute hypercarbic respiratory failure with change in mental status.  Each time she improved quickly and did not require oxygen on discharge   hospitalized for COPD exacerbation likely d/t viral bronchitis in December 2019. OV 05/28/18 >> pred, abx Last week pred dosepak Back to baseline now, today's a good day, symptoms are explosive and seem to come on pretty quick  Significant tests/ events reviewed  CT angio 09/2017 >>Emphysema andInspissated mucus in the LEFT LOWER lobe bronchi,mild fibrotic changes upper lobes  PFTs 10/2017-moderate airway obstruction, ratio 59, FEV1 51%, FVC 65%, no significant BD response, DLCO 57%  Past Medical History:  Diagnosis Date  . Acute respiratory failure with hypoxia ([place])   . Anxiety   . Aortic atherosclerosis ([place])   . Arthritis    "a little in my hands probably" (10/31/2017)  . COPD (chronic obstructive pulmonary disease) ([place])   . COPD exacerbation ([place])   . Depression   . History of kidney stones   . Suicide attempt ([place]) 02/21/2018   overdosed on Wellbutrin     Review of Systems neg for any significant sore throat, dysphagia, itching, sneezing, nasal congestion or excess/ purulent secretions, fever, chills, sweats, unintended wt loss, pleuritic or exertional cp, hempoptysis, orthopnea pnd or change in chronic leg swelling. Also denies presyncope, palpitations, heartburn, abdominal pain, nausea, vomiting, diarrhea or change in bowel or urinary habits, dysuria,hematuria, rash, arthralgias, visual complaints, headache, numbness weakness or ataxia.     Objective:    Physical Exam  Gen. Pleasant, well-nourished, in no distress ENT - no thrush, no pallor/icterus,no post nasal drip Neck: No JVD, no thyromegaly, no carotid bruits Lungs: no use of accessory muscles, no dullness to percussion,decreased  without rales or rhonchi  Cardiovascular: Rhythm regular, heart sounds  normal, no murmurs or gallops, no peripheral edema Musculoskeletal: No deformities, no cyanosis or clubbing         Assessment & Plan:

## 2018-07-18 ENCOUNTER — Other Ambulatory Visit: Payer: Self-pay

## 2018-07-18 DIAGNOSIS — J449 Chronic obstructive pulmonary disease, unspecified: Secondary | ICD-10-CM | POA: Diagnosis not present

## 2018-07-18 NOTE — Patient Outreach (Signed)
[facility]) Care Management  07/18/2018  Lori Butler 09/03/1950 [id number]    1st unsuccessful outreach to the patient for assessment.  No answer.  HIPAA compliant voicemail left with contact information.  Plan: RN Health Coach will send letter. [place] will make outreach attempt  to the patient within a month.  Lazaro Arms RN, BSN, [place] Direct Dial:  [phone]  Fax: [phone]

## 2018-08-12 ENCOUNTER — Telehealth: Payer: Self-pay | Admitting: Pulmonary Disease

## 2018-08-12 MED ORDER — DOXYCYCLINE HYCLATE 100 MG PO TABS: 100.0000 mg | ORAL_TABLET | Freq: Two times a day (BID) | ORAL | 0 refills | Status: DC

## 2018-08-12 MED ORDER — PREDNISONE 10 MG PO TABS: ORAL_TABLET | ORAL | 0 refills | Status: DC

## 2018-08-12 NOTE — Telephone Encounter (Signed)
  Primary Pulmonologist: Elsworth Soho Last office visit and with whom: 07/17/2018 with RA What do we see them for (pulmonary problems): COPD Last OV assessment/plan: Instructions  Return in about 2 months (around 09/16/2018) for BW.  Prednisone Rx to keep handy in case of a flare Prednisone 10 mg tabs Take 4 tabs  daily with food x 4 days, then 3 tabs daily x 4 days, then 2 tabs daily x 4 days, then 1 tab daily x4 days then stop. #40  Stay on [place]  Call us if you have a flare       Was appointment offered to patient (explain)?  Pt requesting meds to be sent in   Reason for call: Called and spoke with pt who stated she began wheezing yesterday and stated she has had to use her rescue inhaler twice as well as having to use her neb machine twice. Pt stated that she did have some relief after using it but stated she is becoming tighter in chest.  Pt also has had a mild cough when she first wakes up and is getting up some thick brown-green phlegm.  Pt denies any fever, denies any recent travel, and has not been around anyone that has been sick.  Pt requesting meds to be sent in to help with symptoms. Beth, please advise recs for pt. Thanks!

## 2018-08-12 NOTE — Telephone Encounter (Signed)
Treat for acute copd exacerbation. Sending in RX doxycycline and prednisone taper. If not improvement in 1 week or symptoms worsen call for E-visit or go to ED. Advise she avoid close contact with others and monitor temperature.   Coronavirus (COVID-19) Are you at risk?  Are you at risk for the Coronavirus (COVID-19)?  To be considered HIGH RISK for Coronavirus (COVID-19), you have to meet the following criteria:  . Traveled to [place], [place], [place], [place] or [place]; or in the [place] to [place], [place], [place], or [place]; and have fever, cough, and shortness of breath within the last 2 weeks of travel OR . Been in close contact with a person diagnosed with COVID-19 within the last 2 weeks and have fever, cough, and shortness of breath . IF YOU DO NOT MEET THESE CRITERIA, YOU ARE CONSIDERED LOW RISK FOR COVID-19.  What to do if you are HIGH RISK for COVID-19?  Marland Kitchen If you are having a medical emergency, call 911. . Seek medical care right away. Before you go to a doctor's office, urgent care or emergency department, call ahead and tell them about your recent travel, contact with someone diagnosed with COVID-19, and your symptoms. You should receive instructions from your physician's office regarding next steps of care.  . When you arrive at healthcare provider, tell the healthcare staff immediately you have returned from visiting [place], [place], [place], [place] or [place]; or traveled in the [place] to [place], [place], [place], or [place]; in the last two weeks or you have been in close contact with a person diagnosed with COVID-19 in the last 2 weeks.   . Tell the health care staff about your symptoms: fever, cough and shortness of breath. . After you have been seen by a medical provider, you will be either: o Tested for (COVID-19) and discharged home on quarantine except to seek medical care if symptoms worsen, and asked to  - Stay home and avoid contact with  others until you get your results (4-5 days)  - Avoid travel on public transportation if possible (such as bus, train, or airplane) or o Sent to the Emergency Department by EMS for evaluation, COVID-19 testing, and possible admission depending on your condition and test results.  What to do if you are LOW RISK for COVID-19?  Reduce your risk of any infection by using the same precautions used for avoiding the common cold or flu:  Marland Kitchen Wash your hands often with soap and warm water for at least 20 seconds.  If soap and water are not readily available, use an alcohol-based hand sanitizer with at least 60% alcohol.  . If coughing or sneezing, cover your mouth and nose by coughing or sneezing into the elbow areas of your shirt or coat, into a tissue or into your sleeve (not your hands). . Avoid shaking hands with others and consider head nods or verbal greetings only. . Avoid touching your eyes, nose, or mouth with unwashed hands.  . Avoid close contact with people who are sick. . Avoid places or events with large numbers of people in one location, like concerts or sporting events. . Carefully consider travel plans you have or are making. . If you are planning any travel outside or inside the [place], visit the CDC's Travelers' Health webpage for the latest health notices. . If you have some symptoms but not all symptoms, continue to monitor at home and seek medical attention if your symptoms worsen. Marland Kitchen  If you are having a medical emergency, call 911.   [place] / e-Visit: [email or web address]         [facility]: [facility]: [phone]                   [facility]: [phone]

## 2018-08-12 NOTE — Telephone Encounter (Signed)
Called spoke with patient and discussed Volanda Napoleon NP's recommendations as stated below.  Advised patient of pending Rx's and the directions for each.  Patient voiced her understanding and denied any questions/concerns.  Also discussed with patient ways to stay safe during this COVID crisis and symptoms to watch for.  Patient voiced her understanding and denied questions/concerns.  Patient is aware to call the office or seek emergency care if symptoms do not improve or they worsen.  Nothing further needed; will sign off.

## 2018-08-15 ENCOUNTER — Other Ambulatory Visit: Payer: Self-pay

## 2018-08-15 NOTE — Patient Outreach (Signed)
[facility]) Care Management  08/15/2018   Lori Butler 1951/01/08 [id number]  Subjective: Successful call to the patient.  HIPAA verified.  The patient states that she was having some shortness of breath and wheezing and spoke with her  physician.  She was called in Doxycycline and a prednisone taper.  She denies any wheezing but states that she still has some shortness of breath with exertion. She states that she is taking her medications as prescribed.  The patient has to go out and pick up her medications but she uses the drive thru.  She is wearing a mask.  She has her groceries delivered to her.  Discussed the signs and symptoms of covid-19 and precautionary methods.  Patient verbalized understanding.  The patient states that she is exercising in the home by walking more and outside to her mailbox.  She states that she has a virtual telephone call with her physician on May 11th for follow up.   Current Medications:  Current Outpatient Medications  Medication Sig Dispense Refill  . acetaminophen (TYLENOL) 500 MG tablet Take 1,000 mg by mouth every 6 (six) hours as needed for mild pain or headache.    . albuterol (PROVENTIL HFA;VENTOLIN HFA) 108 (90 Base) MCG/ACT inhaler Inhale 2 puffs into the lungs every 6 (six) hours as needed for wheezing or shortness of breath. 1 Inhaler 2  . albuterol (PROVENTIL) (2.5 MG/3ML) 0.083% nebulizer solution Take 3 mLs (2.5 mg total) by nebulization every 6 (six) hours as needed for wheezing or shortness of breath. 75 mL 6  . alendronate (FOSAMAX) 70 MG tablet Take 70 mg by mouth every Wednesday.     . budesonide-formoterol (SYMBICORT) 160-4.5 MCG/ACT inhaler Inhale 2 puffs into the lungs 2 (two) times daily. 1 Inhaler 0  . Cholecalciferol (VITAMIN D) 50 MCG (2000 UT) tablet Take 2,000 Units by mouth daily.    Marland Kitchen doxycycline (VIBRA-TABS) 100 MG tablet Take 1 tablet (100 mg total) by mouth 2 (two) times daily. 14 tablet 0  . guaiFENesin  (MUCINEX) 600 MG 12 hr tablet Take 600 mg by mouth 2 (two) times daily.    . montelukast (SINGULAIR) 10 MG tablet Take 1 tablet (10 mg total) by mouth at bedtime. 30 tablet 1  . pantoprazole (PROTONIX) 40 MG tablet Take 1 tablet (40 mg total) by mouth 2 (two) times daily. 60 tablet 1  . predniSONE (DELTASONE) 10 MG tablet Take 4 tabs po daily x 3 days; then 3 tabs daily x3 days; then 2 tabs daily x3 days; then 1 tab daily x 3 days; then stop 30 tablet 0  . sertraline (ZOLOFT) 50 MG tablet Take 1 tablet (50 mg total) by mouth daily. 30 tablet 0  . tiotropium (SPIRIVA HANDIHALER) 18 MCG inhalation capsule Place 1 capsule (18 mcg total) into inhaler and inhale daily. 30 capsule 11   No current facility-administered medications for this visit.     Functional Status:  In your present state of health, do you have any difficulty performing the following activities: 05/14/2018 05/06/2018  Hearing? N N  Vision? N N  Difficulty concentrating or making decisions? N N  Walking or climbing stairs? N N  Dressing or bathing? N N  Doing errands, shopping? N N  Preparing Food and eating ? - -  Using the Toilet? - -  In the past six months, have you accidently leaked urine? - -  Do you have problems with loss of bowel control? - -  Managing your Medications? - -  Managing your Finances? - -  Housekeeping or managing your Housekeeping? - -  Some encounter information is confidential and restricted. Go to Review Flowsheets activity to see all data.  Some recent data might be hidden    Fall/Depression Screening: Fall Risk  08/15/2018 05/14/2018 03/01/2018  Falls in the past year? 0 0 No   PHQ 2/9 Scores 05/14/2018 03/01/2018 01/22/2018  PHQ - 2 Score 1 1 1     Assessment: Patient will continue to benefit from health coach outreach for disease management and support. [facility] CM Care Plan Problem One     Most Recent Value  [facility] Long Term Goal   Patient will have no hospitalizations for COPD exacerbation in the  next 60 days.  [facility] Long Term Goal Start Date  08/15/18  Interventions for Problem One Long Term Goal  Discussed covid -19 precautionay measures, signs and symptoms of COPD, exercise and medication adherence     Plan: RN Health Coach will contact patient in the month of June and patient agrees to next outreach.    Lazaro Arms RN, BSN, [place] Direct Dial:  [phone]  Fax: [phone]

## 2018-08-18 DIAGNOSIS — J449 Chronic obstructive pulmonary disease, unspecified: Secondary | ICD-10-CM | POA: Diagnosis not present

## 2018-08-21 DIAGNOSIS — M25471 Effusion, right ankle: Secondary | ICD-10-CM | POA: Diagnosis not present

## 2018-08-27 ENCOUNTER — Telehealth: Payer: Self-pay | Admitting: Pulmonary Disease

## 2018-08-27 MED ORDER — PREDNISONE 10 MG PO TABS: ORAL_TABLET | ORAL | 0 refills | Status: DC

## 2018-08-27 NOTE — Telephone Encounter (Signed)
Yes I can sent in refill of prednisone. Make sure she is taking Spiriva, symbicort and singulair. Advise mucinex twice a day. Monitor for fever. If not better need Evist.

## 2018-08-27 NOTE — Telephone Encounter (Signed)
Primary Pulmonologist: RA Last office visit and with whom: 07/17/2018 with RA What do we see them for (pulmonary problems): COPD Last OV assessment/plan: Instructions  Return in about 2 months (around 09/16/2018) for BW.  Prednisone Rx to keep handy in case of a flare Prednisone 10 mg tabs Take 4 tabs  daily with food x 4 days, then 3 tabs daily x 4 days, then 2 tabs daily x 4 days, then 1 tab daily x4 days then stop. #40  Stay on [place]  Call us if you have a flare       Was appointment offered to patient (explain)?  Pt requesting prednisone Rx   Reason for call: Called and spoke with pt.  Pt is having difficulty breathing and is also having tightness in chest again which she stated began yesterday, 4/20. Pt is also coughing with thick white mucus in the morning and then after getting mucus up in the morning, cough is a dry cough.  Pt denies any fever, body aches or chills and pt has not travelled anywhere recently and has not been around anyone that has been sick.  Pt has been taking mucinex daily as prescribed, using her symbicort and spiriva as prescribed. Pt has also been taking montelukast as prescribed.  Pt stated she had to use her rescue inhaler once yesterday and has used her nebulizer once daily as well.   Pt is requesting a refill of prednisone. Beth, please advise on this for pt. Thanks!

## 2018-08-27 NOTE — Telephone Encounter (Signed)
Pt aware rx prednisone refilled. She is taking Spiriva, Symbicort. Mentioned adding Mucinex and scheduling e-visit if not improved. Nothing further needed.

## 2018-09-13 NOTE — Progress Notes (Signed)
Virtual Visit via Telephone Note  I connected with Lori Butler on 09/13/18 at  9:00 AM EDT by telephone and verified that I am speaking with the correct person using two identifiers.  Location: Patient: Home Provider: Office   I discussed the limitations, risks, security and privacy concerns of performing an evaluation and management service by telephone and the availability of in person appointments. I also discussed with the patient that there may be a patient responsible charge related to this service. The patient expressed understanding and agreed to proceed.   History of Present Illness: 68 year old female, former smoker (quit May 2019). PMH significant for COPD, hypoxia, anxiety/depression, attempted suicide, tobacco abuse. Patient of Dr. Elsworth Soho, last seen on 07/17/18. Treated twice over the phone for COPD exacerbation in April. Eos absolute in August 2019 700. Sudden onset and variability seem to suggest component of asthma. Maintained on Symbicort 160, Spiriva and Singulair.   09/16/2018 Patient called today for 2 month follow-up visit. Complains of shortness of breath over the past couple of days. Associated cough with thick white mucus, dark in the am and then clears. She does get relief from oral steroid course, feels better for a about a week or two. No fever, sick contacts or flu-like symptoms.   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 Elsworth Soho or sooner if needed   I discussed the assessment and treatment plan with the patient. The patient was provided an opportunity to ask questions and all were answered. The patient agreed with the plan and demonstrated an understanding of the instructions.   The patient was advised to call back or seek an in-person evaluation if the symptoms worsen or if the condition fails to improve as anticipated.  I provided 22 minutes of non-face-to-face time during this encounter.   Martyn Ehrich, NP

## 2018-09-16 ENCOUNTER — Other Ambulatory Visit: Payer: Self-pay | Admitting: Primary Care

## 2018-09-16 ENCOUNTER — Ambulatory Visit (INDEPENDENT_AMBULATORY_CARE_PROVIDER_SITE_OTHER): Payer: Medicare HMO | Admitting: Primary Care

## 2018-09-16 ENCOUNTER — Ambulatory Visit (INDEPENDENT_AMBULATORY_CARE_PROVIDER_SITE_OTHER): Payer: Medicare HMO

## 2018-09-16 ENCOUNTER — Other Ambulatory Visit: Payer: Self-pay

## 2018-09-16 ENCOUNTER — Ambulatory Visit: Payer: Self-pay | Admitting: Primary Care

## 2018-09-16 ENCOUNTER — Telehealth: Payer: Self-pay | Admitting: Primary Care

## 2018-09-16 DIAGNOSIS — J441 Chronic obstructive pulmonary disease with (acute) exacerbation: Secondary | ICD-10-CM

## 2018-09-16 DIAGNOSIS — F17210 Nicotine dependence, cigarettes, uncomplicated: Secondary | ICD-10-CM | POA: Diagnosis not present

## 2018-09-16 DIAGNOSIS — R0602 Shortness of breath: Secondary | ICD-10-CM

## 2018-09-16 DIAGNOSIS — R05 Cough: Secondary | ICD-10-CM | POA: Diagnosis not present

## 2018-09-16 DIAGNOSIS — J449 Chronic obstructive pulmonary disease, unspecified: Secondary | ICD-10-CM | POA: Diagnosis not present

## 2018-09-16 LAB — CBC WITH DIFFERENTIAL/PLATELET
Basophils Absolute: 0.2 10*3/uL — ABNORMAL HIGH (ref 0.0–0.1)
Basophils Relative: 1.5 % (ref 0.0–3.0)
Eosinophils Absolute: 0.5 10*3/uL (ref 0.0–0.7)
Eosinophils Relative: 4.3 % (ref 0.0–5.0)
HCT: 38.9 % (ref 36.0–46.0)
Hemoglobin: 12.8 g/dL (ref 12.0–15.0)
Lymphocytes Relative: 23.6 % (ref 12.0–46.0)
Lymphs Abs: 2.5 10*3/uL (ref 0.7–4.0)
MCHC: 32.8 g/dL (ref 30.0–36.0)
MCV: 90.3 fl (ref 78.0–100.0)
Monocytes Absolute: 0.7 10*3/uL (ref 0.1–1.0)
Monocytes Relative: 6.6 % (ref 3.0–12.0)
Neutro Abs: 6.9 10*3/uL (ref 1.4–7.7)
Neutrophils Relative %: 64 % (ref 43.0–77.0)
Platelets: 332 10*3/uL (ref 150.0–400.0)
RBC: 4.31 Mil/uL (ref 3.87–5.11)
RDW: 14 % (ref 11.5–15.5)
WBC: 10.7 10*3/uL — ABNORMAL HIGH (ref 4.0–10.5)

## 2018-09-16 MED ORDER — PREDNISONE 10 MG PO TABS: ORAL_TABLET | ORAL | 0 refills | Status: DC

## 2018-09-16 MED ORDER — AZITHROMYCIN 250 MG PO TABS: ORAL_TABLET | ORAL | 0 refills | Status: DC

## 2018-09-16 NOTE — Telephone Encounter (Signed)
Pt aware:CXR showed clear lungs, will call after labs resulted Nothing further needed.

## 2018-09-16 NOTE — Patient Instructions (Addendum)
Treating you for COPD exacerbation; possible asthma overlap   Use Albuterol nebulizer 2-3 times a day for shortness of breath   RX: zpack Prednisone taper  Orders: CXR Labs (cbc with diff, IgE)  Follow-up: 2-3 months with Dr. Elsworth Soho

## 2018-09-16 NOTE — Addendum Note (Signed)
Addended by: Suzzanne Cloud E on: 09/16/2018 09:54 AM   Modules accepted: Orders

## 2018-09-16 NOTE — Addendum Note (Signed)
Addended by: Karmen Stabs on: 09/16/2018 09:54 AM   Modules accepted: Orders

## 2018-09-17 DIAGNOSIS — J449 Chronic obstructive pulmonary disease, unspecified: Secondary | ICD-10-CM | POA: Diagnosis not present

## 2018-09-17 LAB — IGE: IgE (Immunoglobulin E), Serum: 35 kU/L (ref <=114)

## 2018-09-25 ENCOUNTER — Telehealth: Payer: Self-pay | Admitting: Primary Care

## 2018-09-25 ENCOUNTER — Other Ambulatory Visit: Payer: Self-pay | Admitting: Acute Care

## 2018-09-25 DIAGNOSIS — Z87891 Personal history of nicotine dependence: Secondary | ICD-10-CM

## 2018-09-25 DIAGNOSIS — Z122 Encounter for screening for malignant neoplasm of respiratory organs: Secondary | ICD-10-CM

## 2018-09-25 MED ORDER — PREDNISONE 10 MG PO TABS: 10.0000 mg | ORAL_TABLET | Freq: Every day | ORAL | 1 refills | Status: DC

## 2018-09-25 NOTE — Telephone Encounter (Signed)
Please have patient stay on prednisone 10mg  daily(RX sent). Follow up in 6 weeks to re-assess.

## 2018-09-25 NOTE — Telephone Encounter (Signed)
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 She is requiring frequent use of oral steroid for shortness of breath and chest tightness. Already on Symbicort 160, Spiriva and Singulair. Labs showed Eos absolute 0.5 and IgE 35. Would you recommend trying daliresp to decrease frequency of exacerbations or would you consider a biologic for her?   Wanted your input. Thanks, [employer]

## 2018-09-26 NOTE — Telephone Encounter (Signed)
Spoke with pt and relayed information.  6 week f/u appt made on 07/02 with BW.  Nothing further is needed.

## 2018-10-14 ENCOUNTER — Ambulatory Visit (INDEPENDENT_AMBULATORY_CARE_PROVIDER_SITE_OTHER): Payer: Medicare HMO | Admitting: Acute Care

## 2018-10-14 ENCOUNTER — Other Ambulatory Visit: Payer: Self-pay

## 2018-10-14 ENCOUNTER — Telehealth: Payer: Self-pay | Admitting: Acute Care

## 2018-10-14 ENCOUNTER — Encounter: Payer: Self-pay | Admitting: Acute Care

## 2018-10-14 ENCOUNTER — Ambulatory Visit (INDEPENDENT_AMBULATORY_CARE_PROVIDER_SITE_OTHER)
Admission: RE | Admit: 2018-10-14 | Discharge: 2018-10-14 | Disposition: A | Discharge status: Home / Self Care | Payer: Medicare HMO | Source: Ambulatory Visit | Attending: Acute Care | Admitting: Acute Care

## 2018-10-14 VITALS — BP 130/76 | HR 94 | Temp 97.9°F | Ht 65.0 in | Wt 177.2 lb

## 2018-10-14 DIAGNOSIS — Z122 Encounter for screening for malignant neoplasm of respiratory organs: Secondary | ICD-10-CM

## 2018-10-14 DIAGNOSIS — Z87891 Personal history of nicotine dependence: Secondary | ICD-10-CM

## 2018-10-14 NOTE — Telephone Encounter (Signed)
I will call the patient with the results

## 2018-10-14 NOTE — Telephone Encounter (Signed)
Received call from Opal Sidles at [facility]. She wanted to make sure the provider is aware of CT report,   "IMPRESSION: 1. Lung-RADS 4X, highly suspicious. Consultation with pulmonary medicine or thoracic surgery is recommended, as well as staging evaluation with PET imaging, as clinically indicated."  Routing to Eric Form, NP.

## 2018-10-14 NOTE — Progress Notes (Addendum)
Shared Decision Making Visit Lung Cancer Screening Program [phone])   Eligibility:  Age 68 y.o.  Pack Years Smoking History Calculation: 74 pack year smoking history (# packs/per year x # years smoked)  Recent History of coughing up blood  no  Unexplained weight loss? no ( >Than 15 pounds within the last 6 months )  Prior History Lung / other cancer no (Diagnosis within the last 5 years already requiring surveillance chest CT Scans).  Smoking Status Former Smoker  Former Smokers: Years since quit: 1 year  Quit Date: 5/.2019  Visit Components:  Discussion included one or more decision making aids. yes  Discussion included risk/benefits of screening. yes  Discussion included potential follow up diagnostic testing for abnormal scans. yes  Discussion included meaning and risk of over diagnosis. yes  Discussion included meaning and risk of False Positives. yes  Discussion included meaning of total radiation exposure. yes  Counseling Included:  Importance of adherence to annual lung cancer LDCT screening. yes  Impact of comorbidities on ability to participate in the program. yes  Ability and willingness to under diagnostic treatment. yes  Smoking Cessation Counseling:  Current Smokers:   Discussed importance of smoking cessation. No, Former smoker  Information about tobacco cessation classes and interventions provided to patient. yes  Patient provided with "ticket" for LDCT Scan. yes  Symptomatic Patient. no  Counseling  Diagnosis Code: Tobacco Use Z72.0  Asymptomatic Patient yes  Counseling (Intermediate counseling: > three minutes counseling) N6295  Former Smokers:   Discussed the importance of maintaining cigarette abstinence. yes  Diagnosis Code: Personal History of Nicotine Dependence. M84.132  Information about tobacco cessation classes and interventions provided to patient. Yes  Patient provided with "ticket" for LDCT Scan. yes  Written Order  for Lung Cancer Screening with LDCT placed in Epic. Yes (CT Chest Lung Cancer Screening Low Dose W/O CM) [id number] Z12.2-Screening of respiratory organs Z87.891-Personal history of nicotine dependence  BP 130/76 (BP Location: Left Arm, Cuff Size: Normal)   Pulse 94   Temp 97.9 F (36.6 C)   Ht 5\' 5"  (1.651 m)   Wt 177 lb 3.2 oz (80.4 kg)   SpO2 93%   BMI 29.49 kg/m    I spent 25 minutes of face to face time with Lori Butler discussing the risks and benefits of lung cancer screening. We viewed a power point together that explained in detail the above noted topics. We took the time to pause the power point at intervals to allow for questions to be asked and answered to ensure understanding. We discussed that she had taken the single most powerful action possible to decrease her risk of developing lung cancer when she quit smoking. I counseled her to remain smoke free, and to contact me if she ever had the desire to smoke again so that I can provide resources and tools to help support the effort to remain smoke free. We discussed the time and location of the scan, and that either  Lori Glassman RN or I will call with the results within  24-48 hours of receiving them. She has my card and contact information in the event she needs to speak with me, in addition to a copy of the power point we reviewed as a resource. She verbalized understanding of all of the above and had no further questions upon leaving the office.     I explained to the patient that there has been a high incidence of coronary artery disease noted on these exams. I  explained that this is a non-gated exam therefore degree or severity cannot be determined. This patient is  Not on statin therapy. I have asked the patient to follow-up with their PCP regarding any incidental finding of coronary artery disease and management with diet or medication as they feel is clinically indicated. The patient verbalized understanding of the above and  had no further questions.   Pt. Did have some questions about some leg swelling. 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 As we did not want her to be late to her CT scan,  I have had her added for a video visit this week with Lori Barrow, NP to evaluate weaning her prednisone to the lowest effective dose, and discussions about other options for her asthma treatment. She verbalized  agreement with this plan.  Magdalen Spatz, NP 10/14/2018 9:42 AM

## 2018-10-15 ENCOUNTER — Telehealth: Payer: Self-pay | Admitting: Acute Care

## 2018-10-15 DIAGNOSIS — R911 Solitary pulmonary nodule: Secondary | ICD-10-CM

## 2018-10-15 NOTE — Telephone Encounter (Signed)
I have called the patient with the results of her low dose CT. I explained that she has a nodule in  Her left upper lobe that needs further evaluation with PET scanning and PFT's. I explained that we do not have a diagnosis, but are taking the first steps in determining what this nodule is. She verbalized understanding. I explained that someone will call her to schedule her PET and PFT's. She has the office contact information in the event she has any further questions.  Denise please fax copy to PCP and let them know we are scheduling a PET and PFT's. Thanks so much  Lung-RADS 4X, highly suspicious. Consultation with pulmonary medicine or thoracic surgery is recommended, as well as staging evaluation with PET imaging, as clinically indicated. These results will be called to the ordering clinician or representative by the Radiologist Assistant, and communication documented in the PACS or zVision Dashboard. 2. Aortic atherosclerosis (ICD10-170.0). Coronary artery calcification. 3.  Emphysema (ICD10-J43.9).

## 2018-10-16 ENCOUNTER — Other Ambulatory Visit (HOSPITAL_COMMUNITY): Payer: Medicare HMO

## 2018-10-16 ENCOUNTER — Telehealth: Payer: Self-pay | Admitting: Primary Care

## 2018-10-16 ENCOUNTER — Other Ambulatory Visit (HOSPITAL_COMMUNITY)
Admission: RE | Admit: 2018-10-16 | Discharge: 2018-10-16 | Disposition: A | Discharge status: Home / Self Care | Payer: Medicare HMO | Source: Ambulatory Visit | Attending: Acute Care | Admitting: Acute Care

## 2018-10-16 ENCOUNTER — Other Ambulatory Visit: Payer: Self-pay | Admitting: Acute Care

## 2018-10-16 ENCOUNTER — Other Ambulatory Visit: Payer: Self-pay

## 2018-10-16 DIAGNOSIS — Z1159 Encounter for screening for other viral diseases: Secondary | ICD-10-CM | POA: Insufficient documentation

## 2018-10-16 DIAGNOSIS — Z01812 Encounter for preprocedural laboratory examination: Secondary | ICD-10-CM | POA: Insufficient documentation

## 2018-10-16 NOTE — Patient Outreach (Signed)
[facility]) Care Management  10/16/2018  Lori Butler [birth year] [phone]    Successful outreach to the patient.  Two patient identifiers obtained.  The patient stated "now was not a good time to talk."  She has a nodule in her lungs.  She states is going to have a test performed to see if it is cancerous. She had someone waiting to take her to her appointment.  Plan:  RN Health coach will outreach the patient with in the next thirty days.  Lazaro Arms RN, BSN, [place] Direct Dial:  [phone]  Fax: [phone]

## 2018-10-16 NOTE — Telephone Encounter (Signed)
Spoke to patient this morning and scheduled patient for COVID testing for 12:15 today.  Called patient, patient stated she didn't have a ride and needed to reschedule the COVID test today. Patient was rescheduled for later this afternoon.  Nothing further needed.

## 2018-10-17 LAB — NOVEL CORONAVIRUS, NAA (HOSP ORDER, SEND-OUT TO REF LAB; TAT 18-24 HRS): SARS-CoV-2, NAA: NOT DETECTED

## 2018-10-17 NOTE — Telephone Encounter (Signed)
Results faxed to PCP with notation that PFT's and PET scan has been ordered.

## 2018-10-18 ENCOUNTER — Encounter (HOSPITAL_COMMUNITY)
Admission: RE | Admit: 2018-10-18 | Discharge: 2018-10-18 | Disposition: A | Discharge status: Home / Self Care | Payer: Medicare HMO | Source: Ambulatory Visit | Attending: Acute Care | Admitting: Acute Care

## 2018-10-18 ENCOUNTER — Ambulatory Visit (HOSPITAL_COMMUNITY)
Admission: RE | Admit: 2018-10-18 | Discharge: 2018-10-18 | Disposition: A | Discharge status: Home / Self Care | Payer: Medicare HMO | Source: Ambulatory Visit | Attending: Acute Care | Admitting: Acute Care

## 2018-10-18 ENCOUNTER — Other Ambulatory Visit: Payer: Self-pay

## 2018-10-18 DIAGNOSIS — R911 Solitary pulmonary nodule: Secondary | ICD-10-CM

## 2018-10-18 DIAGNOSIS — J439 Emphysema, unspecified: Secondary | ICD-10-CM | POA: Insufficient documentation

## 2018-10-18 DIAGNOSIS — I2510 Atherosclerotic heart disease of native coronary artery without angina pectoris: Secondary | ICD-10-CM | POA: Insufficient documentation

## 2018-10-18 DIAGNOSIS — I700 Atherosclerosis of aorta: Secondary | ICD-10-CM | POA: Diagnosis not present

## 2018-10-18 LAB — PULMONARY FUNCTION TEST
DL/VA % pred: 78 %
DL/VA: 3.23 ml/min/mmHg/L
DLCO unc % pred: 52 %
DLCO unc: 10.69 ml/min/mmHg
FEF 25-75 Post: 0.73 L/sec
FEF 25-75 Pre: 0.38 L/sec
FEF2575-%Change-Post: 92 %
FEF2575-%Pred-Post: 36 %
FEF2575-%Pred-Pre: 18 %
FEV1-%Change-Post: 25 %
FEV1-%Pred-Post: 47 %
FEV1-%Pred-Pre: 37 %
FEV1-Post: 1.13 L
FEV1-Pre: 0.9 L
FEV1FVC-%Change-Post: 5 %
FEV1FVC-%Pred-Pre: 69 %
FEV6-%Change-Post: 17 %
FEV6-%Pred-Post: 64 %
FEV6-%Pred-Pre: 55 %
FEV6-Post: 1.96 L
FEV6-Pre: 1.66 L
FEV6FVC-%Change-Post: 0 %
FEV6FVC-%Pred-Post: 101 %
FEV6FVC-%Pred-Pre: 101 %
FVC-%Change-Post: 18 %
FVC-%Pred-Post: 64 %
FVC-%Pred-Pre: 54 %
FVC-Post: 2.03 L
FVC-Pre: 1.71 L
Post FEV1/FVC ratio: 56 %
Post FEV6/FVC ratio: 97 %
Pre FEV1/FVC ratio: 53 %
Pre FEV6/FVC Ratio: 97 %
RV % pred: 148 %
RV: 3.3 L
TLC % pred: 100 %
TLC: 5.23 L

## 2018-10-18 LAB — GLUCOSE, CAPILLARY: Glucose-Capillary: 70 mg/dL (ref 70–99)

## 2018-10-18 MED ORDER — ALBUTEROL SULFATE (2.5 MG/3ML) 0.083% IN NEBU
2.5000 mg | INHALATION_SOLUTION | Freq: Once | RESPIRATORY_TRACT | Status: AC
  Administered 2018-10-18: 2.5 mg via RESPIRATORY_TRACT

## 2018-10-18 MED ORDER — FLUDEOXYGLUCOSE F - 18 (FDG) INJECTION
8.8100 | Freq: Once | INTRAVENOUS | Status: AC | PRN
  Administered 2018-10-18: 14:00:00 8.81 via INTRAVENOUS

## 2018-10-22 ENCOUNTER — Telehealth (INDEPENDENT_AMBULATORY_CARE_PROVIDER_SITE_OTHER): Payer: Medicare HMO | Admitting: Pulmonary Disease

## 2018-10-22 DIAGNOSIS — J42 Unspecified chronic bronchitis: Secondary | ICD-10-CM

## 2018-10-22 DIAGNOSIS — R911 Solitary pulmonary nodule: Secondary | ICD-10-CM

## 2018-10-22 HISTORY — DX: Solitary pulmonary nodule: R91.1

## 2018-10-22 NOTE — Assessment & Plan Note (Signed)
Continue Symbicort and Spiriva Continue 10 mg of prednisone for now-this can be tapered on her follow-up visit 7/2 to 5 mg for another 4 weeks and then stopped I have asked her to decrease albuterol neb usage to as needed only

## 2018-10-22 NOTE — Patient Instructions (Signed)
Refer to CT surgery for second opinion-lung function is borderline but resection would be curative

## 2018-10-22 NOTE — Assessment & Plan Note (Signed)
We discussed high probability of this being malignancy given hypermetabolism on PET scan We discussed options including surgical resection versus biopsy followed by radiation therapy versus wait and watch approach.  We discussed risks and benefits of each approach Her lung function is borderline-she would probably not tolerate a lobectomy but could perhaps tolerate wedge resection.  She is willing to explore this idea with thoracic surgery consultation

## 2018-10-22 NOTE — Progress Notes (Signed)
I connected with  Lori Butler on 10/22/18 by phone and verified that I am speaking with the correct person using two identifiers.   I discussed the limitations of evaluation and management by telemedicine. The patient expressed understanding and agreed to proceed.    67 yoex-smokerfor FU of moderate COPD/asthma Sudden onset & variability suggesting a component of asthma She quit smoking 09/2017  Significant tests/ events reviewed  CT angio 09/2017 >>Emphysema andInspissated mucus in the LEFT LOWER lobe bronchi,mild fibrotic changes upper lobes  PFTs 10/2017-moderate airway obstruction, ratio 59, FEV1 51%, FVC 65%, no significantBDresponse, DLCO 57%  She has been maintained on 10 mg of prednisone since she had a phone visit 09/16/2018 for COPD exacerbation.  She has done well since then on a breathing standpoint.  She is maintained on Spiriva and Symbicort with which she is compliant.  She takes albuterol nebs 2-3 times a day when she remembers to take this. She was referred to lung cancer screening and underwent CT chest 10/14/2018 which I reviewed with her in detail-she had bilateral upper lobe parenchymal scarring on her prior CT and left upper lobe scar that appeared more nodular on the CT measuring about 13 mm  PET scan showed hypermetabolism with SUV 8 in this nodule She underwent PFTs which showed moderate reversible airway obstruction with FEV1 of 37%-0.91 which improved to 47%-1.1L DLCO was about 54%  Meds were reviewed in detail

## 2018-10-23 ENCOUNTER — Telehealth: Payer: Self-pay | Admitting: Pulmonary Disease

## 2018-10-23 NOTE — Telephone Encounter (Signed)
Returned call to patient and made aware she will be contacted directly from Dr. Leonarda Salon office. She can then let them know her preference. Pt verbalized understanding. Nothing further needed.

## 2018-10-28 ENCOUNTER — Other Ambulatory Visit: Payer: Self-pay

## 2018-10-29 ENCOUNTER — Institutional Professional Consult (permissible substitution) (INDEPENDENT_AMBULATORY_CARE_PROVIDER_SITE_OTHER): Payer: Medicare HMO | Admitting: Thoracic Surgery (Cardiothoracic Vascular Surgery)

## 2018-10-29 ENCOUNTER — Encounter: Payer: Self-pay | Admitting: Thoracic Surgery (Cardiothoracic Vascular Surgery)

## 2018-10-29 ENCOUNTER — Encounter: Payer: Self-pay | Admitting: *Deleted

## 2018-10-29 ENCOUNTER — Other Ambulatory Visit: Payer: Self-pay | Admitting: *Deleted

## 2018-10-29 VITALS — BP 135/76 | HR 100 | Temp 97.7°F | Resp 18 | Ht 65.0 in | Wt 177.0 lb

## 2018-10-29 DIAGNOSIS — R911 Solitary pulmonary nodule: Secondary | ICD-10-CM

## 2018-10-29 NOTE — H&P (View-Only) (Signed)
PCP is [name], MD Referring Provider is Rigoberto Noel, MD  Chief Complaint  Patient presents with  . Lung Lesion    Surgical eval, Chest CT lung screening 10/14/18, PET Scan 10/18/18, PFT's 10/18/18    HPI: Mrs. Mcguinness is sent for consultation regarding a left upper lobe lung nodule.  Shantasia Hunnell is a 68 year old woman with a 74-pack-year history of tobacco abuse, COPD, depression, anxiety, arthritis, and aortic atherosclerosis.  She had a low-dose screening CT on 10/14/2018 which showed bilateral apical parenchymal scarring and a 13 mm left upper lobe nodule.  Dr. Elsworth Soho did a PET CT which showed the nodule was hypermetabolic with an SUV of 8.  She has been feeling reasonably well.  She says that she can get around for the most part but does get short of breath with exertion.  She can walk up a flight of stairs.  She does have a cough which is occasionally productive of clear sputum.  She does have wheezing and has been using her albuterol inhaler more frequently.  She has been on prednisone since May for a COPD exacerbation.  She is gained about 10 pounds over the past 3 months.  She is not having any chest pain, pressure, or tightness.  Zubrod Score: At the time of surgery this patient's most appropriate activity status/level should be described as: []     0    Normal activity, no symptoms []     1    Restricted in physical strenuous activity but ambulatory, able to do out light work [x]     2    Ambulatory and capable of self care, unable to do work activities, up and about >50 % of waking hours                              []     3    Only limited self care, in bed greater than 50% of waking hours []     4    Completely disabled, no self care, confined to bed or chair []     5    Moribund  Past Medical History:  Diagnosis Date  . Acute respiratory failure with hypoxia ([place])   . Anxiety   . Aortic atherosclerosis ([place])   . Arthritis    "a little in my hands probably" (10/31/2017)  .  COPD (chronic obstructive pulmonary disease) ([place])   . COPD exacerbation ([place])   . Depression   . History of kidney stones   . Suicide attempt ([place]) 02/21/2018   overdosed on Wellbutrin    Past Surgical History:  Procedure Laterality Date  . ABDOMINAL HYSTERECTOMY    . APPENDECTOMY    . BREAST BIOPSY Left 1990s  . CATARACT EXTRACTION W/ INTRAOCULAR LENS  IMPLANT, BILATERAL Bilateral   . DILATION AND CURETTAGE OF UTERUS    . TONSILLECTOMY    . TUBAL LIGATION      Family History  Problem Relation Age of Onset  . Breast cancer Mother     Social History Social History   Tobacco Use  . Smoking status: Former Smoker    Packs/day: 1.50    Years: 51.00    Pack years: 76.50    Types: Cigarettes    Quit date: 09/05/2017    Years since quitting: 1.1  . Smokeless tobacco: Never Used  Substance Use Topics  . Alcohol use: Not Currently    Comment: 02/21/2018 "couple drinks/year"  . Drug use:  Never    Current Outpatient Medications  Medication Sig Dispense Refill  . acetaminophen (TYLENOL) 500 MG tablet Take 1,000 mg by mouth every 6 (six) hours as needed for mild pain or headache.    . albuterol (PROVENTIL HFA;VENTOLIN HFA) 108 (90 Base) MCG/ACT inhaler Inhale 2 puffs into the lungs every 6 (six) hours as needed for wheezing or shortness of breath. 1 Inhaler 2  . albuterol (PROVENTIL) (2.5 MG/3ML) 0.083% nebulizer solution Take 3 mLs (2.5 mg total) by nebulization every 6 (six) hours as needed for wheezing or shortness of breath. 75 mL 6  . alendronate (FOSAMAX) 70 MG tablet Take 70 mg by mouth every Wednesday.     . budesonide-formoterol (SYMBICORT) 160-4.5 MCG/ACT inhaler Inhale 2 puffs into the lungs 2 (two) times daily. 1 Inhaler 0  . Cholecalciferol (VITAMIN D) 50 MCG (2000 UT) tablet Take 2,000 Units by mouth daily.    Marland Kitchen guaiFENesin (MUCINEX) 600 MG 12 hr tablet Take 600 mg by mouth 2 (two) times daily.    . montelukast (SINGULAIR) 10 MG tablet Take 1 tablet (10 mg total) by  mouth at bedtime. 30 tablet 1  . pantoprazole (PROTONIX) 40 MG tablet Take 1 tablet (40 mg total) by mouth 2 (two) times daily. 60 tablet 1  . predniSONE (DELTASONE) 10 MG tablet Take 1 tablet (10 mg total) by mouth daily with breakfast. 30 tablet 1  . sertraline (ZOLOFT) 50 MG tablet Take 1 tablet (50 mg total) by mouth daily. 30 tablet 0  . tiotropium (SPIRIVA HANDIHALER) 18 MCG inhalation capsule Place 1 capsule (18 mcg total) into inhaler and inhale daily. 30 capsule 11   No current facility-administered medications for this visit.     Allergies  Allergen Reactions  . Demerol [Meperidine] Other (See Comments)    "horrible reaction"  . Oxycodone-Acetaminophen Other (See Comments)    Upset stomach   . Tramadol Nausea And Vomiting    Review of Systems  Constitutional: Positive for appetite change (Increased on steroids) and unexpected weight change (Has gained 10 pounds on prednisone). Negative for activity change, chills and fever.  HENT: Negative for trouble swallowing and voice change.   Eyes: Negative for visual disturbance.  Respiratory: Positive for cough, shortness of breath and wheezing.   Cardiovascular: Positive for leg swelling. Negative for chest pain and palpitations.  Gastrointestinal: Positive for abdominal pain (Reflux).  Genitourinary: Negative for difficulty urinating and dysuria.  Musculoskeletal: Positive for arthralgias and joint swelling.  Neurological: Negative for seizures and weakness.  Hematological: Negative for adenopathy. Does not bruise/bleed easily.  Psychiatric/Behavioral: Positive for dysphoric mood. The patient is nervous/anxious.   All other systems reviewed and are negative.   BP 135/76 (BP Location: Right Arm, Patient Position: Sitting, Cuff Size: Large)   Pulse 100   Temp 97.7 F (36.5 C) (Skin)   Resp 18   Ht 5\' 5"  (1.651 m)   Wt 177 lb (80.3 kg)   SpO2 93% Comment: RA  BMI 29.45 kg/m  Physical Exam Vitals signs reviewed.   Constitutional:      General: She is not in acute distress.    Appearance: She is obese.  HENT:     Head: Normocephalic and atraumatic.  Eyes:     General: No scleral icterus.    Extraocular Movements: Extraocular movements intact.  Neck:     Musculoskeletal: Neck supple.  Cardiovascular:     Rate and Rhythm: Normal rate and regular rhythm.     Heart sounds: Normal heart sounds.  No murmur. No friction rub. No gallop.   Pulmonary:     Effort: Pulmonary effort is normal. No respiratory distress.     Breath sounds: No wheezing.     Comments: Diminished breath sounds bilaterally Abdominal:     General: There is no distension.     Palpations: Abdomen is soft.     Tenderness: There is no abdominal tenderness.  Musculoskeletal:        General: No swelling.  Lymphadenopathy:     Cervical: No cervical adenopathy.  Skin:    General: Skin is warm and dry.  Neurological:     General: No focal deficit present.     Mental Status: She is alert and oriented to person, place, and time.     Cranial Nerves: No cranial nerve deficit.     Motor: No weakness.  Psychiatric:        Mood and Affect: Mood normal.    Diagnostic Tests: CT CHEST WITHOUT CONTRAST LOW-DOSE FOR LUNG CANCER SCREENING  TECHNIQUE: Multidetector CT imaging of the chest was performed following the standard protocol without IV contrast.  COMPARISON:  12/09/2017 and 09/15/2017.  FINDINGS: Cardiovascular: Atherosclerotic calcification of the aorta and coronary arteries. Heart size normal. Small amount of pericardial fluid is likely physiologic.  Mediastinum/Nodes: No pathologically enlarged mediastinal or axillary lymph nodes. Hilar regions are difficult to evaluate without IV contrast but appear grossly unremarkable. Esophagus is grossly unremarkable.  Lungs/Pleura: Biapical pleuroparenchymal scarring. Spiculated nodule in the apical left upper lobe measures 12.6 mm (series 3, image 71). 3.6 mm left lower  lobe nodule is nonspecific. No pleural fluid. Airway is unremarkable.  Upper Abdomen: Vague low-attenuation lesion in the liver measures approximately 1.3 cm, as before. Visualized portions of the adrenal glands, kidneys, spleen, pancreas, stomach and bowel are unremarkable. Small hiatal hernia. No upper abdominal adenopathy.  Musculoskeletal: No worrisome lytic or sclerotic lesions.  IMPRESSION: 1. Lung-RADS 4X, highly suspicious. Consultation with pulmonary medicine or thoracic surgery is recommended, as well as staging evaluation with PET imaging, as clinically indicated. These results will be called to the ordering clinician or representative by the Radiologist Assistant, and communication documented in the PACS or zVision Dashboard. 2. Aortic atherosclerosis (ICD10-170.0). Coronary artery calcification. 3.  Emphysema (ICD10-J43.9).   Electronically Signed   By: Lorin Picket M.D.   On: 10/14/2018 12:42 NUCLEAR MEDICINE PET SKULL BASE TO THIGH  TECHNIQUE: 8.8 mCi F-18 FDG was injected intravenously. Full-ring PET imaging was performed from the skull base to thigh after the radiotracer. CT data was obtained and used for attenuation correction and anatomic localization.  Fasting blood glucose: 70 mg/dl  COMPARISON:  Chest CT 10/14/2018.  FINDINGS: Mediastinal blood pool activity: SUV max 2.8  Liver activity: SUV max NA  NECK: Extensive muscular activity throughout the neck and upper chest. Given this limitation, No abnormal cervical hypermetabolism.  Incidental CT findings: No cervical adenopathy.  CHEST: Hypermetabolism corresponding to the left upper lobe spiculated pulmonary nodule. Example at 1.3 cm and a S.U.V. max of 8.4 on image 18/8.  No thoracic nodal hypermetabolism.  Incidental CT findings: Deferred to recent diagnostic CT. Aortic and coronary artery atherosclerosis. Centrilobular emphysema with biapical pleuroparenchymal  scarring.  ABDOMEN/PELVIS: No abdominopelvic parenchymal or nodal hypermetabolism.  Incidental CT findings: Normal adrenal glands. Abdominal aortic atherosclerosis. Segment 4a low-density liver lesion is likely a cyst at 1.1 cm. Hysterectomy.  SKELETON: No abnormal marrow activity.  Incidental CT findings: none  IMPRESSION: 1. Left upper lobe hypermetabolic spiculated pulmonary nodule, consistent with  primary bronchogenic carcinoma. No hypermetabolic thoracic nodal or extrathoracic metastasis identified. Presuming non-small-cell histology, T1bN0M0 or stage IA. 2. Extensive muscular activity throughout the neck and chest, mildly limiting evaluation. 3. Aortic atherosclerosis (ICD10-I70.0), coronary artery atherosclerosis and emphysema (ICD10-J43.9).   Electronically Signed   By: Abigail Miyamoto M.D.   On: 10/18/2018 16:25 I personally reviewed the CT and PET/CT images and concur with the findings noted above  Impression: Junnie Loschiavo is a 68 year old woman with a history of tobacco abuse, COPD, aortic atherosclerosis, arthritis, depression, and anxiety.  She had a low-dose screening CT earlier this month which showed a 1.3 cm left upper lobe nodule and biapical scarring.  On PET CT the left upper lobe nodule is markedly hypermetabolic with an SUV of 8.  I reviewed the images with Mrs. Dietz and her son.  We discussed the differential diagnosis.  This almost certainly is a non-small cell carcinoma.  Infectious or inflammatory nodules are in the differential but are far less likely.  We need to consider this to be a non-small cell carcinoma unless we can prove otherwise.  We discussed potential treatment options including surgery and radiation.  This is not amenable to a wedge resection but she would be a candidate for a segmentectomy.  I think that would have some negative impact on her respiratory capacity, but I do not think it would result in unacceptable quality of life.  She  understands the relative advantages and disadvantages of surgery and radiation.    I did describe the general nature of the VATS and discussed the indications, risks, benefits, and alternatives with Mrs. Vallandingham and her son.  We also briefly discussed stereotactic radiation.  She does not want undergo surgery and is strongly interested in radiation therapy.  We discussed navigational bronchoscopy to establish a diagnosis.  I informed her of the general nature of that procedure including the need for general anesthesia.  She is aware that it is endoscopic.  We will plan to do it on an outpatient basis.  We discussed the indications, risk, benefits, and alternatives.  She understands the risk include failure to make a diagnosis, pneumothorax, bleeding, as well as risks associated with general anesthesia such as MI and DVT and PE.  She accepts those risks and wishes to proceed.  COPD-currently reasonably well controlled on Spiriva, Singulair, and prednisone.  Plan: Electromagnetic navigational bronchoscopy for biopsy and fiducial placement on Monday, 11/11/2018 We will arrange her to be seen in our multidisciplinary thoracic oncology clinic by radiation oncology on Thursday, 11/14/2018  Melrose Nakayama, MD [facility] [phone]

## 2018-10-29 NOTE — Progress Notes (Signed)
PCP is [name], MD Referring Provider is Rigoberto Noel, MD  Chief Complaint  Patient presents with  . Lung Lesion    Surgical eval, Chest CT lung screening 10/14/18, PET Scan 10/18/18, PFT's 10/18/18    HPI: Lori Butler is sent for consultation regarding a left upper lobe lung nodule.  Lori Butler is a 68 year old woman with a 74-pack-year history of tobacco abuse, COPD, depression, anxiety, arthritis, and aortic atherosclerosis.  Lori Butler had a low-dose screening CT on 10/14/2018 which showed bilateral apical parenchymal scarring and a 13 mm left upper lobe nodule.  Dr. Elsworth Soho did a PET CT which showed the nodule was hypermetabolic with an SUV of 8.  Lori Butler has been feeling reasonably well.  Lori Butler says that Lori Butler can get around for the most part but does get short of breath with exertion.  Lori Butler can walk up a flight of stairs.  Lori Butler does have a cough which is occasionally productive of clear sputum.  Lori Butler does have wheezing and has been using her albuterol inhaler more frequently.  Lori Butler has been on prednisone since May for a COPD exacerbation.  Lori Butler is gained about 10 pounds over the past 3 months.  Lori Butler is not having any chest pain, pressure, or tightness.  Zubrod Score: At the time of surgery this patient's most appropriate activity status/level should be described as: []     0    Normal activity, no symptoms []     1    Restricted in physical strenuous activity but ambulatory, able to do out light work [x]     2    Ambulatory and capable of self care, unable to do work activities, up and about >50 % of waking hours                              []     3    Only limited self care, in bed greater than 50% of waking hours []     4    Completely disabled, no self care, confined to bed or chair []     5    Moribund  Past Medical History:  Diagnosis Date  . Acute respiratory failure with hypoxia ([place])   . Anxiety   . Aortic atherosclerosis ([place])   . Arthritis    "a little in my hands probably" (10/31/2017)  .  COPD (chronic obstructive pulmonary disease) ([place])   . COPD exacerbation ([place])   . Depression   . History of kidney stones   . Suicide attempt ([place]) 02/21/2018   overdosed on Wellbutrin    Past Surgical History:  Procedure Laterality Date  . ABDOMINAL HYSTERECTOMY    . APPENDECTOMY    . BREAST BIOPSY Left 1990s  . CATARACT EXTRACTION W/ INTRAOCULAR LENS  IMPLANT, BILATERAL Bilateral   . DILATION AND CURETTAGE OF UTERUS    . TONSILLECTOMY    . TUBAL LIGATION      Family History  Problem Relation Age of Onset  . Breast cancer Mother     Social History Social History   Tobacco Use  . Smoking status: Former Smoker    Packs/day: 1.50    Years: 51.00    Pack years: 76.50    Types: Cigarettes    Quit date: 09/05/2017    Years since quitting: 1.1  . Smokeless tobacco: Never Used  Substance Use Topics  . Alcohol use: Not Currently    Comment: 02/21/2018 "couple drinks/year"  . Drug use:  Never    Current Outpatient Medications  Medication Sig Dispense Refill  . acetaminophen (TYLENOL) 500 MG tablet Take 1,000 mg by mouth every 6 (six) hours as needed for mild pain or headache.    . albuterol (PROVENTIL HFA;VENTOLIN HFA) 108 (90 Base) MCG/ACT inhaler Inhale 2 puffs into the lungs every 6 (six) hours as needed for wheezing or shortness of breath. 1 Inhaler 2  . albuterol (PROVENTIL) (2.5 MG/3ML) 0.083% nebulizer solution Take 3 mLs (2.5 mg total) by nebulization every 6 (six) hours as needed for wheezing or shortness of breath. 75 mL 6  . alendronate (FOSAMAX) 70 MG tablet Take 70 mg by mouth every Wednesday.     . budesonide-formoterol (SYMBICORT) 160-4.5 MCG/ACT inhaler Inhale 2 puffs into the lungs 2 (two) times daily. 1 Inhaler 0  . Cholecalciferol (VITAMIN D) 50 MCG (2000 UT) tablet Take 2,000 Units by mouth daily.    Marland Kitchen guaiFENesin (MUCINEX) 600 MG 12 hr tablet Take 600 mg by mouth 2 (two) times daily.    . montelukast (SINGULAIR) 10 MG tablet Take 1 tablet (10 mg total) by  mouth at bedtime. 30 tablet 1  . pantoprazole (PROTONIX) 40 MG tablet Take 1 tablet (40 mg total) by mouth 2 (two) times daily. 60 tablet 1  . predniSONE (DELTASONE) 10 MG tablet Take 1 tablet (10 mg total) by mouth daily with breakfast. 30 tablet 1  . sertraline (ZOLOFT) 50 MG tablet Take 1 tablet (50 mg total) by mouth daily. 30 tablet 0  . tiotropium (SPIRIVA HANDIHALER) 18 MCG inhalation capsule Place 1 capsule (18 mcg total) into inhaler and inhale daily. 30 capsule 11   No current facility-administered medications for this visit.     Allergies  Allergen Reactions  . Demerol [Meperidine] Other (See Comments)    "horrible reaction"  . Oxycodone-Acetaminophen Other (See Comments)    Upset stomach   . Tramadol Nausea And Vomiting    Review of Systems  Constitutional: Positive for appetite change (Increased on steroids) and unexpected weight change (Has gained 10 pounds on prednisone). Negative for activity change, chills and fever.  HENT: Negative for trouble swallowing and voice change.   Eyes: Negative for visual disturbance.  Respiratory: Positive for cough, shortness of breath and wheezing.   Cardiovascular: Positive for leg swelling. Negative for chest pain and palpitations.  Gastrointestinal: Positive for abdominal pain (Reflux).  Genitourinary: Negative for difficulty urinating and dysuria.  Musculoskeletal: Positive for arthralgias and joint swelling.  Neurological: Negative for seizures and weakness.  Hematological: Negative for adenopathy. Does not bruise/bleed easily.  Psychiatric/Behavioral: Positive for dysphoric mood. The patient is nervous/anxious.   All other systems reviewed and are negative.   BP 135/76 (BP Location: Right Arm, Patient Position: Sitting, Cuff Size: Large)   Pulse 100   Temp 97.7 F (36.5 C) (Skin)   Resp 18   Ht 5\' 5"  (1.651 m)   Wt 177 lb (80.3 kg)   SpO2 93% Comment: RA  BMI 29.45 kg/m  Physical Exam Vitals signs reviewed.   Constitutional:      General: Lori Butler is not in acute distress.    Appearance: Lori Butler is obese.  HENT:     Head: Normocephalic and atraumatic.  Eyes:     General: No scleral icterus.    Extraocular Movements: Extraocular movements intact.  Neck:     Musculoskeletal: Neck supple.  Cardiovascular:     Rate and Rhythm: Normal rate and regular rhythm.     Heart sounds: Normal heart sounds.  No murmur. No friction rub. No gallop.   Pulmonary:     Effort: Pulmonary effort is normal. No respiratory distress.     Breath sounds: No wheezing.     Comments: Diminished breath sounds bilaterally Abdominal:     General: There is no distension.     Palpations: Abdomen is soft.     Tenderness: There is no abdominal tenderness.  Musculoskeletal:        General: No swelling.  Lymphadenopathy:     Cervical: No cervical adenopathy.  Skin:    General: Skin is warm and dry.  Neurological:     General: No focal deficit present.     Mental Status: Lori Butler is alert and oriented to person, place, and time.     Cranial Nerves: No cranial nerve deficit.     Motor: No weakness.  Psychiatric:        Mood and Affect: Mood normal.    Diagnostic Tests: CT CHEST WITHOUT CONTRAST LOW-DOSE FOR LUNG CANCER SCREENING  TECHNIQUE: Multidetector CT imaging of the chest was performed following the standard protocol without IV contrast.  COMPARISON:  12/09/2017 and 09/15/2017.  FINDINGS: Cardiovascular: Atherosclerotic calcification of the aorta and coronary arteries. Heart size normal. Small amount of pericardial fluid is likely physiologic.  Mediastinum/Nodes: No pathologically enlarged mediastinal or axillary lymph nodes. Hilar regions are difficult to evaluate without IV contrast but appear grossly unremarkable. Esophagus is grossly unremarkable.  Lungs/Pleura: Biapical pleuroparenchymal scarring. Spiculated nodule in the apical left upper lobe measures 12.6 mm (series 3, image 71). 3.6 mm left lower  lobe nodule is nonspecific. No pleural fluid. Airway is unremarkable.  Upper Abdomen: Vague low-attenuation lesion in the liver measures approximately 1.3 cm, as before. Visualized portions of the adrenal glands, kidneys, spleen, pancreas, stomach and bowel are unremarkable. Small hiatal hernia. No upper abdominal adenopathy.  Musculoskeletal: No worrisome lytic or sclerotic lesions.  IMPRESSION: 1. Lung-RADS 4X, highly suspicious. Consultation with pulmonary medicine or thoracic surgery is recommended, as well as staging evaluation with PET imaging, as clinically indicated. These results will be called to the ordering clinician or representative by the Radiologist Assistant, and communication documented in the PACS or zVision Dashboard. 2. Aortic atherosclerosis (ICD10-170.0). Coronary artery calcification. 3.  Emphysema (ICD10-J43.9).   Electronically Signed   By: Lorin Picket M.D.   On: 10/14/2018 12:42 NUCLEAR MEDICINE PET SKULL BASE TO THIGH  TECHNIQUE: 8.8 mCi F-18 FDG was injected intravenously. Full-ring PET imaging was performed from the skull base to thigh after the radiotracer. CT data was obtained and used for attenuation correction and anatomic localization.  Fasting blood glucose: 70 mg/dl  COMPARISON:  Chest CT 10/14/2018.  FINDINGS: Mediastinal blood pool activity: SUV max 2.8  Liver activity: SUV max NA  NECK: Extensive muscular activity throughout the neck and upper chest. Given this limitation, No abnormal cervical hypermetabolism.  Incidental CT findings: No cervical adenopathy.  CHEST: Hypermetabolism corresponding to the left upper lobe spiculated pulmonary nodule. Example at 1.3 cm and a S.U.V. max of 8.4 on image 18/8.  No thoracic nodal hypermetabolism.  Incidental CT findings: Deferred to recent diagnostic CT. Aortic and coronary artery atherosclerosis. Centrilobular emphysema with biapical pleuroparenchymal  scarring.  ABDOMEN/PELVIS: No abdominopelvic parenchymal or nodal hypermetabolism.  Incidental CT findings: Normal adrenal glands. Abdominal aortic atherosclerosis. Segment 4a low-density liver lesion is likely a cyst at 1.1 cm. Hysterectomy.  SKELETON: No abnormal marrow activity.  Incidental CT findings: none  IMPRESSION: 1. Left upper lobe hypermetabolic spiculated pulmonary nodule, consistent with  primary bronchogenic carcinoma. No hypermetabolic thoracic nodal or extrathoracic metastasis identified. Presuming non-small-cell histology, T1bN0M0 or stage IA. 2. Extensive muscular activity throughout the neck and chest, mildly limiting evaluation. 3. Aortic atherosclerosis (ICD10-I70.0), coronary artery atherosclerosis and emphysema (ICD10-J43.9).   Electronically Signed   By: Abigail Miyamoto M.D.   On: 10/18/2018 16:25 I personally reviewed the CT and PET/CT images and concur with the findings noted above  Impression: Lori Butler is a 68 year old woman with a history of tobacco abuse, COPD, aortic atherosclerosis, arthritis, depression, and anxiety.  Lori Butler had a low-dose screening CT earlier this month which showed a 1.3 cm left upper lobe nodule and biapical scarring.  On PET CT the left upper lobe nodule is markedly hypermetabolic with an SUV of 8.  I reviewed the images with Lori Butler and her son.  We discussed the differential diagnosis.  This almost certainly is a non-small cell carcinoma.  Infectious or inflammatory nodules are in the differential but are far less likely.  We need to consider this to be a non-small cell carcinoma unless we can prove otherwise.  We discussed potential treatment options including surgery and radiation.  This is not amenable to a wedge resection but Lori Butler would be a candidate for a segmentectomy.  I think that would have some negative impact on her respiratory capacity, but I do not think it would result in unacceptable quality of life.  Lori Butler  understands the relative advantages and disadvantages of surgery and radiation.    I did describe the general nature of the VATS and discussed the indications, risks, benefits, and alternatives with Lori Butler and her son.  We also briefly discussed stereotactic radiation.  Lori Butler does not want undergo surgery and is strongly interested in radiation therapy.  We discussed navigational bronchoscopy to establish a diagnosis.  I informed her of the general nature of that procedure including the need for general anesthesia.  Lori Butler is aware that it is endoscopic.  We will plan to do it on an outpatient basis.  We discussed the indications, risk, benefits, and alternatives.  Lori Butler understands the risk include failure to make a diagnosis, pneumothorax, bleeding, as well as risks associated with general anesthesia such as MI and DVT and PE.  Lori Butler accepts those risks and wishes to proceed.  COPD-currently reasonably well controlled on Spiriva, Singulair, and prednisone.  Plan: Electromagnetic navigational bronchoscopy for biopsy and fiducial placement on Monday, 11/11/2018 We will arrange her to be seen in our multidisciplinary thoracic oncology clinic by radiation oncology on Thursday, 11/14/2018  Melrose Nakayama, MD [facility] [phone]

## 2018-11-04 NOTE — Pre-Procedure Instructions (Signed)
[facility] [address] [place] [address] Phone: [phone] Fax: [facility], [place] [address] Phone: [phone] Fax: [phone]      Your procedure is scheduled on  11-11-18 Monday  Report to [facility] Main Entrance "A" at 0530 A.M., and check in at the Admitting office.  Call this number if you have problems the morning of surgery:  [phone]  Call [phone] if you have any questions prior to your surgery date Monday-Friday 8am-4pm    Remember:  Do not eat or drink after midnight.  Take these medicines the morning of surgery with A SIP OF WATER : budesonide-formoterol (SYMBICORT) guaiFENesin (MUCINEX) pantoprazole (PROTONIX) predniSONE (DELTASONE) sertraline (ZOLOFT)  tiotropium (SPIRIVA HANDIHALER albuterol (PROVENTIL HFA;VENTOLIN HFA)as needed.  7 days prior to surgery STOP taking any Aspirin (unless otherwise instructed by your surgeon), Aleve, Naproxen, Ibuprofen, Motrin, Advil, Goody's, BC's, all herbal medications, fish oil, and all vitamins.  Bring your inhaler with you on the day of surgery.   The Morning of Surgery  Do not wear jewelry, make-up or nail polish.  Do not wear lotions, powders, or perfumes, or deodorant  Do not bring valuables to the hospital.  [facility] is not responsible for any belongings or valuables.  If you are a smoker, DO NOT Smoke 24 hours prior to surgery IF you wear a CPAP at night please bring your mask, tubing, and machine the morning of surgery   Remember that you must have someone to transport you home after your surgery, and remain with you for 24 hours if you are discharged the same day.   Contacts, glasses, hearing aids, dentures or bridgework may not be worn into surgery.    Leave your suitcase in the car.  After surgery it may be brought to your  room.  For patients admitted to the hospital, discharge time will be determined by your treatment team.  Patients discharged the day of surgery will not be allowed to drive home.    Special instructions:   [place]- Preparing For Surgery  Before surgery, you can play an important role. Because skin is not sterile, your skin needs to be as free of germs as possible. You can reduce the number of germs on your skin by washing with CHG (chlorahexidine gluconate) Soap before surgery.  CHG is an antiseptic cleaner which kills germs and bonds with the skin to continue killing germs even after washing.    Oral Hygiene is also important to reduce your risk of infection.  Remember - BRUSH YOUR TEETH THE MORNING OF SURGERY WITH YOUR REGULAR TOOTHPASTE  Please do not use if you have an allergy to CHG or antibacterial soaps. If your skin becomes reddened/irritated stop using the CHG.  Do not shave (including legs and underarms) for at least 48 hours prior to first CHG shower. It is OK to shave your face.  Please follow these instructions carefully.   1. Shower the NIGHT BEFORE SURGERY and the MORNING OF SURGERY with CHG Soap.   2. If you chose to wash your hair, wash your hair first as usual with your normal shampoo.  3. After you shampoo, rinse your hair and body thoroughly to remove the shampoo.  4. Use CHG as you would any other liquid soap. You can apply CHG directly to the skin and wash gently with a scrungie or a clean  washcloth.   5. Apply the CHG Soap to your body ONLY FROM THE NECK DOWN.  Do not use on open wounds or open sores. Avoid contact with your eyes, ears, mouth and genitals (private parts). Wash Face and genitals (private parts)  with your normal soap.   6. Wash thoroughly, paying special attention to the area where your surgery will be performed.  7. Thoroughly rinse your body with warm water from the neck down.  8. DO NOT shower/wash with your normal soap after using and  rinsing off the CHG Soap.  9. Pat yourself dry with a CLEAN TOWEL.  10. Wear CLEAN PAJAMAS to bed the night before surgery, wear comfortable clothes the morning of surgery  11. Place CLEAN SHEETS on your bed the night of your first shower and DO NOT SLEEP WITH PETS.    Day of Surgery:  Do not apply any deodorants/lotions.  Please wear clean clothes to the hospital/surgery center.   Remember to brush your teeth WITH YOUR REGULAR TOOTHPASTE.   Please read over the following fact sheets that you were given.

## 2018-11-05 ENCOUNTER — Encounter (HOSPITAL_COMMUNITY): Payer: Self-pay

## 2018-11-05 ENCOUNTER — Encounter (HOSPITAL_COMMUNITY)
Admission: RE | Admit: 2018-11-05 | Discharge: 2018-11-05 | Disposition: A | Discharge status: Home / Self Care | Payer: Medicare HMO | Source: Ambulatory Visit | Attending: Thoracic Surgery (Cardiothoracic Vascular Surgery) | Admitting: Thoracic Surgery (Cardiothoracic Vascular Surgery)

## 2018-11-05 ENCOUNTER — Other Ambulatory Visit: Payer: Self-pay

## 2018-11-05 DIAGNOSIS — I700 Atherosclerosis of aorta: Secondary | ICD-10-CM | POA: Diagnosis not present

## 2018-11-05 DIAGNOSIS — R6889 Other general symptoms and signs: Secondary | ICD-10-CM | POA: Diagnosis not present

## 2018-11-05 DIAGNOSIS — Z79899 Other long term (current) drug therapy: Secondary | ICD-10-CM | POA: Diagnosis not present

## 2018-11-05 DIAGNOSIS — J449 Chronic obstructive pulmonary disease, unspecified: Secondary | ICD-10-CM | POA: Diagnosis not present

## 2018-11-05 DIAGNOSIS — Z01812 Encounter for preprocedural laboratory examination: Secondary | ICD-10-CM | POA: Insufficient documentation

## 2018-11-05 DIAGNOSIS — F329 Major depressive disorder, single episode, unspecified: Secondary | ICD-10-CM | POA: Insufficient documentation

## 2018-11-05 DIAGNOSIS — Z1159 Encounter for screening for other viral diseases: Secondary | ICD-10-CM | POA: Diagnosis not present

## 2018-11-05 DIAGNOSIS — R911 Solitary pulmonary nodule: Secondary | ICD-10-CM | POA: Insufficient documentation

## 2018-11-05 DIAGNOSIS — Z87891 Personal history of nicotine dependence: Secondary | ICD-10-CM | POA: Insufficient documentation

## 2018-11-05 LAB — COMPREHENSIVE METABOLIC PANEL
ALT: 26 U/L (ref 0–44)
AST: 26 U/L (ref 15–41)
Albumin: 3.8 g/dL (ref 3.5–5.0)
Alkaline Phosphatase: 76 U/L (ref 38–126)
Anion gap: 11 (ref 5–15)
BUN: 19 mg/dL (ref 8–23)
CO2: 22 mmol/L (ref 22–32)
Calcium: 8.8 mg/dL — ABNORMAL LOW (ref 8.9–10.3)
Chloride: 105 mmol/L (ref 98–111)
Creatinine, Ser: 0.93 mg/dL (ref 0.44–1.00)
GFR calc Af Amer: >60 mL/min (ref >60)
GFR calc non Af Amer: >60 mL/min (ref >60)
Glucose, Bld: 99 mg/dL (ref 70–99)
Potassium: 4 mmol/L (ref 3.5–5.1)
Sodium: 138 mmol/L (ref 135–145)
Total Bilirubin: 0.9 mg/dL (ref 0.3–1.2)
Total Protein: 6.7 g/dL (ref 6.5–8.1)

## 2018-11-05 LAB — PROTIME-INR
INR: 1 (ref 0.8–1.2)
Prothrombin Time: 12.9 seconds (ref 11.4–15.2)

## 2018-11-05 LAB — CBC
HCT: 40.6 % (ref 36.0–46.0)
Hemoglobin: 12.9 g/dL (ref 12.0–15.0)
MCH: 29.4 pg (ref 26.0–34.0)
MCHC: 31.8 g/dL (ref 30.0–36.0)
MCV: 92.5 fL (ref 80.0–100.0)
Platelets: 335 10*3/uL (ref 150–400)
RBC: 4.39 MIL/uL (ref 3.87–5.11)
RDW: 13.2 % (ref 11.5–15.5)
WBC: 11.7 10*3/uL — ABNORMAL HIGH (ref 4.0–10.5)
nRBC: 0 % (ref 0.0–0.2)

## 2018-11-05 LAB — APTT: aPTT: 27 seconds (ref 24–36)

## 2018-11-05 NOTE — Progress Notes (Addendum)
PCP - [name], MD Cardiologist - pt denies  Chest x-ray - to be done Day of surgery per order (within 72 hours) EKG - 05/06/2018  Stress Test - pt denies ECHO - pt denies  Cardiac Cath - pt denies  Sleep Study - pt denies CPAP - n/a  Fasting Blood Sugar - n/a  Checks Blood Sugar _____ times a day-n/a  Blood Thinner Instructions: n/a Aspirin Instructions: n/a  Anesthesia review: No  Patient denies shortness of breath, fever, cough and chest pain at PAT appointment  Patient verbalized understanding of instructions that were given to them at the PAT appointment. Patient was also instructed that they will need to review over the PAT instructions again at home before surgery.

## 2018-11-06 ENCOUNTER — Telehealth: Payer: Self-pay | Admitting: *Deleted

## 2018-11-06 NOTE — Telephone Encounter (Signed)
Oncology Nurse Navigator Documentation  Oncology Nurse Navigator Flowsheets 11/06/2018  Navigator Location [facility]  Referral Date to RadOnc/MedOnc 11/05/2018  Navigator Encounter Type Telephone/I called Ms. Rossetti due to referral from Dr. Leonarda Salon office.  I updated her on the appt for [place] on 11/14/2018.  She verbalized understanding of appt time and place.  I will update his office of appt.   Telephone Outgoing Call  Treatment Phase Abnormal Scans  Barriers/Navigation Needs Coordination of Care;Education  Education Other  Interventions Coordination of Care;Education  Coordination of Care Other  Education Method Verbal  Acuity Level 1  Time Spent with Patient 15

## 2018-11-06 NOTE — Progress Notes (Signed)
'@Patient'  ID: Lori Butler, female    DOB: 09-15-1950, 68 y.o.   MRN: [id number]  Chief Complaint  Patient presents with   Follow-up    COPD: Patient reports that her breathing has gotten a little better since taking Prednisone daily. She reports a productive cough early mornings. She reports using her nebulizer twice daily.     Referring provider: [name], MD  HPI: 68 year old female, former smoker quit May 2019 (74 pack year hx). PMH significant for COPD, hypoxia, anxiety/depression, attempted suicide, tobacco abuse. Patient of Dr. Elsworth Soho, last seen on 07/17/18. Treated twice over the phone for COPD exacerbation in April. Eos absolute in August 2019 700. Sudden onset and variability seem to suggest component of asthma. Maintained on Symbicort 160, Spiriva and Singulair.   Previous [place] encounter: 09/16/2018- Televisit, 2 month fu- Lori Effertz,NP Patient called today for 2 month follow-up visit. Complains of shortness of breath over the past couple of days. Associated cough with thick white mucus, dark in the am and then clears. She does get relief from oral steroid course, feels better for a about a week or two. No fever, sick contacts or flu-like symptoms. Advised to stay on prednisone 87m daily x 6 weeks until follow-up. Referred to lung cancer screening clinic.   10/22/2018- Televisit, PET scan results - Dr. AElsworth Soho67 yoex-smokerfor FU of moderate COPD/asthma Sudden onset & variability suggesting a component of asthma She quit smoking 09/2017  She has been maintained on 10 mg of prednisone since she had a phone visit 09/16/2018 for COPD exacerbation.  She has done well since then on a breathing standpoint.  She is maintained on Spiriva and Symbicort with which she is compliant.  She takes albuterol nebs 2-3 times a day when she remembers to take this. She was referred to lung cancer screening and underwent CT chest 10/14/2018 which I reviewed with her in detail-she had bilateral upper  lobe parenchymal scarring on her prior CT and left upper lobe scar that appeared more nodular on the CT measuring about 13 mm  PET scan showed hypermetabolism with SUV 8 in this nodule She underwent PFTs which showed moderate reversible airway obstruction with FEV1 of 37%-0.91 which improved to 47%-1.1L DLCO was about 54%  11/07/2018 Patient presents today for 6 week follow-up office visit. Since last televisit with myself patient met with lung cancer screening program and LDCT showed lung RADS 4x. PET scan positive LUL hypermetabolic spiculated pulmonary nodules consistent with primary bronchogenic carcinoma. Referred to CT surgery for second opinion. Seen by Dr. HRoxan Hockeyon 10/29/18. 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 Potential treatment options were discussed. Patient is not interested in surgery and is strongly interested in radiation therapy. Plan navigation bronchoscopy for bx and fiducial placement on Monday 11/11/18.   She is doing well today, breathing has improved since starting prednisone 147mdaily. Plan to taper 11m42m 4 weeks and then stop. Continues Symbicort twice daily and Spriva once daily. Using nebulizer twice daily. Morning cough with clear mucus.    Allergies  Allergen Reactions   Demerol [Meperidine] Other (See Comments)    "horrible reaction"   Oxycodone-Acetaminophen Other (See Comments)    Upset stomach    Tramadol Nausea And Vomiting    Immunization History  Administered Date(s) Administered   Influenza, High Dose Seasonal PF 01/21/2018    Past Medical History:  Diagnosis Date   Acute respiratory failure with hypoxia (HCC)    Anxiety  Aortic atherosclerosis ([place])    Arthritis    "a little in my hands probably" (10/31/2017)   COPD (chronic obstructive pulmonary disease) (HCC)    COPD exacerbation (HCC)    Depression    History of kidney stones    Suicide attempt ([place]) 02/21/2018   overdosed on Wellbutrin     Tobacco History: Social History   Tobacco Use  Smoking Status Former Smoker   Packs/day: 1.50   Years: 51.00   Pack years: 76.50   Types: Cigarettes   Quit date: 09/05/2017   Years since quitting: 1.1  Smokeless Tobacco Never Used   Counseling given: Not Answered   Outpatient Medications Prior to Visit  Medication Sig Dispense Refill   albuterol (PROVENTIL HFA;VENTOLIN HFA) 108 (90 Base) MCG/ACT inhaler Inhale 2 puffs into the lungs every 6 (six) hours as needed for wheezing or shortness of breath. 1 Inhaler 2   albuterol (PROVENTIL) (2.5 MG/3ML) 0.083% nebulizer solution Take 3 mLs (2.5 mg total) by nebulization every 6 (six) hours as needed for wheezing or shortness of breath. 75 mL 6   alendronate (FOSAMAX) 70 MG tablet Take 70 mg by mouth every Wednesday.      budesonide-formoterol (SYMBICORT) 160-4.5 MCG/ACT inhaler Inhale 2 puffs into the lungs 2 (two) times daily. 1 Inhaler 0   guaiFENesin (MUCINEX) 600 MG 12 hr tablet Take 600 mg by mouth 2 (two) times daily.      montelukast (SINGULAIR) 10 MG tablet Take 1 tablet (10 mg total) by mouth at bedtime. 30 tablet 1   pantoprazole (PROTONIX) 40 MG tablet Take 1 tablet (40 mg total) by mouth 2 (two) times daily. 60 tablet 1   sertraline (ZOLOFT) 50 MG tablet Take 1 tablet (50 mg total) by mouth daily. 30 tablet 0   tiotropium (SPIRIVA HANDIHALER) 18 MCG inhalation capsule Place 1 capsule (18 mcg total) into inhaler and inhale daily. 30 capsule 11   predniSONE (DELTASONE) 10 MG tablet Take 1 tablet (10 mg total) by mouth daily with breakfast. 30 tablet 1   Cholecalciferol (VITAMIN D) 50 MCG (2000 UT) tablet Take 2,000 Units by mouth daily.     No facility-administered medications prior to visit.    Review of Systems  Review of Systems  Constitutional: Negative.   Respiratory: Positive for cough. Negative for shortness of breath and wheezing.     Physical Exam  BP 120/70 (BP Location: Left Arm, Cuff Size:  Normal)    Pulse 88    Temp 98.3 F (36.8 C) (Oral)    Ht '5\' 5"'  (1.651 m)    Wt 178 lb 6.4 oz (80.9 kg)    SpO2 97%    BMI 29.69 kg/m  Physical Exam Constitutional:      General: She is not in acute distress.    Appearance: Normal appearance. She is not ill-appearing.  HENT:     Head: Normocephalic and atraumatic.  Cardiovascular:     Rate and Rhythm: Normal rate and regular rhythm.  Pulmonary:     Effort: Pulmonary effort is normal.     Breath sounds: No wheezing or rhonchi.     Comments: Lungs clear on exam  Neurological:     General: No focal deficit present.     Mental Status: She is alert and oriented to person, place, and time. Mental status is at baseline.  Psychiatric:        Mood and Affect: Mood normal.        Behavior: Behavior normal.  Thought Content: Thought content normal.        Judgment: Judgment normal.      Lab Results:  CBC    Component Value Date/Time   WBC 11.7 (H) 11/05/2018 0923   RBC 4.39 11/05/2018 0923   HGB 12.9 11/05/2018 0923   HCT 40.6 11/05/2018 0923   PLT 335 11/05/2018 0923   MCV 92.5 11/05/2018 0923   MCH 29.4 11/05/2018 0923   MCHC 31.8 11/05/2018 0923   RDW 13.2 11/05/2018 0923   LYMPHSABS 2.5 09/16/2018 0954   MONOABS 0.7 09/16/2018 0954   EOSABS 0.5 09/16/2018 0954   BASOSABS 0.2 (H) 09/16/2018 0954    BMET    Component Value Date/Time   NA 138 11/05/2018 0923   NA 140 11/06/2017   K 4.0 11/05/2018 0923   CL 105 11/05/2018 0923   CO2 22 11/05/2018 0923   GLUCOSE 99 11/05/2018 0923   BUN 19 11/05/2018 0923   BUN 17 11/06/2017   CREATININE 0.93 11/05/2018 0923   CALCIUM 8.8 (L) 11/05/2018 0923   GFRNONAA >60 11/05/2018 0923   GFRAA >60 11/05/2018 0923    BNP No results found for: BNP  ProBNP No results found for: PROBNP  Imaging: Nm Pet Image Initial (pi) Skull Base To Thigh  Result Date: 10/18/2018 CLINICAL DATA:  Initial treatment strategy for abnormal lung cancer screening exam demonstrating a  spiculated left apical pulmonary nodule. EXAM: NUCLEAR MEDICINE PET SKULL BASE TO THIGH TECHNIQUE: 8.8 mCi F-18 FDG was injected intravenously. Full-ring PET imaging was performed from the skull base to thigh after the radiotracer. CT data was obtained and used for attenuation correction and anatomic localization. Fasting blood glucose: 70 mg/dl COMPARISON:  Chest CT 10/14/2018. FINDINGS: Mediastinal blood pool activity: SUV max 2.8 Liver activity: SUV max NA NECK: Extensive muscular activity throughout the neck and upper chest. Given this limitation, No abnormal cervical hypermetabolism. Incidental CT findings: No cervical adenopathy. CHEST: Hypermetabolism corresponding to the left upper lobe spiculated pulmonary nodule. Example at 1.3 cm and a S.U.V. max of 8.4 on image 18/8. No thoracic nodal hypermetabolism. Incidental CT findings: Deferred to recent diagnostic CT. Aortic and coronary artery atherosclerosis. Centrilobular emphysema with biapical pleuroparenchymal scarring. ABDOMEN/PELVIS: No abdominopelvic parenchymal or nodal hypermetabolism. Incidental CT findings: Normal adrenal glands. Abdominal aortic atherosclerosis. Segment 4a low-density liver lesion is likely a cyst at 1.1 cm. Hysterectomy. SKELETON: No abnormal marrow activity. Incidental CT findings: none IMPRESSION: 1. Left upper lobe hypermetabolic spiculated pulmonary nodule, consistent with primary bronchogenic carcinoma. No hypermetabolic thoracic nodal or extrathoracic metastasis identified. Presuming non-small-cell histology, T1bN0M0 or stage IA. 2. Extensive muscular activity throughout the neck and chest, mildly limiting evaluation. 3. Aortic atherosclerosis (ICD10-I70.0), coronary artery atherosclerosis and emphysema (ICD10-J43.9). Electronically Signed   By: Abigail Miyamoto M.D.   On: 10/18/2018 16:25   Ct Chest Lung Ca Screen Low Dose W/o Cm  Result Date: 10/14/2018 CLINICAL DATA:  Former smoker, quit 1 year ago, 76 pack-year history.  EXAM: CT CHEST WITHOUT CONTRAST LOW-DOSE FOR LUNG CANCER SCREENING TECHNIQUE: Multidetector CT imaging of the chest was performed following the standard protocol without IV contrast. COMPARISON:  12/09/2017 and 09/15/2017. FINDINGS: Cardiovascular: Atherosclerotic calcification of the aorta and coronary arteries. Heart size normal. Small amount of pericardial fluid is likely physiologic. Mediastinum/Nodes: No pathologically enlarged mediastinal or axillary lymph nodes. Hilar regions are difficult to evaluate without IV contrast but appear grossly unremarkable. Esophagus is grossly unremarkable. Lungs/Pleura: Biapical pleuroparenchymal scarring. Spiculated nodule in the apical left upper lobe measures 12.6  mm (series 3, image 71). 3.6 mm left lower lobe nodule is nonspecific. No pleural fluid. Airway is unremarkable. Upper Abdomen: Vague low-attenuation lesion in the liver measures approximately 1.3 cm, as before. Visualized portions of the adrenal glands, kidneys, spleen, pancreas, stomach and bowel are unremarkable. Small hiatal hernia. No upper abdominal adenopathy. Musculoskeletal: No worrisome lytic or sclerotic lesions. IMPRESSION: 1. Lung-RADS 4X, highly suspicious. Consultation with pulmonary medicine or thoracic surgery is recommended, as well as staging evaluation with PET imaging, as clinically indicated. These results will be called to the ordering clinician or representative by the Radiologist Assistant, and communication documented in the PACS or zVision Dashboard. 2. Aortic atherosclerosis (ICD10-170.0). Coronary artery calcification. 3.  Emphysema (ICD10-J43.9). Electronically Signed   By: Lorin Picket M.D.   On: 10/14/2018 12:42     Assessment & Plan:   COPD (chronic obstructive pulmonary disease) (HCC) - COPD/asthma overlap  - Improved with 77m prednisone qd x 6 weeks - Plan taper 54mdaily x 4 weeks and then stop  - Continue Symbicort 160 2bid and Spiriva qd  Solitary pulmonary  nodule on lung CT - LDCT showed lung RADS 4x - PET scan positive LUL hypermetabolic spiculated pulmonary nodule - Referred to CT surgery for second opinion. Seen by Dr. HeRoxan Hockeyn 10/29/18 - Patient is not interested in surgery and is strongly interested in radiation therapy - Plan navigation bronchoscopy for bx and fiducial placement on Monday 11/11/18  FU in 1 month with Dr. AlCandise CheNP 11/07/2018

## 2018-11-07 ENCOUNTER — Encounter: Payer: Medicare HMO | Admitting: Thoracic Surgery (Cardiothoracic Vascular Surgery)

## 2018-11-07 ENCOUNTER — Other Ambulatory Visit: Payer: Self-pay

## 2018-11-07 ENCOUNTER — Other Ambulatory Visit (HOSPITAL_COMMUNITY): Payer: Medicare HMO

## 2018-11-07 ENCOUNTER — Ambulatory Visit (INDEPENDENT_AMBULATORY_CARE_PROVIDER_SITE_OTHER): Payer: Medicare HMO | Admitting: Primary Care

## 2018-11-07 ENCOUNTER — Encounter: Payer: Self-pay | Admitting: Primary Care

## 2018-11-07 ENCOUNTER — Other Ambulatory Visit (HOSPITAL_COMMUNITY)
Admission: RE | Admit: 2018-11-07 | Discharge: 2018-11-07 | Disposition: A | Discharge status: Home / Self Care | Payer: Medicare HMO | Source: Ambulatory Visit | Attending: Thoracic Surgery (Cardiothoracic Vascular Surgery) | Admitting: Thoracic Surgery (Cardiothoracic Vascular Surgery)

## 2018-11-07 DIAGNOSIS — Z87891 Personal history of nicotine dependence: Secondary | ICD-10-CM | POA: Diagnosis not present

## 2018-11-07 DIAGNOSIS — R911 Solitary pulmonary nodule: Secondary | ICD-10-CM

## 2018-11-07 DIAGNOSIS — Z01812 Encounter for preprocedural laboratory examination: Secondary | ICD-10-CM | POA: Diagnosis not present

## 2018-11-07 DIAGNOSIS — I700 Atherosclerosis of aorta: Secondary | ICD-10-CM | POA: Diagnosis not present

## 2018-11-07 DIAGNOSIS — F329 Major depressive disorder, single episode, unspecified: Secondary | ICD-10-CM | POA: Diagnosis not present

## 2018-11-07 DIAGNOSIS — Z79899 Other long term (current) drug therapy: Secondary | ICD-10-CM | POA: Diagnosis not present

## 2018-11-07 DIAGNOSIS — J449 Chronic obstructive pulmonary disease, unspecified: Secondary | ICD-10-CM | POA: Diagnosis not present

## 2018-11-07 DIAGNOSIS — Z1159 Encounter for screening for other viral diseases: Secondary | ICD-10-CM | POA: Diagnosis not present

## 2018-11-07 LAB — SARS CORONAVIRUS 2 (TAT 6-24 HRS): SARS Coronavirus 2: NEGATIVE

## 2018-11-07 MED ORDER — PREDNISONE 10 MG PO TABS: ORAL_TABLET | ORAL | 0 refills | Status: DC

## 2018-11-07 NOTE — Patient Instructions (Addendum)
Decreased prednisone 1/2 tab x 4 weeks and then stop   Follow up with Dr. Elsworth Soho in 1 month

## 2018-11-07 NOTE — Assessment & Plan Note (Signed)
-   COPD/asthma overlap  - Improved with 10mg  prednisone qd x 6 weeks - Plan taper 5mg  daily x 4 weeks and then stop  - Continue Symbicort 160 2bid and Spiriva qd

## 2018-11-07 NOTE — Assessment & Plan Note (Signed)
-   LDCT showed lung RADS 4x - PET scan positive LUL hypermetabolic spiculated pulmonary nodule - Referred to CT surgery for second opinion. Seen by Dr. Roxan Hockey on 10/29/18 - Patient is not interested in surgery and is strongly interested in radiation therapy - Plan navigation bronchoscopy for bx and fiducial placement on Monday 11/11/18

## 2018-11-10 NOTE — Anesthesia Preprocedure Evaluation (Addendum)
Anesthesia Evaluation  Patient identified by MRN, date of birth, ID band Patient awake    Reviewed: Allergy & Precautions, H&P , NPO status , Patient's Chart, lab work & pertinent test results  Airway Mallampati: II  TM Distance: >3 FB Neck ROM: Full    Dental no notable dental hx. (+) Edentulous Upper, Upper Dentures, Partial Lower, Poor Dentition   Pulmonary neg pulmonary ROS, COPD,  COPD inhaler, former smoker,    Pulmonary exam normal breath sounds clear to auscultation       Cardiovascular Exercise Tolerance: Good negative cardio ROS Normal cardiovascular exam Rhythm:Regular Rate:Normal     Neuro/Psych PSYCHIATRIC DISORDERS Anxiety Depression negative neurological ROS  negative psych ROS   GI/Hepatic negative GI ROS, Neg liver ROS,   Endo/Other  negative endocrine ROS  Renal/GU negative Renal ROS  negative genitourinary   Musculoskeletal negative musculoskeletal ROS (+) Arthritis , Osteoarthritis,    Abdominal   Peds negative pediatric ROS (+)  Hematology negative hematology ROS (+)   Anesthesia Other Findings Day of surgery medications reviewed with the patient.  Reproductive/Obstetrics negative OB ROS                            Anesthesia Physical Anesthesia Plan  ASA: III  Anesthesia Plan: General   Post-op Pain Management:    Induction:   PONV Risk Score and Plan: 3 and Ondansetron  Airway Management Planned: Oral ETT  Additional Equipment:   Intra-op Plan:   Post-operative Plan:   Informed Consent: I have reviewed the patients History and Physical, chart, labs and discussed the procedure including the risks, benefits and alternatives for the proposed anesthesia with the patient or authorized representative who has indicated his/her understanding and acceptance.       Plan Discussed with: CRNA, Anesthesiologist and Surgeon  Anesthesia Plan Comments:          Anesthesia Quick Evaluation

## 2018-11-11 ENCOUNTER — Other Ambulatory Visit: Payer: Self-pay

## 2018-11-11 ENCOUNTER — Encounter (HOSPITAL_COMMUNITY)
Admission: RE | Disposition: A | Discharge status: Home / Self Care | Payer: Self-pay | Source: Home / Self Care | Attending: Thoracic Surgery (Cardiothoracic Vascular Surgery)

## 2018-11-11 ENCOUNTER — Ambulatory Visit (HOSPITAL_COMMUNITY): Payer: Medicare HMO | Admitting: Certified Registered Nurse Anesthetist

## 2018-11-11 ENCOUNTER — Ambulatory Visit (HOSPITAL_COMMUNITY)
Admission: RE | Admit: 2018-11-11 | Discharge: 2018-11-11 | Disposition: A | Discharge status: Home / Self Care | Payer: Medicare HMO | Attending: Thoracic Surgery (Cardiothoracic Vascular Surgery) | Admitting: Thoracic Surgery (Cardiothoracic Vascular Surgery)

## 2018-11-11 ENCOUNTER — Encounter (HOSPITAL_COMMUNITY): Payer: Self-pay | Admitting: *Deleted

## 2018-11-11 ENCOUNTER — Ambulatory Visit (HOSPITAL_COMMUNITY): Payer: Medicare HMO

## 2018-11-11 DIAGNOSIS — Z87442 Personal history of urinary calculi: Secondary | ICD-10-CM | POA: Diagnosis not present

## 2018-11-11 DIAGNOSIS — J449 Chronic obstructive pulmonary disease, unspecified: Secondary | ICD-10-CM | POA: Insufficient documentation

## 2018-11-11 DIAGNOSIS — R911 Solitary pulmonary nodule: Secondary | ICD-10-CM | POA: Diagnosis not present

## 2018-11-11 DIAGNOSIS — Z01811 Encounter for preprocedural respiratory examination: Secondary | ICD-10-CM | POA: Diagnosis not present

## 2018-11-11 DIAGNOSIS — Z79899 Other long term (current) drug therapy: Secondary | ICD-10-CM | POA: Diagnosis not present

## 2018-11-11 DIAGNOSIS — F419 Anxiety disorder, unspecified: Secondary | ICD-10-CM | POA: Diagnosis not present

## 2018-11-11 DIAGNOSIS — M1990 Unspecified osteoarthritis, unspecified site: Secondary | ICD-10-CM | POA: Insufficient documentation

## 2018-11-11 DIAGNOSIS — J984 Other disorders of lung: Secondary | ICD-10-CM | POA: Diagnosis not present

## 2018-11-11 DIAGNOSIS — I700 Atherosclerosis of aorta: Secondary | ICD-10-CM | POA: Diagnosis not present

## 2018-11-11 DIAGNOSIS — F418 Other specified anxiety disorders: Secondary | ICD-10-CM | POA: Diagnosis not present

## 2018-11-11 DIAGNOSIS — R846 Abnormal cytological findings in specimens from respiratory organs and thorax: Secondary | ICD-10-CM | POA: Diagnosis not present

## 2018-11-11 DIAGNOSIS — Z885 Allergy status to narcotic agent status: Secondary | ICD-10-CM | POA: Diagnosis not present

## 2018-11-11 DIAGNOSIS — F329 Major depressive disorder, single episode, unspecified: Secondary | ICD-10-CM | POA: Diagnosis not present

## 2018-11-11 DIAGNOSIS — I2510 Atherosclerotic heart disease of native coronary artery without angina pectoris: Secondary | ICD-10-CM | POA: Diagnosis not present

## 2018-11-11 DIAGNOSIS — Z87891 Personal history of nicotine dependence: Secondary | ICD-10-CM | POA: Diagnosis not present

## 2018-11-11 DIAGNOSIS — Z7951 Long term (current) use of inhaled steroids: Secondary | ICD-10-CM | POA: Diagnosis not present

## 2018-11-11 DIAGNOSIS — Z419 Encounter for procedure for purposes other than remedying health state, unspecified: Secondary | ICD-10-CM

## 2018-11-11 HISTORY — PX: VIDEO BRONCHOSCOPY WITH ENDOBRONCHIAL NAVIGATION: SHX6175

## 2018-11-11 HISTORY — PX: FUDUCIAL PLACEMENT: SHX5083

## 2018-11-11 SURGERY — VIDEO BRONCHOSCOPY WITH ENDOBRONCHIAL NAVIGATION
Anesthesia: General

## 2018-11-11 MED ORDER — FENTANYL CITRATE (PF) 250 MCG/5ML IJ SOLN
INTRAMUSCULAR | Status: AC
  Filled 2018-11-11: qty 5

## 2018-11-11 MED ORDER — 0.9 % SODIUM CHLORIDE (POUR BTL) OPTIME
TOPICAL | Status: DC | PRN
  Administered 2018-11-11: 1000 mL

## 2018-11-11 MED ORDER — DEXAMETHASONE SODIUM PHOSPHATE 10 MG/ML IJ SOLN
INTRAMUSCULAR | Status: AC
  Filled 2018-11-11: qty 2

## 2018-11-11 MED ORDER — MEPERIDINE HCL 25 MG/ML IJ SOLN: 6.2500 mg | INTRAMUSCULAR | Status: DC | PRN

## 2018-11-11 MED ORDER — PHENYLEPHRINE 40 MCG/ML (10ML) SYRINGE FOR IV PUSH (FOR BLOOD PRESSURE SUPPORT)
PREFILLED_SYRINGE | INTRAVENOUS | Status: DC | PRN
  Administered 2018-11-11: 120 ug via INTRAVENOUS

## 2018-11-11 MED ORDER — PHENYLEPHRINE 40 MCG/ML (10ML) SYRINGE FOR IV PUSH (FOR BLOOD PRESSURE SUPPORT)
PREFILLED_SYRINGE | INTRAVENOUS | Status: AC
  Filled 2018-11-11: qty 10

## 2018-11-11 MED ORDER — ACETAMINOPHEN 160 MG/5ML PO SOLN: 325.0000 mg | ORAL | Status: DC | PRN

## 2018-11-11 MED ORDER — ROCURONIUM BROMIDE 10 MG/ML (PF) SYRINGE
PREFILLED_SYRINGE | INTRAVENOUS | Status: AC
  Filled 2018-11-11: qty 10

## 2018-11-11 MED ORDER — DEXAMETHASONE SODIUM PHOSPHATE 10 MG/ML IJ SOLN
INTRAMUSCULAR | Status: DC | PRN
  Administered 2018-11-11: 10 mg via INTRAVENOUS

## 2018-11-11 MED ORDER — EPINEPHRINE PF 1 MG/ML IJ SOLN
INTRAMUSCULAR | Status: AC
  Filled 2018-11-11: qty 1

## 2018-11-11 MED ORDER — PROPOFOL 10 MG/ML IV BOLUS
INTRAVENOUS | Status: AC
  Filled 2018-11-11: qty 20

## 2018-11-11 MED ORDER — FENTANYL CITRATE (PF) 250 MCG/5ML IJ SOLN
INTRAMUSCULAR | Status: DC | PRN
  Administered 2018-11-11: 25 ug via INTRAVENOUS
  Administered 2018-11-11: 100 ug via INTRAVENOUS
  Administered 2018-11-11: 50 ug via INTRAVENOUS
  Administered 2018-11-11: 25 ug via INTRAVENOUS
  Administered 2018-11-11: 50 ug via INTRAVENOUS

## 2018-11-11 MED ORDER — SUCCINYLCHOLINE CHLORIDE 200 MG/10ML IV SOSY
PREFILLED_SYRINGE | INTRAVENOUS | Status: DC | PRN
  Administered 2018-11-11: 100 mg via INTRAVENOUS

## 2018-11-11 MED ORDER — ROCURONIUM BROMIDE 50 MG/5ML IV SOSY
PREFILLED_SYRINGE | INTRAVENOUS | Status: DC | PRN
  Administered 2018-11-11: 20 mg via INTRAVENOUS
  Administered 2018-11-11: 50 mg via INTRAVENOUS

## 2018-11-11 MED ORDER — EPHEDRINE 5 MG/ML INJ
INTRAVENOUS | Status: AC
  Filled 2018-11-11: qty 10

## 2018-11-11 MED ORDER — ONDANSETRON HCL 4 MG/2ML IJ SOLN
INTRAMUSCULAR | Status: AC
  Filled 2018-11-11: qty 2

## 2018-11-11 MED ORDER — EPINEPHRINE PF 1 MG/ML IJ SOLN
INTRAMUSCULAR | Status: DC | PRN
  Administered 2018-11-11: 1 mg via ENDOTRACHEOPULMONARY

## 2018-11-11 MED ORDER — LIDOCAINE 2% (20 MG/ML) 5 ML SYRINGE
INTRAMUSCULAR | Status: DC | PRN
  Administered 2018-11-11: 50 mg via INTRAVENOUS

## 2018-11-11 MED ORDER — ACETAMINOPHEN 325 MG PO TABS: 325.0000 mg | ORAL_TABLET | ORAL | Status: DC | PRN

## 2018-11-11 MED ORDER — SUGAMMADEX SODIUM 200 MG/2ML IV SOLN
INTRAVENOUS | Status: DC | PRN
  Administered 2018-11-11: 200 mg via INTRAVENOUS

## 2018-11-11 MED ORDER — MIDAZOLAM HCL 5 MG/5ML IJ SOLN
INTRAMUSCULAR | Status: DC | PRN
  Administered 2018-11-11: 1 mg via INTRAVENOUS

## 2018-11-11 MED ORDER — LIDOCAINE 2% (20 MG/ML) 5 ML SYRINGE
INTRAMUSCULAR | Status: AC
  Filled 2018-11-11: qty 5

## 2018-11-11 MED ORDER — PROPOFOL 10 MG/ML IV BOLUS
INTRAVENOUS | Status: DC | PRN
  Administered 2018-11-11: 130 mg via INTRAVENOUS
  Administered 2018-11-11: 20 mg via INTRAVENOUS

## 2018-11-11 MED ORDER — OXYCODONE HCL 5 MG/5ML PO SOLN: 5.0000 mg | Freq: Once | ORAL | Status: DC | PRN

## 2018-11-11 MED ORDER — ONDANSETRON HCL 4 MG/2ML IJ SOLN
INTRAMUSCULAR | Status: DC | PRN
  Administered 2018-11-11: 4 mg via INTRAVENOUS

## 2018-11-11 MED ORDER — FENTANYL CITRATE (PF) 100 MCG/2ML IJ SOLN: 25.0000 ug | INTRAMUSCULAR | Status: DC | PRN

## 2018-11-11 MED ORDER — ONDANSETRON HCL 4 MG/2ML IJ SOLN: 4.0000 mg | Freq: Once | INTRAMUSCULAR | Status: DC | PRN

## 2018-11-11 MED ORDER — LACTATED RINGERS IV SOLN
INTRAVENOUS | Status: DC | PRN
  Administered 2018-11-11: 07:00:00 via INTRAVENOUS

## 2018-11-11 MED ORDER — OXYCODONE HCL 5 MG PO TABS: 5.0000 mg | ORAL_TABLET | Freq: Once | ORAL | Status: DC | PRN

## 2018-11-11 MED ORDER — SUCCINYLCHOLINE CHLORIDE 200 MG/10ML IV SOSY
PREFILLED_SYRINGE | INTRAVENOUS | Status: AC
  Filled 2018-11-11: qty 10

## 2018-11-11 MED ORDER — MIDAZOLAM HCL 2 MG/2ML IJ SOLN
INTRAMUSCULAR | Status: AC
  Filled 2018-11-11: qty 2

## 2018-11-11 SURGICAL SUPPLY — 45 items
ADAPTER BRONCHOSCOPE OLYMPUS (ADAPTER) ×3 IMPLANT
ADAPTER VALVE BIOPSY EBUS (MISCELLANEOUS) IMPLANT
ADPTR VALVE BIOPSY EBUS (MISCELLANEOUS)
BRUSH BIOPSY BRONCH 10 SDTNB (MISCELLANEOUS) IMPLANT
BRUSH BIOPSY BRONCH 10MM SDTNB (MISCELLANEOUS)
BRUSH SUPERTRAX BIOPSY (INSTRUMENTS) IMPLANT
BRUSH SUPERTRAX NDL-TIP CYTO (INSTRUMENTS) ×6 IMPLANT
CANISTER SUCT 3000ML PPV (MISCELLANEOUS) ×3 IMPLANT
CHANNEL WORK EXTEND EDGE 180 (KITS) IMPLANT
CHANNEL WORK EXTEND EDGE 90 (KITS) IMPLANT
CONT SPEC 4OZ CLIKSEAL STRL BL (MISCELLANEOUS) ×6 IMPLANT
COVER BACK TABLE 60X90IN (DRAPES) ×3 IMPLANT
COVER WAND RF STERILE (DRAPES) IMPLANT
FILTER STRAW FLUID ASPIR (MISCELLANEOUS) ×3 IMPLANT
FORCEPS BIOP SUPERTRX PREMAR (INSTRUMENTS) ×3 IMPLANT
GAUZE SPONGE 4X4 12PLY STRL (GAUZE/BANDAGES/DRESSINGS) ×3 IMPLANT
GLOVE SURG SIGNA 7.5 PF LTX (GLOVE) ×3 IMPLANT
GOWN STRL REUS W/ TWL XL LVL3 (GOWN DISPOSABLE) ×2 IMPLANT
GOWN STRL REUS W/TWL XL LVL3 (GOWN DISPOSABLE) ×4
KIT CLEAN ENDO COMPLIANCE (KITS) ×3 IMPLANT
KIT MARKER FIDUCIAL DELIVERY (KITS) ×3 IMPLANT
KIT PROCEDURE EDGE 180 (KITS) ×3 IMPLANT
KIT PROCEDURE EDGE 90 (KITS) IMPLANT
KIT TURNOVER KIT B (KITS) ×3 IMPLANT
MARKER FIDUCIAL SL NIT COIL (Implant Marker) ×9 IMPLANT
MARKER SKIN DUAL TIP RULER LAB (MISCELLANEOUS) ×3 IMPLANT
NEEDLE SUPERTRX PREMARK BIOPSY (NEEDLE) ×3 IMPLANT
NS IRRIG 1000ML POUR BTL (IV SOLUTION) ×3 IMPLANT
OIL SILICONE PENTAX (PARTS (SERVICE/REPAIRS)) ×3 IMPLANT
PAD ARMBOARD 7.5X6 YLW CONV (MISCELLANEOUS) ×6 IMPLANT
PATCHES PATIENT (LABEL) ×9 IMPLANT
SYR 20CC LL (SYRINGE) ×3 IMPLANT
SYR 20ML ECCENTRIC (SYRINGE) ×3 IMPLANT
SYR 30ML LL (SYRINGE) ×3 IMPLANT
SYR 5ML LL (SYRINGE) ×3 IMPLANT
TOWEL GREEN STERILE (TOWEL DISPOSABLE) ×3 IMPLANT
TOWEL GREEN STERILE FF (TOWEL DISPOSABLE) ×3 IMPLANT
TRAP SPECIMEN MUCOUS 40CC (MISCELLANEOUS) ×3 IMPLANT
TUBE CONNECTING 20'X1/4 (TUBING) ×1
TUBE CONNECTING 20X1/4 (TUBING) ×2 IMPLANT
UNDERPAD 30X30 (UNDERPADS AND DIAPERS) ×3 IMPLANT
VALVE BIOPSY  SINGLE USE (MISCELLANEOUS) ×2
VALVE BIOPSY SINGLE USE (MISCELLANEOUS) ×1 IMPLANT
VALVE SUCTION BRONCHIO DISP (MISCELLANEOUS) ×3 IMPLANT
WATER STERILE IRR 1000ML POUR (IV SOLUTION) ×3 IMPLANT

## 2018-11-11 NOTE — Transfer of Care (Signed)
Immediate Anesthesia Transfer of Care Note  Patient: Lori Butler  Procedure(s) Performed: VIDEO BRONCHOSCOPY WITH ENDOBRONCHIAL NAVIGATION (N/A ) PLACEMENT OF FUDUCIAL (N/A )  Patient Location: PACU  Anesthesia Type:General  Level of Consciousness: awake, alert , oriented and patient cooperative  Airway & Oxygen Therapy: Patient Spontanous Breathing and Patient connected to nasal cannula oxygen  Post-op Assessment: Report given to RN and Post -op Vital signs reviewed and stable  Post vital signs: Reviewed and stable  Last Vitals:  Vitals Value Taken Time  BP 146/86 11/11/18 0918  Temp    Pulse 101 11/11/18 0918  Resp 25 11/11/18 0918  SpO2 95 % 11/11/18 0918  Vitals shown include unvalidated device data.  Last Pain:  Vitals:   11/11/18 0649  TempSrc:   PainSc: 0-No pain         Complications: No apparent anesthesia complications

## 2018-11-11 NOTE — Anesthesia Postprocedure Evaluation (Signed)
Anesthesia Post Note  Patient: Lori Butler  Procedure(s) Performed: VIDEO BRONCHOSCOPY WITH ENDOBRONCHIAL NAVIGATION (N/A ) PLACEMENT OF FUDUCIAL (N/A )     Patient location during evaluation: PACU Anesthesia Type: General Level of consciousness: awake and alert Pain management: pain level controlled Vital Signs Assessment: post-procedure vital signs reviewed and stable Respiratory status: spontaneous breathing, nonlabored ventilation, respiratory function stable and patient connected to nasal cannula oxygen Cardiovascular status: blood pressure returned to baseline and stable Postop Assessment: no apparent nausea or vomiting Anesthetic complications: no    Last Vitals:  Vitals:   11/11/18 0948 11/11/18 1004  BP: 140/77 133/63  Pulse: 96 (!) 104  Resp: 19 20  Temp: (!) 36.4 C   SpO2: 91% 91%    Last Pain:  Vitals:   11/11/18 1004  TempSrc:   PainSc: 0-No pain                 Advay Volante

## 2018-11-11 NOTE — Discharge Instructions (Signed)
Do not drive or engage in heavy physical activity for 24 hours  You may resume normal activities tomorrow  You may cough up small amounts of blood over the next few days  You may use acetaminophen(Tylenol) if needed for discomfort.   You may use an over the counter cough medication or throat lozenge as needed  Call [phone] if you develop chest pain, shortness of breath or cough up more than 2 tablespoons of blood  Follow up in the multidisciplinary thoracic oncology clinic on Thursday as scheduled

## 2018-11-11 NOTE — Interval H&P Note (Signed)
History and Physical Interval Note:  11/11/2018 7:15 AM  Lori Butler  has presented today for surgery, with the diagnosis of LUL NODULE.  The various methods of treatment have been discussed with the patient and family. After consideration of risks, benefits and other options for treatment, the patient has consented to  Procedure(s): [place] (N/A) PLACEMENT OF FUDUCIAL (N/A) as a surgical intervention.  The patient's history has been reviewed, patient examined, no change in status, stable for surgery.  I have reviewed the patient's chart and labs.  Questions were answered to the patient's satisfaction.     Melrose Nakayama

## 2018-11-11 NOTE — Brief Op Note (Signed)
11/11/2018  9:23 AM  PATIENT:  Lori Butler  68 y.o. female  PRE-OPERATIVE DIAGNOSIS:  Left Upper Lobe NODULE  POST-OPERATIVE DIAGNOSIS:  Left Upper Lobe NODULE  PROCEDURE:  Procedure(s): VIDEO BRONCHOSCOPY WITH ENDOBRONCHIAL NAVIGATION (N/A) PLACEMENT OF FUDUCIAL (N/A)  SURGEON:  Surgeon(s) and Role:    * Melrose Nakayama, MD - Primary    * Lightfoot, Lucile Crater, MD - Assisting  PHYSICIAN ASSISTANT:   ASSISTANTS: none   ANESTHESIA:   general  EBL:  minimal   BLOOD ADMINISTERED:none  DRAINS: none   LOCAL MEDICATIONS USED:  NONE  SPECIMEN:  Source of Specimen:  LUL nodule  DISPOSITION OF SPECIMEN:  PATHOLOGY  COUNTS:  NO endoscopic  TOURNIQUET:  * No tourniquets in log *  DICTATION: .Other Dictation: Dictation Number -  PLAN OF CARE: Discharge to home after PACU  PATIENT DISPOSITION:  PACU - hemodynamically stable.   Delay start of Pharmacological VTE agent (>24hrs) due to surgical blood loss or risk of bleeding: not applicable

## 2018-11-11 NOTE — Patient Outreach (Signed)
[facility]) Care Management  11/11/2018  Lori Butler 09-18-1950 [id number]    RN Health Coach notified that the patient has been admitted to the hospital 11/11/2018 for Nodule or upper lobe.  RN Health Coach will notify Hospital Liaison of admission.  Plan:  RN Health Coach will close the program due to admission.  Lazaro Arms RN, BSN, [place] Direct Dial:  [phone]  Fax: [phone]

## 2018-11-11 NOTE — Anesthesia Procedure Notes (Signed)
Procedure Name: Intubation Date/Time: 11/11/2018 7:41 AM Performed by: Shirlyn Goltz, CRNA Pre-anesthesia Checklist: Patient identified, Emergency Drugs available and Suction available Patient Re-evaluated:Patient Re-evaluated prior to induction Oxygen Delivery Method: Circle system utilized Preoxygenation: Pre-oxygenation with 100% oxygen Induction Type: IV induction Ventilation: Mask ventilation without difficulty Laryngoscope Size: Mac and 4 Grade View: Grade I Tube type: Oral Tube size: 8.5 mm Airway Equipment and Method: Stylet Placement Confirmation: ETT inserted through vocal cords under direct vision,  positive ETCO2 and breath sounds checked- equal and bilateral Secured at: 22 cm Tube secured with: Tape Dental Injury: Teeth and Oropharynx as per pre-operative assessment

## 2018-11-12 ENCOUNTER — Encounter (HOSPITAL_COMMUNITY): Payer: Self-pay | Admitting: Thoracic Surgery (Cardiothoracic Vascular Surgery)

## 2018-11-12 NOTE — Op Note (Signed)
NAMEMORGAN, KEINATH MEDICAL RECORD [id number] ACCOUNT [id number] DATE OF BIRTH:17-Jan-1951 FACILITY: [facility] LOCATION: [facility] PHYSICIAN:Nefertiti Mohamad C. Ellisha Bankson, MD  OPERATIVE REPORT  DATE OF PROCEDURE:  11/11/2018  PREOPERATIVE DIAGNOSIS:  Left upper lobe lung nodule.  POSTOPERATIVE DIAGNOSIS:  Left upper lobe lung nodule.  PROCEDURE:  Electromagnetic navigational bronchoscopy with needle aspirations, brushings, transbronchial biopsies and fiducial placement.  SURGEON:  Modesto Charon, MD  ASSISTANT:  Melodie Bouillon, MD  ANESTHESIA:  General.  FINDINGS:  Atypical cells noted on needle aspirations.  Final diagnosis deferred to final pathology.  CLINICAL NOTE:  Mrs. Emigh is a 68 year old woman with a history of tobacco abuse who was found to have a 13 mm left upper lobe nodule on a low-dose screening CT for lung cancer.  A PET CT showed the nodule was hypermetabolic with an SUV of 8. Although  she was felt to be a potential candidate for surgical resection with a sublobar resection, she was not interested in pursuing surgery and wished to consider stereotactic radiation.  The patient was advised to undergo navigational bronchoscopy for biopsy  and fiducial placement.  The indications, risks, benefits, and alternatives were discussed in detail with the patient.  She understood and accepted the risks and agreed to proceed.  DESCRIPTION OF PROCEDURE:  The patient was brought to the operating room on 11/11/2018.  She had induction of general anesthesia and was intubated.  Sequential compression devices were placed on the calves for DVT prophylaxis.  A timeout was performed.   Flexible fiberoptic bronchoscopy was performed via the endotracheal tube.  It revealed normal endobronchial anatomy with no endobronchial lesions to the level of the subsegmental bronchi.  The locatable guide for navigation was placed.  Mapping of the lesion had been performed prior to induction.  The  locatable guide was placed and registration was performed.  There was good correlation of the video and virtual bronchoscopy.  The  bronchoscope then was advanced to the left upper lobe origin and the appropriate subsegmental bronchus was cannulated with the locatable guide, which was advanced to within a centimeter of the center of the lesion with good alignment.  Local registration  was performed, but it was difficult to see the nodule clearly on fluoroscopy due to the small size of the nodule.  Sampling then was performed.  All sampling was done under fluoroscopy as was the fiducial placement.  Needle aspirations were  performed followed by needle brushings, 3 of each of those was done.  The locatable guide was reinserted after every third to fourth sampling to ensure continued good alignment and proximity with the nodule.  Multiple biopsies were obtained.  All the  biopsies were sent for permanent pathology.  Quick prep showed some atypical cells, but no definite tumor.  The catheter then was repositioned to the site chosen on the local registration and the sampling process was repeated there.  This was felt to be  less likely the source then the original site. Quick preps from that region showed no tumor either.  The bronchoscope was readvanced to the original site.  Additional brushings were performed, which again showed atypical cells.  Multiple additional  biopsies were obtained.  There was some bleeding with the biopsies.  Registration was performed for fiducial placements and 3 fiducials were placed at sites as selected by the computer.  The sheath then was removed.  There was some mild bleeding from the  left upper lobe bronchus, which cleared with irrigation with saline.  The patient then was extubated  in the operating room and taken to the [facility] in good condition.  TN/NUANCE  D:11/12/2018 T:11/12/2018 JOB:[id number]

## 2018-11-13 ENCOUNTER — Ambulatory Visit: Payer: Self-pay

## 2018-11-14 ENCOUNTER — Other Ambulatory Visit: Payer: Self-pay

## 2018-11-14 ENCOUNTER — Ambulatory Visit
Admit: 2018-11-14 | Discharge: 2018-11-14 | Disposition: A | Discharge status: Home / Self Care | Payer: Medicare HMO | Attending: Radiation Oncology | Admitting: Radiation Oncology

## 2018-11-14 ENCOUNTER — Other Ambulatory Visit: Payer: Self-pay | Admitting: *Deleted

## 2018-11-14 DIAGNOSIS — J449 Chronic obstructive pulmonary disease, unspecified: Secondary | ICD-10-CM | POA: Diagnosis not present

## 2018-11-14 DIAGNOSIS — Z87891 Personal history of nicotine dependence: Secondary | ICD-10-CM | POA: Diagnosis not present

## 2018-11-14 DIAGNOSIS — C3412 Malignant neoplasm of upper lobe, left bronchus or lung: Secondary | ICD-10-CM

## 2018-11-14 DIAGNOSIS — J45909 Unspecified asthma, uncomplicated: Secondary | ICD-10-CM | POA: Diagnosis not present

## 2018-11-14 DIAGNOSIS — D491 Neoplasm of unspecified behavior of respiratory system: Secondary | ICD-10-CM | POA: Diagnosis not present

## 2018-11-14 NOTE — Progress Notes (Addendum)
Radiation Oncology         [phone] ________________________________  Name: Lori Butler        MRN: [id number]  Date of Service: 11/14/2018 DOB: 05-Sep-1950  VQ:[id number], Lattie Haw, MD  Melrose Nakayama, *     REFERRING PHYSICIAN: Melrose Nakayama, *   DIAGNOSIS: The encounter diagnosis was Malignant neoplasm of upper lobe of left lung ([place]).   HISTORY OF PRESENT ILLNESS: Lori Butler is a 68 y.o. female seen at the request of Dr. Roxan Hockey for a putative stage I lung cancer. The patient has a history of smoking and quit over a year ago but has about a 75 pack year history. She has COPD and Asthma and was sent for lung cancer screening scan on  10/14/2018 which revealed a 1.26 cm lesion in the left upper lobe, as well as a 3.6 mm lesion in the left lower lobe. No adenopathy was noted. She has a low attenuation lesion in the liver measuring 1.3 cm  And this was felt to be stable in comparison to prior studies. She underwent a PET scan on 10/18/2018 that revealed an SUV of 8.4 in the left upper lobe lesion that was seen on Ct and measured 1.3 cm on PET. She did not have additional hypermetabolic changes in the other nodule or in thoracic nodes. The liver did not show metabolic change and was favored as a cyst. She did undergo bronchoscopy on 11/11/2018 which revealed atypical cells in her biopsy and cytology specimens without definitive carcinoma. She is seen today to discuss options of radiotherapy, as she's already decided against surgical resection.   PREVIOUS RADIATION THERAPY: No   PAST MEDICAL HISTORY:  Past Medical History:  Diagnosis Date  . Acute respiratory failure with hypoxia ([place])   . Anxiety   . Aortic atherosclerosis ([place])   . Arthritis    "a little in my hands probably" (10/31/2017)  . COPD (chronic obstructive pulmonary disease) ([place])   . COPD exacerbation ([place])   . Depression   . History of kidney stones   . Suicide attempt ([place]) 02/21/2018   overdosed on  Wellbutrin       PAST SURGICAL HISTORY: Past Surgical History:  Procedure Laterality Date  . ABDOMINAL HYSTERECTOMY    . APPENDECTOMY    . BREAST BIOPSY Left 1990s  . CATARACT EXTRACTION W/ INTRAOCULAR LENS  IMPLANT, BILATERAL Bilateral   . DILATION AND CURETTAGE OF UTERUS    . FUDUCIAL PLACEMENT N/A 11/11/2018   Procedure: PLACEMENT OF FUDUCIAL;  Surgeon: Melrose Nakayama, MD;  Location: [place];  Service: Thoracic;  Laterality: N/A;  . TONSILLECTOMY    . TUBAL LIGATION    . VIDEO BRONCHOSCOPY WITH ENDOBRONCHIAL NAVIGATION N/A 11/11/2018   Procedure: VIDEO BRONCHOSCOPY WITH ENDOBRONCHIAL NAVIGATION;  Surgeon: Melrose Nakayama, MD;  Location: [facility] OR;  Service: Thoracic;  Laterality: N/A;     FAMILY HISTORY:  Family History  Problem Relation Age of Onset  . Breast cancer Mother      SOCIAL HISTORY:  reports that she quit smoking about 14 months ago. Her smoking use included cigarettes. She has a 76.50 pack-year smoking history. She has never used smokeless tobacco. She reports previous alcohol use. She reports that she does not use drugs. The patient is divorced and lives in [place]. Her son [name] joined [place] on speaker phone.    ALLERGIES: Demerol [meperidine], Oxycodone-acetaminophen, and Tramadol   MEDICATIONS:  Current Outpatient Medications  Medication Sig Dispense Refill  . albuterol (PROVENTIL  HFA;VENTOLIN HFA) 108 (90 Base) MCG/ACT inhaler Inhale 2 puffs into the lungs every 6 (six) hours as needed for wheezing or shortness of breath. 1 Inhaler 2  . albuterol (PROVENTIL) (2.5 MG/3ML) 0.083% nebulizer solution Take 3 mLs (2.5 mg total) by nebulization every 6 (six) hours as needed for wheezing or shortness of breath. 75 mL 6  . alendronate (FOSAMAX) 70 MG tablet Take 70 mg by mouth every Wednesday.     . budesonide-formoterol (SYMBICORT) 160-4.5 MCG/ACT inhaler Inhale 2 puffs into the lungs 2 (two) times daily. 1 Inhaler 0  . Cholecalciferol (VITAMIN D) 50 MCG (2000  UT) tablet Take 2,000 Units by mouth daily.    Marland Kitchen guaiFENesin (MUCINEX) 600 MG 12 hr tablet Take 600 mg by mouth 2 (two) times daily.     . montelukast (SINGULAIR) 10 MG tablet Take 1 tablet (10 mg total) by mouth at bedtime. 30 tablet 1  . pantoprazole (PROTONIX) 40 MG tablet Take 1 tablet (40 mg total) by mouth 2 (two) times daily. 60 tablet 1  . predniSONE (DELTASONE) 10 MG tablet 1/2 tab x 4 weeks then stop 15 tablet 0  . sertraline (ZOLOFT) 50 MG tablet Take 1 tablet (50 mg total) by mouth daily. 30 tablet 0  . tiotropium (SPIRIVA HANDIHALER) 18 MCG inhalation capsule Place 1 capsule (18 mcg total) into inhaler and inhale daily. 30 capsule 11   No current facility-administered medications for this encounter.      REVIEW OF SYSTEMS: On review of systems, the patient reports that she is doing well overall. She reports occasional tightness with breathing when she's outside in the heat and humidity. She did see a small amount of bleed after coughing the last day or two since her bronchoscopy. She denies any chest pain, shortness of breath, cough, fevers, chills, night sweats, unintended weight changes otherwise. She denies any bowel or bladder disturbances, and denies abdominal pain, nausea or vomiting. She denies any new musculoskeletal or joint aches or pains. A complete review of systems is obtained and is otherwise negative.     PHYSICAL EXAM:  Wt Readings from Last 3 Encounters:  11/14/18 177 lb 12.8 oz (80.6 kg)  11/07/18 178 lb 6.4 oz (80.9 kg)  11/05/18 (P) 178 lb (80.7 kg)   Temp Readings from Last 3 Encounters:  11/14/18 97.8 F (36.6 C) (Oral)  11/11/18 (!) 97.5 F (36.4 C)  11/07/18 98.3 F (36.8 C) (Oral)   BP Readings from Last 3 Encounters:  11/14/18 (!) 144/68  11/11/18 133/63  11/07/18 120/70   Pulse Readings from Last 3 Encounters:  11/14/18 94  11/11/18 (!) 104  11/07/18 88     In general this is a well appearing caucasian female in no acute distress. She's  alert and oriented x4 and appropriate throughout the examination. Cardiopulmonary assessment is negative for acute distress and she exhibits normal effort.     ECOG = 1  0 - Asymptomatic (Fully active, able to carry on all predisease activities without restriction)  1 - Symptomatic but completely ambulatory (Restricted in physically strenuous activity but ambulatory and able to carry out work of a light or sedentary nature. For example, light housework, office work)  2 - Symptomatic, <50% in bed during the day (Ambulatory and capable of all self care but unable to carry out any work activities. Up and about more than 50% of waking hours)  3 - Symptomatic, >50% in bed, but not bedbound (Capable of only limited self-care, confined to bed or  chair 50% or more of waking hours)  4 - Bedbound (Completely disabled. Cannot carry on any self-care. Totally confined to bed or chair)  5 - Death   Eustace Pen MM, Creech RH, Tormey DC, et al. [id number]). "Toxicity and response criteria of the [facility]". [place] Oncol. 5 (6): 649-55    LABORATORY DATA:  Lab Results  Component Value Date   WBC 11.7 (H) 11/05/2018   HGB 12.9 11/05/2018   HCT 40.6 11/05/2018   MCV 92.5 11/05/2018   PLT 335 11/05/2018   Lab Results  Component Value Date   NA 138 11/05/2018   K 4.0 11/05/2018   CL 105 11/05/2018   CO2 22 11/05/2018   Lab Results  Component Value Date   ALT 26 11/05/2018   AST 26 11/05/2018   ALKPHOS 76 11/05/2018   BILITOT 0.9 11/05/2018      RADIOGRAPHY: Dg Chest 2 View  Result Date: 11/11/2018 CLINICAL DATA:  Preoperative evaluation. EXAM: CHEST - 2 VIEW COMPARISON:  PET-CT 10/18/2018. Chest CT 10/14/2018. Chest x-ray 09/16/2018. FINDINGS: Mediastinum hilar structures are stable. Heart size stable. Spiculated density in the left upper lung best identified by prior PET-CT. Biapical pleural thickening consistent scarring. No acute infiltrate. No pleural effusion or  pneumothorax. No acute bony abnormality. IMPRESSION: Spiculated density noted the left upper lung best identified by prior PET-CT. No acute pulmonary disease noted. Electronically Signed   By: Marcello Moores  Register   On: 11/11/2018 07:01   Nm Pet Image Initial (pi) Skull Base To Thigh  Result Date: 10/18/2018 CLINICAL DATA:  Initial treatment strategy for abnormal lung cancer screening exam demonstrating a spiculated left apical pulmonary nodule. EXAM: NUCLEAR MEDICINE PET SKULL BASE TO THIGH TECHNIQUE: 8.8 mCi F-18 FDG was injected intravenously. Full-ring PET imaging was performed from the skull base to thigh after the radiotracer. CT data was obtained and used for attenuation correction and anatomic localization. Fasting blood glucose: 70 mg/dl COMPARISON:  Chest CT 10/14/2018. FINDINGS: Mediastinal blood pool activity: SUV max 2.8 Liver activity: SUV max NA NECK: Extensive muscular activity throughout the neck and upper chest. Given this limitation, No abnormal cervical hypermetabolism. Incidental CT findings: No cervical adenopathy. CHEST: Hypermetabolism corresponding to the left upper lobe spiculated pulmonary nodule. Example at 1.3 cm and a S.U.V. max of 8.4 on image 18/8. No thoracic nodal hypermetabolism. Incidental CT findings: Deferred to recent diagnostic CT. Aortic and coronary artery atherosclerosis. Centrilobular emphysema with biapical pleuroparenchymal scarring. ABDOMEN/PELVIS: No abdominopelvic parenchymal or nodal hypermetabolism. Incidental CT findings: Normal adrenal glands. Abdominal aortic atherosclerosis. Segment 4a low-density liver lesion is likely a cyst at 1.1 cm. Hysterectomy. SKELETON: No abnormal marrow activity. Incidental CT findings: none IMPRESSION: 1. Left upper lobe hypermetabolic spiculated pulmonary nodule, consistent with primary bronchogenic carcinoma. No hypermetabolic thoracic nodal or extrathoracic metastasis identified. Presuming non-small-cell histology, T1bN0M0 or stage  IA. 2. Extensive muscular activity throughout the neck and chest, mildly limiting evaluation. 3. Aortic atherosclerosis (ICD10-I70.0), coronary artery atherosclerosis and emphysema (ICD10-J43.9). Electronically Signed   By: Abigail Miyamoto M.D.   On: 10/18/2018 16:25   Dg C-arm Bronchoscopy  Result Date: 11/11/2018 C-ARM BRONCHOSCOPY: Fluoroscopy was utilized by the requesting physician.  No radiographic interpretation.       IMPRESSION/PLAN: 1. Putative Stage IA2, cT1bN0M0 NSCLC of the LUL. Dr. Lisbeth Renshaw discusses the pathology findings and reviews the nature of early stage lung cancer. He discusses the scenario for when biopsies are suspicious, but do not definitively diagnose a carcinoma. He reviews that her  data points including CT imaging and PET findings however do appear to confirm our clinical suspicion of her LUL lesion being malignant. She appears to be early stage and would benefit from either surgical resection, stereotactic radiotherapy (SBRT), or to watch and wait.  We discussed the risks, benefits, short, and long term effects of radiotherapy, and the patient is interested in proceeding. Dr. Lisbeth Renshaw discusses the delivery and logistics of radiotherapy and anticipates a course of 3-5 fractions of radiotherapy. Written consent is obtained and placed in the chart, a copy was provided to the patient. She will come to our office on 11/20/2018 for simulation.  2. LLL nodule. We will follow this expectantly on post treatment imaging. The patient is counseled on the risks of encountering other nodules that may need treatment in the future because of #1.  3. COPD/Asthma. The patient was encouraged to continue to follow up with pulmonary medicine, with her NP and Dr. Elsworth Soho who are following her. We appreciate their help in maximizing her lung function.   In a visit lasting 45 minutes, greater than 50% of the time was spent face to face discussing her case, and coordinating the patient's care.   The above  documentation reflects my direct findings during this shared patient visit. Please see the separate note by Dr. Lisbeth Renshaw on this date for the remainder of the patient's plan of care.    Carola Rhine, PAC

## 2018-11-14 NOTE — Progress Notes (Signed)
The purposed treatment discussion in cancer conference 11/14/2018 is for discussion purpose only and is not a binding recommendation.  The patient was not physically examined nor present for their treatment options.  Therefore, final treatment plan cannot be decided.

## 2018-11-14 NOTE — Addendum Note (Signed)
Encounter addended by: Jesusita Oka, RN on: 11/14/2018 3:50 PM  Actions taken: Actions taken from a BestPractice Advisory, Order list changed, Diagnosis association updated

## 2018-11-18 ENCOUNTER — Encounter: Payer: Self-pay | Admitting: *Deleted

## 2018-11-18 NOTE — Progress Notes (Signed)
Oncology Nurse Navigator Documentation  Oncology Nurse Navigator Flowsheets 11/18/2018  Navigator Location [facility]  Referral Date to RadOnc/MedOnc -  Navigator Encounter Type Other/I followed up on Ms. Rippeon schedule with Rad Onc. Per Bryson Ha Perkin's note, patient needs to be scheduled for [facility] on 7/15.  I updated Rad Onc scheduling to complete.   Telephone -  Treatment Phase -  Barriers/Navigation Needs Coordination of Care  Education -  Interventions Coordination of Care  Coordination of Care Other  Education Method -  Acuity Level 1  Time Spent with Patient 30

## 2018-11-20 ENCOUNTER — Ambulatory Visit
Admission: RE | Admit: 2018-11-20 | Discharge: 2018-11-20 | Disposition: A | Discharge status: Home / Self Care | Payer: Medicare HMO | Source: Ambulatory Visit | Attending: Radiation Oncology | Admitting: Radiation Oncology

## 2018-11-20 ENCOUNTER — Other Ambulatory Visit: Payer: Self-pay

## 2018-11-20 ENCOUNTER — Ambulatory Visit: Payer: Medicare HMO | Admitting: Radiation Oncology

## 2018-11-20 DIAGNOSIS — Z510 Encounter for antineoplastic radiation therapy: Secondary | ICD-10-CM | POA: Diagnosis not present

## 2018-11-20 DIAGNOSIS — C3412 Malignant neoplasm of upper lobe, left bronchus or lung: Secondary | ICD-10-CM | POA: Diagnosis not present

## 2018-11-20 DIAGNOSIS — Z87891 Personal history of nicotine dependence: Secondary | ICD-10-CM | POA: Diagnosis not present

## 2018-11-21 ENCOUNTER — Encounter: Payer: Self-pay | Admitting: *Deleted

## 2018-11-21 DIAGNOSIS — Z510 Encounter for antineoplastic radiation therapy: Secondary | ICD-10-CM | POA: Diagnosis not present

## 2018-11-21 DIAGNOSIS — C3412 Malignant neoplasm of upper lobe, left bronchus or lung: Secondary | ICD-10-CM | POA: Diagnosis not present

## 2018-11-21 DIAGNOSIS — Z87891 Personal history of nicotine dependence: Secondary | ICD-10-CM | POA: Diagnosis not present

## 2018-11-21 NOTE — Progress Notes (Signed)
Oncology Nurse Navigator Documentation  Oncology Nurse Navigator Flowsheets 11/21/2018  Navigator Location [facility]  Referral Date to RadOnc/MedOnc -  Navigator Encounter Type Other/I followed up on Lori Butler's treatment plan.  She is set up for her treatment plan.   Telephone -  Abnormal Finding Date 10/14/2018  Confirmed Diagnosis Date 11/11/2018  Multidisiplinary Clinic Date 11/14/2018  Treatment Initiated Date 11/20/2018  Patient Visit Type MedOnc  Treatment Phase Treatment  Barriers/Navigation Needs Coordination of Care  Education -  Interventions Coordination of Care  Coordination of Care Other  Education Method -  Acuity Level 1  Time Spent with Patient 15

## 2018-11-26 ENCOUNTER — Telehealth: Payer: Self-pay | Admitting: Pulmonary Disease

## 2018-11-26 ENCOUNTER — Encounter: Payer: Self-pay | Admitting: Adult Health

## 2018-11-26 ENCOUNTER — Telehealth (INDEPENDENT_AMBULATORY_CARE_PROVIDER_SITE_OTHER): Payer: Medicare HMO | Admitting: Adult Health

## 2018-11-26 DIAGNOSIS — R911 Solitary pulmonary nodule: Secondary | ICD-10-CM | POA: Diagnosis not present

## 2018-11-26 DIAGNOSIS — J441 Chronic obstructive pulmonary disease with (acute) exacerbation: Secondary | ICD-10-CM | POA: Diagnosis not present

## 2018-11-26 MED ORDER — PREDNISONE 5 MG PO TABS: 5.0000 mg | ORAL_TABLET | Freq: Every day | ORAL | 3 refills | Status: DC

## 2018-11-26 MED ORDER — PREDNISONE 10 MG PO TABS: ORAL_TABLET | ORAL | 0 refills | Status: DC

## 2018-11-26 NOTE — Progress Notes (Signed)
Virtual Visit via Video Note  I connected with Lori Butler on 11/26/18 at  1:30 PM EDT by a video enabled telemedicine application and verified that I am speaking with the correct person using two identifiers.  Location: Patient: Home  Provider: Office    I discussed the limitations of evaluation and management by telemedicine and the availability of in person appointments. The patient expressed understanding and agreed to proceed.  History of Present Illness: 68 year old female former smoker (quit May 2019-74-pack-year history) followed for COPD and lung cancer (diagnosed July 2020)  Medical history significant for anxiety depression attempted suicide   Patient was found to have a left upper lobe nodule on CT screening.  PET scan showed a hypermetabolic nodule.  Patient was sent to thoracic surgery.  Was recommended for resection however patient declined. Would like to proceed with stereotactic therapy.  she underwent an ENB with fiducial placement on 11/11/2018.  Pathology and cytology was nondiagnostic with atypical cells no malignant cells noted.  Her case [facility] .   Felt that patient could continue with observation versus stereotactic therapy.  Patient does not wish to wait and has wanted to proceed with stereotactic therapy despite definitive carcinoma. Begin XRT 12/03/18.   Today's tele-visit is for an acute office visit.  Patient complains of the last 3 days that she has had increased cough, thick mucus -clear to white , wheezing , and dyspnea. Was doing well since lung biopsy until yesterday . Has been tapering prednisone, currently on prednisone 5mg  daily .  Started albuterol nebs which have helped some. No fever. No discolored mucus. No increased leg swelling, swelling is better than usual. No orthopnea. No chest pain or pleuritic pain.  Currently on prednisone 5mg  daily . No n/v.d. Appetite okay .      Observations/Objective: Low-dose CT chest June 2020  showed a suspicious lung nodule in the left upper lobe.  PET scan June 2020+ left upper lobe hypermetabolic pulmonary nodule ENB  11/11/2018  Assessment and Plan: Acute COPD exacerbation-prone to frequent flareups. She has no discolored mucus.  Has no pleuritic pain.  Symptoms seem to be worsening as she decreases her steroids. We will give her a slight prednisone burst.  Increase her pulmonary hygiene with Mucinex and albuterol nebs as needed.  Taper her prednisone and hold at 5 mg daily until seen back in the office.  Advised that if symptoms worsen or she develops pleuritic pain she is to contact our office immediately or go to the emergency room.  Hypermetabolic left upper lobe nodule presumed stage I lung cancer.  ENB on 11/11/2018 was nondiagnostic.  However highly suspicious for underlying malignancy.  Patient wishes to proceed with stereotactic therapy.  This is planned for July 28 (for 3 treatments)  Plan  Patient Instructions  Increase Prednisone 20mg  daily for 5 days , then 10mg  daily for 5 days then 5mg  daily -hold at this dose until seen back in office  Mucinex DM Twice daily  As needed  Cough /congestion  Continue on Spiriva 1 puff daily .  Continue on Symbicort 2 puffs Twice daily   Albuterol Neb every 4-6 hr As needed  Wheezing /shortness of breath .  Follow up in office with Dr. Elsworth Soho  Or Murrell Dome NP in 3 weeks and .As needed  With chest xray  Please contact office for sooner follow up if symptoms do not improve or worsen or seek emergency care        Follow Up  Instructions:   Follow-up in the office in 3 weeks and as needed   I discussed the assessment and treatment plan with the patient. The patient was provided an opportunity to ask questions and all were answered. The patient agreed with the plan and demonstrated an understanding of the instructions.   The patient was advised to call back or seek an in-person evaluation if the symptoms worsen or if the condition fails  to improve as anticipated.  I provided 30 minutes of non-face-to-face time during this encounter.   [name], NP

## 2018-11-26 NOTE — Patient Instructions (Addendum)
Increase Prednisone 20mg  daily for 5 days , then 10mg  daily for 5 days then 5mg  daily -hold at this dose until seen back in office  Mucinex DM Twice daily  As needed  Cough /congestion  Continue on Spiriva 1 puff daily .  Continue on Symbicort 2 puffs Twice daily   Albuterol Neb every 4-6 hr As needed  Wheezing /shortness of breath .  Follow up in office with Dr. Elsworth Soho  Or Courteny Egler NP in 3 weeks and .As needed  With chest xray  Please contact office for sooner follow up if symptoms do not improve or worsen or seek emergency care

## 2018-11-26 NOTE — Telephone Encounter (Signed)
Primary Pulmonologist: RA Last office visit and with whom: 11/07/18 with EW What do we see them for (pulmonary problems): lung nodule Last OV assessment/plan:  COPD (chronic obstructive pulmonary disease) ([facility]) - COPD/asthma overlap  - Improved with 10mg  prednisone qd x 6 weeks - Plan taper 5mg  daily x 4 weeks and then stop  - Continue Symbicort 160 2bid and Spiriva qd  Solitary pulmonary nodule on lung CT - LDCT showed lung RADS 4x - PET scan positive LUL hypermetabolic spiculated pulmonary nodule - Referred to CT surgery for second opinion. Seen by Dr. Roxan Hockey on 10/29/18 - Patient is not interested in surgery and is strongly interested in radiation therapy - Plan navigation bronchoscopy for bx and fiducial placement on Monday 11/11/18  FU in 1 month with Dr. Candise Che, NP 11/07/2018  Was appointment offered to patient (explain)?  No, patient wanted recommendations first.    Reason for call: Spoke with patient. She stated that she was seen by [facility] on 11/07/18 and was advised to decrease her Prednisone to 5mg  daily. She was doing very well and was able to proceed with her lung biopsy on 11/11/18 and stimulation on the same week. Her cough and chest tightness returned about 3 days ago 11/23/18 and has gotten worse. She has a productive cough with thick white phlegm. Denied any fevers. Slight increase in SOB. Also denied any body aches.   She has been using her Symbicort and albuterol nebs with no relief.   She is scheduled to start radiation on 12/03/18.   She wants to know if she could proceed with radiation and if she can have something called in for her. Pharmacy is [facility] on [address].   TP, please advise since neither Beth or RA are in office today. Thanks!

## 2018-11-26 NOTE — Telephone Encounter (Signed)
Patient has been scheduled for a video visit today at 130.

## 2018-11-26 NOTE — Addendum Note (Signed)
Addended by: Parke Poisson E on: 11/26/2018 02:59 PM   Modules accepted: Orders

## 2018-11-26 NOTE — Telephone Encounter (Signed)
Can set up for videovisit with APP today  Please contact office for sooner follow up if symptoms do not improve or worsen or seek emergency care

## 2018-12-03 ENCOUNTER — Other Ambulatory Visit: Payer: Self-pay

## 2018-12-03 ENCOUNTER — Ambulatory Visit
Admission: RE | Admit: 2018-12-03 | Discharge: 2018-12-03 | Disposition: A | Discharge status: Home / Self Care | Payer: Medicare HMO | Source: Ambulatory Visit | Attending: Radiation Oncology | Admitting: Radiation Oncology

## 2018-12-03 DIAGNOSIS — Z510 Encounter for antineoplastic radiation therapy: Secondary | ICD-10-CM | POA: Diagnosis not present

## 2018-12-03 DIAGNOSIS — Z87891 Personal history of nicotine dependence: Secondary | ICD-10-CM | POA: Diagnosis not present

## 2018-12-03 DIAGNOSIS — C3412 Malignant neoplasm of upper lobe, left bronchus or lung: Secondary | ICD-10-CM | POA: Diagnosis not present

## 2018-12-05 ENCOUNTER — Other Ambulatory Visit: Payer: Self-pay

## 2018-12-05 ENCOUNTER — Ambulatory Visit
Admission: RE | Admit: 2018-12-05 | Discharge: 2018-12-05 | Disposition: A | Discharge status: Home / Self Care | Payer: Medicare HMO | Source: Ambulatory Visit | Attending: Radiation Oncology | Admitting: Radiation Oncology

## 2018-12-05 DIAGNOSIS — C3412 Malignant neoplasm of upper lobe, left bronchus or lung: Secondary | ICD-10-CM | POA: Diagnosis not present

## 2018-12-05 DIAGNOSIS — Z510 Encounter for antineoplastic radiation therapy: Secondary | ICD-10-CM | POA: Diagnosis not present

## 2018-12-05 DIAGNOSIS — Z87891 Personal history of nicotine dependence: Secondary | ICD-10-CM | POA: Diagnosis not present

## 2018-12-10 ENCOUNTER — Encounter: Payer: Self-pay | Admitting: Radiation Oncology

## 2018-12-10 ENCOUNTER — Ambulatory Visit
Admission: RE | Admit: 2018-12-10 | Discharge: 2018-12-10 | Disposition: A | Discharge status: Home / Self Care | Payer: Medicare HMO | Source: Ambulatory Visit | Attending: Radiation Oncology | Admitting: Radiation Oncology

## 2018-12-10 ENCOUNTER — Other Ambulatory Visit: Payer: Self-pay

## 2018-12-10 DIAGNOSIS — C3412 Malignant neoplasm of upper lobe, left bronchus or lung: Secondary | ICD-10-CM | POA: Diagnosis not present

## 2018-12-10 DIAGNOSIS — Z510 Encounter for antineoplastic radiation therapy: Secondary | ICD-10-CM | POA: Insufficient documentation

## 2018-12-10 DIAGNOSIS — Z87891 Personal history of nicotine dependence: Secondary | ICD-10-CM | POA: Insufficient documentation

## 2018-12-10 LAB — BUN & CREATININE (CHCC)
BUN: 16 mg/dL (ref 8–23)
Creatinine: 0.86 mg/dL (ref 0.44–1.00)
GFR, Est AFR Am: >60 mL/min (ref >60)
GFR, Estimated: >60 mL/min (ref >60)

## 2018-12-10 NOTE — Progress Notes (Signed)
The patient was seen today following last radiation treatment.  In summary she was diagnosed with a stage I A2 putative non-small cell lung cancer of the left upper lobe.  After discussing the options for treatment despite the lack of tissue confirmation, she elected to proceed with stereotactic body radiotherapy versus watch and wait.  She just completed her third fraction today.  We discussed the rationale to proceed with repeat imaging in approximately 6 weeks time.  She states agreement and understanding, and at that time we will also be following a nodule that was in the left lower lobe.  She is seen on WebEx chat in the clinic. She reports her asthma has been acting up but responsive to her rescue inhaler which she used today.   She is afebrile with tachy rate in the 110s, otherwise saturation in the 97% range.  In general this is a well appearing caucasian female in no acute distress. She's alert and oriented x4 and appropriate throughout the examination. Cardiopulmonary assessment is negative for acute distress and  exhibits normal effort.   A/P: 1. Putative Stage IA2 NSCLC of the LUL. We will plan for repeat imaging in 6 weeks time. She is in agreement with this plan. We will also be looking at her LLL due to a previously noted nodule. She will keep [place] informed of any symptoms of pneumonitis as well as continue to follow up with pulmonary medicine.     Carola Rhine, PAC

## 2018-12-16 ENCOUNTER — Ambulatory Visit (INDEPENDENT_AMBULATORY_CARE_PROVIDER_SITE_OTHER): Payer: Medicare HMO | Admitting: Primary Care

## 2018-12-16 ENCOUNTER — Other Ambulatory Visit: Payer: Self-pay

## 2018-12-16 ENCOUNTER — Encounter: Payer: Self-pay | Admitting: Primary Care

## 2018-12-16 DIAGNOSIS — C3412 Malignant neoplasm of upper lobe, left bronchus or lung: Secondary | ICD-10-CM

## 2018-12-16 MED ORDER — PREDNISONE 10 MG PO TABS: ORAL_TABLET | ORAL | 0 refills | Status: DC

## 2018-12-16 MED ORDER — DOXYCYCLINE HYCLATE 100 MG PO TABS: 100.0000 mg | ORAL_TABLET | Freq: Two times a day (BID) | ORAL | 0 refills | Status: DC

## 2018-12-16 MED ORDER — BENZONATATE 200 MG PO CAPS: 200.0000 mg | ORAL_CAPSULE | Freq: Three times a day (TID) | ORAL | 1 refills | Status: DC | PRN

## 2018-12-16 NOTE — Assessment & Plan Note (Addendum)
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

## 2018-12-16 NOTE — Progress Notes (Signed)
_0  ID: Lori Butler, female    DOB: [birth year], 68 y.o.   MRN: [id number]  Chief Complaint  Patient presents with  . Follow-up    Referring provider: [name], MD  HPI: 68 year old female, former smoker quit May 2019 (74 pack year hx). PMH significant for COPD, hypoxia, anxiety/depression, attempted suicide, tobacco abuse. Patient of Dr. Elsworth Butler. Eos absolute in August 2019 700. Sudden onset and variability seem to suggest component of asthma. Maintained on Symbicort 160, Spiriva and Singulair.   Previous Lori Butler encounter: 09/16/2018- Televisit, 2 month fu- Revis Whalin,NP Patient called today for 2 month follow-up visit. Complains of shortness of breath over the past couple of days. Associated cough with thick white mucus, dark in the am and then clears. She does get relief from oral steroid course, feels better for a about a week or two. No fever, sick contacts or flu-like symptoms. Advised to stay on prednisone 66m daily x 6 weeks until follow-up. Referred to lung cancer screening clinic.   10/22/2018- Televisit, PET scan results - Dr. AElsworth Soho67 yoex-smokerfor FU of moderate COPD/asthma Sudden onset & variability suggesting a component of asthma She quit smoking 09/2017 She has been maintained on 10 mg of prednisone since she had a phone visit 09/16/2018 for COPD exacerbation.  She has done well since then on a breathing standpoint.  She is maintained on Spiriva and Symbicort with which she is compliant.  She takes albuterol nebs 2-3 times a day when she remembers to take this. She was referred to lung cancer screening and underwent CT chest 10/14/2018 which I reviewed with her in detail-she had bilateral upper lobe parenchymal scarring on her prior CT and left upper lobe scar that appeared more nodular on the CT measuring about 13 mm PET scan showed hypermetabolism with SUV 8 in this nodule She underwent PFTs which showed moderate reversible airway obstruction with FEV1 of 37%-0.91 which  improved to 47%-1.1L DLCO was about 54%  11/07/2018-Roma KayserNP Patient presents today for 6 week follow-up office visit. Since last televisit with myself patient met with lung cancer screening program and LDCT showed lung RADS 4x. PET scan positive LUL hypermetabolic spiculated pulmonary nodules consistent with primary bronchogenic carcinoma. Referred to CT surgery for second opinion. Seen by Dr. HRoxan Butler 10/29/18. 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 Potential treatment options were discussed. Patient is not interested in surgery and is strongly interested in radiation therapy. Plan navigation bronchoscopy for bx and fiducial placement on Monday 11/11/18.  She is doing well today, breathing has improved since starting prednisone 1796mdaily. Plan to taper 96m59m 4 weeks and then stop. Continues Symbicort twice daily and Spriva once daily. Using nebulizer twice daily. Morning cough with clear mucus.   11/26/18- Video visit, [name] NP Treated for COPD exacerbation with prednisone taper. Advised mucinex DM twice daily. Continue Symbicort and Spiriva. Albuterol neb every 6 hours prn.   12/16/2018  Patient presents today for 1 month follow-up. She had nav bronchoscopy with Dr. HenMichaelene Butler 7/6 with fiducial placement. Pathology and cytology was non-diagnostic with atypical cells no malignant cells. [facility] versus stereotactic therapy. Patient did not wish to wait and wanted to proceed with radiation therapy despite definitive carcinoma. She began SBRT 12/03/18 and has had three treatments total. Last treatment 8/4. Planning for CT with contrast in 6 weeks   Complains of increased shortness of breath and wheezing significantly since she  finished her third dose of radiation on 12/10/18. She has a reactive dry cough, get up some thick discolored mucus in the morning. Uses Albuterol nebulizer treatment in morning. Currently  on 38m prednisone. Appetite is ok, she is very tired. No antibiotic use recently.   Allergies  Allergen Reactions  . Demerol [Meperidine] Other (See Comments)    "horrible reaction"  . Oxycodone-Acetaminophen Other (See Comments)    Upset stomach   . Tramadol Nausea And Vomiting    Immunization History  Administered Date(s) Administered  . Influenza, High Dose Seasonal PF 01/21/2018  . Pneumococcal Conjugate-13 12/06/2017    Past Medical History:  Diagnosis Date  . [place] respiratory failure with [place] ([facility]   . Anxiety   . Aortic atherosclerosis ([facility]   . Arthritis    "a little in my hands probably" (10/31/2017)  . COPD (chronic obstructive pulmonary disease) ([facility]   . COPD exacerbation ([facility]   . Depression   . History of kidney stones   . Suicide attempt ([facility] 02/21/2018   overdosed on Wellbutrin    Tobacco History: Social History   Tobacco Use  Smoking Status Former Smoker  . Packs/day: 1.50  . Years: 51.00  . Pack years: 76.50  . Types: Cigarettes  . Quit date: 09/05/2017  . Years since quitting: 1.2  Smokeless Tobacco Never Used   Counseling given: Not Answered   Outpatient Medications Prior to Visit  Medication Sig Dispense Refill  . albuterol (PROVENTIL HFA;VENTOLIN HFA) 108 (90 Base) MCG/ACT inhaler Inhale 2 puffs into the lungs every 6 (six) hours as needed for wheezing or shortness of breath. 1 Inhaler 2  . albuterol (PROVENTIL) (2.5 MG/3ML) 0.083% nebulizer solution Take 3 mLs (2.5 mg total) by nebulization every 6 (six) hours as needed for wheezing or shortness of breath. 75 mL 6  . alendronate (FOSAMAX) 70 MG tablet Take 70 mg by mouth every Wednesday.     . budesonide-formoterol (SYMBICORT) 160-4.5 MCG/ACT inhaler Inhale 2 puffs into the lungs 2 (two) times daily. 1 Inhaler 0  . Cholecalciferol (VITAMIN D) 50 MCG (2000 UT) tablet Take 2,000 Units by mouth daily.    .Marland KitchenguaiFENesin (MUCINEX) 600 MG 12 hr tablet Take 600 mg by mouth 2 (two) times daily.      . montelukast (SINGULAIR) 10 MG tablet Take 1 tablet (10 mg total) by mouth at bedtime. 30 tablet 1  . pantoprazole (PROTONIX) 40 MG tablet Take 1 tablet (40 mg total) by mouth 2 (two) times daily. 60 tablet 1  . predniSONE (DELTASONE) 5 MG tablet Take 1 tablet (5 mg total) by mouth daily with breakfast. 30 tablet 3  . sertraline (ZOLOFT) 50 MG tablet Take 1 tablet (50 mg total) by mouth daily. 30 tablet 0  . tiotropium (SPIRIVA HANDIHALER) 18 MCG inhalation capsule Place 1 capsule (18 mcg total) into inhaler and inhale daily. 30 capsule 11  . predniSONE (DELTASONE) 10 MG tablet 2 tabs by mouth in the morning x5 days, then 1 tab in the morning x5 days and then resume 552mdaily and hold at this dose. 15 tablet 0   No facility-administered medications prior to visit.    Review of Systems  Review of Systems  Constitutional: Negative.   HENT: Negative.   Respiratory: Positive for cough, shortness of breath and wheezing.   Cardiovascular: Negative.    Physical Exam  BP 134/84 (BP Location: Right Arm, Cuff Size: Large)   Pulse (!) 103   Temp 98.7 F (37.1 C) (Oral)  Ht _0  (1.651 m)   Wt 178 lb (80.7 kg)   SpO2 95%   BMI 29.62 kg/m  Physical Exam Constitutional:      Appearance: Normal appearance.     Comments: Looks well, appears tired  Neck:     Musculoskeletal: Normal range of motion and neck supple.  Cardiovascular:     Rate and Rhythm: Normal rate and regular rhythm.  Pulmonary:     Effort: Pulmonary effort is normal. No respiratory distress.     Comments: Somewhat diminished, possible rhonchi left base. Dry cough. O2 95% RA.  Musculoskeletal: Normal range of motion.  Skin:    General: Skin is warm and dry.  Neurological:     General: No focal deficit present.     Mental Status: She is alert and oriented to person, place, and time. Mental status is at baseline.  Psychiatric:        Mood and Affect: Mood normal.        Behavior: Behavior normal.        Thought  Content: Thought content normal.        Judgment: Judgment normal.     Comments: Good spirits      Lab Results:  CBC    Component Value Date/Time   WBC 11.7 (H) 11/05/2018 0923   RBC 4.39 11/05/2018 0923   HGB 12.9 11/05/2018 0923   HCT 40.6 11/05/2018 0923   PLT 335 11/05/2018 0923   MCV 92.5 11/05/2018 0923   MCH 29.4 11/05/2018 0923   MCHC 31.8 11/05/2018 0923   RDW 13.2 11/05/2018 0923   LYMPHSABS 2.5 09/16/2018 0954   MONOABS 0.7 09/16/2018 0954   EOSABS 0.5 09/16/2018 0954   BASOSABS 0.2 (H) 09/16/2018 0954    BMET    Component Value Date/Time   NA 138 11/05/2018 0923   NA 140 11/06/2017   K 4.0 11/05/2018 0923   CL 105 11/05/2018 0923   CO2 22 11/05/2018 0923   GLUCOSE 99 11/05/2018 0923   BUN 16 12/10/2018 1357   BUN 17 11/06/2017   CREATININE 0.86 12/10/2018 1357   CALCIUM 8.8 (L) 11/05/2018 0923   GFRNONAA >60 12/10/2018 1357   GFRAA >60 12/10/2018 1357    BNP No results found for: BNP  ProBNP No results found for: PROBNP  Imaging: No results found.   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

## 2018-12-16 NOTE — Assessment & Plan Note (Signed)
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

## 2018-12-16 NOTE — Patient Instructions (Addendum)
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

## 2018-12-19 ENCOUNTER — Telehealth: Payer: Self-pay | Admitting: *Deleted

## 2018-12-19 NOTE — Telephone Encounter (Signed)
[facility] Psychosocial Distress Screening Clinical Social Work  Clinical Social Work was referred by distress screening protocol.  The patient scored a 6 on the Psychosocial Distress Thermometer which indicates moderate distress. Clinical Social Worker contacted patient by phone to follow up and to assess for distress and other psychosocial needs. Lori Butler shared she is coping well and has "never had any concerns about the cancer because there was a plan".  Patient indicates more frustration regarding her COPD and asthma, as they affect her daily living.  Patient lives alone, is somewhat concerned about medical bills but has chosen not to think about them until her treatment is completed. CSW briefly shared information on CSW role and support services.  ONCBCN DISTRESS SCREENING 11/14/2018  Distress experienced in past week (1-10) 6  Emotional problem type Nervousness/Anxiety;Adjusting to illness;Adjusting to appearance changes    Clinical Social Worker follow up needed: No.  If yes, follow up plan:  Gwinda Maine, LCSW

## 2018-12-27 NOTE — Progress Notes (Signed)
[facility]iation [facility] Simulation and Treatment Planning Note   Name:  Lori Butler MRN: [id number]   Date: 11/20/2018  DOB: 1950-09-19  Status:outpatient    DIAGNOSIS:    ICD-10-CM   1. Malignant neoplasm of upper lobe of left lung ([place])  C34.12      CONSENT VERIFIED:yes   SET UP: Patient is setup supine   IMMOBILIZATION: The patient was immobilized using a customized Vac Loc bag/ blue bag and customized accuform device   NARRATIVE:The patient was brought to the [facility].  Identity was confirmed.  All relevant records and images related to the planned course of therapy were reviewed.  Then, the patient was positioned in a stable reproducible clinical set-up for radiation therapy. Abdominal compression was applied by me.  4D CT images were obtained and reproducible breathing pattern was confirmed. Free breathing CT images were obtained.  Skin markings were placed.  The CT images were loaded into the planning software where the target and avoidance structures were contoured.  The radiation prescription was entered and confirmed.    TREATMENT PLANNING NOTE:  Treatment planning then occurred. I have requested : IMRT planning.This treatment technique is medically necessary due to the high-dose of radiation delivered to the target region which is in close proximity to adjacent critical normal structures.  3 dimensional simulation is performed and dose volume histogram of the gross tumor volume, planning tumor volume and criticial normal structures including the spinal cord and lungs were analyzed and requested.  Special treatment procedure was performed due to high dose per fraction.  The patient will be monitored for increased risk of toxicity.  Daily imaging using cone beam CT will be used for target localization.  I anticipate that the patient will receive 54 Gy in 3 fractions to target volume. Further adjustments will be made based on the  planning process is necessary.  ------------------------------------------------  Jodelle Gross, MD, PhD

## 2018-12-27 NOTE — Addendum Note (Signed)
Encounter addended by: Kyung Rudd, MD on: 12/27/2018 9:06 AM  Actions taken: Visit diagnoses modified, Clinical Note Signed

## 2018-12-27 NOTE — Progress Notes (Signed)
  Radiation Oncology         [phone] ________________________________  Name: Lori Butler MRN: [id number]  Date: 11/20/2018  DOB: 07/09/1950  RESPIRATORY MOTION MANAGEMENT SIMULATION  NARRATIVE:  In order to account for effect of respiratory motion on target structures and other organs in the planning and delivery of radiotherapy, this patient underwent respiratory motion management simulation.  To accomplish this, when the patient was brought to the CT simulation planning suite, 4D respiratoy motion management CT images were obtained.  The CT images were loaded into the planning software.  Then, using a variety of tools including Cine, MIP, and standard views, the target volume and planning target volumes (PTV) were delineated.  Avoidance structures were contoured.  Treatment planning then occurred.  Dose volume histograms were generated and reviewed for each of the requested structure.  The resulting plan was carefully reviewed and approved today.   ------------------------------------------------  Jodelle Gross, MD, PhD

## 2018-12-30 ENCOUNTER — Encounter: Payer: Self-pay | Admitting: Primary Care

## 2018-12-30 ENCOUNTER — Ambulatory Visit (INDEPENDENT_AMBULATORY_CARE_PROVIDER_SITE_OTHER): Payer: Medicare HMO | Admitting: Primary Care

## 2018-12-30 ENCOUNTER — Other Ambulatory Visit: Payer: Self-pay

## 2018-12-30 DIAGNOSIS — M810 Age-related osteoporosis without current pathological fracture: Secondary | ICD-10-CM | POA: Diagnosis not present

## 2018-12-30 DIAGNOSIS — F322 Major depressive disorder, single episode, severe without psychotic features: Secondary | ICD-10-CM | POA: Diagnosis not present

## 2018-12-30 DIAGNOSIS — K219 Gastro-esophageal reflux disease without esophagitis: Secondary | ICD-10-CM | POA: Diagnosis not present

## 2018-12-30 DIAGNOSIS — C3412 Malignant neoplasm of upper lobe, left bronchus or lung: Secondary | ICD-10-CM | POA: Diagnosis not present

## 2018-12-30 DIAGNOSIS — I700 Atherosclerosis of aorta: Secondary | ICD-10-CM | POA: Diagnosis not present

## 2018-12-30 DIAGNOSIS — J449 Chronic obstructive pulmonary disease, unspecified: Secondary | ICD-10-CM | POA: Diagnosis not present

## 2018-12-30 DIAGNOSIS — I2510 Atherosclerotic heart disease of native coronary artery without angina pectoris: Secondary | ICD-10-CM | POA: Diagnosis not present

## 2018-12-30 NOTE — Assessment & Plan Note (Signed)
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 Completed doxycycline and continues slow prednisone taper - Continue Symbicort 160 twice daily and Spiriva daily  - Use albuterol nebulizer every 6 hours as needed for shortness of breath/wheezing

## 2018-12-30 NOTE — Progress Notes (Signed)
_0  ID: Lori Butler, female    DOB: [birth year], 67 y.o.   MRN: [id number]  Chief Complaint  Patient presents with  . Follow-up    breathing better - still has deep cough (non-prod) - using nebs 2-3 x per day    Referring provider: [name], MD  HPI: 68 year old female, former smoker quit May 2019 (74 pack year hx). PMH significant for COPD, hypoxia, anxiety/depression, attempted suicide, tobacco abuse. Patient of Dr. Elsworth Butler. Eos absolute in August 2019 700. Sudden onset and variability seem to suggest component of asthma. Maintained on Symbicort 160, Spiriva and Singulair.   Previous [place] encounter: 09/16/2018- Televisit, 2 month fu- Lori Tetrault,NP Patient called today for 2 month follow-up visit. Complains of shortness of breath over the past couple of days. Associated cough with thick white mucus, dark in the am and then clears. She does get relief from oral steroid course, feels better for a about a week or two. No fever, sick contacts or flu-like symptoms. Advised to stay on prednisone 47m daily x 6 weeks until follow-up. Referred to lung cancer screening clinic.   10/22/2018- Televisit, PET scan results - Dr. AElsworth Soho67 yoex-smokerfor FU of moderate COPD/asthma Sudden onset & variability suggesting a component of asthma She quit smoking 09/2017 She has been maintained on 10 mg of prednisone since she had a phone visit 09/16/2018 for COPD exacerbation.  She has done well since then on a breathing standpoint.  She is maintained on Spiriva and Symbicort with which she is compliant.  She takes albuterol nebs 2-3 times a day when she remembers to take this. She was referred to lung cancer screening and underwent CT chest 10/14/2018 which I reviewed with her in detail-she had bilateral upper lobe parenchymal scarring on her prior CT and left upper lobe scar that appeared more nodular on the CT measuring about 13 mm PET scan showed hypermetabolism with SUV 8 in this nodule She underwent  PFTs which showed moderate reversible airway obstruction with FEV1 of 37%-0.91 which improved to 47%-1.1L DLCO was about 54%  11/07/2018-Lori KayserNP Patient presents today for 6 week follow-up office visit. Since last televisit with myself patient met with lung cancer screening program and LDCT showed lung RADS 4x. PET scan positive LUL hypermetabolic spiculated pulmonary nodules consistent with primary bronchogenic carcinoma. Referred to CT surgery for second opinion. Seen by Dr. HRoxan Butler 10/29/18. 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 Potential treatment options were discussed. Patient is not interested in surgery and is strongly interested in radiation therapy. Plan navigation bronchoscopy for bx and fiducial placement on Monday 11/11/18.  She is doing well today, breathing has improved since starting prednisone 17mdaily. Plan to taper 32m31m 4 weeks and then stop. Continues Symbicort twice daily and Spriva once daily. Using nebulizer twice daily. Morning cough with clear mucus.   11/26/18- Video visit, [name] NP Treated for COPD exacerbation with prednisone taper. Advised mucinex DM twice daily. Continue Symbicort and Spiriva. Albuterol neb every 6 hours prn.   12/16/2018  Patient presents today for 1 month follow-up. She had nav bronchoscopy with Dr. HenMichaelene Butler 7/6 with fiducial placement. Pathology and cytology was non-diagnostic with atypical cells no malignant cells. [facility] versus stereotactic therapy. Patient did not wish to wait and wanted to proceed with radiation therapy despite definitive carcinoma. She began SBRT 12/03/18 and has had three treatments total. Last treatment 8/4. Planning for CT  with contrast in 6 weeks   Complains of increased shortness of breath and wheezing significantly since she finished her third dose of radiation on 12/10/18. She has a reactive dry cough, get up some thick  discolored mucus in the morning. Uses Albuterol nebulizer treatment in morning. Currently on 19m prednisone. Appetite is ok, she is very tired. No antibiotic use recently.   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 Treated with doxycycline course and extended prednisone taper. She is feeling a whole lot better since last visit. Uses nebs two- three times a day. Has deep non-productive cough. States that she has to take a breathing treatment after she showers and is able to get up some thicker white mucus. She is still very tired after radiation treatments. Has a follow up CT chest in 1 month with rad/onc.    Allergies  Allergen Reactions  . Demerol [Meperidine] Other (See Comments)    "horrible reaction"  . Oxycodone-Acetaminophen Other (See Comments)    Upset stomach   . Tramadol Nausea And Vomiting    Immunization History  Administered Date(s) Administered  . Influenza, High Dose Seasonal PF 01/21/2018  . Pneumococcal Conjugate-13 12/06/2017    Past Medical History:  Diagnosis Date  . Acute respiratory failure with hypoxia ([place]   . Anxiety   . Aortic atherosclerosis ([place]   . Arthritis    "a little in my hands probably" (10/31/2017)  . COPD (chronic obstructive pulmonary disease) ([facility]   . [facility]   . Depression   . History of kidney stones   . [place] 02/[place]/2018   overdosed on Wellbutrin    Tobacco History: Social History   Tobacco Use  Smoking Status Former Smoker  . Packs/day: 1.50  . Years: 51.00  . Pack years: 76.50  . Types: Cigarettes  . Quit date: 09/05/2017  . Years since quitting: 1.3  Smokeless Tobacco Never Used   Counseling given: Not Answered   Outpatient Medications Prior to Visit  Medication Sig Dispense Refill  . albuterol (PROVENTIL HFA;VENTOLIN HFA) 108 (90 Base) MCG/ACT inhaler Inhale 2 puffs into the lungs every 6 (six) hours as needed for  wheezing or shortness of breath. 1 Inhaler 2  . albuterol (PROVENTIL) (2.5 MG/3ML) 0.083% nebulizer solution Take 3 mLs (2.5 mg total) by nebulization every 6 (six) hours as needed for wheezing or shortness of breath. 75 mL 6  . alendronate (FOSAMAX) 70 MG tablet Take 70 mg by mouth every Wednesday.     . benzonatate (TESSALON) 200 MG capsule Take 1 capsule (200 mg total) by mouth 3 (three) times daily as needed for cough. 30 capsule 1  . budesonide-formoterol (SYMBICORT) 160-4.5 MCG/ACT inhaler Inhale 2 puffs into the lungs 2 (two) times daily. 1 Inhaler 0  . Cholecalciferol (VITAMIN D) 50 MCG (2000 UT) tablet Take 2,000 Units by mouth daily.    .Marland KitchenguaiFENesin (MUCINEX) 600 MG 12 hr tablet Take 600 mg by mouth 2 (two) times daily.     . montelukast (SINGULAIR) 10 MG tablet Take 1 tablet (10 mg total) by mouth at bedtime. 30 tablet 1  . pantoprazole (PROTONIX) 40 MG tablet Take 1 tablet (40 mg total) by mouth 2 (two) times daily. 60 tablet 1  . predniSONE (DELTASONE) 10 MG tablet Take 4 tabs x 5 days; 3 tabs x 5 days; 2 tabs x 5 days; 1 tab x 5 days then resume 517mdaily 50 tablet 0  .  sertraline (ZOLOFT) 50 MG tablet Take 1 tablet (50 mg total) by mouth daily. 30 tablet 0  . tiotropium (SPIRIVA HANDIHALER) 18 MCG inhalation capsule Place 1 capsule (18 mcg total) into inhaler and inhale daily. 30 capsule 11  . doxycycline (VIBRA-TABS) 100 MG tablet Take 1 tablet (100 mg total) by mouth 2 (two) times daily. 14 tablet 0  . predniSONE (DELTASONE) 5 MG tablet Take 1 tablet (5 mg total) by mouth daily with breakfast. (Patient not taking: Reported on 12/30/2018) 30 tablet 3   No facility-administered medications prior to visit.     Review of Systems  Review of Systems  Constitutional: Positive for fatigue.  Respiratory: Positive for cough. Negative for shortness of breath and wheezing.   Cardiovascular: Negative.    Physical Exam  BP 124/64 (BP Location: Right Arm, Patient Position: Sitting, Cuff  Size: Normal)   Pulse 91   Temp 97.7 F (36.5 C)   Ht 5' 5" (1.651 m)   Wt 181 lb 9.6 oz (82.4 kg)   SpO2 95%   BMI 30.22 kg/m  Physical Exam Constitutional:      Appearance: Normal appearance. She is not ill-appearing.  HENT:     Head: Normocephalic and atraumatic.  Neck:     Musculoskeletal: Normal range of motion and neck supple.  Cardiovascular:     Rate and Rhythm: Normal rate and regular rhythm.  Pulmonary:     Effort: Pulmonary effort is normal.     Breath sounds: Normal breath sounds.     Comments: CTA. No wheezing or cough. O2 95% RA.  Musculoskeletal: Normal range of motion.  Skin:    General: Skin is warm and dry.  Neurological:     General: No focal deficit present.     Mental Status: She is alert and oriented to person, place, and time. Mental status is at baseline.  Psychiatric:        Mood and Affect: Mood normal.        Behavior: Behavior normal.        Thought Content: Thought content normal.        Judgment: Judgment normal.      Lab Results:  CBC    Component Value Date/Time   WBC 11.7 (H) 11/05/2018 0923   RBC 4.39 11/05/2018 0923   HGB 12.9 11/05/2018 0923   HCT 40.6 11/05/2018 0923   PLT 335 11/05/2018 0923   MCV 92.5 11/05/2018 0923   MCH 29.4 11/05/2018 0923   MCHC 31.8 11/05/2018 0923   RDW 13.2 11/05/2018 0923   LYMPHSABS 2.5 09/16/2018 0954   MONOABS 0.7 09/16/2018 0954   EOSABS 0.5 09/16/2018 0954   BASOSABS 0.2 (H) 09/16/2018 0954    BMET    Component Value Date/Time   NA 138 11/05/2018 0923   NA 140 11/06/2017   K 4.0 11/05/2018 0923   CL 105 11/05/2018 0923   CO2 22 11/05/2018 0923   GLUCOSE 99 11/05/2018 0923   BUN 16 12/10/2018 1357   BUN 17 11/06/2017   CREATININE 0.86 12/10/2018 1357   CALCIUM 8.8 (L) 11/05/2018 0923   GFRNONAA >60 12/10/2018 1357   GFRAA >60 12/10/2018 1357    BNP No results found for: BNP  ProBNP No results found for: PROBNP  Imaging: No results found.   Assessment & Plan:   COPD  (chronic obstructive pulmonary disease) ([place]) - Improved  - Treated for suspected radiation induced fibrosis vs COPD exacerbation 2 weeks ago. Completed doxycycline and continues slow prednisone taper -  Continue Symbicort 160 twice daily and Spiriva daily  - Use albuterol nebulizer every 6 hours as needed for shortness of breath/wheezing    Malignant neoplasm of upper lobe of left lung [facility]) - She has a follow-up CT chest in 1 month with rad/onc   Martyn Ehrich, NP 12/30/2018

## 2018-12-30 NOTE — Assessment & Plan Note (Signed)
-   She has a follow-up CT chest in 1 month with rad/onc

## 2018-12-30 NOTE — Patient Instructions (Addendum)
Glad you are feeling better  Continue prednisone taper, if breathing worsens when coming off prednisone let me know  Continue Symbicort twice daily and Spiriva  Use albuterol nebulizer every 6 hours as needed for shortness of breath/wheezing   CT in 1 month with rad/onc  Follow up in 1-2 months with Dr. Elsworth Soho

## 2018-12-31 ENCOUNTER — Other Ambulatory Visit: Payer: Self-pay | Admitting: Family Medicine

## 2018-12-31 DIAGNOSIS — M810 Age-related osteoporosis without current pathological fracture: Secondary | ICD-10-CM

## 2019-01-06 MED ORDER — PREDNISONE 10 MG PO TABS: ORAL_TABLET | ORAL | 0 refills | Status: DC

## 2019-01-06 NOTE — Telephone Encounter (Signed)
Pt sent the following MyChart message 01/06/2019 T 3:55 AM EDT:  "I started to feel the effect of more labored breathing at 10 mgs of prednozone at 5 it is much more difficult more breathing treatments."  According to last office notes 12/30/2018 by Eustaquio Maize, pt was to "continue prednisone taper" and "if breathing worsens when coming off prednisone, let me know."  I am routing this message to [facility] for follow up. Beth, please advise with your recommendations for this pt. Thank you.

## 2019-01-06 NOTE — Telephone Encounter (Signed)
Have her stay on 10mg  daily for another week and taper to 5mg  additional week then stop. Can you send in refill for additional 14 tabs

## 2019-01-06 NOTE — Telephone Encounter (Signed)
You got it. Before I send the refill, are the 14 tabs for the 10 mg prednisone only or for both the 10 mg and the 5 mg? Please advise. Thank you

## 2019-01-06 NOTE — Telephone Encounter (Signed)
Noted. Thank you.   I called and spoke with patient regarding Beth's recommendations. Pt verbalized understanding with no additional questions. I let her know we would be sending in a reorder to her pharmacy. Verified her preferred pharmacy is [facility]  off [place].   Order has been placed per [facility] with specific directions. Nothing further needed at this time.

## 2019-01-06 NOTE — Telephone Encounter (Signed)
You can do 10mg  tabs and she can cut in half

## 2019-01-15 NOTE — Progress Notes (Signed)
  Radiation Oncology         [phone] ________________________________  Name: Lori Butler MRN: [id number]  Date: 12/10/2018  DOB: 06-27-1950  End of Treatment Note  Diagnosis:   The encounter diagnosis was Malignant neoplasm of upper lobe of left lung ([place]).    Indication for treatment::  curative       Radiation treatment dates:   12/03/2018 through 12/10/2018  Site/dose:   The patient was treated to the left lung with a course of stereotactic body radiation treatment.  The patient received 54 Gray in 3 fractions using a IMRT technique, with 3 fields.  Narrative: The patient tolerated radiation treatment relatively well.   No unexpected difficulties.  The patient's breathing did not significantly change during the course of the treatment.  Plan: The patient has completed radiation treatment. The patient will return to radiation oncology clinic for routine followup in one month. I advised the patient to call or return sooner if they have any questions or concerns related to their recovery or treatment. ________________________________  Jodelle Gross, M.D., Ph.D.

## 2019-01-19 ENCOUNTER — Encounter: Payer: Self-pay | Admitting: *Deleted

## 2019-01-21 ENCOUNTER — Telehealth: Payer: Self-pay | Admitting: *Deleted

## 2019-01-21 ENCOUNTER — Other Ambulatory Visit: Payer: Self-pay | Admitting: Radiation Oncology

## 2019-01-21 DIAGNOSIS — C3412 Malignant neoplasm of upper lobe, left bronchus or lung: Secondary | ICD-10-CM

## 2019-01-21 NOTE — Telephone Encounter (Signed)
CALLED PATIENT TO ASK ABOUT COMING IN FOR STAT LABS ON 01-24-19, PATIENT AGREED TO COME IN ON 01-24-19 @ 1:45 PM

## 2019-01-22 ENCOUNTER — Telehealth: Payer: Self-pay | Admitting: Pulmonary Disease

## 2019-01-22 ENCOUNTER — Encounter: Payer: Self-pay | Admitting: *Deleted

## 2019-01-22 NOTE — Telephone Encounter (Signed)
Called and spoke to patient.  Patient stated that she has been having increased shortness of breath and coughing spells. Patient stated cough is productive of thick white sputum. Patient reports she is completely off steroids currently but may need to be back on something.  Patient stated that she is using albuterol via nebulizer 3 times a day and in between those nebulizer treatments she is also using her albuterol inhaler. Patient stated she continues to struggle with coughing spells.  Patient asked to have visit with Derl Barrow, NP tomorrow.  Scheduled patient for MyChart video visit with NP. Patient reports she has had several video visits in the past and is comfortable doing these.  Nothing further needed at this time.

## 2019-01-23 ENCOUNTER — Telehealth (INDEPENDENT_AMBULATORY_CARE_PROVIDER_SITE_OTHER): Payer: Medicare HMO | Admitting: Primary Care

## 2019-01-23 ENCOUNTER — Encounter: Payer: Self-pay | Admitting: Primary Care

## 2019-01-23 DIAGNOSIS — C3412 Malignant neoplasm of upper lobe, left bronchus or lung: Secondary | ICD-10-CM

## 2019-01-23 DIAGNOSIS — J449 Chronic obstructive pulmonary disease, unspecified: Secondary | ICD-10-CM | POA: Diagnosis not present

## 2019-01-23 MED ORDER — PROMETHAZINE-CODEINE 6.25-10 MG/5ML PO SYRP: 5.0000 mL | ORAL_SOLUTION | Freq: Four times a day (QID) | ORAL | 0 refills | Status: DC | PRN

## 2019-01-23 MED ORDER — PREDNISONE 10 MG PO TABS: ORAL_TABLET | ORAL | 0 refills | Status: DC

## 2019-01-23 NOTE — Progress Notes (Signed)
Virtual Visit via Telephone Note  I connected with Lori Butler on 01/23/19 at  9:00 AM EDT by telephone and verified that I am speaking with the correct person using two identifiers.  Location: Patient: Home Provider: Office   I discussed the limitations, risks, security and privacy concerns of performing an evaluation and management service by telephone and the availability of in person appointments. I also discussed with the patient that there may be a patient responsible charge related to this service. The patient expressed understanding and agreed to proceed.   History of Present Illness: 68 year old female, former smoker quit May 2019 (74 pack year hx). PMH significant for COPD, malignant neoplasm upper lobe left lung, hypoxia, anxiety/depression, attempted suicide, tobacco abuse. Patient of Dr. Elsworth Butler. Eos absolute in August 2019 700. Sudden onset and variability seem to suggest component of asthma. Maintained on Symbicort 160, Spiriva and Singulair.   Previous [place] encounter: 09/16/2018- Televisit, 2 month fu- ,NP Patient called today for 2 month follow-up visit. Complains of shortness of breath over the past couple of days. Associated cough with thick white mucus, dark in the am and then clears. She does get relief from oral steroid course, feels better for a about a week or two. No fever, sick contacts or flu-like symptoms. Advised to stay on prednisone 40m daily x 6 weeks until follow-up. Referred to lung cancer screening clinic.   10/22/2018- Televisit, PET scan results - Dr. AElsworth Soho67 yoex-smokerfor FU of moderate COPD/asthma Sudden onset & variability suggesting a component of asthma She quit smoking 09/2017 She has been maintained on 10 mg of prednisone since she had a phone visit 09/16/2018 for COPD exacerbation.  She has done well since then on a breathing standpoint.  She is maintained on Spiriva and Symbicort with which she is compliant.  She takes albuterol nebs 2-3  times a day when she remembers to take this. She was referred to lung cancer screening and underwent CT chest 10/14/2018 which I reviewed with her in detail-she had bilateral upper lobe parenchymal scarring on her prior CT and left upper lobe scar that appeared more nodular on the CT measuring about 13 mm PET scan showed hypermetabolism with SUV 8 in this nodule She underwent PFTs which showed moderate reversible airway obstruction with FEV1 of 37%-0.91 which improved to 47%-1.1L DLCO was about 54%  11/07/2018-Lori KayserNP Patient presents today for 6 week follow-up office visit. Since last televisit with myself patient met with lung cancer screening program and LDCT showed lung RADS 4x. PET scan positive LUL hypermetabolic spiculated pulmonary nodules consistent with primary bronchogenic carcinoma. Referred to CT surgery for second opinion. Seen by Dr. HRoxan Hockeyon 10/29/18. 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 Potential treatment options were discussed. Patient is not interested in surgery and is strongly interested in radiation therapy. Plan navigation bronchoscopy for bx and fiducial placement on Monday 11/11/18. She is doing well today, breathing has improved since starting prednisone 152mdaily. Plan to taper 52m352m 4 weeks and then stop. Continues Symbicort twice daily and Spriva once daily. Using nebulizer twice daily. Morning cough with clear mucus.   11/26/18- Video visit, [name] NP Treated for COPD exacerbation with prednisone taper. Advised mucinex DM twice daily. Continue Symbicort and Spiriva. Albuterol neb every 6 hours prn.   12/16/2018  Patient presents today for 1 month follow-up. She had nav bronchoscopy with Dr. HenMichaelene Song 7/6 with fiducial placement. Pathology and cytology was non-diagnostic with atypical  cells no malignant cells. Case discussed at [facility], felt patient could continue observation versus stereotactic therapy. Patient did not wish to wait  and wanted to proceed with radiation therapy despite definitive carcinoma. She began SBRT 12/03/18 and has had three treatments total. Last treatment 8/4. Planning for CT with contrast in 6 weeks. Complains of increased shortness of breath and wheezing significantly since she finished her third dose of radiation on 12/10/18. She has a reactive dry cough, get up some thick discolored mucus in the morning. Uses Albuterol nebulizer treatment in morning. Currently on 55m prednisone. Appetite is ok, she is very tired. No antibiotic use recently.   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 Treated with doxycycline course and extended prednisone taper. She is feeling a whole lot better since last visit. Uses nebs two- three times a day. Has deep non-productive cough. States that she has to take a breathing treatment after she showers and is able to get up some thicker white mucus. She is still very tired after radiation treatments. Has a follow up CT chest in 1 month with rad/onc.   01/23/2019 Patient contacted today for videovist. Reports increased shortness of breath and cough x 4-5 day. Occasional wheezing and chest tightness. Hacking cough has gotten worse over the last 1-2 weeks. Cough is mostly dry. [name] have not been helping. She is using her Albuterol rescue inhaler more frequently. Completed extended prednisone taper on Monday. Shortness of breath started a little bit before stopping. O2 88% lowest on exertion, improves with rest. Swelling feet and chin has gone down since being off steroid. She has CT chest scheduled for tomorrow with oncology for follow-up malignant neoplasm of left upper lobe lung.    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  - Shortness of breath and cough worsened off prednisone - No signs of  bacterial infection, abx not indicated at this time  - RX 452mx 3 days; 3081m 3 days, 54m31m3 days; then 10mg53mly until stopped by provider  Cough - Delsym twice a day; tessalon three times a day - Add promethazine with codeine 5ml e83my 6 hours for cough   Malignant neoplasm of left upper lobe lung - SBRT 12/03/18 and has had three treatments total (Last treatment 8/4) - CT chest w contrast scheduled for tomorrow with radiation/oncology    Follow Up Instructions:  - 2 weeks with NP  (video visit or office)   I discussed the assessment and treatment plan with the patient. The patient was provided an opportunity to ask questions and all were answered. The patient agreed with the plan and demonstrated an understanding of the instructions.   The patient was advised to call back or seek an in-person evaluation if the symptoms worsen or if the condition fails to improve as anticipated.  I provided 20 minutes of non-face-to-face time during this encounter.   ElizabMartyn Ehrich

## 2019-01-23 NOTE — Patient Instructions (Addendum)
Cough recommendations: - Please take Delsym twice a day along with [name] three times a day - Adding promethazine with codeine - take 70ml every 6 hours for cough - Changed taper to Prednisone 40mg  x 3 days; 30mg  x 3 days; 20mg  x 3 days; then STAY on 10 mg daily until stopped or changed by provider   Follow-up: - 2 weeks with NP (video visit or office)

## 2019-01-24 ENCOUNTER — Other Ambulatory Visit: Payer: Self-pay

## 2019-01-24 ENCOUNTER — Ambulatory Visit (HOSPITAL_COMMUNITY)
Admission: RE | Admit: 2019-01-24 | Discharge: 2019-01-24 | Disposition: A | Discharge status: Home / Self Care | Payer: Medicare HMO | Source: Ambulatory Visit | Attending: Radiation Oncology | Admitting: Radiation Oncology

## 2019-01-24 ENCOUNTER — Ambulatory Visit
Admission: RE | Admit: 2019-01-24 | Discharge: 2019-01-24 | Disposition: A | Discharge status: Home / Self Care | Payer: Medicare HMO | Source: Ambulatory Visit | Attending: Radiation Oncology | Admitting: Radiation Oncology

## 2019-01-24 DIAGNOSIS — C3490 Malignant neoplasm of unspecified part of unspecified bronchus or lung: Secondary | ICD-10-CM | POA: Diagnosis not present

## 2019-01-24 DIAGNOSIS — Z510 Encounter for antineoplastic radiation therapy: Secondary | ICD-10-CM | POA: Diagnosis not present

## 2019-01-24 DIAGNOSIS — Z87891 Personal history of nicotine dependence: Secondary | ICD-10-CM | POA: Diagnosis not present

## 2019-01-24 DIAGNOSIS — C3412 Malignant neoplasm of upper lobe, left bronchus or lung: Secondary | ICD-10-CM | POA: Insufficient documentation

## 2019-01-24 LAB — BUN & CREATININE (CHCC)
BUN: 21 mg/dL (ref 8–23)
Creatinine: 1 mg/dL (ref 0.44–1.00)
GFR, Est AFR Am: >60 mL/min (ref >60)
GFR, Estimated: 57 mL/min — ABNORMAL LOW (ref >60)

## 2019-01-24 MED ORDER — SODIUM CHLORIDE (PF) 0.9 % IJ SOLN
INTRAMUSCULAR | Status: AC
  Filled 2019-01-24: qty 50

## 2019-01-24 MED ORDER — IOHEXOL 300 MG/ML  SOLN
75.0000 mL | Freq: Once | INTRAMUSCULAR | Status: AC | PRN
  Administered 2019-01-24: 75 mL via INTRAVENOUS

## 2019-01-27 ENCOUNTER — Telehealth: Payer: Self-pay | Admitting: Radiation Oncology

## 2019-01-27 DIAGNOSIS — C3412 Malignant neoplasm of upper lobe, left bronchus or lung: Secondary | ICD-10-CM

## 2019-01-27 NOTE — Telephone Encounter (Signed)
I called and spoke with the patient to let her know we were pleased with her post treatment imaging and we will see her remotely or by phone in 6 months for continued evaluation and scans per NCCN guidelines. She is in agreement and will continue to follow up with pulmonary medicine for her asthma, an with [place] if she has questions or concerns prior to her next visit.

## 2019-02-06 ENCOUNTER — Telehealth (INDEPENDENT_AMBULATORY_CARE_PROVIDER_SITE_OTHER): Payer: Medicare HMO | Admitting: Primary Care

## 2019-02-06 ENCOUNTER — Encounter: Payer: Self-pay | Admitting: Primary Care

## 2019-02-06 DIAGNOSIS — J441 Chronic obstructive pulmonary disease with (acute) exacerbation: Secondary | ICD-10-CM

## 2019-02-06 MED ORDER — DOXYCYCLINE HYCLATE 100 MG PO TABS: 100.0000 mg | ORAL_TABLET | Freq: Two times a day (BID) | ORAL | 0 refills | Status: DC

## 2019-02-06 MED ORDER — GUAIFENESIN ER 600 MG PO TB12: 1200.0000 mg | ORAL_TABLET | Freq: Two times a day (BID) | ORAL | 0 refills | Status: DC

## 2019-02-06 NOTE — Patient Instructions (Signed)
   Recommendations: - RX doxycycline 1 tab twice daily x 7 days - Increasing mucinex 1,200mg  twice daily (for 7-10 days) - Advised Albuterol nebulizer 2-3 times a day followed by Flutter valve  - Continue Prednisone 10mg  daily x 1 week; then taper to 5mg  x 1 week and then stop   Follow-up: Dr. Elsworth Soho in 2 months

## 2019-02-06 NOTE — Progress Notes (Signed)
Virtual Visit via Telephone Note  I connected with Lori Butler on 02/06/19 at  9:30 AM EDT by telephone and verified that I am speaking with the correct person using two identifiers.  Location: Patient: Home Provider: Office   I discussed the limitations, risks, security and privacy concerns of performing an evaluation and management service by telephone and the availability of in person appointments. I also discussed with the patient that there may be a patient responsible charge related to this service. The patient expressed understanding and agreed to proceed.   History of Present Illness: 68 year old female, former smoker quit May 2019 (74 pack year hx). PMH significant for COPD, malignant neoplasm upper lobe left lung, hypoxia, anxiety/depression, attempted suicide, tobacco abuse. Patient of Dr. Elsworth Soho. Eos absolute in August 2019 700. Sudden onset and variability seem to suggest component of asthma. Maintained on Symbicort 160, Spiriva and Singulair.   Previous [facility] encounter: 09/16/2018- Televisit, 2 month fu- [place] Patient called today for 2 month follow-up visit. Complains of shortness of breath over the past couple of days. Associated cough with thick white mucus, dark in the am and then clears. She does get relief from oral steroid course, feels better for a about a week or two. No fever, sick contacts or flu-like symptoms. Advised to stay on prednisone '10mg'$  daily x 6 weeks until follow-up. Referred to lung cancer screening clinic.   10/22/2018- Televisit, PET scan results - Dr. Elsworth Soho 67 yoex-smokerfor FU of moderate COPD/asthma Sudden onset & variability suggesting a component of asthma She quit smoking 09/2017 She has been maintained on 10 mg of prednisone since she had a phone visit 09/16/2018 for COPD exacerbation.  She has done well since then on a breathing standpoint.  She is maintained on Spiriva and Symbicort with which she is compliant.  She takes albuterol  nebs 2-3 times a day when she remembers to take this. She was referred to lung cancer screening and underwent CT chest 10/14/2018 which I reviewed with her in detail-she had bilateral upper lobe parenchymal scarring on her prior CT and left upper lobe scar that appeared more nodular on the CT measuring about 13 mm PET scan showed hypermetabolism with SUV 8 in this nodule She underwent PFTs which showed moderate reversible airway obstruction with FEV1 of 37%-0.91 which improved to 47%-1.1L DLCO was about 54%  7/2/2020[name] NP Patient presents today for 6 week follow-up office visit. Since last televisit with myself patient met with lung cancer screening program and LDCT showed lung RADS 4x. PET scan positive LUL hypermetabolic spiculated pulmonary nodules consistent with primary bronchogenic carcinoma. Referred to CT surgery for second opinion. Seen by Dr. Roxan Hockey on 10/29/18. 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 Potential treatment options were discussed. Patient is not interested in surgery and is strongly interested in radiation therapy. Plan navigation bronchoscopy for bx and fiducial placement on Monday 11/11/18. She is doing well today, breathing has improved since starting prednisone '10mg'$  daily. Plan to taper '5mg'$  x 4 weeks and then stop. Continues Symbicort twice daily and Spriva once daily. Using nebulizer twice daily. Morning cough with clear mucus.   11/26/18- Video visit, [name] NP Treated for COPD exacerbation with prednisone taper. Advised mucinex DM twice daily. Continue Symbicort and Spiriva. Albuterol neb every 6 hours prn.   12/16/2018  Patient presents today for 1 month follow-up. She had nav bronchoscopy with Dr. [name] on 7/6 with fiducial placement. Pathology and cytology were non-diagnostic with  atypical cells no malignant cells. Case discussed at [facility], felt patient could continue observation versus stereotactic therapy. Patient did not wish  to wait and wanted to proceed with radiation therapy despite definitive carcinoma. She began SBRT 12/03/18 and has had three treatments total. Last treatment 8/4. Planning for CT with contrast in 6 weeks. Complains of increased shortness of breath and wheezing significantly since she finished her third dose of radiation on 12/10/18. She has a reactive dry cough, get up some thick discolored mucus in the morning. Uses Albuterol nebulizer treatment in morning. Currently on '5mg'$  prednisone. Appetite is ok, she is very tired. No antibiotic use recently.   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 Treated with doxycycline course and extended prednisone taper. She is feeling a whole lot better since last visit. Uses nebs two- three times a day. Has deep non-productive cough. States that she has to take a breathing treatment after she showers and is able to get up some thicker white mucus. She is still very tired after radiation treatments. Has a follow up CT chest in 1 month with rad/onc.   01/23/2019 Patient contacted today for videovist. Reports increased shortness of breath and cough x 4-5 day. Occasional wheezing and chest tightness. Hacking cough has gotten worse over the last 1-2 weeks. Cough is mostly dry. [name] have not been helping. She is using her Albuterol rescue inhaler more frequently. Completed extended prednisone taper on Monday. Shortness of breath started a little bit before stopping. O2 88% lowest on exertion, improves with rest. Swelling feet and chin has gone down since being off steroid. She has CT chest scheduled for tomorrow with oncology for follow-up malignant neoplasm of left upper lobe lung.   02/06/2019 Patient contacted today for acute televisit with reports of increased cough. She feels like her breathing is at baseline. Producing more mucus two days ago and low grade temp. Taking Spiriva Handihaler and  Symbicort 160. Currently on prednisone '10mg'$  daily. Only uses albuterol nebulizer as needed which is rare. Finished with radiation. Follow-up CT chest showed LUL nodule 1.2 cm unchanged from previous, no new pulmonary nodules/masses. Moderate to advanced changes parseptal and centrilobular emphysema. Biapical pleuroparenchymal scarring. No axillary or supraclavicular adenopathy. No mediastinal or hilar adenopathy. Per oncology they are pleased with her post treatment imaging and will see her in 6 months for continued evaluation.     Observations/Objective:  - No significant shortness of breath, wheezing or cough noted over phone conversation   Assessment and Plan:  COPD exacerbation/bronchitis  - Increased cough with thick green sputum production - RX doxycycline 1 tab twice daily x 7 days - Increasing mucinex 1,'200mg'$  BID - Advised Albuterol nebulizer 2-3 times a day followed by Flutter valve  - Continue Prednisone '10mg'$  daily x 1 week; taper '5mg'$  x 1 week and then stop   Malignant neoplasm of left upper lobe lung: - Completed SBRT x3 - CT chest 01/24/19 showed showed LUL nodule 1.2 cm unchanged from previous, no new pulmonary nodules/masses - Per oncology they are pleased with her post treatment imaging and will see her in 6 months for continued evaluation   Follow Up Instructions:  - FU in 2 months with Dr. Elsworth Soho   I discussed the assessment and treatment plan with the patient. The patient was provided an opportunity to ask questions and all were answered. The patient agreed with the plan and demonstrated an understanding of the instructions.   The patient  was advised to call back or seek an in-person evaluation if the symptoms worsen or if the condition fails to improve as anticipated.  I provided 18 minutes of non-face-to-face time during this encounter.   Martyn Ehrich, NP

## 2019-02-19 ENCOUNTER — Other Ambulatory Visit: Payer: Self-pay

## 2019-02-19 ENCOUNTER — Emergency Department (HOSPITAL_COMMUNITY)
Admission: EM | Admit: 2019-02-19 | Discharge: 2019-02-19 | Disposition: A | Discharge status: Home / Self Care | Payer: Medicare HMO | Attending: Emergency Medicine | Admitting: Emergency Medicine

## 2019-02-19 ENCOUNTER — Emergency Department (HOSPITAL_COMMUNITY): Payer: Medicare HMO

## 2019-02-19 ENCOUNTER — Encounter (HOSPITAL_COMMUNITY): Payer: Self-pay | Admitting: Obstetrics and Gynecology

## 2019-02-19 ENCOUNTER — Encounter: Payer: Self-pay | Admitting: Adult Health

## 2019-02-19 ENCOUNTER — Telehealth (INDEPENDENT_AMBULATORY_CARE_PROVIDER_SITE_OTHER): Payer: Medicare HMO | Admitting: Adult Health

## 2019-02-19 ENCOUNTER — Telehealth: Payer: Self-pay | Admitting: Pulmonary Disease

## 2019-02-19 DIAGNOSIS — Z79899 Other long term (current) drug therapy: Secondary | ICD-10-CM | POA: Insufficient documentation

## 2019-02-19 DIAGNOSIS — Z87891 Personal history of nicotine dependence: Secondary | ICD-10-CM | POA: Diagnosis not present

## 2019-02-19 DIAGNOSIS — R0789 Other chest pain: Secondary | ICD-10-CM

## 2019-02-19 DIAGNOSIS — M546 Pain in thoracic spine: Secondary | ICD-10-CM | POA: Insufficient documentation

## 2019-02-19 DIAGNOSIS — R05 Cough: Secondary | ICD-10-CM | POA: Diagnosis not present

## 2019-02-19 DIAGNOSIS — C3412 Malignant neoplasm of upper lobe, left bronchus or lung: Secondary | ICD-10-CM | POA: Diagnosis not present

## 2019-02-19 DIAGNOSIS — R0781 Pleurodynia: Secondary | ICD-10-CM | POA: Diagnosis not present

## 2019-02-19 DIAGNOSIS — R000 Tachycardia, unspecified: Secondary | ICD-10-CM | POA: Diagnosis not present

## 2019-02-19 DIAGNOSIS — J449 Chronic obstructive pulmonary disease, unspecified: Secondary | ICD-10-CM | POA: Diagnosis not present

## 2019-02-19 DIAGNOSIS — R069 Unspecified abnormalities of breathing: Secondary | ICD-10-CM | POA: Diagnosis not present

## 2019-02-19 DIAGNOSIS — R52 Pain, unspecified: Secondary | ICD-10-CM | POA: Diagnosis not present

## 2019-02-19 DIAGNOSIS — I10 Essential (primary) hypertension: Secondary | ICD-10-CM | POA: Diagnosis not present

## 2019-02-19 MED ORDER — NAPROXEN 375 MG PO TABS: 375.0000 mg | ORAL_TABLET | Freq: Two times a day (BID) | ORAL | 0 refills | Status: DC

## 2019-02-19 NOTE — Patient Instructions (Signed)
EMS to ER

## 2019-02-19 NOTE — ED Triage Notes (Signed)
Patient reports to the ED for coughing. Patient last had chemo August 14th. Patient reports she has been coughing so hard that she "thinks she may have cracked a rib"

## 2019-02-19 NOTE — Discharge Instructions (Addendum)
Chest x-ray shows no collapsed lung.  Prescription for pain medicine sent to your pharmacy.  Return if worse.

## 2019-02-19 NOTE — Progress Notes (Signed)
Virtual Visit via Video Note  I connected with Lori Butler on 02/19/19 at 11:30 AM EDT by a video enabled telemedicine application and verified that I am speaking with the correct person using two identifiers.  Location: Patient: Home   Provider: Office    I discussed the limitations of evaluation and management by telemedicine and the availability of in person appointments. The patient expressed understanding and agreed to proceed.  History of Present Illness: 68 year old female former smoker quit May 2019 followed for COPD with asthma component,  Presumed malignant LUL nodule s/p SBRT 11/2018   Today's video visit is for an acute office office visit. Patient complains of 5 days of left sided rib /chest wall pain. Woke up this morning with severe excruiating pain in left ribs with any movement or deep breaths . Has some coughing and wheezing . No fever, orthopnea or edema . On video visit patient in apparent distress with severe pain and screaming out with any movement . Increased work of breathing noted. Patient is unable to talk without shouting out in pain. Advised her to call 911 for help. Stayed on the visit until EMS arrived. Called daughter in law [name] per patient request to notify of issues.    Observations/Objective:   CT chest 10/14/2018 which I reviewed with her in detail-she had bilateral upper lobe parenchymal scarring on her prior CT and left upper lobe scar that appeared more nodular on the CT measuring about 13 mm  PET scan showed hypermetabolism with SUV 8 in this nodule   PFTs which showed moderate reversible airway obstruction with FEV1 of 37%-0.91 which improved to 47%-1.1L DLCO was about 54%  She had nav bronchoscopy with Dr. [name] on 7/6 with fiducial placement. Pathology and cytology were non-diagnostic with atypical cells no malignant cells.  SBRT 12/03/18 -12/10/2018  CT chest January 24, 2019 showed moderate to advanced emphysema, biapical  scarring, left upper lobe nodule measuring 1.2 cm unchanged from previous study no new nodules noted.  Assessment and Plan: Severe Left sided Chest wall pain ? Etiology  Patient in distress .  Advised to seek emergency care immediately  Remained on visit until EMS arrived on the scene.   Plan  EMS to ER for evaluation   Follow Up Instructions: Follow up with our office in 1 week once out of ER or hospital.     I discussed the assessment and treatment plan with the patient. The patient was provided an opportunity to ask questions and all were answered. The patient agreed with the plan and demonstrated an understanding of the instructions.   The patient was advised to call back or seek an in-person evaluation if the symptoms worsen or if the condition fails to improve as anticipated.  I provided 22 minutes of non-face-to-face time during this encounter.   [name], NP

## 2019-02-19 NOTE — Telephone Encounter (Signed)
Called and spoke to patient. Patient stated she is having left sided rib pain. Patient stated it might be from radiation and feels like it could be broken. Patient would like to do a video visit with a provider and be advised of course of action. Scheduled patient for MyChart video visit. Nothing further needed at this time.

## 2019-02-19 NOTE — ED Provider Notes (Signed)
[facility] DEPT Provider Note   CSN: [id number] Arrival date & time: 02/19/19  1236     History   Chief Complaint Chief Complaint  Patient presents with   Cough   Cancer    HPI Emaan Gary is a 68 y.o. female.     Chief complaint pain in the left lower back.  Pain is worse with coughing and movement.  She is tender to touch in that area.  Past medical history includes neoplasm in the left upper lobe treated with radiation therapy.  No substernal chest pain, fever, cough, chills.  She was seen at a video visit as an outpatient today and sent to the ED.     Past Medical History:  Diagnosis Date   Acute respiratory failure with hypoxia (HCC)    Anxiety    Aortic atherosclerosis ([place])    Arthritis    "a little in my hands probably" (10/31/2017)   COPD (chronic obstructive pulmonary disease) (HCC)    COPD exacerbation (HCC)    Depression    History of kidney stones    Suicide attempt ([place]) 02/21/2018   overdosed on Wellbutrin    Patient Active Problem List   Diagnosis Date Noted   Malignant neoplasm of upper lobe of left lung ([place]) 11/14/2018   Solitary pulmonary nodule on lung CT 10/22/2018   Acute exacerbation of chronic obstructive pulmonary disease ([place]) 05/05/2018   Anxiety and depression 05/05/2018   Thrush, oral 04/15/2018   Major depressive disorder, 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   Attempted suicide ([place]) 02/21/2018   Hypoxia    Physical deconditioning    COPD exacerbation ([place])    Vitamin B12 deficiency 11/10/2017   Osteoporosis 11/10/2017   Vitamin D deficiency 11/10/2017   Tobacco abuse 09/20/2017   COPD (chronic obstructive pulmonary disease) ([place]) 09/15/2017    Past Surgical History:  Procedure Laterality Date   ABDOMINAL HYSTERECTOMY     APPENDECTOMY     BREAST BIOPSY Left 1990s   CATARACT EXTRACTION W/ INTRAOCULAR LENS  IMPLANT, BILATERAL Bilateral      DILATION AND CURETTAGE OF UTERUS     FUDUCIAL PLACEMENT N/A 11/11/2018   Procedure: PLACEMENT OF FUDUCIAL;  Surgeon: Melrose Nakayama, MD;  Location: [facility];  Service: Thoracic;  Laterality: N/A;   TONSILLECTOMY     TUBAL LIGATION     VIDEO BRONCHOSCOPY WITH ENDOBRONCHIAL NAVIGATION N/A 11/11/2018   Procedure: VIDEO BRONCHOSCOPY WITH ENDOBRONCHIAL NAVIGATION;  Surgeon: Melrose Nakayama, MD;  Location: [facility] OR;  Service: Thoracic;  Laterality: N/A;     OB History   No obstetric history on file.      Home Medications    Prior to Admission medications   Medication Sig Start Date End Date Taking? Authorizing Provider  albuterol (PROVENTIL HFA;VENTOLIN HFA) 108 (90 Base) MCG/ACT inhaler Inhale 2 puffs into the lungs every 6 (six) hours as needed for wheezing or shortness of breath. 06/18/18   Rigoberto Noel, MD  albuterol (PROVENTIL) (2.5 MG/3ML) 0.083% nebulizer solution Take 3 mLs (2.5 mg total) by nebulization every 6 (six) hours as needed for wheezing or shortness of breath. 06/21/18   Rigoberto Noel, MD  alendronate (FOSAMAX) 70 MG tablet Take 70 mg by mouth every Wednesday.  07/11/17   [provider]  atorvastatin (LIPITOR) 10 MG tablet Take 10 mg by mouth daily.    [provider]  benzonatate (TESSALON) 200 MG capsule Take 1 capsule (200 mg total) by mouth 3 (three)  times daily as needed for cough. 12/16/18   Martyn Ehrich, NP  budesonide-formoterol [facility]) 160-4.5 MCG/ACT inhaler Inhale 2 puffs into the lungs 2 (two) times daily. 05/28/18   Martyn Ehrich, NP  Cholecalciferol (VITAMIN D) 50 MCG (2000 UT) tablet Take 2,000 Units by mouth daily.    [provider]  doxycycline (VIBRA-TABS) 100 MG tablet Take 1 tablet (100 mg total) by mouth 2 (two) times daily. 02/06/19   Martyn Ehrich, NP  guaiFENesin (MUCINEX) 600 MG 12 hr tablet Take 2 tablets (1,200 mg total) by mouth 2 (two) times daily. 02/06/19   Martyn Ehrich, NP  montelukast  (SINGULAIR) 10 MG tablet Take 1 tablet (10 mg total) by mouth at bedtime. 12/26/17   Arnell Asal, NP  naproxen (NAPROSYN) 375 MG tablet Take 1 tablet (375 mg total) by mouth 2 (two) times daily. 02/19/19   Nat Christen, MD  pantoprazole (PROTONIX) 40 MG tablet Take 1 tablet (40 mg total) by mouth 2 (two) times daily. Patient taking differently: Take 40 mg by mouth daily.  12/26/17   Arnell Asal, NP  predniSONE (DELTASONE) 10 MG tablet 40mg  x 3 days; 30mg  x 3 days; 20mg  x 3 days; then STAY on 10 mg daily until stopped or changed by provider 01/23/19   Martyn Ehrich, NP  promethazine-codeine (PHENERGAN WITH CODEINE) 6.25-10 MG/5ML syrup Take 5 mLs by mouth every 6 (six) hours as needed for cough. 01/23/19   Martyn Ehrich, NP  sertraline (ZOLOFT) 50 MG tablet Take 1 tablet (50 mg total) by mouth daily. 03/01/18   McNew, Tyson Babinski, MD  tiotropium (SPIRIVA HANDIHALER) 18 MCG inhalation capsule Place 1 capsule (18 mcg total) into inhaler and inhale daily. 01/03/18 01/03/19  Fenton Foy, NP    Family History Family History  Problem Relation Age of Onset   Breast cancer Mother     Social History Social History   Tobacco Use   Smoking status: Former Smoker    Packs/day: 1.50    Years: 51.00    Pack years: 76.50    Types: Cigarettes    Quit date: 09/05/2017    Years since quitting: 1.4   Smokeless tobacco: Never Used  Substance Use Topics   Alcohol use: Not Currently    Comment: 02/21/2018 "couple drinks/year"   Drug use: Never     Allergies   Demerol [meperidine], Oxycodone-acetaminophen, and Tramadol   Review of Systems Review of Systems  All other systems reviewed and are negative.    Physical Exam Updated Vital Signs BP 124/84    Pulse 94    Temp 98.2 F (36.8 C) (Oral)    Resp 18    SpO2 94%   Physical Exam Vitals signs and nursing note reviewed.  Constitutional:      Appearance: She is well-developed.     Comments: No pain with sitting still;  tachycardic  HENT:     Head: Normocephalic and atraumatic.  Eyes:     Conjunctiva/sclera: Conjunctivae normal.  Neck:     Musculoskeletal: Neck supple.  Cardiovascular:     Rate and Rhythm: Normal rate and regular rhythm.  Pulmonary:     Effort: Pulmonary effort is normal.     Breath sounds: Normal breath sounds.  Abdominal:     General: Bowel sounds are normal.     Palpations: Abdomen is soft.  Musculoskeletal: Normal range of motion.     Comments: Left lateral mid back approximately T7-8 level: Extremely tender to  palpation.  No masses or lesions noted.  Skin:    General: Skin is warm and dry.  Neurological:     Mental Status: She is alert and oriented to person, place, and time.  Psychiatric:        Behavior: Behavior normal.      ED Treatments / Results  Labs (all labs ordered are listed, but only abnormal results are displayed) Labs Reviewed - No data to display  EKG None  Radiology Dg Chest 2 View  Result Date: 02/19/2019 CLINICAL DATA:  Patient reports to the ED for coughing. Patient last had chemo August 14th. Patient reports she has been coughing so hard that she "thinks she may have cracked a rib" hx copd, lung cancer, ex-smoker EXAM: CHEST - 2 VIEW COMPARISON:  11/11/2018 FINDINGS: Cardiac silhouette is normal in size. No mediastinal or hilar masses. No evidence of adenopathy. Three surgical vascular clips project above and lateral to the left hilum new since the prior radiographs, but stable from a CT dated 01/24/2019. There is opacity extending above the left hilum in the medial left upper lobe that is also unchanged from the prior CT. Lungs are hyperexpanded but otherwise clear. No pleural effusion.  No pneumothorax. Skeletal structures are intact.  No visualized rib fracture. IMPRESSION: 1. No acute cardiopulmonary disease. 2. Left upper lobe opacity medially, above the left hilum, consistent with scarring from treatment of prior lung carcinoma. Active neoplastic  disease is not excluded. 3. Lung hyperexpansion consistent with COPD. Electronically Signed   By: Lajean Manes M.D.   On: 02/19/2019 13:21    Procedures Procedures (including critical care time)  Medications Ordered in ED Medications - No data to display   Initial Impression / Assessment and Plan / ED Course  I have reviewed the triage vital signs and the nursing notes.  Pertinent labs & imaging results that were available during my care of the patient were reviewed by me and considered in my medical decision making (see chart for details).        History and physical most consistent with musculoskeletal pain.  Slight tachycardia noted, but history not consistent with pulmonary embolism.  Chest x-ray negative for pneumothorax or pneumonia.  Pain medication offered in the ED.  Patient refused.  Will Rx Naprosyn 375 mg as an outpatient.  Final Clinical Impressions(s) / ED Diagnoses   Final diagnoses:  Left-sided thoracic back pain, unspecified chronicity    ED Discharge Orders         Ordered    naproxen (NAPROSYN) 375 MG tablet  2 times daily     02/19/19 1644           Nat Christen, MD 02/19/19 1653

## 2019-02-28 ENCOUNTER — Emergency Department (HOSPITAL_COMMUNITY): Payer: Medicare HMO

## 2019-02-28 ENCOUNTER — Emergency Department (HOSPITAL_COMMUNITY)
Admission: EM | Admit: 2019-02-28 | Discharge: 2019-02-28 | Disposition: A | Discharge status: Home / Self Care | Payer: Medicare HMO | Source: Home / Self Care | Attending: Emergency Medicine | Admitting: Emergency Medicine

## 2019-02-28 ENCOUNTER — Other Ambulatory Visit: Payer: Self-pay

## 2019-02-28 ENCOUNTER — Inpatient Hospital Stay (HOSPITAL_COMMUNITY)
Admission: EM | Admit: 2019-02-28 | Discharge: 2019-03-03 | DRG: 190 | Disposition: A | Discharge status: Home Health | Payer: Medicare HMO | Attending: Internal Medicine | Admitting: Internal Medicine

## 2019-02-28 ENCOUNTER — Telehealth: Payer: Self-pay | Admitting: Pulmonary Disease

## 2019-02-28 ENCOUNTER — Encounter (HOSPITAL_COMMUNITY): Payer: Self-pay

## 2019-02-28 DIAGNOSIS — E559 Vitamin D deficiency, unspecified: Secondary | ICD-10-CM | POA: Diagnosis present

## 2019-02-28 DIAGNOSIS — E785 Hyperlipidemia, unspecified: Secondary | ICD-10-CM | POA: Diagnosis not present

## 2019-02-28 DIAGNOSIS — F332 Major depressive disorder, recurrent severe without psychotic features: Secondary | ICD-10-CM | POA: Diagnosis present

## 2019-02-28 DIAGNOSIS — Z87442 Personal history of urinary calculi: Secondary | ICD-10-CM | POA: Diagnosis not present

## 2019-02-28 DIAGNOSIS — J441 Chronic obstructive pulmonary disease with (acute) exacerbation: Principal | ICD-10-CM | POA: Diagnosis present

## 2019-02-28 DIAGNOSIS — Z90710 Acquired absence of both cervix and uterus: Secondary | ICD-10-CM | POA: Diagnosis not present

## 2019-02-28 DIAGNOSIS — Z87891 Personal history of nicotine dependence: Secondary | ICD-10-CM

## 2019-02-28 DIAGNOSIS — J9601 Acute respiratory failure with hypoxia: Secondary | ICD-10-CM | POA: Diagnosis not present

## 2019-02-28 DIAGNOSIS — R0902 Hypoxemia: Secondary | ICD-10-CM | POA: Diagnosis present

## 2019-02-28 DIAGNOSIS — Z791 Long term (current) use of non-steroidal anti-inflammatories (NSAID): Secondary | ICD-10-CM

## 2019-02-28 DIAGNOSIS — R609 Edema, unspecified: Secondary | ICD-10-CM | POA: Diagnosis not present

## 2019-02-28 DIAGNOSIS — L03115 Cellulitis of right lower limb: Secondary | ICD-10-CM | POA: Diagnosis not present

## 2019-02-28 DIAGNOSIS — Z923 Personal history of irradiation: Secondary | ICD-10-CM | POA: Diagnosis not present

## 2019-02-28 DIAGNOSIS — F329 Major depressive disorder, single episode, unspecified: Secondary | ICD-10-CM | POA: Diagnosis not present

## 2019-02-28 DIAGNOSIS — R0602 Shortness of breath: Secondary | ICD-10-CM | POA: Diagnosis not present

## 2019-02-28 DIAGNOSIS — Z915 Personal history of self-harm: Secondary | ICD-10-CM | POA: Diagnosis not present

## 2019-02-28 DIAGNOSIS — M810 Age-related osteoporosis without current pathological fracture: Secondary | ICD-10-CM | POA: Diagnosis present

## 2019-02-28 DIAGNOSIS — Z20828 Contact with and (suspected) exposure to other viral communicable diseases: Secondary | ICD-10-CM | POA: Diagnosis not present

## 2019-02-28 DIAGNOSIS — I700 Atherosclerosis of aorta: Secondary | ICD-10-CM | POA: Diagnosis present

## 2019-02-28 DIAGNOSIS — Z9114 Patient's other noncompliance with medication regimen: Secondary | ICD-10-CM | POA: Diagnosis not present

## 2019-02-28 DIAGNOSIS — Z885 Allergy status to narcotic agent status: Secondary | ICD-10-CM | POA: Diagnosis not present

## 2019-02-28 DIAGNOSIS — F419 Anxiety disorder, unspecified: Secondary | ICD-10-CM | POA: Diagnosis not present

## 2019-02-28 DIAGNOSIS — Z7983 Long term (current) use of bisphosphonates: Secondary | ICD-10-CM | POA: Diagnosis not present

## 2019-02-28 DIAGNOSIS — C3412 Malignant neoplasm of upper lobe, left bronchus or lung: Secondary | ICD-10-CM | POA: Diagnosis present

## 2019-02-28 DIAGNOSIS — Z7951 Long term (current) use of inhaled steroids: Secondary | ICD-10-CM

## 2019-02-28 DIAGNOSIS — R000 Tachycardia, unspecified: Secondary | ICD-10-CM | POA: Diagnosis not present

## 2019-02-28 DIAGNOSIS — J439 Emphysema, unspecified: Secondary | ICD-10-CM | POA: Diagnosis not present

## 2019-02-28 DIAGNOSIS — F32A Depression, unspecified: Secondary | ICD-10-CM | POA: Diagnosis present

## 2019-02-28 DIAGNOSIS — R5381 Other malaise: Secondary | ICD-10-CM | POA: Diagnosis not present

## 2019-02-28 DIAGNOSIS — Z79899 Other long term (current) drug therapy: Secondary | ICD-10-CM

## 2019-02-28 DIAGNOSIS — R0689 Other abnormalities of breathing: Secondary | ICD-10-CM | POA: Diagnosis not present

## 2019-02-28 DIAGNOSIS — R05 Cough: Secondary | ICD-10-CM | POA: Diagnosis not present

## 2019-02-28 HISTORY — DX: Cellulitis of right lower limb: L03.115

## 2019-02-28 LAB — CBC WITH DIFFERENTIAL/PLATELET
Abs Immature Granulocytes: 0.05 10*3/uL (ref 0.00–0.07)
Basophils Absolute: 0.1 10*3/uL (ref 0.0–0.1)
Basophils Relative: 1 %
Eosinophils Absolute: 0.5 10*3/uL (ref 0.0–0.5)
Eosinophils Relative: 5 %
HCT: 40.3 % (ref 36.0–46.0)
Hemoglobin: 12.4 g/dL (ref 12.0–15.0)
Immature Granulocytes: 1 %
Lymphocytes Relative: 19 %
Lymphs Abs: 1.7 10*3/uL (ref 0.7–4.0)
MCH: 28.6 pg (ref 26.0–34.0)
MCHC: 30.8 g/dL (ref 30.0–36.0)
MCV: 93.1 fL (ref 80.0–100.0)
Monocytes Absolute: 0.8 10*3/uL (ref 0.1–1.0)
Monocytes Relative: 9 %
Neutro Abs: 6.1 10*3/uL (ref 1.7–7.7)
Neutrophils Relative %: 65 %
Platelets: 338 10*3/uL (ref 150–400)
RBC: 4.33 MIL/uL (ref 3.87–5.11)
RDW: 13.7 % (ref 11.5–15.5)
WBC: 9.2 10*3/uL (ref 4.0–10.5)
nRBC: 0 % (ref 0.0–0.2)

## 2019-02-28 LAB — BRAIN NATRIURETIC PEPTIDE: B Natriuretic Peptide: 32.7 pg/mL (ref 0.0–100.0)

## 2019-02-28 LAB — COMPREHENSIVE METABOLIC PANEL
ALT: 39 U/L (ref 0–44)
AST: 33 U/L (ref 15–41)
Albumin: 3.5 g/dL (ref 3.5–5.0)
Alkaline Phosphatase: 161 U/L — ABNORMAL HIGH (ref 38–126)
Anion gap: 8 (ref 5–15)
BUN: 14 mg/dL (ref 8–23)
CO2: 24 mmol/L (ref 22–32)
Calcium: 8.5 mg/dL — ABNORMAL LOW (ref 8.9–10.3)
Chloride: 108 mmol/L (ref 98–111)
Creatinine, Ser: 0.8 mg/dL (ref 0.44–1.00)
GFR calc Af Amer: >60 mL/min (ref >60)
GFR calc non Af Amer: >60 mL/min (ref >60)
Glucose, Bld: 83 mg/dL (ref 70–99)
Potassium: 4.1 mmol/L (ref 3.5–5.1)
Sodium: 140 mmol/L (ref 135–145)
Total Bilirubin: 0.5 mg/dL (ref 0.3–1.2)
Total Protein: 6.6 g/dL (ref 6.5–8.1)

## 2019-02-28 LAB — TROPONIN I (HIGH SENSITIVITY): Troponin I (High Sensitivity): 8 ng/L (ref <18)

## 2019-02-28 LAB — LACTATE DEHYDROGENASE: LDH: 215 U/L — ABNORMAL HIGH (ref 98–192)

## 2019-02-28 LAB — MRSA PCR SCREENING: MRSA by PCR: NEGATIVE

## 2019-02-28 LAB — LACTIC ACID, PLASMA: Lactic Acid, Venous: 0.9 mmol/L (ref 0.5–1.9)

## 2019-02-28 LAB — PROCALCITONIN: Procalcitonin: <0.1 ng/mL

## 2019-02-28 MED ORDER — IOHEXOL 350 MG/ML SOLN
100.0000 mL | Freq: Once | INTRAVENOUS | Status: AC | PRN
  Administered 2019-02-28: 100 mL via INTRAVENOUS

## 2019-02-28 MED ORDER — CLINDAMYCIN PHOSPHATE 600 MG/50ML IV SOLN
600.0000 mg | Freq: Once | INTRAVENOUS | Status: AC
  Administered 2019-02-28: 600 mg via INTRAVENOUS
  Filled 2019-02-28: qty 50

## 2019-02-28 MED ORDER — SODIUM CHLORIDE (PF) 0.9 % IJ SOLN
INTRAMUSCULAR | Status: AC
  Administered 2019-02-28: 10 mL
  Filled 2019-02-28: qty 50

## 2019-02-28 MED ORDER — DEXAMETHASONE SODIUM PHOSPHATE 10 MG/ML IJ SOLN
10.0000 mg | Freq: Once | INTRAMUSCULAR | Status: AC
  Administered 2019-02-28: 18:00:00 10 mg via INTRAVENOUS
  Filled 2019-02-28: qty 1

## 2019-02-28 MED ORDER — SODIUM CHLORIDE 0.9 % IV SOLN
2.0000 g | INTRAVENOUS | Status: DC
  Administered 2019-02-28 – 2019-03-02 (×3): 2 g via INTRAVENOUS
  Filled 2019-02-28 (×2): qty 20
  Filled 2019-02-28 (×2): qty 2

## 2019-02-28 MED ORDER — ALBUTEROL SULFATE HFA 108 (90 BASE) MCG/ACT IN AERS
2.0000 | INHALATION_SPRAY | Freq: Once | RESPIRATORY_TRACT | Status: AC
  Administered 2019-02-28: 2 via RESPIRATORY_TRACT
  Filled 2019-02-28: qty 6.7

## 2019-02-28 NOTE — ED Notes (Signed)
Pt has Rt foot swelling, area is red and warm. + Pedal faint

## 2019-02-28 NOTE — Progress Notes (Signed)
Right lower extremity venous duplex has been completed. Preliminary results can be found in CV Proc through chart review.  Results were given to Martinique Robinson PA.  02/28/19 3:32 PM Carlos Levering RVT

## 2019-02-28 NOTE — Telephone Encounter (Signed)
Dr. Elsworth Soho, the information below has been sent as an FYI:  I called back and spoke with Marzetta Board (daugther in law) she stated she was not with the patient as she is at home with her 3 children and the patient's husband is at work. However the patient contacted her to let her know that [name] sending her to the ER (according to Epic patient arrived at 2:47)  Marzetta Board stated that since her video visit (with [name], NP on 02/19/19) she does not think the patient has followed up since that time as her breathing has gotten worse. Feels the patient may have given up as she has been dealing with depression.  Marzetta Board called because she wanted to let Dr. Elsworth Soho know what was going on with the patient.

## 2019-02-28 NOTE — Telephone Encounter (Signed)
Noted. RA has been made aware of pt's FYI. Nothing further needed at this time.

## 2019-02-28 NOTE — ED Triage Notes (Signed)
Pt BIB by EMS from PCP office pt has been having increasing SOB , with productive cough past 2 days and is DOE at baseline, Pt reports increasing DOE with position changes. Pt has HX of COPD , Lung  Ca. 4lpm via [place] a2 98%, jwhen taking off O2 saturations dropped to 93%.  Last Chemo on 12/20/18. Rt foot swelling , and was started on naproxen per pt,      20G Rt AC EMS 130/70, HR 104, RR 28, O2 sat 98%

## 2019-02-28 NOTE — Telephone Encounter (Signed)
Okay-we will let ED evaluate her-if needed ED doc can call pulmonary consult

## 2019-02-28 NOTE — ED Provider Notes (Signed)
[facility] DEPT Provider Note   CSN: [id number] Arrival date & time: 02/28/19  1347     History   Chief Complaint Chief Complaint  Patient presents with   Shortness of Breath   Foot Swelling    HPI Lori Butler is a 68 y.o. female.     HPI Patient with multiple medical issues including COPD, lung cancer presents with dyspnea, cough, and new lower extremity swelling. She does have some degree of dyspnea, though it seems worse over the past days without clear precipitant. She also has ongoing cough, typically, this is unclear if it is changed. No new chest pain, though there is some consistent discomfort. Patient presents largely due to ongoing dyspnea, as well as hypoxia with exertion, at home, with a saturation of less than 90% according to the patient.  Notably, the patient also has new right lower extremity swelling and erythema.  On she has no history of similar changes, cellulitis, DVT. Past Medical History:  Diagnosis Date   Acute respiratory failure with hypoxia (HCC)    Anxiety    Aortic atherosclerosis ([place])    Arthritis    "a little in my hands probably" (10/31/2017)   COPD (chronic obstructive pulmonary disease) (HCC)    COPD exacerbation (HCC)    Depression    History of kidney stones    Suicide attempt ([place]) 02/21/2018   overdosed on Wellbutrin    Patient Active Problem List   Diagnosis Date Noted   Malignant neoplasm of upper lobe of left lung ([place]) 11/14/2018   Solitary pulmonary nodule on lung CT 10/22/2018   Acute exacerbation of chronic obstructive pulmonary disease ([place]) 05/05/2018   Anxiety and depression 05/05/2018   Thrush, oral 04/15/2018   Major depressive disorder, 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   Attempted suicide ([place]) 02/21/2018   Hypoxia    Physical deconditioning    COPD exacerbation ([place])    Vitamin B12 deficiency 11/10/2017   Osteoporosis  11/10/2017   Vitamin D deficiency 11/10/2017   Tobacco abuse 09/20/2017   COPD (chronic obstructive pulmonary disease) ([place]) 09/15/2017    Past Surgical History:  Procedure Laterality Date   ABDOMINAL HYSTERECTOMY     APPENDECTOMY     BREAST BIOPSY Left 1990s   CATARACT EXTRACTION W/ INTRAOCULAR LENS  IMPLANT, BILATERAL Bilateral    DILATION AND CURETTAGE OF UTERUS     FUDUCIAL PLACEMENT N/A 11/11/2018   Procedure: PLACEMENT OF FUDUCIAL;  Surgeon: Melrose Nakayama, MD;  Location: [facility];  Service: Thoracic;  Laterality: N/A;   TONSILLECTOMY     TUBAL LIGATION     VIDEO BRONCHOSCOPY WITH ENDOBRONCHIAL NAVIGATION N/A 11/11/2018   Procedure: VIDEO BRONCHOSCOPY WITH ENDOBRONCHIAL NAVIGATION;  Surgeon: Melrose Nakayama, MD;  Location: [facility] OR;  Service: Thoracic;  Laterality: N/A;     OB History   No obstetric history on file.      Home Medications    Prior to Admission medications   Medication Sig Start Date End Date Taking? Authorizing Provider  albuterol (PROVENTIL HFA;VENTOLIN HFA) 108 (90 Base) MCG/ACT inhaler Inhale 2 puffs into the lungs every 6 (six) hours as needed for wheezing or shortness of breath. 06/18/18   Rigoberto Noel, MD  albuterol (PROVENTIL) (2.5 MG/3ML) 0.083% nebulizer solution Take 3 mLs (2.5 mg total) by nebulization every 6 (six) hours as needed for wheezing or shortness of breath. 06/21/18   Rigoberto Noel, MD  alendronate (FOSAMAX) 70 MG tablet Take 70 mg by mouth  every Wednesday.  07/11/17   [provider]  atorvastatin (LIPITOR) 10 MG tablet Take 10 mg by mouth daily.    [provider]  benzonatate (TESSALON) 200 MG capsule Take 1 capsule (200 mg total) by mouth 3 (three) times daily as needed for cough. 12/16/18   Martyn Ehrich, NP  budesonide-formoterol [facility]) 160-4.5 MCG/ACT inhaler Inhale 2 puffs into the lungs 2 (two) times daily. 05/28/18   Martyn Ehrich, NP  Cholecalciferol (VITAMIN D) 50 MCG (2000 UT)  tablet Take 2,000 Units by mouth daily.    [provider]  doxycycline (VIBRA-TABS) 100 MG tablet Take 1 tablet (100 mg total) by mouth 2 (two) times daily. 02/06/19   Martyn Ehrich, NP  guaiFENesin (MUCINEX) 600 MG 12 hr tablet Take 2 tablets (1,200 mg total) by mouth 2 (two) times daily. 02/06/19   Martyn Ehrich, NP  montelukast (SINGULAIR) 10 MG tablet Take 1 tablet (10 mg total) by mouth at bedtime. 12/26/17   Arnell Asal, NP  naproxen (NAPROSYN) 375 MG tablet Take 1 tablet (375 mg total) by mouth 2 (two) times daily. 02/19/19   Nat Christen, MD  pantoprazole (PROTONIX) 40 MG tablet Take 1 tablet (40 mg total) by mouth 2 (two) times daily. Patient taking differently: Take 40 mg by mouth daily.  12/26/17   Arnell Asal, NP  predniSONE (DELTASONE) 10 MG tablet 40mg  x 3 days; 30mg  x 3 days; 20mg  x 3 days; then STAY on 10 mg daily until stopped or changed by provider 01/23/19   Martyn Ehrich, NP  promethazine-codeine (PHENERGAN WITH CODEINE) 6.25-10 MG/5ML syrup Take 5 mLs by mouth every 6 (six) hours as needed for cough. 01/23/19   Martyn Ehrich, NP  sertraline (ZOLOFT) 50 MG tablet Take 1 tablet (50 mg total) by mouth daily. 03/01/18   McNew, Tyson Babinski, MD  tiotropium (SPIRIVA HANDIHALER) 18 MCG inhalation capsule Place 1 capsule (18 mcg total) into inhaler and inhale daily. 01/03/18 01/03/19  Fenton Foy, NP    Family History Family History  Problem Relation Age of Onset   Breast cancer Mother     Social History Social History   Tobacco Use   Smoking status: Former Smoker    Packs/day: 1.50    Years: 51.00    Pack years: 76.50    Types: Cigarettes    Quit date: 09/05/2017    Years since quitting: 1.4   Smokeless tobacco: Never Used  Substance Use Topics   Alcohol use: Not Currently    Comment: 02/21/2018 "couple drinks/year"   Drug use: Never     Allergies   Demerol [meperidine], Oxycodone-acetaminophen, and Tramadol   Review of  Systems Review of Systems  Constitutional:       Per HPI, otherwise negative  HENT:       Per HPI, otherwise negative  Respiratory:       Per HPI, otherwise negative  Cardiovascular:       Per HPI, otherwise negative  Gastrointestinal: Negative for vomiting.  Endocrine:       Negative aside from HPI  Genitourinary:       Neg aside from HPI   Musculoskeletal:       Per HPI, otherwise negative  Skin: Positive for color change.  Allergic/Immunologic: Positive for immunocompromised state.  Neurological: Positive for weakness. Negative for syncope.     Physical Exam Updated Vital Signs BP 137/73 (BP Location: Right Arm)    Pulse (!) 103  Temp 97.7 F (36.5 C) (Oral)    Resp 17    SpO2 97%   Physical Exam Vitals signs and nursing note reviewed.  Constitutional:      Appearance: She is well-developed. She is ill-appearing.  HENT:     Head: Normocephalic and atraumatic.  Eyes:     Conjunctiva/sclera: Conjunctivae normal.  Cardiovascular:     Rate and Rhythm: Regular rhythm. Tachycardia present.  Pulmonary:     Effort: Tachypnea present.     Breath sounds: Decreased breath sounds and wheezing present.  Abdominal:     General: There is no distension.  Musculoskeletal:     Right lower leg: Edema present.     Comments: Dorsal surface of the right foot is edematous, erythematous, slightly warm.  There is proximal edema, there are no additional erythema.  Skin:    General: Skin is warm and dry.     Findings: Erythema present.  Neurological:     Mental Status: She is alert and oriented to person, place, and time.     Cranial Nerves: No cranial nerve deficit.      ED Treatments / Results  Labs (all labs ordered are listed, but only abnormal results are displayed) Labs Reviewed  COMPREHENSIVE METABOLIC PANEL - Abnormal; Notable for the following components:      Result Value   Calcium 8.5 (*)    Alkaline Phosphatase 161 (*)    All other components within normal limits   SARS CORONAVIRUS 2 (TAT 6-24 HRS)  RESPIRATORY PANEL BY PCR  MRSA PCR SCREENING  CBC WITH DIFFERENTIAL/PLATELET  LACTIC ACID, PLASMA  LACTIC ACID, PLASMA  BRAIN NATRIURETIC PEPTIDE  PROCALCITONIN  LACTATE DEHYDROGENASE  TROPONIN I (HIGH SENSITIVITY)  TROPONIN I (HIGH SENSITIVITY)    EKG Sinus tachycardia, rate 107, wander and artifact, no overt ischemic changes, abnormal EKG  Radiology Ct Angio Chest Pe W/cm &/or Wo Cm  Result Date: 02/28/2019 CLINICAL DATA:  CONTRAST-100ML OMNI 350 Pt BIB by EMS from PCP office pt has been having increasing SOB , with productive cough past 2 days and is DOE at baseline, Pt reports increasing DOE with position changes. Pt has HX of COPD , Lung Chest pain, complex, intermediate/high prob of ACS/PE/AAS CA pt w R LE swelling and dyspnea w hypoxia w exertion EXAM: CT ANGIOGRAPHY CHEST WITH CONTRAST TECHNIQUE: Multidetector CT imaging of the chest was performed using the standard protocol during bolus administration of intravenous contrast. Multiplanar CT image reconstructions and MIPs were obtained to evaluate the vascular anatomy. CONTRAST:  1107mL OMNIPAQUE IOHEXOL 350 MG/ML SOLN COMPARISON:  Chest CT 01/14/2019 FINDINGS: Cardiovascular: No filling defects within the pulmonary arteries to suggest acute pulmonary embolism. No acute findings of the aorta or great vessels. No pericardial fluid. Mediastinum/Nodes: No axillary supraclavicular adenopathy. No mediastinal hilar adenopathy. No pericardial effusion. Esophagus normal. . Lungs/Pleura: Centrilobular emphysema the upper lobes. Angular nodule in the LEFT upper lobe measuring 1.3 by 0.8 cm compares to 1.4 by 0.9 cm on comparison recent exam remeasured. Lesion was hypermetabolic on comparison PET-CT scan of 10/18/2018. Upper Abdomen: Limited view of the liver, kidneys, pancreas are unremarkable. Normal adrenal glands. Musculoskeletal: No aggressive osseous lesion. Review of the MIP images confirms the above  findings. IMPRESSION: 1. No evidence acute pulmonary embolism. 2. Spiculated nodule in the LEFT upper lobe hypermetabolic on comparison PET-CT scan and concerning for bronchogenic carcinoma. 3. Emphysema Electronically Signed   By: Suzy Bouchard M.D.   On: 02/28/2019 17:05   Vas [place] Lower Extremity Venous (  dvt) (only Mc & Wl 7a-7p)  Result Date: 02/28/2019  Lower Venous Study Indications: Edema.  Risk Factors: None identified. Limitations: Body habitus and poor ultrasound/tissue interface. Comparison Study: No prior studies. Performing Technologist: [name] Hum RVT  Examination Guidelines: A complete evaluation includes B-mode imaging, spectral Doppler, color Doppler, and power Doppler as needed of all accessible portions of each vessel. Bilateral testing is considered an integral part of a complete examination. Limited examinations for reoccurring indications may be performed as noted.  +---------+---------------+---------+-----------+----------+--------------+  RIGHT     Compressibility Phasicity Spontaneity Properties Thrombus Aging  +---------+---------------+---------+-----------+----------+--------------+  CFV       Full            Yes       Yes                                    +---------+---------------+---------+-----------+----------+--------------+  SFJ       Full                                                             +---------+---------------+---------+-----------+----------+--------------+  FV Prox                   Yes       Yes                                    +---------+---------------+---------+-----------+----------+--------------+  FV Mid    Full                                                             +---------+---------------+---------+-----------+----------+--------------+  FV Distal Full                                                             +---------+---------------+---------+-----------+----------+--------------+  PFV       Full                                                              +---------+---------------+---------+-----------+----------+--------------+  POP       Full            Yes       Yes                                    +---------+---------------+---------+-----------+----------+--------------+  PTV       Full                                                             +---------+---------------+---------+-----------+----------+--------------+  PERO      Full                                                             +---------+---------------+---------+-----------+----------+--------------+   +----+---------------+---------+-----------+----------+--------------+  LEFT Compressibility Phasicity Spontaneity Properties Thrombus Aging  +----+---------------+---------+-----------+----------+--------------+  CFV  Full            Yes       Yes                                    +----+---------------+---------+-----------+----------+--------------+     Summary: Right: There is no evidence of deep vein thrombosis in the lower extremity. However, portions of this examination were limited- see technologist comments above. No cystic structure found in the popliteal fossa. Left: No evidence of common femoral vein obstruction.  *See table(s) above for measurements and observations.    Preliminary     Procedures Procedures (including critical care time)  Medications Ordered in ED Medications - No data to display   Initial Impression / Assessment and Plan / ED Course  I have reviewed the triage vital signs and the nursing notes.  Pertinent labs & imaging results that were available during my care of the patient were reviewed by me and considered in my medical decision making (see chart for details).  On repeat exam the patient is better.  On repeat exam the patient continues to have dyspnea, had mild hypoxia, does not wear oxygen at home, but continues to require it for appropriate saturation for We reviewed findings, now complete, with no evidence for  DVT, no evidence for pulmonary embolism. However, physical exam notable for cellulitis, and given persistent work of breathing that is increased, new oxygen requirement, and this concern for both COPD exacerbation and cellulitis. Patient received initial antibiotics, steroids, albuterol, Covid test is pending, and she is admitted for further monitoring, management.  Final Clinical Impressions(s) / ED Diagnoses  Cellulitis Copd exacerbation   Carmin Muskrat, MD 02/28/19 2248

## 2019-02-28 NOTE — H&P (Signed)
Lori Butler LKJ:[id number] DOB: [birth year] DOA: 02/28/2019     PCP: [name], MD   Outpatient Specialists:     rad/onc Pulmonary  Dr. Elsworth Soho    Patient arrived to ER on 02/28/19 at 1347  Patient coming from: home Lives alone,         Chief Complaint  Chief Complaint  Patient presents with  . Shortness of Breath  . Foot Swelling    HPI: Lori Butler is a 68 y.o. female with medical history significant of COPD, malignant neoplasm upper lobe left lung, hypoxia, anxiety/depression, attempted suicide  former smoker quit May 2019 (74 pack year hx).   Presented with increasing shortness of breath productive cough for past 2 days he is on 4 L satting 98% when took off oxygen oxygen saturation dropped to 85% noted right foot swelling she was using naproxen for pain.  Patient was evaluated her primary care provider noted to be tachycardic and was sent to emergency department to evaluate for PE She had video  office visits with Dr. Elsworth Soho on  14 October Time she was complaining of chest pain Chest pain more significant fatigue cough occasional wheezing low-grade fever up to 99.9 she continues to have normal smell and taste sensations. Reports no known sick contacts although she has been to emergency department lately.  She has been trying to isolate herself at home.  Right foot swelling and pain  Infectious risk factors:  Reports   shortness of breath,  Cough, fever In  ER RAPID COVID TEST  in house testing  Pending  Lab Results  Component Value Date   [place] NEGATIVE 11/07/2018   [place] NOT DETECTED 10/16/2018     Regarding pertinent Chronic problems:  Non-small cell lung CA last chemotherapy was in August Is post radiation therapy  Hyperlipidemia -  on statins lipitor     COPD -  followed by pulmonology  Not on baseline oxygen       While in ER: Further work-up showed no evidence of PE Dopplers with no evidence of DVT Albuterol MDi Diagnosed  right  lower extremity cellulitis and given a dose of clindamycin The following Work up has been ordered so far:  Orders Placed This Encounter  Procedures  . SARS CORONAVIRUS 2 (TAT 6-24 HRS) Nasopharyngeal Nasopharyngeal Swab  . CT Angio Chest PE W/Cm &/Or Wo Cm  . Comprehensive metabolic panel  . CBC with Differential  . Lactic acid, plasma  . Consult to hospitalist  ALL PATIENTS BEING ADMITTED/HAVING PROCEDURES NEED COVID-19 SCREENING  . VAS [place] LOWER EXTREMITY VENOUS (DVT) (ONLY [facility] & [facility] 7a-7p)     Following Medications were ordered in ER: Medications  clindamycin (CLEOCIN) IVPB 600 mg (0 mg Intravenous Stopped 02/28/19 1834)  sodium chloride (PF) 0.9 % injection (10 mLs  Given 02/28/19 1828)  iohexol (OMNIPAQUE) 350 MG/ML injection 100 mL (100 mLs Intravenous Contrast Given 02/28/19 1640)  dexamethasone (DECADRON) injection 10 mg (10 mg Intravenous Given 02/28/19 1828)  albuterol (VENTOLIN HFA) 108 (90 Base) MCG/ACT inhaler 2 puff (2 puffs Inhalation Given 02/28/19 1828)        Consult Orders  (From admission, onward)         Start     Ordered   02/28/19 2006  Consult to hospitalist  ALL PATIENTS BEING ADMITTED/HAVING PROCEDURES NEED COVID-19 SCREENING  Once    Comments: ALL PATIENTS BEING ADMITTED/HAVING PROCEDURES NEED COVID-19 SCREENING  Provider:  (Not yet assigned)  Question Answer Comment  Place call to:  [facility]   Reason for Consult Admit      02/28/19 2005          Significant initial  Findings: Abnormal Labs Reviewed  COMPREHENSIVE METABOLIC PANEL - Abnormal; Notable for the following components:      Result Value   Calcium 8.5 (*)    Alkaline Phosphatase 161 (*)    All other components within normal limits    Otherwise labs showing:    Recent Labs  Lab 02/28/19 1441  NA 140  K 4.1  CO2 24  GLUCOSE 83  BUN 14  CREATININE 0.80  CALCIUM 8.5*    Cr    stable,   Lab Results  Component Value Date   CREATININE 0.80 02/28/2019   CREATININE  1.00 01/24/2019   CREATININE 0.86 12/10/2018    Recent Labs  Lab 02/28/19 1441  AST 33  ALT 39  ALKPHOS 161*  BILITOT 0.5  PROT 6.6  ALBUMIN 3.5   Lab Results  Component Value Date   CALCIUM 8.5 (L) 02/28/2019   PHOS 2.3 (L) 12/25/2017      WBC      Component Value Date/Time   WBC 9.2 02/28/2019 1441   ANC    Component Value Date/Time   NEUTROABS 6.1 02/28/2019 1441    Plt: Lab Results  Component Value Date   PLT 338 02/28/2019    Lactic Acid, Venous    Component Value Date/Time   LATICACIDVEN 0.9 02/28/2019 1441    Procalcitonin   Ordered   COVID-19 Labs  No results for input(s): DDIMER, FERRITIN, LDH, CRP in the last 72 hours.  Lab Results  Component Value Date   SARSCOV2NAA NEGATIVE 11/07/2018   SARSCOV2NAA NOT DETECTED 10/16/2018       HG/HCT  Stable     Component Value Date/Time   HGB 12.4 02/28/2019 1441   HCT 40.3 02/28/2019 1441    Troponin ordered     ECG: Ordered    BNP (last 3 results) No results for input(s): BNP in the last 8760 hours.  ProBNP (last 3 results) No results for input(s): PROBNP in the last 8760 hours.        Ordered    CTA chest - nonacute, no PE, known lung ca     ED Triage Vitals [02/28/19 1426]  Enc Vitals Group     BP 137/73     Pulse Rate (!) 103     Resp 17     Temp 97.7 F (36.5 C)     Temp Source Oral     SpO2 97 %     Weight      Height      Head Circumference      Peak Flow      Pain Score      Pain Loc      Pain Edu?      Excl. in [place]?   VPXT(06)@       Latest  Blood pressure 112/65, pulse 100, temperature 97.8 F (36.6 C), temperature source Oral, resp. rate 19, SpO2 (!) 88 %.    Hospitalist was called for admission for likely COPD exacerbation acute hypoxia   Review of Systems:    Pertinent positives include:  Fevers, chills, dyspnea on exertion, shortness of breath at rest.non-productive cough,   Wheezing.  Constitutional:  No weight loss, night sweats fatigue, weight  loss  HEENT:  No headaches, Difficulty swallowing,Tooth/dental problems,Sore throat,  No sneezing, itching, ear ache, nasal congestion, post nasal drip,  Cardio-vascular:  No chest pain, Orthopnea, PND, anasarca, dizziness, palpitations.no Bilateral lower extremity swelling  GI:  No heartburn, indigestion, abdominal pain, nausea, vomiting, diarrhea, change in bowel habits, loss of appetite, melena, blood in stool, hematemesis Resp:  no  No  No excess mucus, no productive cough, No No coughing up of blood.No change in color of mucus.No Skin:  no rash or lesions. No jaundice GU:  no dysuria, change in color of urine, no urgency or frequency. No straining to urinate.  No flank pain.  Musculoskeletal:  No joint pain or no joint swelling. No decreased range of motion. No back pain.  Psych:  No change in mood or affect. No depression or anxiety. No memory loss.  Neuro: no localizing neurological complaints, no tingling, no weakness, no double vision, no gait abnormality, no slurred speech, no confusion  All systems reviewed and apart from [place] all are negative  Past Medical History:   Past Medical History:  Diagnosis Date  . Acute respiratory failure with hypoxia ([place])   . Anxiety   . Aortic atherosclerosis ([place])   . Arthritis    "a little in my hands probably" (10/31/2017)  . COPD (chronic obstructive pulmonary disease) ([place])   . COPD exacerbation ([place])   . Depression   . History of kidney stones   . Suicide attempt ([place]) 02/21/2018   overdosed on Wellbutrin      Past Surgical History:  Procedure Laterality Date  . ABDOMINAL HYSTERECTOMY    . APPENDECTOMY    . BREAST BIOPSY Left 1990s  . CATARACT EXTRACTION W/ INTRAOCULAR LENS  IMPLANT, BILATERAL Bilateral   . DILATION AND CURETTAGE OF UTERUS    . FUDUCIAL PLACEMENT N/A 11/11/2018   Procedure: PLACEMENT OF FUDUCIAL;  Surgeon: Melrose Nakayama, MD;  Location: [facility];  Service: Thoracic;  Laterality: N/A;  . TONSILLECTOMY     . TUBAL LIGATION    . VIDEO BRONCHOSCOPY WITH ENDOBRONCHIAL NAVIGATION N/A 11/11/2018   Procedure: VIDEO BRONCHOSCOPY WITH ENDOBRONCHIAL NAVIGATION;  Surgeon: Melrose Nakayama, MD;  Location: [facility];  Service: Thoracic;  Laterality: N/A;    Social History:  Ambulatory   independently       reports that she quit smoking about 17 months ago. Her smoking use included cigarettes. She has a 76.50 pack-year smoking history. She has never used smokeless tobacco. She reports previous alcohol use. She reports that she does not use drugs.     Family History:  Family History  Problem Relation Age of Onset  . Breast cancer Mother     Allergies: Allergies  Allergen Reactions  . Demerol [Meperidine] Other (See Comments)    "horrible reaction"  . Oxycodone-Acetaminophen Other (See Comments)    Upset stomach   . Tramadol Nausea And Vomiting     Prior to Admission medications   Medication Sig Start Date End Date Taking? Authorizing Provider  albuterol (PROVENTIL HFA;VENTOLIN HFA) 108 (90 Base) MCG/ACT inhaler Inhale 2 puffs into the lungs every 6 (six) hours as needed for wheezing or shortness of breath. 06/18/18  Yes Rigoberto Noel, MD  albuterol (PROVENTIL) (2.5 MG/3ML) 0.083% nebulizer solution Take 3 mLs (2.5 mg total) by nebulization every 6 (six) hours as needed for wheezing or shortness of breath. 06/21/18  Yes Rigoberto Noel, MD  alendronate (FOSAMAX) 70 MG tablet Take 70 mg by mouth every Wednesday.  07/11/17  Yes [provider]  atorvastatin (LIPITOR) 10 MG tablet Take 10 mg by mouth daily.   Yes [provider]  benzonatate (TESSALON) 200 MG capsule Take 1 capsule (200 mg total) by mouth 3 (three) times daily as needed for cough. 12/16/18  Yes Martyn Ehrich, NP  budesonide-formoterol [facility] [place]) 160-4.5 MCG/ACT inhaler Inhale 2 puffs into the lungs 2 (two) times daily. 05/28/18  Yes Martyn Ehrich, NP  Cholecalciferol (VITAMIN D) 50 MCG (2000 UT) tablet  Take 2,000 Units by mouth daily.   Yes [provider]  guaiFENesin (MUCINEX) 600 MG 12 hr tablet Take 2 tablets (1,200 mg total) by mouth 2 (two) times daily. Patient taking differently: Take 600 mg by mouth 2 (two) times daily.  02/06/19  Yes Martyn Ehrich, NP  montelukast (SINGULAIR) 10 MG tablet Take 1 tablet (10 mg total) by mouth at bedtime. 12/26/17  Yes Arnell Asal, NP  naproxen (NAPROSYN) 375 MG tablet Take 1 tablet (375 mg total) by mouth 2 (two) times daily. Patient taking differently: Take 375 mg by mouth 2 (two) times daily as needed for moderate pain.  02/19/19  Yes Nat Christen, MD  pantoprazole (PROTONIX) 40 MG tablet Take 1 tablet (40 mg total) by mouth 2 (two) times daily. Patient taking differently: Take 40 mg by mouth daily.  12/26/17  Yes Jennelle Human B, NP  sertraline (ZOLOFT) 50 MG tablet Take 1 tablet (50 mg total) by mouth daily. 03/01/18  Yes McNew, Tyson Babinski, MD  tiotropium (SPIRIVA) 18 MCG inhalation capsule Place 18 mcg into inhaler and inhale daily.   Yes [provider]  doxycycline (VIBRA-TABS) 100 MG tablet Take 1 tablet (100 mg total) by mouth 2 (two) times daily. Patient not taking: Reported on 02/28/2019 02/06/19   Martyn Ehrich, NP  predniSONE (DELTASONE) 10 MG tablet 40mg  x 3 days; 30mg  x 3 days; 20mg  x 3 days; then STAY on 10 mg daily until stopped or changed by provider Patient not taking: Reported on 02/28/2019 01/23/19   Martyn Ehrich, NP  promethazine-codeine (PHENERGAN WITH CODEINE) 6.25-10 MG/5ML syrup Take 5 mLs by mouth every 6 (six) hours as needed for cough. Patient not taking: Reported on 02/28/2019 01/23/19   Martyn Ehrich, NP  tiotropium (SPIRIVA HANDIHALER) 18 MCG inhalation capsule Place 1 capsule (18 mcg total) into inhaler and inhale daily. 01/03/18 01/03/19  Fenton Foy, NP   Physical Exam: Blood pressure 112/65, pulse 100, temperature 97.8 F (36.6 C), temperature source Oral, resp. rate 19, SpO2 (!)  88 %. 1. General:  in No  Acute distress   Chronically ill  -appearing 2. Psychological: Alert and    Oriented 3. Head/ENT:     Dry Mucous Membranes                          Head Non traumatic, neck supple                           Poor Dentition 4. SKIN:   decreased Skin turgor,  Skin clean Dry and intact redness and swelling of the right foot 5. Heart: Regular rate and rhythm no Murmur, no Rub or gallop 6. Lungs:  no wheezes or crackles   7. Abdomen: Soft,  non-tender, Non distended  bowel sounds present 8. Lower extremities: no clubbing, cyanosis, no  edema 9. Neurologically Grossly intact, moving all 4 extremities equally   10. MSK: Normal range of motion   All other LABS:     Recent Labs  Lab 02/28/19 1441  WBC 9.2  NEUTROABS 6.1  HGB 12.4  HCT 40.3  MCV 93.1  PLT 338     Recent Labs  Lab 02/28/19 1441  NA 140  K 4.1  CL 108  CO2 24  GLUCOSE 83  BUN 14  CREATININE 0.80  CALCIUM 8.5*     Recent Labs  Lab 02/28/19 1441  AST 33  ALT 39  ALKPHOS 161*  BILITOT 0.5  PROT 6.6  ALBUMIN 3.5       Cultures:    Component Value Date/Time   SDES URINE, CLEAN CATCH 10/31/2017 0439   SPECREQUEST NONE 10/31/2017 0439   CULT (A) 10/31/2017 0439    <10,000 COLONIES/mL INSIGNIFICANT GROWTH Performed at [facility] [address], [place], [place] [address]    REPTSTATUS 11/01/2017 FINAL 10/31/2017 0439     Radiological Exams on Admission: Ct Angio Chest Pe W/cm &/or Wo Cm  Result Date: 02/28/2019 CLINICAL DATA:  CONTRAST-100ML OMNI 350 Pt BIB by EMS from PCP office pt has been having increasing SOB , with productive cough past 2 days and is DOE at baseline, Pt reports increasing DOE with position changes. Pt has HX of COPD , Lung Chest pain, complex, intermediate/high prob of ACS/PE/AAS CA pt w R LE swelling and dyspnea w hypoxia w exertion EXAM: CT ANGIOGRAPHY CHEST WITH CONTRAST TECHNIQUE: Multidetector CT imaging of the chest was performed using the  standard protocol during bolus administration of intravenous contrast. Multiplanar CT image reconstructions and MIPs were obtained to evaluate the vascular anatomy. CONTRAST:  185mL OMNIPAQUE IOHEXOL 350 MG/ML SOLN COMPARISON:  Chest CT 01/14/2019 FINDINGS: Cardiovascular: No filling defects within the pulmonary arteries to suggest acute pulmonary embolism. No acute findings of the aorta or great vessels. No pericardial fluid. Mediastinum/Nodes: No axillary supraclavicular adenopathy. No mediastinal hilar adenopathy. No pericardial effusion. Esophagus normal. . Lungs/Pleura: Centrilobular emphysema the upper lobes. Angular nodule in the LEFT upper lobe measuring 1.3 by 0.8 cm compares to 1.4 by 0.9 cm on comparison recent exam remeasured. Lesion was hypermetabolic on comparison PET-CT scan of 10/18/2018. Upper Abdomen: Limited view of the liver, kidneys, pancreas are unremarkable. Normal adrenal glands. Musculoskeletal: No aggressive osseous lesion. Review of the MIP images confirms the above findings. IMPRESSION: 1. No evidence acute pulmonary embolism. 2. Spiculated nodule in the LEFT upper lobe hypermetabolic on comparison PET-CT scan and concerning for bronchogenic carcinoma. 3. Emphysema Electronically Signed   By: Suzy Bouchard M.D.   On: 02/28/2019 17:05   Vas [place] Lower Extremity Venous (dvt) (only [facility] & [facility] 7a-7p)  Result Date: 02/28/2019  Lower Venous Study Indications: Edema.  Risk Factors: None identified. Limitations: Body habitus and poor ultrasound/tissue interface. Comparison Study: No prior studies. Performing Technologist: [name] Hum RVT  Examination Guidelines: A complete evaluation includes B-mode imaging, spectral Doppler, color Doppler, and power Doppler as needed of all accessible portions of each vessel. Bilateral testing is considered an integral part of a complete examination. Limited examinations for reoccurring indications may be performed as noted.   +---------+---------------+---------+-----------+----------+--------------+ RIGHT    CompressibilityPhasicitySpontaneityPropertiesThrombus Aging +---------+---------------+---------+-----------+----------+--------------+ CFV      Full           Yes      Yes                                 +---------+---------------+---------+-----------+----------+--------------+ SFJ      Full                                                        +---------+---------------+---------+-----------+----------+--------------+  FV Prox                 Yes      Yes                                 +---------+---------------+---------+-----------+----------+--------------+ FV Mid   Full                                                        +---------+---------------+---------+-----------+----------+--------------+ FV DistalFull                                                        +---------+---------------+---------+-----------+----------+--------------+ PFV      Full                                                        +---------+---------------+---------+-----------+----------+--------------+ POP      Full           Yes      Yes                                 +---------+---------------+---------+-----------+----------+--------------+ PTV      Full                                                        +---------+---------------+---------+-----------+----------+--------------+ PERO     Full                                                        +---------+---------------+---------+-----------+----------+--------------+   +----+---------------+---------+-----------+----------+--------------+ LEFTCompressibilityPhasicitySpontaneityPropertiesThrombus Aging +----+---------------+---------+-----------+----------+--------------+ CFV Full           Yes      Yes                                  +----+---------------+---------+-----------+----------+--------------+     Summary: Right: There is no evidence of deep vein thrombosis in the lower extremity. However, portions of this examination were limited- see technologist comments above. No cystic structure found in the popliteal fossa. Left: No evidence of common femoral vein obstruction.  *See table(s) above for measurements and observations.    Preliminary     Chart has been reviewed    Assessment/Plan  68 y.o. female with medical history significant of COPD, malignant neoplasm upper lobe left lung, hypoxia, anxiety/depression, attempted suicide  former smoker quit May 2019 (74 pack year hx)  Admitted for COPD exacerbation and right lower extremity cellulitis  Present on Admission: . COPD with acute exacerbation ([place])-  Will initiate: Steroid taper  -  Antibiotics ceftriaxone - Albuterol  PRN, - scheduled duoneb if COVID is negative,  -  Breo or Dulera at discharge   -  Mucinex.  Titrate O2 to saturation >90%. Follow patients respiratory status.  Order respiratory panel PCR - pulmonology awre      Currently mentating well no evidence of symptomatic hypercarbia    Cellulitis of right leg -   continue current antibiotic choice    Will obtain MRSA screening,      obtain blood cultures if febrile or septic     further antibiotic adjustment pending above results   . Hypoxia - likely due to COPD no evidence of infection or PE on CTA chest, COVID pending admit as PUI for now  . Anxiety and depression - stable cont home medications  . Malignant neoplasm of upper lobe of left lung [facility])- will notify  pulmonology that pt has been admitted, currently not followed by oncology as she has never received chemotherapy   Other plan as per orders.  DVT prophylaxis:  Lovenox     Code Status:  FULL CODE  as per patient   I had personally discussed CODE STATUS with patient   Family Communication:   Family not at  Bedside    Disposition Plan:  To home once workup is complete and patient is stable                     Would benefit from PT/OT eval prior to DC  Ordered                                       Consults called:       Pulmonology will see in AM    Admission status:  ED Disposition    None       inpatient     Expect 2 midnight stay secondary to severity of patient's current illness including    hemodynamic instability despite optimal treatment (tachycardia hypoxia )     and extensive comorbidities including:   COPD/asthma   malignancy,   That are currently affecting medical management.   I expect  patient to be hospitalized for 2 midnights requiring inpatient medical care.  Patient is at high risk for adverse outcome (such as loss of life or disability) if not treated.  Indication for inpatient stay as follows:  Severe change from baseline regarding mental status Hemodynamic instability despite maximal medical therapy,    New or worsening hypoxia  Need for IV antibiotics, IV fluids,      Level of care     tele  For  24H      No active isolations  PPE: Used by the provider:   P100  eye Goggles,  Gloves  gown     Suraya Vidrine 02/28/2019, 9:37 PM    [facility]     after 2 AM please page floor coverage PA If 7AM-7PM, please contact the day team taking care of the patient using [email or web address]

## 2019-03-01 ENCOUNTER — Inpatient Hospital Stay (HOSPITAL_COMMUNITY): Payer: Medicare HMO

## 2019-03-01 ENCOUNTER — Other Ambulatory Visit: Payer: Self-pay

## 2019-03-01 DIAGNOSIS — R0602 Shortness of breath: Secondary | ICD-10-CM

## 2019-03-01 DIAGNOSIS — R0902 Hypoxemia: Secondary | ICD-10-CM

## 2019-03-01 DIAGNOSIS — J441 Chronic obstructive pulmonary disease with (acute) exacerbation: Secondary | ICD-10-CM | POA: Diagnosis not present

## 2019-03-01 LAB — SARS CORONAVIRUS 2 (TAT 6-24 HRS): SARS Coronavirus 2: NEGATIVE

## 2019-03-01 LAB — RESPIRATORY PANEL BY PCR

## 2019-03-01 LAB — URINALYSIS, ROUTINE W REFLEX MICROSCOPIC
Bilirubin Urine: NEGATIVE
Glucose, UA: NEGATIVE mg/dL
Hgb urine dipstick: NEGATIVE
Ketones, ur: 20 mg/dL — AB
Leukocytes,Ua: NEGATIVE
Nitrite: NEGATIVE
Protein, ur: NEGATIVE mg/dL
Specific Gravity, Urine: 1.02 (ref 1.005–1.030)
pH: 6 (ref 5.0–8.0)

## 2019-03-01 LAB — COMPREHENSIVE METABOLIC PANEL
ALT: 39 U/L (ref 0–44)
AST: 36 U/L (ref 15–41)
Albumin: 3.8 g/dL (ref 3.5–5.0)
Alkaline Phosphatase: 150 U/L — ABNORMAL HIGH (ref 38–126)
Anion gap: 14 (ref 5–15)
BUN: 15 mg/dL (ref 8–23)
CO2: 20 mmol/L — ABNORMAL LOW (ref 22–32)
Calcium: 8.7 mg/dL — ABNORMAL LOW (ref 8.9–10.3)
Chloride: 105 mmol/L (ref 98–111)
Creatinine, Ser: 0.73 mg/dL (ref 0.44–1.00)
GFR calc Af Amer: >60 mL/min (ref >60)
GFR calc non Af Amer: >60 mL/min (ref >60)
Glucose, Bld: 108 mg/dL — ABNORMAL HIGH (ref 70–99)
Potassium: 4.7 mmol/L (ref 3.5–5.1)
Sodium: 139 mmol/L (ref 135–145)
Total Bilirubin: 0.6 mg/dL (ref 0.3–1.2)
Total Protein: 7.1 g/dL (ref 6.5–8.1)

## 2019-03-01 LAB — CBC
HCT: 43.6 % (ref 36.0–46.0)
Hemoglobin: 13 g/dL (ref 12.0–15.0)
MCH: 28.4 pg (ref 26.0–34.0)
MCHC: 29.8 g/dL — ABNORMAL LOW (ref 30.0–36.0)
MCV: 95.4 fL (ref 80.0–100.0)
Platelets: 301 10*3/uL (ref 150–400)
RBC: 4.57 MIL/uL (ref 3.87–5.11)
RDW: 13.8 % (ref 11.5–15.5)
WBC: 7.2 10*3/uL (ref 4.0–10.5)
nRBC: 0 % (ref 0.0–0.2)

## 2019-03-01 LAB — ECHOCARDIOGRAM COMPLETE
Height: 65 in
Weight: 2880 oz

## 2019-03-01 LAB — HIV ANTIBODY (ROUTINE TESTING W REFLEX): HIV Screen 4th Generation wRfx: NONREACTIVE

## 2019-03-01 LAB — TSH: TSH: 1.165 u[IU]/mL (ref 0.350–4.500)

## 2019-03-01 LAB — TROPONIN I (HIGH SENSITIVITY): Troponin I (High Sensitivity): 4 ng/L (ref <18)

## 2019-03-01 LAB — MAGNESIUM: Magnesium: 2.1 mg/dL (ref 1.7–2.4)

## 2019-03-01 LAB — PHOSPHORUS: Phosphorus: 4.3 mg/dL (ref 2.5–4.6)

## 2019-03-01 LAB — LACTIC ACID, PLASMA: Lactic Acid, Venous: 2.1 mmol/L (ref 0.5–1.9)

## 2019-03-01 MED ORDER — SODIUM CHLORIDE 0.9 % IV SOLN: INTRAVENOUS | Status: DC

## 2019-03-01 MED ORDER — ONDANSETRON HCL 4 MG/2ML IJ SOLN: 4.0000 mg | Freq: Four times a day (QID) | INTRAMUSCULAR | Status: DC | PRN

## 2019-03-01 MED ORDER — SERTRALINE HCL 50 MG PO TABS
50.0000 mg | ORAL_TABLET | Freq: Every day | ORAL | Status: DC
  Administered 2019-03-01 – 2019-03-03 (×3): 50 mg via ORAL
  Filled 2019-03-01 (×3): qty 1

## 2019-03-01 MED ORDER — PREDNISONE 20 MG PO TABS
40.0000 mg | ORAL_TABLET | Freq: Every day | ORAL | Status: DC
  Administered 2019-03-02 – 2019-03-03 (×2): 40 mg via ORAL
  Filled 2019-03-01 (×2): qty 2

## 2019-03-01 MED ORDER — UMECLIDINIUM BROMIDE 62.5 MCG/INH IN AEPB
1.0000 | INHALATION_SPRAY | Freq: Every day | RESPIRATORY_TRACT | Status: DC
  Administered 2019-03-02 – 2019-03-03 (×2): 1 via RESPIRATORY_TRACT
  Filled 2019-03-01: qty 7

## 2019-03-01 MED ORDER — MOMETASONE FURO-FORMOTEROL FUM 200-5 MCG/ACT IN AERO: 1.0000 | INHALATION_SPRAY | Freq: Two times a day (BID) | RESPIRATORY_TRACT | Status: DC

## 2019-03-01 MED ORDER — MOMETASONE FURO-FORMOTEROL FUM 200-5 MCG/ACT IN AERO
2.0000 | INHALATION_SPRAY | Freq: Two times a day (BID) | RESPIRATORY_TRACT | Status: DC
  Administered 2019-03-01 – 2019-03-03 (×5): 2 via RESPIRATORY_TRACT
  Filled 2019-03-01: qty 8.8

## 2019-03-01 MED ORDER — ACETAMINOPHEN 650 MG RE SUPP: 650.0000 mg | Freq: Four times a day (QID) | RECTAL | Status: DC | PRN

## 2019-03-01 MED ORDER — BENZONATATE 100 MG PO CAPS
200.0000 mg | ORAL_CAPSULE | Freq: Three times a day (TID) | ORAL | Status: DC | PRN
  Administered 2019-03-02: 200 mg via ORAL
  Filled 2019-03-01 (×2): qty 2

## 2019-03-01 MED ORDER — DM-GUAIFENESIN ER 30-600 MG PO TB12
2.0000 | ORAL_TABLET | Freq: Two times a day (BID) | ORAL | Status: DC
  Administered 2019-03-01 – 2019-03-03 (×5): 2 via ORAL
  Filled 2019-03-01: qty 1
  Filled 2019-03-01: qty 2
  Filled 2019-03-01: qty 1
  Filled 2019-03-01 (×3): qty 2

## 2019-03-01 MED ORDER — ONDANSETRON HCL 4 MG PO TABS: 4.0000 mg | ORAL_TABLET | Freq: Four times a day (QID) | ORAL | Status: DC | PRN

## 2019-03-01 MED ORDER — MONTELUKAST SODIUM 10 MG PO TABS
10.0000 mg | ORAL_TABLET | Freq: Every day | ORAL | Status: DC
  Administered 2019-03-01 – 2019-03-02 (×3): 10 mg via ORAL
  Filled 2019-03-01 (×4): qty 1

## 2019-03-01 MED ORDER — SODIUM CHLORIDE 0.9% FLUSH
3.0000 mL | Freq: Two times a day (BID) | INTRAVENOUS | Status: DC
  Administered 2019-03-01 – 2019-03-02 (×4): 3 mL via INTRAVENOUS

## 2019-03-01 MED ORDER — SODIUM CHLORIDE 0.9 % IV SOLN: 250.0000 mL | INTRAVENOUS | Status: DC | PRN

## 2019-03-01 MED ORDER — PANTOPRAZOLE SODIUM 40 MG PO TBEC: 40.0000 mg | DELAYED_RELEASE_TABLET | Freq: Every day | ORAL | Status: DC

## 2019-03-01 MED ORDER — SODIUM CHLORIDE 0.9% FLUSH: 3.0000 mL | INTRAVENOUS | Status: DC | PRN

## 2019-03-01 MED ORDER — UMECLIDINIUM BROMIDE 62.5 MCG/INH IN AEPB
1.0000 | INHALATION_SPRAY | Freq: Every day | RESPIRATORY_TRACT | Status: DC
  Administered 2019-03-01: 1 via RESPIRATORY_TRACT
  Filled 2019-03-01: qty 7

## 2019-03-01 MED ORDER — METHYLPREDNISOLONE SODIUM SUCC 125 MG IJ SOLR
60.0000 mg | Freq: Two times a day (BID) | INTRAMUSCULAR | Status: AC
  Administered 2019-03-01 (×2): 60 mg via INTRAVENOUS
  Filled 2019-03-01 (×2): qty 2

## 2019-03-01 MED ORDER — PANTOPRAZOLE SODIUM 40 MG PO TBEC
40.0000 mg | DELAYED_RELEASE_TABLET | Freq: Two times a day (BID) | ORAL | Status: DC
  Administered 2019-03-01 – 2019-03-03 (×4): 40 mg via ORAL
  Filled 2019-03-01 (×4): qty 1

## 2019-03-01 MED ORDER — ENOXAPARIN SODIUM 40 MG/0.4ML ~~LOC~~ SOLN
40.0000 mg | SUBCUTANEOUS | Status: DC
  Administered 2019-03-01 – 2019-03-03 (×3): 40 mg via SUBCUTANEOUS
  Filled 2019-03-01 (×4): qty 0.4

## 2019-03-01 MED ORDER — ATORVASTATIN CALCIUM 10 MG PO TABS
10.0000 mg | ORAL_TABLET | Freq: Every day | ORAL | Status: DC
  Administered 2019-03-01 – 2019-03-03 (×3): 10 mg via ORAL
  Filled 2019-03-01 (×3): qty 1

## 2019-03-01 MED ORDER — SODIUM CHLORIDE 0.9% FLUSH
3.0000 mL | Freq: Two times a day (BID) | INTRAVENOUS | Status: DC
  Administered 2019-03-01 (×2): 3 mL via INTRAVENOUS

## 2019-03-01 MED ORDER — ALBUTEROL SULFATE HFA 108 (90 BASE) MCG/ACT IN AERS
2.0000 | INHALATION_SPRAY | RESPIRATORY_TRACT | Status: DC | PRN
  Filled 2019-03-01: qty 6.7

## 2019-03-01 MED ORDER — GUAIFENESIN ER 600 MG PO TB12
600.0000 mg | ORAL_TABLET | Freq: Two times a day (BID) | ORAL | Status: DC
  Administered 2019-03-01: 600 mg via ORAL
  Filled 2019-03-01: qty 1

## 2019-03-01 MED ORDER — ACETAMINOPHEN 325 MG PO TABS: 650.0000 mg | ORAL_TABLET | Freq: Four times a day (QID) | ORAL | Status: DC | PRN

## 2019-03-01 NOTE — ED Notes (Signed)
ED TO INPATIENT HANDOFF REPORT  Name/Age/Gender Lori Butler 68 y.o. female  Code Status    Code Status Orders  (From admission, onward)         Start     Ordered   03/01/19 0112  Full code  Continuous     03/01/19 0112        Code Status History    Date Active Date Inactive Code Status Order ID Comments User Context   05/05/2018 1916 05/08/2018 1536 Full Code [id number]  Lenore Cordia, MD ED   02/26/2018 2108 02/28/2018 1437 Full Code [id number]  Clovis Fredrickson, MD Inpatient   02/21/2018 1717 02/26/2018 2103 Full Code [id number]  Thurnell Lose, MD Inpatient   12/24/2017 1156 12/26/2017 2036 Full Code [id number]  Marijean Heath, NP ED   12/09/2017 1050 12/11/2017 1859 Full Code [id number]  Radene Gunning, NP ED   10/30/2017 0044 11/02/2017 1643 Full Code [id number]  Rise Patience, MD Inpatient   09/15/2017 1737 09/16/2017 2019 Full Code [id number]  Nita Sells, MD Inpatient   Advance Care Planning Activity      Home/SNF/Other Home  Chief Complaint shortness of breath right foot edema  Level of Care/Admitting Diagnosis ED Disposition    ED Disposition Condition [facility]: [facility] [[id number]]  Level of Care: Telemetry [5]  Admit to tele based on following criteria: Monitor for Ischemic changes  Covid Evaluation: Confirmed COVID Negative  Diagnosis: COPD with acute exacerbation [facility]) [[id number]]  Admitting Physician: Toy Baker [[id number]]  Attending Physician: Toy Baker [[id number]]  Estimated length of stay: 3 - 4 days  Certification:: I certify this patient will need inpatient services for at least 2 midnights  PT Class (Do Not Modify): Inpatient [101]  PT Acc Code (Do Not Modify): Private [1]       Medical History Past Medical History:  Diagnosis Date  . Acute respiratory failure with hypoxia ([place])   . Anxiety   . Aortic atherosclerosis ([place])   . Arthritis    "a little in my hands  probably" (10/31/2017)  . COPD (chronic obstructive pulmonary disease) ([place])   . COPD exacerbation ([place])   . Depression   . History of kidney stones   . Suicide attempt ([place]) 02/21/2018   overdosed on Wellbutrin    Allergies Allergies  Allergen Reactions  . Demerol [Meperidine] Other (See Comments)    "horrible reaction"  . Oxycodone-Acetaminophen Other (See Comments)    Upset stomach   . Tramadol Nausea And Vomiting    IV Location/Drains/Wounds Patient Lines/Drains/Airways Status   Active Line/Drains/Airways    Name:   Placement date:   Placement time:   Site:   Days:   Peripheral IV 02/28/19 Right Antecubital   02/28/19    1522    Antecubital   1   Incision (Closed) 11/11/18 N/A Other (Comment)   11/11/18    0914     110          Labs/Imaging Results for orders placed or performed during the hospital encounter of 02/28/19 (from the past 48 hour(s))  Comprehensive metabolic panel     Status: Abnormal   Collection Time: 02/28/19  2:41 PM  Result Value Ref Range   Sodium 140 135 - 145 mmol/L   Potassium 4.1 3.5 - 5.1 mmol/L   Chloride 108 98 - 111 mmol/L   CO2 24 22 - 32 mmol/L   Glucose, Bld 83 70 - 99 mg/dL  BUN 14 8 - 23 mg/dL   Creatinine, Ser 0.80 0.44 - 1.00 mg/dL   Calcium 8.5 (L) 8.9 - 10.3 mg/dL   Total Protein 6.6 6.5 - 8.1 g/dL   Albumin 3.5 3.5 - 5.0 g/dL   AST 33 15 - 41 U/L   ALT 39 0 - 44 U/L   Alkaline Phosphatase 161 (H) 38 - 126 U/L   Total Bilirubin 0.5 0.3 - 1.2 mg/dL   GFR calc non Af Amer >60 >60 mL/min   GFR calc Af Amer >60 >60 mL/min   Anion gap 8 5 - 15    Comment: Performed at [facility], [place] [address] [place], [place] [address]  CBC with Differential     Status: None   Collection Time: 02/28/19  2:41 PM  Result Value Ref Range   WBC 9.2 4.0 - 10.5 K/uL   RBC 4.33 3.87 - 5.11 MIL/uL   Hemoglobin 12.4 12.0 - 15.0 g/dL   HCT 40.3 36.0 - 46.0 %   MCV 93.1 80.0 - 100.0 fL   MCH 28.6 26.0 - 34.0 pg   MCHC 30.8  30.0 - 36.0 g/dL   RDW 13.7 11.5 - 15.5 %   Platelets 338 150 - 400 K/uL   nRBC 0.0 0.0 - 0.2 %   Neutrophils Relative % 65 %   Neutro Abs 6.1 1.7 - 7.7 K/uL   Lymphocytes Relative 19 %   Lymphs Abs 1.7 0.7 - 4.0 K/uL   Monocytes Relative 9 %   Monocytes Absolute 0.8 0.1 - 1.0 K/uL   Eosinophils Relative 5 %   Eosinophils Absolute 0.5 0.0 - 0.5 K/uL   Basophils Relative 1 %   Basophils Absolute 0.1 0.0 - 0.1 K/uL   Immature Granulocytes 1 %   Abs Immature Granulocytes 0.05 0.00 - 0.07 K/uL    Comment: Performed at [facility], [place] [address] [place], [place] [address]  Lactic acid, plasma     Status: None   Collection Time: 02/28/19  2:41 PM  Result Value Ref Range   Lactic Acid, Venous 0.9 0.5 - 1.9 mmol/L    Comment: Performed at [facility], [place] [address], [place], [place] [address]  SARS CORONAVIRUS 2 (TAT 6-24 HRS) Nasopharyngeal Nasopharyngeal Swab     Status: None   Collection Time: 02/28/19  6:39 PM   Specimen: Nasopharyngeal Swab  Result Value Ref Range   SARS Coronavirus 2 NEGATIVE NEGATIVE    Comment: (NOTE) SARS-CoV-2 target nucleic acids are NOT DETECTED. The SARS-CoV-2 RNA is generally detectable in upper and lower respiratory specimens during the acute phase of infection. Negative results do not preclude SARS-CoV-2 infection, do not rule out co-infections with other pathogens, and should not be used as the sole basis for treatment or other patient management decisions. Negative results must be combined with clinical observations, patient history, and epidemiological information. The expected result is Negative. Fact Sheet for Patients: SugarRoll.be Fact Sheet for Healthcare Providers: https://www.woods-mathews.com/ This test is not yet approved or cleared by the [place] FDA and  has been authorized for detection and/or diagnosis of SARS-CoV-2 by FDA under an Emergency Use  Authorization (EUA). This EUA will remain  in effect (meaning this test can be used) for the duration of the COVID-19 declaration under Section 56 4(b)(1) of the Act, 21 U.S.C. section 360bbb-3(b)(1), unless the authorization is terminated or revoked sooner. Performed at [facility], [place] [address] [place], [place] [address]  Troponin I (High Sensitivity)     Status: None   Collection Time: 02/28/19  9:53 PM  Result Value Ref Range   Troponin I (High Sensitivity) 8 <18 ng/L    Comment: (NOTE) Elevated high sensitivity troponin I (hsTnI) values and significant  changes across serial measurements may suggest ACS but many other  chronic and acute conditions are known to elevate hsTnI results.  Refer to the "Links" section for chest pain algorithms and additional  guidance. Performed at [facility], [place] [address] [place], [place] [address]   Brain natriuretic peptide     Status: None   Collection Time: 02/28/19  9:53 PM  Result Value Ref Range   B Natriuretic Peptide 32.7 0.0 - 100.0 pg/mL    Comment: Performed at [facility], [place] [address] [place], [place] [address]  Procalcitonin     Status: None   Collection Time: 02/28/19  9:53 PM  Result Value Ref Range   Procalcitonin <0.10 ng/mL    Comment:        Interpretation: PCT (Procalcitonin) <= 0.5 ng/mL: Systemic infection (sepsis) is not likely. Local bacterial infection is possible. (NOTE)       Sepsis PCT Algorithm           Lower Respiratory Tract                                      Infection PCT Algorithm    ----------------------------     ----------------------------         PCT < 0.25 ng/mL                PCT < 0.10 ng/mL         Strongly encourage             Strongly discourage   discontinuation of antibiotics    initiation of antibiotics    ----------------------------     -----------------------------       PCT 0.25 - 0.50 ng/mL            PCT 0.10 - 0.25 ng/mL                OR       >80% decrease in PCT            Discourage initiation of                                            antibiotics      Encourage discontinuation           of antibiotics    ----------------------------     -----------------------------         PCT >= 0.50 ng/mL              PCT 0.26 - 0.50 ng/mL               AND        <80% decrease in PCT             Encourage initiation of  antibiotics       Encourage continuation           of antibiotics    ----------------------------     -----------------------------        PCT >= 0.50 ng/mL                  PCT > 0.50 ng/mL               AND         increase in PCT                  Strongly encourage                                      initiation of antibiotics    Strongly encourage escalation           of antibiotics                                     -----------------------------                                           PCT <= 0.25 ng/mL                                                 OR                                        > 80% decrease in PCT                                     Discontinue / Do not initiate                                             antibiotics Performed at [place] [address] [place], [place] [address]   Lactate dehydrogenase     Status: Abnormal   Collection Time: 02/28/19  9:53 PM  Result Value Ref Range   LDH 215 (H) 98 - 192 U/L    Comment: Performed at [facility], [place] [address], [place], [place] [address]  MRSA PCR Screening     Status: None   Collection Time: 02/28/19  9:53 PM   Specimen: Nasopharyngeal Swab  Result Value Ref Range   MRSA by PCR NEGATIVE NEGATIVE    Comment:        The GeneXpert MRSA Assay (FDA approved for NASAL specimens only), is one component of a comprehensive MRSA colonization surveillance program. It is not intended to diagnose MRSA infection nor to guide  or monitor treatment for MRSA infections. Performed at [facility], [place] [address], [place], [place] [address]    Ct Angio Chest Pe W/cm &/or Wo Cm  Result Date:  02/28/2019 CLINICAL DATA:  CONTRAST-100ML OMNI 350 Pt BIB by EMS from PCP office pt has been having increasing SOB , with productive cough past 2 days and is DOE at baseline, Pt reports increasing DOE with position changes. Pt has HX of COPD , Lung Chest pain, complex, intermediate/high prob of ACS/PE/AAS CA pt w R LE swelling and dyspnea w hypoxia w exertion EXAM: CT ANGIOGRAPHY CHEST WITH CONTRAST TECHNIQUE: Multidetector CT imaging of the chest was performed using the standard protocol during bolus administration of intravenous contrast. Multiplanar CT image reconstructions and MIPs were obtained to evaluate the vascular anatomy. CONTRAST:  153mL OMNIPAQUE IOHEXOL 350 MG/ML SOLN COMPARISON:  Chest CT 01/14/2019 FINDINGS: Cardiovascular: No filling defects within the pulmonary arteries to suggest acute pulmonary embolism. No acute findings of the aorta or great vessels. No pericardial fluid. Mediastinum/Nodes: No axillary supraclavicular adenopathy. No mediastinal hilar adenopathy. No pericardial effusion. Esophagus normal. . Lungs/Pleura: Centrilobular emphysema the upper lobes. Angular nodule in the LEFT upper lobe measuring 1.3 by 0.8 cm compares to 1.4 by 0.9 cm on comparison recent exam remeasured. Lesion was hypermetabolic on comparison PET-CT scan of 10/18/2018. Upper Abdomen: Limited view of the liver, kidneys, pancreas are unremarkable. Normal adrenal glands. Musculoskeletal: No aggressive osseous lesion. Review of the MIP images confirms the above findings. IMPRESSION: 1. No evidence acute pulmonary embolism. 2. Spiculated nodule in the LEFT upper lobe hypermetabolic on comparison PET-CT scan and concerning for bronchogenic carcinoma. 3. Emphysema Electronically Signed   By: Suzy Bouchard M.D.   On: 02/28/2019  17:05   Vas [place] Lower Extremity Venous (dvt) (only Mc & Wl 7a-7p)  Result Date: 02/28/2019  Lower Venous Study Indications: Edema.  Risk Factors: None identified. Limitations: Body habitus and poor ultrasound/tissue interface. Comparison Study: No prior studies. Performing Technologist: [name] Hum RVT  Examination Guidelines: A complete evaluation includes B-mode imaging, spectral Doppler, color Doppler, and power Doppler as needed of all accessible portions of each vessel. Bilateral testing is considered an integral part of a complete examination. Limited examinations for reoccurring indications may be performed as noted.  +---------+---------------+---------+-----------+----------+--------------+ RIGHT    CompressibilityPhasicitySpontaneityPropertiesThrombus Aging +---------+---------------+---------+-----------+----------+--------------+ CFV      Full           Yes      Yes                                 +---------+---------------+---------+-----------+----------+--------------+ SFJ      Full                                                        +---------+---------------+---------+-----------+----------+--------------+ FV Prox                 Yes      Yes                                 +---------+---------------+---------+-----------+----------+--------------+ FV Mid   Full                                                        +---------+---------------+---------+-----------+----------+--------------+  FV DistalFull                                                        +---------+---------------+---------+-----------+----------+--------------+ PFV      Full                                                        +---------+---------------+---------+-----------+----------+--------------+ POP      Full           Yes      Yes                                 +---------+---------------+---------+-----------+----------+--------------+ PTV      Full                                                         +---------+---------------+---------+-----------+----------+--------------+ PERO     Full                                                        +---------+---------------+---------+-----------+----------+--------------+   +----+---------------+---------+-----------+----------+--------------+ LEFTCompressibilityPhasicitySpontaneityPropertiesThrombus Aging +----+---------------+---------+-----------+----------+--------------+ CFV Full           Yes      Yes                                 +----+---------------+---------+-----------+----------+--------------+     Summary: Right: There is no evidence of deep vein thrombosis in the lower extremity. However, portions of this examination were limited- see technologist comments above. No cystic structure found in the popliteal fossa. Left: No evidence of common femoral vein obstruction.  *See table(s) above for measurements and observations.    Preliminary     Pending Labs Unresulted Labs (From admission, onward)    Start     Ordered   03/07/19 0500  Creatinine, serum  (enoxaparin (LOVENOX)    CrCl >/= 30 ml/min)  Weekly,   R    Comments: while on enoxaparin therapy    03/01/19 0112   03/01/19 0500  Magnesium  Tomorrow morning,   R    Comments: Call MD if <1.5    03/01/19 0112   03/01/19 0500  Phosphorus  Tomorrow morning,   R     03/01/19 0112   03/01/19 0500  TSH  Once,   STAT    Comments: Cancel if already done within 1 month and notify MD    03/01/19 0112   03/01/19 0500  Comprehensive metabolic panel  Once,   STAT    Comments: Cal MD for K<3.5 or >5.0    03/01/19 0112   03/01/19 0500  CBC  Once,   STAT    Comments: Call for hg <8.0    03/01/19 0112  03/01/19 0500  HIV Antibody (routine testing w rflx)  (HIV Antibody (Routine testing w reflex) panel)  Tomorrow morning,   R     03/01/19 0112   03/01/19 0112  Urinalysis, Routine w reflex microscopic  Once,    STAT     03/01/19 0112   02/28/19 2046  Respiratory Panel by PCR  (Respiratory virus panel with precautions)  Once,   STAT     02/28/19 2045   02/28/19 1441  Lactic acid, plasma  Now then every 2 hours,   STAT     02/28/19 1442          Vitals/Pain Today's Vitals   02/28/19 2330 03/01/19 0000 03/01/19 0030 03/01/19 0100  BP: 125/68 119/75 116/72 127/83  Pulse: 93 91 88 88  Resp: 19 17 18 20   Temp:      TempSrc:      SpO2: 99% 99% 99% 98%  Weight:      Height:        Isolation Precautions No active isolations  Medications Medications  cefTRIAXone (ROCEPHIN) 2 g in sodium chloride 0.9 % 100 mL IVPB (0 g Intravenous Stopped 02/28/19 2252)  umeclidinium bromide (INCRUSE ELLIPTA) 62.5 MCG/INH 1 puff (has no administration in time range)  atorvastatin (LIPITOR) tablet 10 mg (has no administration in time range)  sertraline (ZOLOFT) tablet 50 mg (has no administration in time range)  pantoprazole (PROTONIX) EC tablet 40 mg (has no administration in time range)  albuterol (VENTOLIN HFA) 108 (90 Base) MCG/ACT inhaler 2 puff (has no administration in time range)  benzonatate (TESSALON) capsule 200 mg (has no administration in time range)  mometasone-formoterol (DULERA) 200-5 MCG/ACT inhaler 2 puff (2 puffs Inhalation Not Given 03/01/19 0116)  guaiFENesin (MUCINEX) 12 hr tablet 600 mg (has no administration in time range)  montelukast (SINGULAIR) tablet 10 mg (has no administration in time range)  acetaminophen (TYLENOL) tablet 650 mg (has no administration in time range)    Or  acetaminophen (TYLENOL) suppository 650 mg (has no administration in time range)  ondansetron (ZOFRAN) tablet 4 mg (has no administration in time range)    Or  ondansetron (ZOFRAN) injection 4 mg (has no administration in time range)  sodium chloride flush (NS) 0.9 % injection 3 mL (has no administration in time range)  enoxaparin (LOVENOX) injection 40 mg (has no administration in time range)  sodium  chloride flush (NS) 0.9 % injection 3 mL (has no administration in time range)  sodium chloride flush (NS) 0.9 % injection 3 mL (has no administration in time range)  0.9 %  sodium chloride infusion (has no administration in time range)  methylPREDNISolone sodium succinate (SOLU-MEDROL) 125 mg/2 mL injection 60 mg (has no administration in time range)    Followed by  predniSONE (DELTASONE) tablet 40 mg (has no administration in time range)  clindamycin (CLEOCIN) IVPB 600 mg (0 mg Intravenous Stopped 02/28/19 1834)  sodium chloride (PF) 0.9 % injection (10 mLs  Given 02/28/19 1828)  iohexol (OMNIPAQUE) 350 MG/ML injection 100 mL (100 mLs Intravenous Contrast Given 02/28/19 1640)  dexamethasone (DECADRON) injection 10 mg (10 mg Intravenous Given 02/28/19 1828)  albuterol (VENTOLIN HFA) 108 (90 Base) MCG/ACT inhaler 2 puff (2 puffs Inhalation Given 02/28/19 1828)    Mobility walks

## 2019-03-01 NOTE — Evaluation (Addendum)
Physical Therapy Evaluation Patient Details Name: Lori Butler MRN: [id number] DOB: [birth year] Today's Date: 03/01/2019   History of Present Illness  Lori Butler is a 68 y.o. female with medical history significant of COPD, malignant neoplasm upper lobe left lung, hypoxia, anxiety/depression, attempted suicide former smoker quit May 2019 (74 pack year hx).  Presented with increasing shortness of breath productive cough for past 2 days he is on 4 L satting 98% when took off oxygen oxygen saturation dropped to 85% noted right foot swelling she was using naproxen for pain. Found to have COPD with acute exacerbation and RLE cellultis.    Clinical Impression  Patient evaluated by Physical Therapy with no further acute PT needs identified. All education has been completed and the patient has no further questions. Patient modified independent for bed mobility, transfers and ambulation with 2L O2 supplemental oxygen. Patient desaturated to 91% O2 without supplemental oxygen and HR of 124 with short ambulation to bathroom. 83% and HR 122 bpm upon return from bathroom without supplemental oxygen. Ambulated 50 feet without supplemental oxygen in hallway with O2 sat decreasing to 90% with 129 bpm HR. 2LO2 initiated at this point. O2 sat increased >90 within 2 minutes but HR remained 105 or greater for 10 minutes post activity. Initially patient refused supplemental oxygen at home but after discussion, patient agreeable to home supplemental oxygen. See below for any follow-up Physical Therapy or equipment needs. PT is signing off. Thank you for this referral.    Follow Up Recommendations No PT follow up    Equipment Recommendations  None recommended by PT    Recommendations for Other Services       Precautions / Restrictions Precautions Precaution Comments: monitor O2 sats (as low as 90% on RA in room with HR up as high at 129 at that point; at rest on 2 liters 95% and HR 105) Restrictions Weight  Bearing Restrictions: No      Mobility  Bed Mobility Overal bed mobility: Modified Independent             General bed mobility comments: HOB up and used one rail  Transfers Overall transfer level: Needs assistance Equipment used: None Transfers: Sit to/from Stand Sit to Stand: Modified independent (Device/Increase time)            Ambulation/Gait Ambulation/Gait assistance: Supervision;Min guard Gait Distance (Feet): 100 Feet Assistive device: None   Gait velocity: decreased   General Gait Details: somewhat slow gait, dyspnea on exertion with patient reaching for railings for support; reports did not feel unsteady just difficulty breathing. Without supplemental oxygen desated to 90% with HR129bpm within 50 feet ambulation  Stairs            Wheelchair Mobility    Modified Rankin (Stroke Patients Only)       Balance Overall balance assessment: Mild deficits observed, not formally tested                                           Pertinent Vitals/Pain Pain Assessment: No/denies pain    Home Living Family/patient expects to be discharged to:: Private residence Living Arrangements: Alone Available Help at Discharge: Family;Available PRN/intermittently Type of Home: Apartment Home Access: Stairs to enter Entrance Stairs-Rails: None(traces hand up wall next to stairs) Entrance Stairs-Number of Steps: 3 Home Layout: One level Home Equipment: Grab bars - tub/shower;Shower seat      Prior  Function Level of Independence: Independent         Comments: reports no falls in the last 6 months     Hand Dominance   Dominant Hand: Right    Extremity/Trunk Assessment   Upper Extremity Assessment Upper Extremity Assessment: Defer to OT evaluation    Lower Extremity Assessment Lower Extremity Assessment: Overall WFL for tasks assessed       Communication   Communication: No difficulties  Cognition Arousal/Alertness:  Awake/alert Behavior During Therapy: WFL for tasks assessed/performed Overall Cognitive Status: Within Functional Limits for tasks assessed                                        General Comments      Exercises     Assessment/Plan    PT Assessment Patent does not need any further PT services  PT Problem List         PT Treatment Interventions      PT Goals (Current goals can be found in the Care Plan section)  Acute Rehab PT Goals Patient Stated Goal: to go home once feeling better Time For Goal Achievement: 03/15/19 Potential to Achieve Goals: Good    Frequency     Barriers to discharge        Co-evaluation               AM-PAC PT "6 Clicks" Mobility  Outcome Measure Help needed turning from your back to your side while in a flat bed without using bedrails?: None Help needed moving from lying on your back to sitting on the side of a flat bed without using bedrails?: None Help needed moving to and from a bed to a chair (including a wheelchair)?: A Little Help needed standing up from a chair using your arms (e.g., wheelchair or bedside chair)?: None Help needed to walk in hospital room?: A Little Help needed climbing 3-5 steps with a railing? : A Little 6 Click Score: 21    End of Session Equipment Utilized During Treatment: Oxygen Activity Tolerance: Patient tolerated treatment well;Patient limited by fatigue Patient left: in chair;with call bell/phone within reach Nurse Communication: Mobility status PT Visit Diagnosis: Unsteadiness on feet (R26.81)    Time: 1430-1510 PT Time Calculation (min) (ACUTE ONLY): 40 min   Charges:   PT Evaluation $PT Eval Moderate Complexity: 1 Mod PT Treatments $Gait Training: 8-22 mins $Self Care/Home Management: 8-22        Lori Butler. Hartnett-Rands, MS, PT Per [place] [phone] 03/01/2019, 3:21 PM

## 2019-03-01 NOTE — Progress Notes (Signed)
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  She was seen by her PCP in the office and was noted to be tachycardic, hypoxic and was sent to the emergency department.  Patient was admitted for the management of COPD exacerbation.  PCCM also consulted by the admitter.  Assessment & Plan:   Active Problems:   COPD exacerbation (HCC)   Hypoxia   Anxiety and depression   Malignant neoplasm of upper lobe of left lung (HCC)   COPD with acute exacerbation (HCC)   Cellulitis of right leg   Acute respiratory failure with hypoxia COPD with acute exacerbation: History of COPD.  Follows with pulmonology, Dr. Elsworth Soho.  Presented with shortness of breath, productive cough.  Started on steroids, bronchodilators, antibiotic.  Has inhalers and nebulizer machine at home. CT angio done in the emergency department did not show any pulmonary embolism. We will see if she qualifies for home oxygen.  Cellulitis of right leg: Right leg is edematous, erythematous.  Continue ceftriaxone for now.  Venous Doppler done today for DVT.  Left upper lobe lung cancer:   Recently finished radiation therapy as per Dr. Lisbeth Renshaw.Follows with Dr Lisbeth Renshaw  Debility/deconditioning: Patient seen by PT and OT and recommended HHPT.         DVT prophylaxis:Lovenox Code Status: Full code Family Communication: Called son on phone and gave update Disposition Plan: Hopeful for Dc tomorrow.  Awaiting improvement in the respiratory status and cellulitis of the right leg.   Consultants: PCCM  Procedures: None  Antimicrobials:  Anti-infectives (From admission, onward)   Start     Dose/Rate Route Frequency Ordered Stop   02/28/19 2130  cefTRIAXone  (ROCEPHIN) 2 g in sodium chloride 0.9 % 100 mL IVPB     2 g 200 mL/hr over 30 Minutes Intravenous Every 24 hours 02/28/19 2035     02/28/19 1530  clindamycin (CLEOCIN) IVPB 600 mg     600 mg 100 mL/hr over 30 Minutes Intravenous  Once 02/28/19 1524 02/28/19 1834      Subjective: Patient seen and examined at bedside this morning.  Medically stable.  More comfortable today.  She feels better.  Not in respiratory distress.  Right lower extremity still swollen and red  Objective: Vitals:   03/01/19 0822 03/01/19 0846 03/01/19 0940 03/01/19 1236  BP:  135/72 133/70 129/78  Pulse:  90 89 98  Resp:  (!) 22 20 (!) 22  Temp:  98.5 F (36.9 C) 97.8 F (36.6 C) 98 F (36.7 C)  TempSrc:   Oral   SpO2: 98% 99% 98% 96%  Weight:      Height:        Intake/Output Summary (Last 24 hours) at 03/01/2019 1301 Last data filed at 03/01/2019 2951 Gross per 24 hour  Intake 147.34 ml  Output 200 ml  Net -52.66 ml   Filed Weights   02/28/19 2153  Weight: 81.6 kg    Examination:  General exam: ,Not in distress,pleasant female  HEENT:PERRL,Oral mucosa moist, Ear/Nose normal on gross exam Respiratory system: Bilateral decreased air entry but no frank wheezes heard Cardiovascular system: S1 & S2 heard, RRR. No JVD, murmurs, rubs, gallops or clicks. Gastrointestinal system: Abdomen is nondistended, soft and nontender. No organomegaly or masses  felt. Normal bowel sounds heard. Central nervous system: Alert and oriented. No focal neurological deficits. Extremities: No clubbing ,no cyanosis, right foot edematous, erythematous, mildly tender Skin: Noulcers,no icterus ,no pallor     Data Reviewed: I have personally reviewed following labs and imaging studies  CBC: Recent Labs  Lab 02/28/19 1441 03/01/19 0516  WBC 9.2 7.2  NEUTROABS 6.1  --   HGB 12.4 13.0  HCT 40.3 43.6  MCV 93.1 95.4  PLT 338 568   Basic Metabolic Panel: Recent Labs  Lab 02/28/19 1441 03/01/19 0516  NA 140 139    K 4.1 4.7  CL 108 105  CO2 24 20*  GLUCOSE 83 108*  BUN 14 15  CREATININE 0.80 0.73  CALCIUM 8.5* 8.7*  MG  --  2.1  PHOS  --  4.3   GFR: Estimated Creatinine Clearance: 70 mL/min (by C-G formula based on SCr of 0.73 mg/dL). Liver Function Tests: Recent Labs  Lab 02/28/19 1441 03/01/19 0516  AST 33 36  ALT 39 39  ALKPHOS 161* 150*  BILITOT 0.5 0.6  PROT 6.6 7.1  ALBUMIN 3.5 3.8   No results for input(s): LIPASE, AMYLASE in the last 168 hours. No results for input(s): AMMONIA in the last 168 hours. Coagulation Profile: No results for input(s): INR, PROTIME in the last 168 hours. Cardiac Enzymes: No results for input(s): CKTOTAL, CKMB, CKMBINDEX, TROPONINI in the last 168 hours. BNP (last 3 results) No results for input(s): PROBNP in the last 8760 hours. HbA1C: No results for input(s): HGBA1C in the last 72 hours. CBG: No results for input(s): GLUCAP in the last 168 hours. Lipid Profile: No results for input(s): CHOL, HDL, LDLCALC, TRIG, CHOLHDL, LDLDIRECT in the last 72 hours. Thyroid Function Tests: Recent Labs    03/01/19 0516  TSH 1.165   Anemia Panel: No results for input(s): VITAMINB12, FOLATE, FERRITIN, TIBC, IRON, RETICCTPCT in the last 72 hours. Sepsis Labs: Recent Labs  Lab 02/28/19 1441 02/28/19 2153 03/01/19 0516  PROCALCITON  --  <0.10  --   LATICACIDVEN 0.9  --  2.1*    Recent Results (from the past 240 hour(s))  SARS CORONAVIRUS 2 (TAT 6-24 HRS) Nasopharyngeal Nasopharyngeal Swab     Status: None   Collection Time: 02/28/19  6:39 PM   Specimen: Nasopharyngeal Swab  Result Value Ref Range Status   SARS Coronavirus 2 NEGATIVE NEGATIVE Final    Comment: (NOTE) SARS-CoV-2 target nucleic acids are NOT DETECTED. The SARS-CoV-2 RNA is generally detectable in upper and lower respiratory specimens during the acute phase of infection. Negative results do not preclude SARS-CoV-2 infection, do not rule out co-infections with other pathogens, and  should not be used as the sole basis for treatment or other patient management decisions. Negative results must be combined with clinical observations, patient history, and epidemiological information. The expected result is Negative. Fact Sheet for Patients: SugarRoll.be Fact Sheet for Healthcare Providers: https://www.woods-mathews.com/ This test is not yet approved or cleared by the [place] FDA and  has been authorized for detection and/or diagnosis of SARS-CoV-2 by FDA under an Emergency Use Authorization (EUA). This EUA will remain  in effect (meaning this test can be used) for the duration of the COVID-19 declaration under Section 56 4(b)(1) of the Act, 21 U.S.C. section 360bbb-3(b)(1), unless the authorization is terminated or revoked sooner. Performed at [facility], [place] [address] [place], [place] [address]   Respiratory Panel by PCR     Status: None   Collection  Time: 02/28/19  9:53 PM   Specimen: Nasopharyngeal Swab; Respiratory  Result Value Ref Range Status   Adenovirus NOT DETECTED NOT DETECTED Final   Coronavirus 229E NOT DETECTED NOT DETECTED Final    Comment: (NOTE) The Coronavirus on the Respiratory Panel, DOES NOT test for the novel  Coronavirus (2019 nCoV)    Coronavirus HKU1 NOT DETECTED NOT DETECTED Final   Coronavirus NL63 NOT DETECTED NOT DETECTED Final   Coronavirus OC43 NOT DETECTED NOT DETECTED Final   Metapneumovirus NOT DETECTED NOT DETECTED Final   Rhinovirus / Enterovirus NOT DETECTED NOT DETECTED Final   Influenza A NOT DETECTED NOT DETECTED Final   Influenza B NOT DETECTED NOT DETECTED Final   Parainfluenza Virus 1 NOT DETECTED NOT DETECTED Final   Parainfluenza Virus 2 NOT DETECTED NOT DETECTED Final   Parainfluenza Virus 3 NOT DETECTED NOT DETECTED Final   Parainfluenza Virus 4 NOT DETECTED NOT DETECTED Final   Respiratory Syncytial Virus NOT DETECTED NOT DETECTED Final   Bordetella  pertussis NOT DETECTED NOT DETECTED Final   Chlamydophila pneumoniae NOT DETECTED NOT DETECTED Final   Mycoplasma pneumoniae NOT DETECTED NOT DETECTED Final    Comment: Performed at [facility], [place] [address] [place], [place] [address]  MRSA PCR Screening     Status: None   Collection Time: 02/28/19  9:53 PM   Specimen: Nasopharyngeal Swab  Result Value Ref Range Status   MRSA by PCR NEGATIVE NEGATIVE Final    Comment:        The GeneXpert MRSA Assay (FDA approved for NASAL specimens only), is one component of a comprehensive MRSA colonization surveillance program. It is not intended to diagnose MRSA infection nor to guide or monitor treatment for MRSA infections. Performed at [facility], [place] [address] [place], [place] [address]          Radiology Studies: Ct Angio Chest Pe W/cm &/or Wo Cm  Result Date: 02/28/2019 CLINICAL DATA:  CONTRAST-100ML OMNI 350 Pt BIB by EMS from PCP office pt has been having increasing SOB , with productive cough past 2 days and is DOE at baseline, Pt reports increasing DOE with position changes. Pt has HX of COPD , Lung Chest pain, complex, intermediate/high prob of ACS/PE/AAS CA pt w R LE swelling and dyspnea w hypoxia w exertion EXAM: CT ANGIOGRAPHY CHEST WITH CONTRAST TECHNIQUE: Multidetector CT imaging of the chest was performed using the standard protocol during bolus administration of intravenous contrast. Multiplanar CT image reconstructions and MIPs were obtained to evaluate the vascular anatomy. CONTRAST:  143mL OMNIPAQUE IOHEXOL 350 MG/ML SOLN COMPARISON:  Chest CT 01/14/2019 FINDINGS: Cardiovascular: No filling defects within the pulmonary arteries to suggest acute pulmonary embolism. No acute findings of the aorta or great vessels. No pericardial fluid. Mediastinum/Nodes: No axillary supraclavicular adenopathy. No mediastinal hilar adenopathy. No pericardial effusion. Esophagus normal. . Lungs/Pleura: Centrilobular  emphysema the upper lobes. Angular nodule in the LEFT upper lobe measuring 1.3 by 0.8 cm compares to 1.4 by 0.9 cm on comparison recent exam remeasured. Lesion was hypermetabolic on comparison PET-CT scan of 10/18/2018. Upper Abdomen: Limited view of the liver, kidneys, pancreas are unremarkable. Normal adrenal glands. Musculoskeletal: No aggressive osseous lesion. Review of the MIP images confirms the above findings. IMPRESSION: 1. No evidence acute pulmonary embolism. 2. Spiculated nodule in the LEFT upper lobe hypermetabolic on comparison PET-CT scan and concerning for bronchogenic carcinoma. 3. Emphysema Electronically Signed   By: Suzy Bouchard M.D.   On: 02/28/2019 17:05  Vas [place] Lower Extremity Venous (dvt) (only Mc & Wl 7a-7p)  Result Date: 03/01/2019  Lower Venous Study Indications: Edema.  Risk Factors: None identified. Limitations: Body habitus and poor ultrasound/tissue interface. Comparison Study: No prior studies. Performing Technologist: [name] Hum RVT  Examination Guidelines: A complete evaluation includes B-mode imaging, spectral Doppler, color Doppler, and power Doppler as needed of all accessible portions of each vessel. Bilateral testing is considered an integral part of a complete examination. Limited examinations for reoccurring indications may be performed as noted.  +---------+---------------+---------+-----------+----------+--------------+  RIGHT     Compressibility Phasicity Spontaneity Properties Thrombus Aging  +---------+---------------+---------+-----------+----------+--------------+  CFV       Full            Yes       Yes                                    +---------+---------------+---------+-----------+----------+--------------+  SFJ       Full                                                             +---------+---------------+---------+-----------+----------+--------------+  FV Prox                   Yes       Yes                                     +---------+---------------+---------+-----------+----------+--------------+  FV Mid    Full                                                             +---------+---------------+---------+-----------+----------+--------------+  FV Distal Full                                                             +---------+---------------+---------+-----------+----------+--------------+  PFV       Full                                                             +---------+---------------+---------+-----------+----------+--------------+  POP       Full            Yes       Yes                                    +---------+---------------+---------+-----------+----------+--------------+  PTV       Full                                                             +---------+---------------+---------+-----------+----------+--------------+  PERO      Full                                                             +---------+---------------+---------+-----------+----------+--------------+   +----+---------------+---------+-----------+----------+--------------+  LEFT Compressibility Phasicity Spontaneity Properties Thrombus Aging  +----+---------------+---------+-----------+----------+--------------+  CFV  Full            Yes       Yes                                    +----+---------------+---------+-----------+----------+--------------+     Summary: Right: There is no evidence of deep vein thrombosis in the lower extremity. However, portions of this examination were limited- see technologist comments above. No cystic structure found in the popliteal fossa. Left: No evidence of common femoral vein obstruction.  *See table(s) above for measurements and observations. Electronically signed by Monica Martinez MD on 03/01/2019 at 10:34:45 AM.    Final         Scheduled Meds:  atorvastatin  10 mg Oral Daily   dextromethorphan-guaiFENesin  2 tablet Oral BID   enoxaparin (LOVENOX) injection  40 mg Subcutaneous Q24H    methylPREDNISolone (SOLU-MEDROL) injection  60 mg Intravenous Q12H   Followed by   Derrill Memo ON 03/02/2019] predniSONE  40 mg Oral Q breakfast   mometasone-formoterol  2 puff Inhalation BID   montelukast  10 mg Oral QHS   pantoprazole  40 mg Oral BID AC   sertraline  50 mg Oral Daily   sodium chloride flush  3 mL Intravenous Q12H   sodium chloride flush  3 mL Intravenous Q12H   [START ON 03/02/2019] umeclidinium bromide  1 puff Inhalation Daily   Continuous Infusions:  sodium chloride     cefTRIAXone (ROCEPHIN)  IV Stopped (02/28/19 2252)     LOS: 1 day    Time spent: 25 mins.More than 50% of that time was spent in counseling and/or coordination of care.      Shelly Coss, MD [facility] Pager [phone]  If 7PM-7AM, please contact night-coverage [email or web address] Password [email or web address] 03/01/2019, 1:01 PM

## 2019-03-01 NOTE — Progress Notes (Signed)
SATURATION QUALIFICATIONS: (This note is used to comply with regulatory documentation for home oxygen)  Patient Saturations on Room Air at Rest = 94% HR 104 bpm  Patient Saturations on Room Air while Ambulating = 83% HR 122 bpm  Patient Saturations on 2 Liters of oxygen while Ambulating = 93% 104 bpm  Please briefly explain why patient needs home oxygen: On RA patient desaturates to below 90% to 83% with HR increasing greater than 100 bpm. Patient requires supplemental oxygen to sustain O2 sats > 90% for activity.  Lori Butler. Hartnett-Rands, MS, PT Per [place] [phone] 03/01/2019

## 2019-03-01 NOTE — Consult Note (Signed)
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     HISTORY OF PRESENT ILLNESS   As above, cp resolved overnight and comfortable lying in bed at initial pulmonary eval 10/24 but congested harsh coughing still with mucoid sputum and very poor hfa/dpi technique noted.  Prior to RT baseline FEV1 47% predicted p Pos resp to SABA with  MMRC3 = can't walk 100 yards even at a slow pace at a flat grade s stopping due to sob .   Onset of cough /sob insidious mid sept 2020 assoc with pred taper. Cough was initially dry but became more mucoid and 24/7 disturbing sleep. Sob at rest did respond to neb saba  Onset of CP was abrupt L flank x 5 days pta    No obvious day to day or daytime variability or assoc purulent sputum or mucus plugs or hemoptysis or  chest tightness, subjective wheeze or overt sinus or hb symptoms.     Also denies any obvious fluctuation of symptoms with weather or environmental changes or other aggravating or alleviating factors except as outlined above   No unusual exposure hx or h/o childhood pna/ asthma or knowledge of premature birth.  Current Allergies, Complete Past Medical History, Past Surgical History, Family History, and Social History were reviewed in [employer] record.  ROS  The following are not active complaints unless bolded Hoarseness, sore throat,  dysphagia, dental problems, itching, sneezing,  nasal congestion or discharge of excess mucus or purulent secretions, ear ache,   fever, chills, sweats, unintended wt loss or wt gain, classically pleuritic or exertional cp,  orthopnea pnd or arm/hand swelling  or leg swelling, presyncope, palpitations, abdominal pain, anorexia, nausea, vomiting, diarrhea  or change in bowel habits or change in bladder habits, change in stools or change in urine, dysuria, hematuria,  rash, arthralgias, visual complaints, headache, numbness, weakness or ataxia or problems with walking or coordination,  change in mood or  memory.             SIGNIFICANT EVENTS:    STUDIES:    CTa 02/28/2019>> 1. No evidence acute pulmonary embolism. 2. Spiculated nodule in the LEFT upper lobe hypermetabolic on comparison PET-CT scan and concerning for bronchogenic carcinoma. 3. Emphysema  CULTURES:  Resp viral panel 02/28/2019 neg  COVID 19 PCR 02/28/2019 neg [facility] pcr   02/28/2019 neg  ANTIBIOTICS:  Rocephin 02/28/19 Clindamycine 02/28/2019    CONSULTANTS:  PCCM 03/01/2019  SUBJECTIVE:  Feeling better with no cp am 10/24 but still very congested sounding cough   CONSTITUTIONAL: BP 130/84 (BP Location: Left Arm)   Pulse 91   Temp 98.3 F (36.8 C)   Resp (!) 22   Ht 5\' 5"  (1.651 m)   Wt 81.6 kg   SpO2 98%   BMI 29.95 kg/m   I/O last  3 completed shifts: In: 147.3 [IV Piggyback:147.3] Out: 200 [Urine:200]        PHYSICAL EXAM:  Elderly wf with congested sounding cough on 4lpm NP   Pt alert, approp nad @ 30 degrees hob No jvd Oropharynx clear,  mucosa nl Neck supple Lungs   exp > insp rhonchi bilaterally RRR no s3 or or sign murmur Abd soft with nl excursion  Extr warm with no  clubbing noted - trace pitting R > L with celluitic changes R dorsum of foot  Neuro  Sensorium nl ,  no apparent motor deficits       RESOLVED PROBLEM LIST   ASSESSMENT AND PLAN   1) COPD exac due to discontinuation  of oral prednisone in setting of very poor hfa technique >>>> rec agree with pred/abx,  Will add mucinex dm and flutter valve and work on either teaching and achieving better hfa or change to dpi (if cough better) or neb ics/laba another option depending on insurance.   DDX of  difficult airways management almost all start with A and  include Adherence, Ace Inhibitors, Acid Reflux, Active Sinus Disease, Alpha 1 Antitripsin deficiency, Anxiety masquerading as Airways dz,  ABPA,  Allergy(esp in young), Aspiration (esp in elderly), Adverse effects of meds,  Active smoking or vaping, A bunch of PE's (a small clot burden can't cause this syndrome unless there is already severe underlying pulm or vascular dz with poor reserve) plus two Bs  = Bronchiectasis and Beta blocker use..and one C= CHF    Adherence is always the initial "prime suspect" and is a multilayered concern that requires a "trust but verify" approach in every patient - starting with knowing how to use medications, especially inhalers, correctly, keeping up with refills and understanding the fundamental difference between maintenance and prns vs those medications only taken for a very short course and then stopped and not refilled.  - see hfa teaching concerns above - return to office with all meds in hand using a trust but verify approach to confirm accurate Medication  Reconciliation The principal here is that until we are certain that the  patients are doing what we've asked, it makes no sense to ask them to do more.   ? Acid (or non-acid) GERD > always difficult to exclude as up to 75% of pts in some series report no assoc GI/ Heartburn symptoms> rec max (24h)  acid suppression and diet restrictions/ reviewed and instructions given in writing.  >>> strongly rec not using fosfamax in this setting but rather prolia or reclast  ? Allergy/ clearly has big asthmatic component but can probably be managed by learning ICS  > very careful pred taper  needed with freq checks as taper   ? Alpha one AT def > need to check phenotype.   ? Adverse drug effects >  spiriva dpi is possible concern in that based on her technique today most of it ended up in her mouth, not her lungs.  None of the other usual suspects listed. No evidence of RT pneumonitis though this was previous concern  ? Anxiety > usually at the bottom of this list of usual suspects but should be included  higher on this pt's based on H and P and note already on psychotropics and may interfere with adherence and also interpretation of response or lack thereof to symptom management which can be quite subjective.   ? A bunch of PEs > note CTa neg    ? Active smoking >  denies   ? CHF > bnp of less than 100 rules out    2) intermittent L CW pain  - suspect this is MSCP from coughing so rec address this first   3) new Hypoxemic Resp failure > will likely need 02 on d/c but will need to be address then. In meantime rx to keep sats > 90% as planned     SUMMARY OF TODAY'S PLAN:  rx as aecopd / focus on cough control   LABS  Glucose No results for input(s): GLUCAP in the last 168 hours.  BMET Recent Labs  Lab 02/28/19 1441 03/01/19 0516  NA 140 139  K 4.1 4.7  CL 108 105  CO2 24 20*  BUN 14 15  CREATININE 0.80 0.73  GLUCOSE 83 108*    Liver Enzymes Recent Labs  Lab 02/28/19 1441 03/01/19 0516  AST 33 36  ALT 39 39  ALKPHOS 161* 150*  BILITOT 0.5 0.6  ALBUMIN 3.5 3.8    Electrolytes Recent Labs  Lab 02/28/19 1441 03/01/19 0516  CALCIUM 8.5* 8.7*  MG  --  2.1  PHOS  --  4.3    CBC Recent Labs  Lab 02/28/19 1441 03/01/19 0516  WBC 9.2 7.2  HGB 12.4 13.0  HCT 40.3 43.6  PLT 338 301    ABG No results for input(s): PHART, PCO2ART, PO2ART in the last 168 hours.  Coag's No results for input(s): APTT, INR in the last 168 hours.  Sepsis Markers Recent Labs  Lab 02/28/19 1441 02/28/19 2153 03/01/19 0516  LATICACIDVEN 0.9  --  2.1*   PROCALCITON  --  <0.10  --     Cardiac Enzymes No results for input(s): TROPONINI, PROBNP in the last 168 hours.  PAST MEDICAL HISTORY :   She  has a past medical history of Acute respiratory failure with hypoxia ([place]), Anxiety, Aortic atherosclerosis ([place]), Arthritis, COPD (chronic obstructive pulmonary disease) ([place]), COPD exacerbation ([place]), Depression, History of kidney stones, and Suicide attempt ([place]) (02/21/2018).  PAST SURGICAL HISTORY:  She  has a past surgical history that includes Appendectomy; Tonsillectomy; Breast biopsy (Left, 1990s); Cataract extraction w/ intraocular lens  implant, bilateral (Bilateral); Abdominal hysterectomy; Dilation and curettage of uterus; Tubal ligation; Video bronchoscopy with endobronchial navigation (N/A, 11/11/2018); and Fuducial placement (N/A, 11/11/2018).  Allergies  Allergen Reactions  . Demerol [Meperidine] Other (See Comments)    "horrible reaction"  . Oxycodone-Acetaminophen Other (See Comments)    Upset stomach   . Tramadol Nausea And Vomiting    No current facility-administered medications on file prior to encounter.    Current Outpatient Medications on File Prior to Encounter  Medication Sig  . albuterol (PROVENTIL HFA;VENTOLIN HFA) 108 (90 Base) MCG/ACT inhaler Inhale 2 puffs into the lungs every 6 (six) hours as needed for wheezing or shortness of breath.  Marland Kitchen albuterol (PROVENTIL) (2.5 MG/3ML) 0.083% nebulizer solution Take 3 mLs (2.5 mg total) by nebulization every 6 (six) hours as needed for wheezing or shortness of breath.  Marland Kitchen alendronate (FOSAMAX) 70 MG tablet Take 70 mg by mouth every Wednesday.   Marland Kitchen atorvastatin (LIPITOR) 10 MG tablet Take 10 mg by mouth daily.  . benzonatate (TESSALON) 200 MG capsule Take 1 capsule (200 mg total) by mouth 3 (three) times daily as needed for cough.  . budesonide-formoterol (SYMBICORT) 160-4.5 MCG/ACT inhaler Inhale 2 puffs into the lungs 2 (two) times daily.  . Cholecalciferol (VITAMIN D) 50 MCG  (2000 UT) tablet Take 2,000 Units by mouth daily.  Marland Kitchen  guaiFENesin (MUCINEX) 600 MG 12 hr tablet Take 2 tablets (1,200 mg total) by mouth 2 (two) times daily. (Patient taking differently: Take 600 mg by mouth 2 (two) times daily. )  . montelukast (SINGULAIR) 10 MG tablet Take 1 tablet (10 mg total) by mouth at bedtime.  . naproxen (NAPROSYN) 375 MG tablet Take 1 tablet (375 mg total) by mouth 2 (two) times daily. (Patient taking differently: Take 375 mg by mouth 2 (two) times daily as needed for moderate pain. )  . pantoprazole (PROTONIX) 40 MG tablet Take 1 tablet (40 mg total) by mouth 2 (two) times daily. (Patient taking differently: Take 40 mg by mouth daily. )  . sertraline (ZOLOFT) 50 MG tablet Take 1 tablet (50 mg total) by mouth daily.  Marland Kitchen tiotropium (SPIRIVA) 18 MCG inhalation capsule Place 18 mcg into inhaler and inhale daily.  Marland Kitchen doxycycline (VIBRA-TABS) 100 MG tablet Take 1 tablet (100 mg total) by mouth 2 (two) times daily. (Patient not taking: Reported on 02/28/2019)  . predniSONE (DELTASONE) 10 MG tablet 40mg  x 3 days; 30mg  x 3 days; 20mg  x 3 days; then STAY on 10 mg daily until stopped or changed by provider (Patient not taking: Reported on 02/28/2019)  . promethazine-codeine (PHENERGAN WITH CODEINE) 6.25-10 MG/5ML syrup Take 5 mLs by mouth every 6 (six) hours as needed for cough. (Patient not taking: Reported on 02/28/2019)  . tiotropium (SPIRIVA HANDIHALER) 18 MCG inhalation capsule Place 1 capsule (18 mcg total) into inhaler and inhale daily.

## 2019-03-01 NOTE — Progress Notes (Signed)
Echocardiogram 2D Echocardiogram has been performed.  Oneal Deputy Chi Woodham 03/01/2019, 8:22 AM

## 2019-03-01 NOTE — Progress Notes (Signed)
CRITICAL VALUE ALERT  Critical Value: Lactic acid 2.1  Date & Time Notied:  03/01/19 0700  Provider Notified: A. Adhikari  Orders Received/Actions taken: Waiting on reply

## 2019-03-01 NOTE — Evaluation (Signed)
Occupational Therapy Evaluation Patient Details Name: Lori Butler MRN: [id number] DOB: [birth year] Today's Date: 03/01/2019    History of Present Illness Lori Butler is a 68 y.o. female with medical history significant of COPD, malignant neoplasm upper lobe left lung, hypoxia, anxiety/depression, attempted suicide former smoker quit May 2019 (74 pack year hx).  Presented with increasing shortness of breath productive cough for past 2 days he is on 4 L satting 98% when took off oxygen oxygen saturation dropped to 85% noted right foot swelling she was using naproxen for pain. Found to have COPD with acute exacerbation and RLE cellultis   Clinical Impression   This 68 yo female admitted with above presents to acute OT with increased work of breathing and decreased sats when on RA with activity. Pt is normally independent with ADLs and most IADLs without AD in her home, currently she is min guard A to S. She will benefit from acute OT with follow up [place] and intermittent S.  At rest upon entry pt on 4 liters and with sats 96%/HR 96; on 2 liters with sitting up on EOB sats 95%/HR 106; on RA after coming back from bathroom sats 89%/HR 115; placed pt back on 2 liters at end of session with sats at 96%/HR 100.    Follow Up Recommendations  Home health OT;Supervision/Assistance - 24 hour    Equipment Recommendations  None recommended by OT       Precautions / Restrictions Precautions Precaution Comments: monitor O2 sats (as low as 89% on RA in room with HR up as high at 116 at that point; at rest on 2 liters 95% and HR 96) Restrictions Weight Bearing Restrictions: No      Mobility Bed Mobility Overal bed mobility: Modified Independent             General bed mobility comments: HOB up and used one rail  Transfers Overall transfer level: Needs assistance Equipment used: None Transfers: Sit to/from Stand Sit to Stand: Supervision         General transfer comment: Ambulation  with min guard A no AD    Balance Overall balance assessment: Mild deficits observed, not formally tested                                         ADL either performed or assessed with clinical judgement   ADL Overall ADL's : Needs assistance/impaired Eating/Feeding: Independent;Sitting   Grooming: Supervision/safety;Standing;Wash/dry hands   Upper Body Bathing: Supervision/ safety;Sitting;Standing   Lower Body Bathing: Supervison/ safety;Sit to/from stand   Upper Body Dressing : Supervision/safety;Sitting;Standing   Lower Body Dressing: Supervision/safety;Sit to/from stand   Toilet Transfer: Min guard;Ambulation;Regular Toilet;Grab bars   Toileting- Clothing Manipulation and Hygiene: Supervision/safety;Sit to/from stand         General ADL Comments: Educated pt on purse lipped breathing, not keeping her hands in hot water when hand washing dishes, not taking hot/steamy showers (should be cooler, bathroom door open, and vent fan running), sitting to do as many tasks as she can. Gave pt energy conservation handout     Vision Patient Visual Report: No change from baseline              Pertinent Vitals/Pain Pain Assessment: No/denies pain     Hand Dominance Right   Extremity/Trunk Assessment Upper Extremity Assessment Upper Extremity Assessment: Overall WFL for tasks assessed  Communication Communication Communication: No difficulties   Cognition Arousal/Alertness: Awake/alert Behavior During Therapy: WFL for tasks assessed/performed Overall Cognitive Status: Within Functional Limits for tasks assessed                                                Home Living Family/patient expects to be discharged to:: Private residence Living Arrangements: Alone Available Help at Discharge: Family;Available PRN/intermittently Type of Home: Apartment Home Access: Stairs to enter     Home Layout: One level     Bathroom  Shower/Tub: Tub/shower unit;Curtain   Biochemist, clinical: Standard     Home Equipment: Grab bars - tub/shower;Shower seat;Hand held shower head          Prior Functioning/Environment Level of Independence: Independent                 OT Problem List: Decreased activity tolerance;Obesity;Cardiopulmonary status limiting activity      OT Treatment/Interventions: Self-care/ADL training;Energy conservation;Patient/family education;Balance training    OT Goals(Current goals can be found in the care plan section) Acute Rehab OT Goals Patient Stated Goal: to go home once feeling better OT Goal Formulation: With patient Time For Goal Achievement: 03/15/19 Potential to Achieve Goals: Good  OT Frequency: Min 2X/week              AM-PAC OT "6 Clicks" Daily Activity     Outcome Measure Help from another person eating meals?: None Help from another person taking care of personal grooming?: A Little Help from another person toileting, which includes using toliet, bedpan, or urinal?: A Little Help from another person bathing (including washing, rinsing, drying)?: A Little Help from another person to put on and taking off regular upper body clothing?: A Little Help from another person to put on and taking off regular lower body clothing?: A Little 6 Click Score: 19   End of Session Nurse Communication: (Asked if pt was OK to stay on 2 liters with sats 96% (RN said that was fine), pt needs flutter valve and inspirometer, I will ask PT to do ambulatory O2 qualification note.)  Activity Tolerance: Patient tolerated treatment well(with O2 on, not so well on RA) Patient left: in chair;with call bell/phone within reach;with chair alarm set  OT Visit Diagnosis: Unsteadiness on feet (R26.81);Muscle weakness (generalized) (M62.81)                Time: 6269-4854 OT Time Calculation (min): 26 min Charges:  OT General Charges $OT Visit: 1 Visit OT Evaluation $OT Eval Moderate Complexity: 1  Mod OT Treatments $Self Care/Home Management : 8-22 mins  [place], OTR/L Acute [employer] Pager [phone] Office [phone]     Almon Register 03/01/2019, 1:08 PM

## 2019-03-02 DIAGNOSIS — F329 Major depressive disorder, single episode, unspecified: Secondary | ICD-10-CM

## 2019-03-02 DIAGNOSIS — L03115 Cellulitis of right lower limb: Secondary | ICD-10-CM

## 2019-03-02 DIAGNOSIS — C3412 Malignant neoplasm of upper lobe, left bronchus or lung: Secondary | ICD-10-CM

## 2019-03-02 DIAGNOSIS — F419 Anxiety disorder, unspecified: Secondary | ICD-10-CM

## 2019-03-02 LAB — LACTIC ACID, PLASMA: Lactic Acid, Venous: 1 mmol/L (ref 0.5–1.9)

## 2019-03-02 NOTE — Progress Notes (Signed)
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      SIGNIFICANT EVENTS:    STUDIES:    CTa 02/28/2019>> 1. No evidence acute pulmonary embolism. 2. Spiculated nodule in the LEFT upper lobe hypermetabolic on comparison PET-CT scan and concerning for bronchogenic carcinoma. 3. Emphysema s evidence of RT or any other form of pneumonitis  CULTURES:  Resp viral panel 02/28/2019 neg  COVID 19 PCR 02/28/2019 neg [facility] PCR  02/28/2019 neg  ANTIBIOTICS:  Rocephin 02/28/19 Clindamycine 02/28/2019    CONSULTANTS:  PCCM 03/01/2019  SUBJECTIVE:  Sitting in chair, says coughing up lots of white mucus, learning how to use flutter and hfa better / much better spirits today and wants to go home   CONSTITUTIONAL: BP (!) 153/71 (BP Location: Right Arm)   Pulse (!) 101   Temp 98 F (36.7 C) (Oral)   Resp 18   Ht 5\' 5"  (1.651 m)   Wt 82.5 kg   SpO2 94%   BMI 30.27 kg/m   2lpm NP  I/O last 3 completed shifts: In: 557.3 [P.O.:360; IV Piggyback:197.3] Out: 700 [Urine:700]        PHYSICAL EXAM:   eldelry wf sitting in chair/ congested sounding cough  Pt alert, approp nad @ upright position  No jvd Oropharynx clear,  mucosa nl Neck supple Lungs  scattered exp > insp rhonchi bilaterally RRR no s3 or or sign murmur Abd soft, nl excursion  Extr warm with  Improved rash on R         RESOLVED PROBLEM LIST    ASSESSMENT AND PLAN   1) COPD exac due to discontinuation of oral prednisone in setting of very poor hfa technique.  - see hfa teaching concerns above - return to office with all meds in hand using a trust but verify approach to confirm accurate Medication  Reconciliation The principal here is that until we are certain that the  patients are doing what we've asked, it makes no sense to ask them to do more.   ? Acid (or non-acid) GERD > always difficult to exclude as up to 75% of pts in some series report no assoc GI/ Heartburn symptoms> rec max (24h)  acid suppression and diet restrictions/ reviewed and instructions given in writing.  >>> strongly rec not using fosfamax in this setting but rather prolia or reclast ? Alpha one AT def > need to check phenotype> Pending  ? Adverse drug effects >  spiriva dpi is possible concern in that based on her technique today most of it ended up in her mouth, not her lungs.  None of the other usual suspects listed. No evidence of RT pneumonitis though this was previous concern      2) intermittent L CW pain  - suspect this is MSCP from coughing so rec address this first - improved since  admit    3) new Hypoxemic Resp failure > will likely need 02 on d/c but will need to be address then. In meantime rx to keep sats > 90% as planned     SUMMARY OF TODAY'S PLAN:  Continue rx for aecopd / teach flutter and hfa using teach back technique  LABS  Glucose No results for input(s): GLUCAP in the last 168 hours.  BMET Recent Labs  Lab 02/28/19 1441 03/01/19 0516  NA 140 139  K 4.1 4.7  CL 108 105  CO2 24 20*  BUN 14 15  CREATININE 0.80 0.73  GLUCOSE 83 108*    Liver Enzymes Recent Labs  Lab 02/28/19 1441 03/01/19 0516  AST 33 36  ALT 39 39  ALKPHOS 161* 150*  BILITOT 0.5 0.6  ALBUMIN 3.5 3.8    Electrolytes Recent Labs  Lab 02/28/19 1441 03/01/19 0516  CALCIUM 8.5* 8.7*  MG  --  2.1  PHOS  --  4.3    CBC Recent Labs  Lab  02/28/19 1441 03/01/19 0516  WBC 9.2 7.2  HGB 12.4 13.0  HCT 40.3 43.6  PLT 338 301    ABG No results for input(s): PHART, PCO2ART, PO2ART in the last 168 hours.  Coag's No results for input(s): APTT, INR in the last 168 hours.  Sepsis Markers Recent Labs  Lab 02/28/19 1441 02/28/19 2153 03/01/19 0516 03/02/19 0439  LATICACIDVEN 0.9  --  2.1* 1.0  PROCALCITON  --  <0.10  --   --      30 min visit with pt reviewing options for outpt rx to include possibly changing to [employer] and holding off fosfamax as outpt for now and considering IV reclast or sq prolia instead.  Christinia Gully, MD Pulmonary and [place] [phone] After 5:30 PM or weekends, use [facility] [phone]

## 2019-03-02 NOTE — Plan of Care (Addendum)
Pt's O2 needs have decreased from 4 LPM to 2 LPM. Pt has remained above 90%. Pt encouraged to use flutter valve and incentive spirometer.

## 2019-03-02 NOTE — Progress Notes (Signed)
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  She was seen by her PCP in the office and was noted to be tachycardic, hypoxic and was sent to the emergency department.  Patient was admitted for the management of COPD exacerbation.  PCCM also consulted by the admitter.   Assessment & Plan:   Active Problems:   COPD exacerbation (HCC)   Hypoxia   Anxiety and depression   Malignant neoplasm of upper lobe of left lung (HCC)   COPD with acute exacerbation (HCC)   Cellulitis of right leg   Acute respiratory failure with hypoxia COPD with acute exacerbation: History of COPD.  Follows with pulmonology, Dr. Elsworth Soho.  Presented with shortness of breath, productive cough. c/w steroids, bronchodilators, antibiotic.  Has inhalers and nebulizer machine at home. CT angio done in the emergency department did not show any pulmonary embolism. pulm was consulted, recs continued inpt care 2/2 acute on chronic resp failure  Cellulitis of right leg: Right leg is edematous, erythematous but improving, nl wbc.  Continue ceftriaxone for now.  Venous Doppler done and neg  for DVT.  Left upper lobe lung cancer:   Recently finished radiation therapy as per Dr. Lisbeth Renshaw.Follows with Dr Lisbeth Renshaw  Debility/deconditioning: Patient seen by PT and OT and recommended HHPT.  DVT prophylaxis: Lovenox SQ  Code Status: Full    Code Status Orders  (From admission, onward)         Start     Ordered   03/01/19 0112  Full code  Continuous     03/01/19 0112        Code Status History    Date Active Date Inactive Code Status Order ID Comments User Context   05/05/2018 1916 05/08/2018 1536 Full Code [id number]  Lenore Cordia, MD ED   02/26/2018  2108 02/28/2018 1437 Full Code [id number]  Clovis Fredrickson, MD Inpatient   02/21/2018 1717 02/26/2018 2103 Full Code [id number]  Thurnell Lose, MD Inpatient   12/24/2017 1156 12/26/2017 2036 Full Code [id number]  Marijean Heath, NP ED   12/09/2017 1050 12/11/2017 1859 Full Code [id number]  Radene Gunning, NP ED   10/30/2017 0044 11/02/2017 1643 Full Code [id number]  Rise Patience, MD Inpatient   09/15/2017 1737 09/16/2017 2019 Full Code [id number]  Nita Sells, MD Inpatient   Advance Care Planning Activity     Family Communication: Discussed in detail with patient Disposition Plan:   Patient remained for continued steroids, IV antibiotics, expert subspecialty consultation with pulmonology.  Patient not medically stable for discharge Consults called: None Admission status: Inpatient   Consultants:   pulmonology  Procedures:  Dg Chest 2 View  Result Date: 02/19/2019 CLINICAL DATA:  Patient reports to the ED for coughing. Patient last had chemo August 14th. Patient reports she has been coughing so hard that she "thinks she may have cracked a rib" hx copd, lung cancer, ex-smoker EXAM: CHEST - 2 VIEW COMPARISON:  11/11/2018 FINDINGS: Cardiac silhouette is normal in size. No mediastinal or hilar masses. No evidence of adenopathy. Three surgical vascular clips project above and lateral to the left hilum new since the prior radiographs, but stable from a CT dated 01/24/2019. There is opacity extending above the left hilum in the  medial left upper lobe that is also unchanged from the prior CT. Lungs are hyperexpanded but otherwise clear. No pleural effusion.  No pneumothorax. Skeletal structures are intact.  No visualized rib fracture. IMPRESSION: 1. No acute cardiopulmonary disease. 2. Left upper lobe opacity medially, above the left hilum, consistent with scarring from treatment of prior lung carcinoma. Active neoplastic disease is not excluded. 3. Lung hyperexpansion consistent with  COPD. Electronically Signed   By: Lajean Manes M.D.   On: 02/19/2019 13:21   Ct Angio Chest Pe W/cm &/or Wo Cm  Result Date: 02/28/2019 CLINICAL DATA:  CONTRAST-100ML OMNI 350 Pt BIB by EMS from PCP office pt has been having increasing SOB , with productive cough past 2 days and is DOE at baseline, Pt reports increasing DOE with position changes. Pt has HX of COPD , Lung Chest pain, complex, intermediate/high prob of ACS/PE/AAS CA pt w R LE swelling and dyspnea w hypoxia w exertion EXAM: CT ANGIOGRAPHY CHEST WITH CONTRAST TECHNIQUE: Multidetector CT imaging of the chest was performed using the standard protocol during bolus administration of intravenous contrast. Multiplanar CT image reconstructions and MIPs were obtained to evaluate the vascular anatomy. CONTRAST:  165mL OMNIPAQUE IOHEXOL 350 MG/ML SOLN COMPARISON:  Chest CT 01/14/2019 FINDINGS: Cardiovascular: No filling defects within the pulmonary arteries to suggest acute pulmonary embolism. No acute findings of the aorta or great vessels. No pericardial fluid. Mediastinum/Nodes: No axillary supraclavicular adenopathy. No mediastinal hilar adenopathy. No pericardial effusion. Esophagus normal. . Lungs/Pleura: Centrilobular emphysema the upper lobes. Angular nodule in the LEFT upper lobe measuring 1.3 by 0.8 cm compares to 1.4 by 0.9 cm on comparison recent exam remeasured. Lesion was hypermetabolic on comparison PET-CT scan of 10/18/2018. Upper Abdomen: Limited view of the liver, kidneys, pancreas are unremarkable. Normal adrenal glands. Musculoskeletal: No aggressive osseous lesion. Review of the MIP images confirms the above findings. IMPRESSION: 1. No evidence acute pulmonary embolism. 2. Spiculated nodule in the LEFT upper lobe hypermetabolic on comparison PET-CT scan and concerning for bronchogenic carcinoma. 3. Emphysema Electronically Signed   By: Suzy Bouchard M.D.   On: 02/28/2019 17:05   Vas [place] Lower Extremity Venous (dvt) (only Mc & Wl  7a-7p)  Result Date: 03/01/2019  Lower Venous Study Indications: Edema.  Risk Factors: None identified. Limitations: Body habitus and poor ultrasound/tissue interface. Comparison Study: No prior studies. Performing Technologist: [name] Hum RVT  Examination Guidelines: A complete evaluation includes B-mode imaging, spectral Doppler, color Doppler, and power Doppler as needed of all accessible portions of each vessel. Bilateral testing is considered an integral part of a complete examination. Limited examinations for reoccurring indications may be performed as noted.  +---------+---------------+---------+-----------+----------+--------------+  RIGHT     Compressibility Phasicity Spontaneity Properties Thrombus Aging  +---------+---------------+---------+-----------+----------+--------------+  CFV       Full            Yes       Yes                                    +---------+---------------+---------+-----------+----------+--------------+  SFJ       Full                                                             +---------+---------------+---------+-----------+----------+--------------+  FV Prox                   Yes       Yes                                    +---------+---------------+---------+-----------+----------+--------------+  FV Mid    Full                                                             +---------+---------------+---------+-----------+----------+--------------+  FV Distal Full                                                             +---------+---------------+---------+-----------+----------+--------------+  PFV       Full                                                             +---------+---------------+---------+-----------+----------+--------------+  POP       Full            Yes       Yes                                    +---------+---------------+---------+-----------+----------+--------------+  PTV       Full                                                              +---------+---------------+---------+-----------+----------+--------------+  PERO      Full                                                             +---------+---------------+---------+-----------+----------+--------------+   +----+---------------+---------+-----------+----------+--------------+  LEFT Compressibility Phasicity Spontaneity Properties Thrombus Aging  +----+---------------+---------+-----------+----------+--------------+  CFV  Full            Yes       Yes                                    +----+---------------+---------+-----------+----------+--------------+     Summary: Right: There is no evidence of deep vein thrombosis in the lower extremity. However, portions of this examination were limited- see technologist comments above. No cystic structure found in the popliteal fossa. Left: No evidence of common femoral vein obstruction.  *See table(s) above for measurements and observations. Electronically signed by Harrell Gave  Clark MD on 03/01/2019 at 10:34:45 AM.    Final      Antimicrobials:   Ceftriaxone 10/23   Subjective: No acute changes overnight Still reports feeling short of breath with minimal exertion This limits ADLs  Objective: Vitals:   03/02/19 0555 03/02/19 0835 03/02/19 0837 03/02/19 1240  BP:    123/70  Pulse:    (!) 105  Resp:    18  Temp:    99.2 F (37.3 C)  TempSrc:    Oral  SpO2:  94% 94% 97%  Weight: 82.5 kg     Height:        Intake/Output Summary (Last 24 hours) at 03/02/2019 1244 Last data filed at 03/02/2019 1240 Gross per 24 hour  Intake 940 ml  Output 700 ml  Net 240 ml   Filed Weights   02/28/19 2153 03/02/19 0555  Weight: 81.6 kg 82.5 kg    Examination:  General exam: Appears calm and comfortable although short of breath Respiratory system: Rhonchi bilaterally, wheezing bilaterally, no accessory muscle use Cardiovascular system: S1 & S2 heard, RRR. No JVD, murmurs, rubs, gallops or clicks. No pedal edema. Gastrointestinal  system: Abdomen is nondistended, soft and nontender. No organomegaly or masses felt. Normal bowel sounds heard. Central nervous system: Alert and oriented. No focal neurological deficits. Extremities: Warm well perfused, no edema no contractures Skin: No rashes, lesions or ulcers Psychiatry: Judgement and insight appear normal. Mood & affect appropriate.     Data Reviewed: I have personally reviewed following labs and imaging studies  CBC: Recent Labs  Lab 02/28/19 1441 03/01/19 0516  WBC 9.2 7.2  NEUTROABS 6.1  --   HGB 12.4 13.0  HCT 40.3 43.6  MCV 93.1 95.4  PLT 338 637   Basic Metabolic Panel: Recent Labs  Lab 02/28/19 1441 03/01/19 0516  NA 140 139  K 4.1 4.7  CL 108 105  CO2 24 20*  GLUCOSE 83 108*  BUN 14 15  CREATININE 0.80 0.73  CALCIUM 8.5* 8.7*  MG  --  2.1  PHOS  --  4.3   GFR: Estimated Creatinine Clearance: 70.4 mL/min (by C-G formula based on SCr of 0.73 mg/dL). Liver Function Tests: Recent Labs  Lab 02/28/19 1441 03/01/19 0516  AST 33 36  ALT 39 39  ALKPHOS 161* 150*  BILITOT 0.5 0.6  PROT 6.6 7.1  ALBUMIN 3.5 3.8   No results for input(s): LIPASE, AMYLASE in the last 168 hours. No results for input(s): AMMONIA in the last 168 hours. Coagulation Profile: No results for input(s): INR, PROTIME in the last 168 hours. Cardiac Enzymes: No results for input(s): CKTOTAL, CKMB, CKMBINDEX, TROPONINI in the last 168 hours. BNP (last 3 results) No results for input(s): PROBNP in the last 8760 hours. HbA1C: No results for input(s): HGBA1C in the last 72 hours. CBG: No results for input(s): GLUCAP in the last 168 hours. Lipid Profile: No results for input(s): CHOL, HDL, LDLCALC, TRIG, CHOLHDL, LDLDIRECT in the last 72 hours. Thyroid Function Tests: Recent Labs    03/01/19 0516  TSH 1.165   Anemia Panel: No results for input(s): VITAMINB12, FOLATE, FERRITIN, TIBC, IRON, RETICCTPCT in the last 72 hours. Sepsis Labs: Recent Labs  Lab  02/28/19 1441 02/28/19 2153 03/01/19 0516 03/02/19 0439  PROCALCITON  --  <0.10  --   --   LATICACIDVEN 0.9  --  2.1* 1.0    Recent Results (from the past 240 hour(s))  SARS CORONAVIRUS 2 (TAT 6-24 HRS) Nasopharyngeal Nasopharyngeal Swab  Status: None   Collection Time: 02/28/19  6:39 PM   Specimen: Nasopharyngeal Swab  Result Value Ref Range Status   SARS Coronavirus 2 NEGATIVE NEGATIVE Final    Comment: (NOTE) SARS-CoV-2 target nucleic acids are NOT DETECTED. The SARS-CoV-2 RNA is generally detectable in upper and lower respiratory specimens during the acute phase of infection. Negative results do not preclude SARS-CoV-2 infection, do not rule out co-infections with other pathogens, and should not be used as the sole basis for treatment or other patient management decisions. Negative results must be combined with clinical observations, patient history, and epidemiological information. The expected result is Negative. Fact Sheet for Patients: SugarRoll.be Fact Sheet for Healthcare Providers: https://www.woods-mathews.com/ This test is not yet approved or cleared by the [place] FDA and  has been authorized for detection and/or diagnosis of SARS-CoV-2 by FDA under an Emergency Use Authorization (EUA). This EUA will remain  in effect (meaning this test can be used) for the duration of the COVID-19 declaration under Section 56 4(b)(1) of the Act, 21 U.S.C. section 360bbb-3(b)(1), unless the authorization is terminated or revoked sooner. Performed at [facility], [place] [address], [place], [place] [address]   Respiratory Panel by PCR     Status: None   Collection Time: 02/28/19  9:53 PM   Specimen: Nasopharyngeal Swab; Respiratory  Result Value Ref Range Status   Adenovirus NOT DETECTED NOT DETECTED Final   Coronavirus 229E NOT DETECTED NOT DETECTED Final    Comment: (NOTE) The Coronavirus on the Respiratory Panel, DOES  NOT test for the novel  Coronavirus (2019 nCoV)    Coronavirus HKU1 NOT DETECTED NOT DETECTED Final   Coronavirus NL63 NOT DETECTED NOT DETECTED Final   Coronavirus OC43 NOT DETECTED NOT DETECTED Final   Metapneumovirus NOT DETECTED NOT DETECTED Final   Rhinovirus / Enterovirus NOT DETECTED NOT DETECTED Final   Influenza A NOT DETECTED NOT DETECTED Final   Influenza B NOT DETECTED NOT DETECTED Final   Parainfluenza Virus 1 NOT DETECTED NOT DETECTED Final   Parainfluenza Virus 2 NOT DETECTED NOT DETECTED Final   Parainfluenza Virus 3 NOT DETECTED NOT DETECTED Final   Parainfluenza Virus 4 NOT DETECTED NOT DETECTED Final   Respiratory Syncytial Virus NOT DETECTED NOT DETECTED Final   Bordetella pertussis NOT DETECTED NOT DETECTED Final   Chlamydophila pneumoniae NOT DETECTED NOT DETECTED Final   Mycoplasma pneumoniae NOT DETECTED NOT DETECTED Final    Comment: Performed at [facility], [place]. [address] [place], [place] [address]  MRSA PCR Screening     Status: None   Collection Time: 02/28/19  9:53 PM   Specimen: Nasopharyngeal Swab  Result Value Ref Range Status   MRSA by PCR NEGATIVE NEGATIVE Final    Comment:        The GeneXpert MRSA Assay (FDA approved for NASAL specimens only), is one component of a comprehensive MRSA colonization surveillance program. It is not intended to diagnose MRSA infection nor to guide or monitor treatment for MRSA infections. Performed at [facility], [place] [address], [place], [place] [address]          Radiology Studies: Ct Angio Chest Pe W/cm &/or Wo Cm  Result Date: 02/28/2019 CLINICAL DATA:  CONTRAST-100ML OMNI 350 Pt BIB by EMS from PCP office pt has been having increasing SOB , with productive cough past 2 days and is DOE at baseline, Pt reports increasing DOE with position changes. Pt has HX of COPD , Lung Chest pain,  complex, intermediate/high prob of ACS/PE/AAS CA pt w R LE swelling and dyspnea w hypoxia w  exertion EXAM: CT ANGIOGRAPHY CHEST WITH CONTRAST TECHNIQUE: Multidetector CT imaging of the chest was performed using the standard protocol during bolus administration of intravenous contrast. Multiplanar CT image reconstructions and MIPs were obtained to evaluate the vascular anatomy. CONTRAST:  138mL OMNIPAQUE IOHEXOL 350 MG/ML SOLN COMPARISON:  Chest CT 01/14/2019 FINDINGS: Cardiovascular: No filling defects within the pulmonary arteries to suggest acute pulmonary embolism. No acute findings of the aorta or great vessels. No pericardial fluid. Mediastinum/Nodes: No axillary supraclavicular adenopathy. No mediastinal hilar adenopathy. No pericardial effusion. Esophagus normal. . Lungs/Pleura: Centrilobular emphysema the upper lobes. Angular nodule in the LEFT upper lobe measuring 1.3 by 0.8 cm compares to 1.4 by 0.9 cm on comparison recent exam remeasured. Lesion was hypermetabolic on comparison PET-CT scan of 10/18/2018. Upper Abdomen: Limited view of the liver, kidneys, pancreas are unremarkable. Normal adrenal glands. Musculoskeletal: No aggressive osseous lesion. Review of the MIP images confirms the above findings. IMPRESSION: 1. No evidence acute pulmonary embolism. 2. Spiculated nodule in the LEFT upper lobe hypermetabolic on comparison PET-CT scan and concerning for bronchogenic carcinoma. 3. Emphysema Electronically Signed   By: Suzy Bouchard M.D.   On: 02/28/2019 17:05   Vas [place] Lower Extremity Venous (dvt) (only Mc & Wl 7a-7p)  Result Date: 03/01/2019  Lower Venous Study Indications: Edema.  Risk Factors: None identified. Limitations: Body habitus and poor ultrasound/tissue interface. Comparison Study: No prior studies. Performing Technologist: [name] Hum RVT  Examination Guidelines: A complete evaluation includes B-mode imaging, spectral Doppler, color Doppler, and power Doppler as needed of all accessible portions of each vessel. Bilateral testing is considered an integral part of a  complete examination. Limited examinations for reoccurring indications may be performed as noted.  +---------+---------------+---------+-----------+----------+--------------+  RIGHT     Compressibility Phasicity Spontaneity Properties Thrombus Aging  +---------+---------------+---------+-----------+----------+--------------+  CFV       Full            Yes       Yes                                    +---------+---------------+---------+-----------+----------+--------------+  SFJ       Full                                                             +---------+---------------+---------+-----------+----------+--------------+  FV Prox                   Yes       Yes                                    +---------+---------------+---------+-----------+----------+--------------+  FV Mid    Full                                                             +---------+---------------+---------+-----------+----------+--------------+  FV Distal Full                                                             +---------+---------------+---------+-----------+----------+--------------+  PFV       Full                                                             +---------+---------------+---------+-----------+----------+--------------+  POP       Full            Yes       Yes                                    +---------+---------------+---------+-----------+----------+--------------+  PTV       Full                                                             +---------+---------------+---------+-----------+----------+--------------+  PERO      Full                                                             +---------+---------------+---------+-----------+----------+--------------+   +----+---------------+---------+-----------+----------+--------------+  LEFT Compressibility Phasicity Spontaneity Properties Thrombus Aging  +----+---------------+---------+-----------+----------+--------------+  CFV  Full            Yes       Yes                                     +----+---------------+---------+-----------+----------+--------------+     Summary: Right: There is no evidence of deep vein thrombosis in the lower extremity. However, portions of this examination were limited- see technologist comments above. No cystic structure found in the popliteal fossa. Left: No evidence of common femoral vein obstruction.  *See table(s) above for measurements and observations. Electronically signed by Monica Martinez MD on 03/01/2019 at 10:34:45 AM.    Final         Scheduled Meds:  atorvastatin  10 mg Oral Daily   dextromethorphan-guaiFENesin  2 tablet Oral BID   enoxaparin (LOVENOX) injection  40 mg Subcutaneous Q24H   mometasone-formoterol  2 puff Inhalation BID   montelukast  10 mg Oral QHS   pantoprazole  40 mg Oral BID AC   predniSONE  40 mg Oral Q breakfast   sertraline  50 mg Oral Daily   sodium chloride flush  3 mL Intravenous Q12H   sodium chloride flush  3 mL Intravenous Q12H   umeclidinium bromide  1 puff Inhalation Daily   Continuous Infusions:  sodium chloride     cefTRIAXone (ROCEPHIN)  IV 2 g (03/01/19 2129)     LOS: 2 days    Time spent: 27 min    Nicolette Bang, MD [facility]  If 7PM-7AM, please contact night-coverage  03/02/2019, 12:44 PM

## 2019-03-03 ENCOUNTER — Telehealth: Payer: Self-pay | Admitting: Pulmonary Disease

## 2019-03-03 MED ORDER — CLINDAMYCIN HCL 300 MG PO CAPS: 300.0000 mg | ORAL_CAPSULE | Freq: Three times a day (TID) | ORAL | 0 refills | Status: AC

## 2019-03-03 MED ORDER — PREDNISONE 10 MG PO TABS: ORAL_TABLET | ORAL | 0 refills | Status: DC

## 2019-03-03 NOTE — Telephone Encounter (Signed)
Spoke with pt's son, Tommi Rumps. States that he and his wife has some concerns about where his mother will be going after she is discharged from the hospital. The pt told him that she would need to come and stay with him after she is discharged but Tommi Rumps states that he does not have the means to do that. Advised Tommi Rumps to speak to a Education officer, museum in the hospital about possibly setting her up in a rehab facility until she can get back to taking care of herself. Cory verbalized understanding. Nothing further was needed.

## 2019-03-03 NOTE — Progress Notes (Signed)
Occupational Therapy Treatment Patient Details Name: Danaka Llera MRN: [id number] DOB: 1950-11-08 Today's Date: 03/03/2019    History of present illness Jamielee Mchale is a 68 y.o. female with medical history significant of COPD, malignant neoplasm upper lobe left lung, hypoxia, anxiety/depression, attempted suicide former smoker quit May 2019 (74 pack year hx).  Presented with increasing shortness of breath productive cough for past 2 days he is on 4 L satting 98% when took off oxygen oxygen saturation dropped to 85% noted right foot swelling she was using naproxen for pain. Found to have COPD with acute exacerbation and RLE cellultis.   OT comments  Pt plans on DC this day.  Pts son will A as needed.    Follow Up Recommendations  Home health OT;Supervision/Assistance - 24 hour    Equipment Recommendations  None recommended by OT    Recommendations for Other Services      Precautions / Restrictions Precautions Precaution Comments: monitor O2 sats (as low as 90% on RA in room with HR up as high at 129 at that point; at rest on 2 liters 95% and HR 105) Restrictions Weight Bearing Restrictions: No       Mobility Bed Mobility Overal bed mobility: Modified Independent             General bed mobility comments: HOB up and used one rail  Transfers Overall transfer level: Needs assistance Equipment used: None Transfers: Sit to/from Stand Sit to Stand: Modified independent (Device/Increase time)              Balance Overall balance assessment: Mild deficits observed, not formally tested                                         ADL either performed or assessed with clinical judgement   ADL Overall ADL's : Needs assistance/impaired     Grooming: Supervision/safety;Standing;Wash/dry hands;Wash/dry face;Oral care               Lower Body Dressing: Supervision/safety;Sit to/from stand   Toilet Transfer: Min guard;Ambulation;Regular Toilet;Grab  bars;Supervision/safety   Toileting- Clothing Manipulation and Hygiene: Supervision/safety;Sit to/from stand         General ADL Comments: Reinterated energy conservation principles.  Pt able to provide solid examples of how to incorporate these into her ADL activity     Vision Patient Visual Report: No change from baseline            Cognition Arousal/Alertness: Awake/alert Behavior During Therapy: WFL for tasks assessed/performed Overall Cognitive Status: Within Functional Limits for tasks assessed                                                     Pertinent Vitals/ Pain       Pain Assessment: No/denies pain  Home Living Family/patient expects to be discharged to:: Private residence Living Arrangements: Alone Available Help at Discharge: Family;Available PRN/intermittently Type of Home: Apartment Home Access: Stairs to enter Entrance Stairs-Number of Steps: 3 Entrance Stairs-Rails: None(traces hand up wall next to stairs) Home Layout: One level     Bathroom Shower/Tub: Tub/shower unit;Curtain   Bathroom Toilet: Standard Bathroom Accessibility: (not sure)   Home Equipment: Grab bars - tub/shower;Shower seat  Prior Functioning/Environment Level of Independence: Independent        Comments: reports no falls in the last 6 months   Frequency  Min 2X/week        Progress Toward Goals  OT Goals(current goals can now be found in the care plan section)  Progress towards OT goals: Progressing toward goals     Plan Discharge plan remains appropriate       AM-PAC OT "6 Clicks" Daily Activity     Outcome Measure   Help from another person eating meals?: None Help from another person taking care of personal grooming?: None Help from another person toileting, which includes using toliet, bedpan, or urinal?: A Little Help from another person bathing (including washing, rinsing, drying)?: A Little Help from another person to  put on and taking off regular upper body clothing?: None Help from another person to put on and taking off regular lower body clothing?: A Little 6 Click Score: 21    End of Session Equipment Utilized During Treatment: Rolling walker  OT Visit Diagnosis: Unsteadiness on feet (R26.81);Muscle weakness (generalized) (M62.81)   Activity Tolerance Patient tolerated treatment well(with O2 on, not so well on RA)   Patient Left in chair;with call bell/phone within reach;with chair alarm set   Nurse Communication (Asked if pt was OK to stay on 2 liters with sats 96% (RN said that was fine), pt needs flutter valve and inspirometer, I will ask PT to do ambulatory O2 qualification note.)        Time: 3419-6222 OT Time Calculation (min): 12 min  Charges: OT General Charges $OT Visit: 1 Visit OT Treatments $Self Care/Home Management : 8-22 mins  Kari Baars, [place] [phone] Office- [phone]      Cliford Sequeira, Edwena Felty D 03/03/2019, 1:08 PM

## 2019-03-03 NOTE — TOC Progression Note (Signed)
Transition of Care [facility]) - Progression Note    Patient Details  Name: Lori Butler MRN: [id number] Date of Birth: 02-09-1951  [facility] CM/SW Contact  Purcell Mouton, RN Phone Number: 03/03/2019, 3:42 PM  Clinical Narrative:    Spoke with pt's son concerning discharge plan and SNF/Rehab. Pt did not qualify for SNF/Rehab. Also, pt asked that her daughter in law [name] be called and her son [name] to explain discharge plan. Explained that [facility] would follow her at home. Also that [facility] offers a Therapist, sports that they may hire to be with pt. Telephone number for [facility] given to son [phone].    Expected Discharge Plan: [place] Barriers to Discharge: No Barriers Identified  Expected Discharge Plan and Services Expected Discharge Plan: [place]   Discharge Planning Services: CM Consult Post Acute Care Choice: Durable Medical Equipment Living arrangements for the past 2 months: Single Family Home Expected Discharge Date: 03/03/19               DME Arranged: Oxygen DME Agency: [employer] Date DME Agency Contacted: 03/03/19 Time DME Agency Contacted: [phone] Representative spoke with at DME Agency: Thedore Mins HH Arranged: PT, OT [place] Agency: [place] Date [place]: 03/03/19 Time [place]: [phone] Representative spoke with at [place]: [place] ([place]) Interventions    Readmission Risk Interventions No flowsheet data found.

## 2019-03-03 NOTE — Discharge Summary (Signed)
Physician Discharge Summary  Lori Butler OJJ:[id number] DOB: [birth year] DOA: 02/28/2019  PCP: [name], MD  Admit date: 02/28/2019 Discharge date: 03/03/2019  Admitted From: Inpatient Disposition: home  Recommendations for Outpatient Follow-up:  1. Follow up with PCP in 1-2 weeks 2. Please obtain BMP/CBC in one week 3. Please follow up on the following pending results:  Home Health:Yes Equipment/Devices:02  Discharge Condition:Stable CODE STATUS:Full code Diet recommendation: Regular healthy diet  Brief/Interim Summary: Patient is a 68 year old female with history of COPD,malignantneoplasm of the left upper lobe, anxiety/depression who presented with increasing shortness of breath, productive cough for last 2 days. She was seen by her PCP in the office and was noted to be tachycardic, hypoxic and was sent to the emergency department. Patient was admitted for the management of COPD exacerbation. PCCM also consulted by the admitter.  Hospital course Acute respiratory failure with hypoxia COPD with acute exacerbation: History of COPD. Follows with pulmonology, Dr. Elsworth Soho. Presented with shortness of breath, productive cough. c/w steroids, bronchodilators, antibiotic. Has inhalers and nebulizer machine at home. CT angio done in the emergency department did not show any pulmonary embolism. pulm was consulted, initials recs recommended continued inpt care 2/2 acute on chronic resp failure with continuation of steroids, nebulizers and evaluation for supplemental O2.  Patient continued to improve and was stable for discharge.  She was checked for oxygen and qualified with walking O2 sats 87% on room air.  DME for supplemental O2 was placed, patient was to be discharged on a prednisone taper with follow-up with pulmonology on November 3.  Cellulitis of right HBZ:JIRCV leg is edematous, erythematous but improving, nl wbc.  Patient is remained afebrile, white count is normal, she  responded appropriately to antibiotics.  She will complete a course of antibiotics as an outpatient.. Venous Doppler was also done and neg  for DVT.  Left upper lobe lung cancer: Recently finished radiation therapy as per Dr. Lisbeth Renshaw.Follows with Dr Lisbeth Renshaw in pulmonology as noted  Debility/deconditioning: Patient improved ambulated without complication in the hallway.  Home health as above  Discharge Diagnoses:  Active Problems:   COPD exacerbation (HCC)   Hypoxia   Anxiety and depression   Malignant neoplasm of upper lobe of left lung (HCC)   COPD with acute exacerbation (HCC)   Cellulitis of right leg    Discharge Instructions  Discharge Instructions    Call MD for:   Complete by: As directed    For any acute change in medical condition   Diet - low sodium heart healthy   Complete by: As directed    Increase activity slowly   Complete by: As directed      Allergies as of 03/03/2019      Reactions   Demerol [meperidine] Other (See Comments)   "horrible reaction"   Oxycodone-acetaminophen Other (See Comments)   Upset stomach    Tramadol Nausea And Vomiting      Medication List    STOP taking these medications   doxycycline 100 MG tablet Commonly known as: VIBRA-TABS     TAKE these medications   albuterol 108 (90 Base) MCG/ACT inhaler Commonly known as: VENTOLIN HFA Inhale 2 puffs into the lungs every 6 (six) hours as needed for wheezing or shortness of breath.   albuterol (2.5 MG/3ML) 0.083% nebulizer solution Commonly known as: PROVENTIL Take 3 mLs (2.5 mg total) by nebulization every 6 (six) hours as needed for wheezing or shortness of breath.   alendronate 70 MG tablet Commonly known as: FOSAMAX Take  70 mg by mouth every Wednesday.   atorvastatin 10 MG tablet Commonly known as: LIPITOR Take 10 mg by mouth daily.   benzonatate 200 MG capsule Commonly known as: TESSALON Take 1 capsule (200 mg total) by mouth 3 (three) times daily as needed for  cough.   budesonide-formoterol 160-4.5 MCG/ACT inhaler Commonly known as: Symbicort Inhale 2 puffs into the lungs 2 (two) times daily.   clindamycin 300 MG capsule Commonly known as: CLEOCIN Take 1 capsule (300 mg total) by mouth 3 (three) times daily for 7 days.   guaiFENesin 600 MG 12 hr tablet Commonly known as: Mucinex Take 2 tablets (1,200 mg total) by mouth 2 (two) times daily. What changed: how much to take   montelukast 10 MG tablet Commonly known as: SINGULAIR Take 1 tablet (10 mg total) by mouth at bedtime.   naproxen 375 MG tablet Commonly known as: NAPROSYN Take 1 tablet (375 mg total) by mouth 2 (two) times daily. What changed:   when to take this  reasons to take this   pantoprazole 40 MG tablet Commonly known as: PROTONIX Take 1 tablet (40 mg total) by mouth 2 (two) times daily. What changed: when to take this   predniSONE 10 MG tablet Commonly known as: DELTASONE 40mg  x 4 days; 30mg  x 4 days; 20mg  x 4 days; then STAY on 10 mg daily until stopped or changed by provider What changed: additional instructions   promethazine-codeine 6.25-10 MG/5ML syrup Commonly known as: PHENERGAN with CODEINE Take 5 mLs by mouth every 6 (six) hours as needed for cough.   sertraline 50 MG tablet Commonly known as: ZOLOFT Take 1 tablet (50 mg total) by mouth daily.   tiotropium 18 MCG inhalation capsule Commonly known as: SPIRIVA Place 18 mcg into inhaler and inhale daily.   tiotropium 18 MCG inhalation capsule Commonly known as: Spiriva HandiHaler Place 1 capsule (18 mcg total) into inhaler and inhale daily.   Vitamin D 50 MCG (2000 UT) tablet Take 2,000 Units by mouth daily.            Durable Medical Equipment  (From admission, onward)         Start     Ordered   03/03/19 1136  For home use only DME oxygen  Once    Question Answer Comment  Length of Need 6 Months   Mode or (Route) Nasal cannula   Liters per Minute 2   Frequency Continuous  (stationary and portable oxygen unit needed)   Oxygen conserving device No   Oxygen delivery system Gas      03/03/19 1136         Follow-up Information    [facility] Follow up on 03/03/2019.   Specialty: [place] Why: [facility] will call you within 48 hours after discharge to make an appointment to come to your home for visits.  Contact information: [address] [place] [address] [phone]        [facility] Follow up.   Why: If there are any problems with your Oxygen please call [facility].  Contact information: [address] [place] [address] [phone]          Allergies  Allergen Reactions  . Demerol [Meperidine] Other (See Comments)    "horrible reaction"  . Oxycodone-Acetaminophen Other (See Comments)    Upset stomach   . Tramadol Nausea And Vomiting    Consultations:  pulm   Procedures/Studies: Dg Chest 2  View  Result Date: 02/19/2019 CLINICAL DATA:  Patient reports to the ED for coughing. Patient last had chemo August 14th. Patient reports she has been coughing so hard that she "thinks she may have cracked a rib" hx copd, lung cancer, ex-smoker EXAM: CHEST - 2 VIEW COMPARISON:  11/11/2018 FINDINGS: Cardiac silhouette is normal in size. No mediastinal or hilar masses. No evidence of adenopathy. Three surgical vascular clips project above and lateral to the left hilum new since the prior radiographs, but stable from a CT dated 01/24/2019. There is opacity extending above the left hilum in the medial left upper lobe that is also unchanged from the prior CT. Lungs are hyperexpanded but otherwise clear. No pleural effusion.  No pneumothorax. Skeletal structures are intact.  No visualized rib fracture. IMPRESSION: 1. No acute cardiopulmonary disease. 2. Left upper lobe opacity medially, above the left hilum, consistent with scarring from treatment of prior lung carcinoma. Active neoplastic disease  is not excluded. 3. Lung hyperexpansion consistent with COPD. Electronically Signed   By: Lajean Manes M.D.   On: 02/19/2019 13:21   Ct Angio Chest Pe W/cm &/or Wo Cm  Result Date: 02/28/2019 CLINICAL DATA:  CONTRAST-100ML OMNI 350 Pt BIB by EMS from PCP office pt has been having increasing SOB , with productive cough past 2 days and is DOE at baseline, Pt reports increasing DOE with position changes. Pt has HX of COPD , Lung Chest pain, complex, intermediate/high prob of ACS/PE/AAS CA pt w R LE swelling and dyspnea w hypoxia w exertion EXAM: CT ANGIOGRAPHY CHEST WITH CONTRAST TECHNIQUE: Multidetector CT imaging of the chest was performed using the standard protocol during bolus administration of intravenous contrast. Multiplanar CT image reconstructions and MIPs were obtained to evaluate the vascular anatomy. CONTRAST:  171mL OMNIPAQUE IOHEXOL 350 MG/ML SOLN COMPARISON:  Chest CT 01/14/2019 FINDINGS: Cardiovascular: No filling defects within the pulmonary arteries to suggest acute pulmonary embolism. No acute findings of the aorta or great vessels. No pericardial fluid. Mediastinum/Nodes: No axillary supraclavicular adenopathy. No mediastinal hilar adenopathy. No pericardial effusion. Esophagus normal. . Lungs/Pleura: Centrilobular emphysema the upper lobes. Angular nodule in the LEFT upper lobe measuring 1.3 by 0.8 cm compares to 1.4 by 0.9 cm on comparison recent exam remeasured. Lesion was hypermetabolic on comparison PET-CT scan of 10/18/2018. Upper Abdomen: Limited view of the liver, kidneys, pancreas are unremarkable. Normal adrenal glands. Musculoskeletal: No aggressive osseous lesion. Review of the MIP images confirms the above findings. IMPRESSION: 1. No evidence acute pulmonary embolism. 2. Spiculated nodule in the LEFT upper lobe hypermetabolic on comparison PET-CT scan and concerning for bronchogenic carcinoma. 3. Emphysema Electronically Signed   By: Suzy Bouchard M.D.   On: 02/28/2019 17:05    Vas [place] Lower Extremity Venous (dvt) (only Mc & Wl 7a-7p)  Result Date: 03/01/2019  Lower Venous Study Indications: Edema.  Risk Factors: None identified. Limitations: Body habitus and poor ultrasound/tissue interface. Comparison Study: No prior studies. Performing Technologist: [name] Hum RVT  Examination Guidelines: A complete evaluation includes B-mode imaging, spectral Doppler, color Doppler, and power Doppler as needed of all accessible portions of each vessel. Bilateral testing is considered an integral part of a complete examination. Limited examinations for reoccurring indications may be performed as noted.  +---------+---------------+---------+-----------+----------+--------------+ RIGHT    CompressibilityPhasicitySpontaneityPropertiesThrombus Aging +---------+---------------+---------+-----------+----------+--------------+ CFV      Full           Yes      Yes                                 +---------+---------------+---------+-----------+----------+--------------+  SFJ      Full                                                        +---------+---------------+---------+-----------+----------+--------------+ FV Prox                 Yes      Yes                                 +---------+---------------+---------+-----------+----------+--------------+ FV Mid   Full                                                        +---------+---------------+---------+-----------+----------+--------------+ FV DistalFull                                                        +---------+---------------+---------+-----------+----------+--------------+ PFV      Full                                                        +---------+---------------+---------+-----------+----------+--------------+ POP      Full           Yes      Yes                                 +---------+---------------+---------+-----------+----------+--------------+ PTV      Full                                                         +---------+---------------+---------+-----------+----------+--------------+ PERO     Full                                                        +---------+---------------+---------+-----------+----------+--------------+   +----+---------------+---------+-----------+----------+--------------+ LEFTCompressibilityPhasicitySpontaneityPropertiesThrombus Aging +----+---------------+---------+-----------+----------+--------------+ CFV Full           Yes      Yes                                 +----+---------------+---------+-----------+----------+--------------+     Summary: Right: There is no evidence of deep vein thrombosis in the lower extremity. However, portions of this examination were limited- see technologist comments above. No cystic structure found in the popliteal fossa. Left: No evidence of common femoral vein obstruction.  *See table(s) above for measurements and observations. Electronically signed by  Monica Martinez MD on 03/01/2019 at 10:34:45 AM.    Final        Subjective: Patient reports she feels significantly better, ambulated in the hallways and did qualify for O2 Felt stable for discharge home today  Discharge Exam: Vitals:   03/03/19 0702 03/03/19 0848  BP: 135/69   Pulse: 72   Resp: 18   Temp: 98.4 F (36.9 C)   SpO2: 97% 99%   Vitals:   03/02/19 2010 03/02/19 2046 03/03/19 0702 03/03/19 0848  BP: 113/62  135/69   Pulse: 74  72   Resp: 18  18   Temp: 98.4 F (36.9 C)  98.4 F (36.9 C)   TempSrc: Oral  Oral   SpO2: 96% 96% 97% 99%  Weight:      Height:        General: Pt is alert, awake, not in acute distress, ambulating in hallway without complication Cardiovascular: RRR, S1/S2 +, no rubs, no gallops Respiratory: CTA bilaterally, no wheezing, trace rhonchi, no accessory muscle use Abdominal: Soft, NT, ND, bowel sounds + Extremities: no edema, no cyanosis    The results of  significant diagnostics from this hospitalization (including imaging, microbiology, ancillary and laboratory) are listed below for reference.     Microbiology: Recent Results (from the past 240 hour(s))  SARS CORONAVIRUS 2 (TAT 6-24 HRS) Nasopharyngeal Nasopharyngeal Swab     Status: None   Collection Time: 02/28/19  6:39 PM   Specimen: Nasopharyngeal Swab  Result Value Ref Range Status   SARS Coronavirus 2 NEGATIVE NEGATIVE Final    Comment: (NOTE) SARS-CoV-2 target nucleic acids are NOT DETECTED. The SARS-CoV-2 RNA is generally detectable in upper and lower respiratory specimens during the acute phase of infection. Negative results do not preclude SARS-CoV-2 infection, do not rule out co-infections with other pathogens, and should not be used as the sole basis for treatment or other patient management decisions. Negative results must be combined with clinical observations, patient history, and epidemiological information. The expected result is Negative. Fact Sheet for Patients: SugarRoll.be Fact Sheet for Healthcare Providers: https://www.woods-mathews.com/ This test is not yet approved or cleared by the [place] FDA and  has been authorized for detection and/or diagnosis of SARS-CoV-2 by FDA under an Emergency Use Authorization (EUA). This EUA will remain  in effect (meaning this test can be used) for the duration of the COVID-19 declaration under Section 56 4(b)(1) of the Act, 21 U.S.C. section 360bbb-3(b)(1), unless the authorization is terminated or revoked sooner. Performed at [facility], [place] [address], [place], [place] [address]   Respiratory Panel by PCR     Status: None   Collection Time: 02/28/19  9:53 PM   Specimen: Nasopharyngeal Swab; Respiratory  Result Value Ref Range Status   Adenovirus NOT DETECTED NOT DETECTED Final   Coronavirus 229E NOT DETECTED NOT DETECTED Final    Comment: (NOTE) The Coronavirus  on the Respiratory Panel, DOES NOT test for the novel  Coronavirus (2019 nCoV)    Coronavirus HKU1 NOT DETECTED NOT DETECTED Final   Coronavirus NL63 NOT DETECTED NOT DETECTED Final   Coronavirus OC43 NOT DETECTED NOT DETECTED Final   Metapneumovirus NOT DETECTED NOT DETECTED Final   Rhinovirus / Enterovirus NOT DETECTED NOT DETECTED Final   Influenza A NOT DETECTED NOT DETECTED Final   Influenza B NOT DETECTED NOT DETECTED Final   Parainfluenza Virus 1 NOT DETECTED NOT DETECTED Final   Parainfluenza Virus 2 NOT DETECTED NOT DETECTED Final   Parainfluenza Virus  3 NOT DETECTED NOT DETECTED Final   Parainfluenza Virus 4 NOT DETECTED NOT DETECTED Final   Respiratory Syncytial Virus NOT DETECTED NOT DETECTED Final   Bordetella pertussis NOT DETECTED NOT DETECTED Final   Chlamydophila pneumoniae NOT DETECTED NOT DETECTED Final   Mycoplasma pneumoniae NOT DETECTED NOT DETECTED Final    Comment: Performed at [facility], [place] [address] [place], [place] [address]  MRSA PCR Screening     Status: None   Collection Time: 02/28/19  9:53 PM   Specimen: Nasopharyngeal Swab  Result Value Ref Range Status   MRSA by PCR NEGATIVE NEGATIVE Final    Comment:        The GeneXpert MRSA Assay (FDA approved for NASAL specimens only), is one component of a comprehensive MRSA colonization surveillance program. It is not intended to diagnose MRSA infection nor to guide or monitor treatment for MRSA infections. Performed at [facility], [place] [address] [place], [place] [address]      Labs: BNP (last 3 results) Recent Labs    02/28/19 2153  BNP 43.3   Basic Metabolic Panel: Recent Labs  Lab 02/28/19 1441 03/01/19 0516  NA 140 139  K 4.1 4.7  CL 108 105  CO2 24 20*  GLUCOSE 83 108*  BUN 14 15  CREATININE 0.80 0.73  CALCIUM 8.5* 8.7*  MG  --  2.1  PHOS  --  4.3   Liver Function Tests: Recent Labs  Lab 02/28/19 1441 03/01/19 0516  AST 33 36  ALT 39 39   ALKPHOS 161* 150*  BILITOT 0.5 0.6  PROT 6.6 7.1  ALBUMIN 3.5 3.8   No results for input(s): LIPASE, AMYLASE in the last 168 hours. No results for input(s): AMMONIA in the last 168 hours. CBC: Recent Labs  Lab 02/28/19 1441 03/01/19 0516  WBC 9.2 7.2  NEUTROABS 6.1  --   HGB 12.4 13.0  HCT 40.3 43.6  MCV 93.1 95.4  PLT 338 301   Cardiac Enzymes: No results for input(s): CKTOTAL, CKMB, CKMBINDEX, TROPONINI in the last 168 hours. BNP: Invalid input(s): POCBNP CBG: No results for input(s): GLUCAP in the last 168 hours. D-Dimer No results for input(s): DDIMER in the last 72 hours. Hgb A1c No results for input(s): HGBA1C in the last 72 hours. Lipid Profile No results for input(s): CHOL, HDL, LDLCALC, TRIG, CHOLHDL, LDLDIRECT in the last 72 hours. Thyroid function studies Recent Labs    03/01/19 0516  TSH 1.165   Anemia work up No results for input(s): VITAMINB12, FOLATE, FERRITIN, TIBC, IRON, RETICCTPCT in the last 72 hours. Urinalysis    Component Value Date/Time   COLORURINE YELLOW 03/01/2019 0112   APPEARANCEUR CLEAR 03/01/2019 0112   LABSPEC 1.020 03/01/2019 0112   PHURINE 6.0 03/01/2019 0112   GLUCOSEU NEGATIVE 03/01/2019 0112   HGBUR NEGATIVE 03/01/2019 0112   BILIRUBINUR NEGATIVE 03/01/2019 0112   KETONESUR 20 (A) 03/01/2019 0112   PROTEINUR NEGATIVE 03/01/2019 0112   UROBILINOGEN 1.0 07/12/2012 1842   NITRITE NEGATIVE 03/01/2019 0112   LEUKOCYTESUR NEGATIVE 03/01/2019 0112   Sepsis Labs Invalid input(s): PROCALCITONIN,  WBC,  LACTICIDVEN Microbiology Recent Results (from the past 240 hour(s))  SARS CORONAVIRUS 2 (TAT 6-24 HRS) Nasopharyngeal Nasopharyngeal Swab     Status: None   Collection Time: 02/28/19  6:39 PM   Specimen: Nasopharyngeal Swab  Result Value Ref Range Status   SARS Coronavirus 2 NEGATIVE NEGATIVE Final    Comment: (NOTE) SARS-CoV-2 target nucleic acids are NOT DETECTED.  The SARS-CoV-2 RNA is generally detectable in upper and  lower respiratory specimens during the acute phase of infection. Negative results do not preclude SARS-CoV-2 infection, do not rule out co-infections with other pathogens, and should not be used as the sole basis for treatment or other patient management decisions. Negative results must be combined with clinical observations, patient history, and epidemiological information. The expected result is Negative. Fact Sheet for Patients: SugarRoll.be Fact Sheet for Healthcare Providers: https://www.woods-mathews.com/ This test is not yet approved or cleared by the [place] FDA and  has been authorized for detection and/or diagnosis of SARS-CoV-2 by FDA under an Emergency Use Authorization (EUA). This EUA will remain  in effect (meaning this test can be used) for the duration of the COVID-19 declaration under Section 56 4(b)(1) of the Act, 21 U.S.C. section 360bbb-3(b)(1), unless the authorization is terminated or revoked sooner. Performed at [facility], [place] [address] [place], [place] [address]   Respiratory Panel by PCR     Status: None   Collection Time: 02/28/19  9:53 PM   Specimen: Nasopharyngeal Swab; Respiratory  Result Value Ref Range Status   Adenovirus NOT DETECTED NOT DETECTED Final   Coronavirus 229E NOT DETECTED NOT DETECTED Final    Comment: (NOTE) The Coronavirus on the Respiratory Panel, DOES NOT test for the novel  Coronavirus (2019 nCoV)    Coronavirus HKU1 NOT DETECTED NOT DETECTED Final   Coronavirus NL63 NOT DETECTED NOT DETECTED Final   Coronavirus OC43 NOT DETECTED NOT DETECTED Final   Metapneumovirus NOT DETECTED NOT DETECTED Final   Rhinovirus / Enterovirus NOT DETECTED NOT DETECTED Final   Influenza A NOT DETECTED NOT DETECTED Final   Influenza B NOT DETECTED NOT DETECTED Final   Parainfluenza Virus 1 NOT DETECTED NOT DETECTED Final   Parainfluenza Virus 2 NOT DETECTED NOT DETECTED Final   Parainfluenza  Virus 3 NOT DETECTED NOT DETECTED Final   Parainfluenza Virus 4 NOT DETECTED NOT DETECTED Final   Respiratory Syncytial Virus NOT DETECTED NOT DETECTED Final   Bordetella pertussis NOT DETECTED NOT DETECTED Final   Chlamydophila pneumoniae NOT DETECTED NOT DETECTED Final   Mycoplasma pneumoniae NOT DETECTED NOT DETECTED Final    Comment: Performed at [facility], [place]. [address], [place], [place] [address]  MRSA PCR Screening     Status: None   Collection Time: 02/28/19  9:53 PM   Specimen: Nasopharyngeal Swab  Result Value Ref Range Status   MRSA by PCR NEGATIVE NEGATIVE Final    Comment:        The GeneXpert MRSA Assay (FDA approved for NASAL specimens only), is one component of a comprehensive MRSA colonization surveillance program. It is not intended to diagnose MRSA infection nor to guide or monitor treatment for MRSA infections. Performed at [facility], [place] [address] [place], [place] [address]      Time coordinating discharge: Over 30 minutes  SIGNED:   Nicolette Bang, MD  [facility] 03/03/2019, 11:40 AM Pager   If 7PM-7AM, please contact night-coverage [email or web address] Password [email or web address]

## 2019-03-03 NOTE — TOC Initial Note (Signed)
Transition of Care [facility]) - Initial/Assessment Note    Patient Details  Name: Lori Butler MRN: [id number] Date of Birth: 1950-09-28  Transition of Care [facility]) CM/SW Contact:    Purcell Mouton, RN Phone Number: 03/03/2019, 11:16 AM  Clinical Narrative:                 68 yo pt plan to discharge home with [facility] and O2 from [employer]. Referrals called to each Agency.   Expected Discharge Plan: [place] Barriers to Discharge: No Barriers Identified   Patient Goals and CMS Choice Patient states their goals for this hospitalization and ongoing recovery are:: To get better and go home.   Choice offered to / list presented to : Patient  Expected Discharge Plan and Services Expected Discharge Plan: [place]   Discharge Planning Services: CM Consult Post Acute Care Choice: Durable Medical Equipment Living arrangements for the past 2 months: Single Family Home                 DME Arranged: Oxygen DME Agency: [employer] Date DME Agency Contacted: 03/03/19 Time DME Agency Contacted: [id number] Representative spoke with at DME Agency: Thedore Mins HH Arranged: PT, OT [place] Agency: [place] Date [place]: 03/03/19 Time [place]: [id number] Representative spoke with at [place]: Tommi Rumps  Prior Living Arrangements/Services Living arrangements for the past 2 months: [place] Lives with:: Spouse Patient language and need for interpreter reviewed:: No          Care giver support system in place?: Yes (comment)      Activities of Daily Living Home Assistive Devices/Equipment: None ADL Screening (condition at time of admission) Patient's cognitive ability adequate to safely complete daily activities?: Yes Is the patient deaf or have difficulty hearing?: No Does the patient have difficulty seeing, even when wearing glasses/contacts?: No Does the patient have difficulty concentrating, remembering, or making  decisions?: No Patient able to express need for assistance with ADLs?: Yes Does the patient have difficulty dressing or bathing?: No Independently performs ADLs?: Yes (appropriate for developmental age) Does the patient have difficulty walking or climbing stairs?: Yes(SOB) Weakness of Legs: None Weakness of Arms/Hands: None  Permission Sought/Granted Permission sought to share information with : Case Manager                Emotional Assessment Appearance:: Appears stated age     Orientation: : Oriented to Self, Oriented to Place, Oriented to  Time, Oriented to Situation      Admission diagnosis:  COPD with acute exacerbation ([place]) [J44.1] Patient Active Problem List   Diagnosis Date Noted  . COPD with acute exacerbation ([place]) 02/28/2019  . Cellulitis of right leg 02/28/2019  . Malignant neoplasm of upper lobe of left lung ([employer]) 11/14/2018  . Solitary pulmonary nodule on lung CT 10/22/2018  . Acute exacerbation of chronic obstructive pulmonary disease ([place]) 05/05/2018  . Anxiety and depression 05/05/2018  . Thrush, oral 04/15/2018  . Major depressive disorder, recurrent severe without psychotic features ([place]) 02/24/2018  . Attempted suicide ([place]) 02/21/2018  . Hypoxia   . Physical deconditioning   . COPD exacerbation ([place])   . Vitamin B12 deficiency 11/10/2017  . Osteoporosis 11/10/2017  . Vitamin D deficiency 11/10/2017  . Tobacco abuse 09/20/2017  . COPD (chronic obstructive pulmonary disease) ([place]) 09/15/2017   PCP:  [name], MD Pharmacy:   [facility]  [place] [address] Phone: [phone] Fax: [place], [place] [place] [address] Phone: [phone] Fax: [phone]     Social Determinants of Health (SDOH) Interventions    Readmission Risk Interventions No flowsheet data found.

## 2019-03-03 NOTE — Progress Notes (Signed)
SATURATION QUALIFICATIONS: (This note is used to comply with regulatory documentation for home oxygen)  Patient Saturations on Room Air at Rest = 92%  Patient Saturations on Room Air while Ambulating = 87%  Patient Saturations on 2 Liters of oxygen while Ambulating = 95%  Please briefly explain why patient needs home oxygen:

## 2019-03-03 NOTE — Progress Notes (Addendum)
PULMONARY / CRITICAL CARE MEDICINE   NAME:  Lori Butler, MRN:  [id number], DOB:  [birth year], LOS: 3 ADMISSION DATE:  02/28/2019, CONSULTATION DATE:  03/01/2019 REFERRING MD:  [place], CHIEF COMPLAINT:  Sob and cough   BRIEF HISTORY:    68 yowf quit smoking 17 months PTA with COPD  GOLD III with  Significant AB component not 02 dep with empirical  SBRT 12/03/18 -12/10/2018 and on rx for possible RT fibrosis with prednisone tapered off as of last televist 02/06/19 but gradually worse since then  doe/cough and intermittent L CP   > to er 10/23 and admitted with neg CTa and dx of aecopd and pccm service asked to see am 03/01/2019.      SIGNIFICANT EVENTS:    STUDIES:    CTa 02/28/2019>> 1. No evidence acute pulmonary embolism. 2. Spiculated nodule in the LEFT upper lobe hypermetabolic on comparison PET-CT scan and concerning for bronchogenic carcinoma. 3. Emphysema s evidence of RT or any other form of pneumonitis  CULTURES:  Resp viral panel 02/28/2019 neg  COVID 19 PCR 02/28/2019 neg MRSC PCR  02/28/2019 neg  ANTIBIOTICS:  Rocephin 02/28/19 Clindamycine 02/28/2019    CONSULTANTS:  PCCM 03/01/2019  SUBJECTIVE:   Feeling much improved No dyspnea, no wheezing  CONSTITUTIONAL: BP 135/69   Pulse 72   Temp 98.4 F (36.9 C) (Oral)   Resp 18   Ht 5\' 5"  (1.651 m)   Wt 82.5 kg   SpO2 99%   BMI 30.27 kg/m   2lpm NP  I/O last 3 completed shifts: In: 33 [P.O.:600; IV Piggyback:200] Out: 700 [Urine:700]        PHYSICAL EXAM:  Elderly woman upright position in bed, no distress  No jvd Oropharynx clear,  mucosa nl, mild pallor, no icterus Neck supple Lungs decreased breath sounds bilateral, no rhonchi, no accessory muscle use RRR no s3 or or sign murmur Abd soft, nl excursion  Extr warm no rash        RESOLVED PROBLEM LIST   ASSESSMENT AND PLAN   1) COPD exac due to discontinuation of oral prednisone in setting of very poor hfa technique.  -  No evidence of RT  pneumonitis though this was previous concern -Discharged on 40 mg of prednisone, taper by 10 mg every 4 days until she is down to 10 mg and maintain at this level until her appointment in November  -Continue Spiriva and Symbicort which would be home meds on discharge She does have explosive onset of symptoms felt that she has asthma component     2) intermittent L CW pain  - suspect this is MSCP from coughing  - improved since admit    3) new Hypoxemic Resp failure > assess for oxygen needs on ambulation on room air She is amenable to using portable oxygen if needed  4) left upper lobe nodule -status post empiric RT in August  PCCM available as needed, she has follow-up appointment for November 3  [name] MD. [facility]. [place] [facility]  If no response to pager , please call [phone]   03/03/2019

## 2019-03-04 LAB — ALPHA-1 ANTITRYPSIN PHENOTYPE: A-1 Antitrypsin, Ser: 196 mg/dL — ABNORMAL HIGH (ref 101–187)

## 2019-03-05 DIAGNOSIS — E559 Vitamin D deficiency, unspecified: Secondary | ICD-10-CM | POA: Diagnosis not present

## 2019-03-05 DIAGNOSIS — J9621 Acute and chronic respiratory failure with hypoxia: Secondary | ICD-10-CM | POA: Diagnosis not present

## 2019-03-05 DIAGNOSIS — L03115 Cellulitis of right lower limb: Secondary | ICD-10-CM | POA: Diagnosis not present

## 2019-03-05 DIAGNOSIS — C3412 Malignant neoplasm of upper lobe, left bronchus or lung: Secondary | ICD-10-CM | POA: Diagnosis not present

## 2019-03-05 DIAGNOSIS — M810 Age-related osteoporosis without current pathological fracture: Secondary | ICD-10-CM | POA: Diagnosis not present

## 2019-03-05 DIAGNOSIS — J439 Emphysema, unspecified: Secondary | ICD-10-CM | POA: Diagnosis not present

## 2019-03-05 DIAGNOSIS — I700 Atherosclerosis of aorta: Secondary | ICD-10-CM | POA: Diagnosis not present

## 2019-03-05 DIAGNOSIS — F332 Major depressive disorder, recurrent severe without psychotic features: Secondary | ICD-10-CM | POA: Diagnosis not present

## 2019-03-05 DIAGNOSIS — F419 Anxiety disorder, unspecified: Secondary | ICD-10-CM | POA: Diagnosis not present

## 2019-03-06 ENCOUNTER — Other Ambulatory Visit: Payer: Self-pay

## 2019-03-06 NOTE — Patient Outreach (Signed)
[facility]) Care Management  03/06/2019   Ortha Metts [place] 1951/04/27 [id number]  Initial Assessment   EMMI-GENERAL DISCHARGE RED ON EMMI ALERT Day # 1 Date: 03/05/2019 Red Alert Reason: "Other questions/problems? Yes"   Outreach attempt #1 to patient. Spoke with patient who denies any acute issues or concerns. Reviewed and addressed red alert with patient. She reports that he had a question about her oxygen tubing but HHPT was able to answer the question on yesterday during home visit. She voices that her nose was getting sore from oxygen but she was told she had nasal prongs the wrong way by HHPT. RN CM discussed with pt. That she may benefit from humidifier to concentrator to prevent nose dryness and possible nosebleeds. She will follow up with [place].Patient lives alone but voices she is independent with ADLs/IADLs.   Conditions: Per chart review, patient has PMH that includes but not limited to COPD, anxiety, depression, lung CA s/p radiation treatments. Patient hospitalized from 02/28/2019-03/03/2019 for COPD exacerbation(PCP Office does TOC). Patient reports that her last treatments were in August. She goes back in March for CT scan. She has oxygen in the home and is using it as needed.    Appointments: Patient has not yet made PCP appt. She is awaiting on delivery of portable oxygen. She follows up with lung MD in Dec.    Medications: Med review completed with pt. No issues noted. She denies any issues affording and/or managing meds at this time.  Current Medications:  Current Outpatient Medications  Medication Sig Dispense Refill  . albuterol (PROVENTIL HFA;VENTOLIN HFA) 108 (90 Base) MCG/ACT inhaler Inhale 2 puffs into the lungs every 6 (six) hours as needed for wheezing or shortness of breath. 1 Inhaler 2  . albuterol (PROVENTIL) (2.5 MG/3ML) 0.083% nebulizer solution Take 3 mLs (2.5 mg total) by nebulization every 6 (six) hours as needed for wheezing or  shortness of breath. 75 mL 6  . alendronate (FOSAMAX) 70 MG tablet Take 70 mg by mouth every Wednesday.     Marland Kitchen atorvastatin (LIPITOR) 10 MG tablet Take 10 mg by mouth daily.    . benzonatate (TESSALON) 200 MG capsule Take 1 capsule (200 mg total) by mouth 3 (three) times daily as needed for cough. 30 capsule 1  . budesonide-formoterol (SYMBICORT) 160-4.5 MCG/ACT inhaler Inhale 2 puffs into the lungs 2 (two) times daily. 1 Inhaler 0  . Cholecalciferol (VITAMIN D) 50 MCG (2000 UT) tablet Take 2,000 Units by mouth daily.    . clindamycin (CLEOCIN) 300 MG capsule Take 1 capsule (300 mg total) by mouth 3 (three) times daily for 7 days. 30 capsule 0  . guaiFENesin (MUCINEX) 600 MG 12 hr tablet Take 2 tablets (1,200 mg total) by mouth 2 (two) times daily. (Patient taking differently: Take 600 mg by mouth 2 (two) times daily. ) 30 tablet 0  . montelukast (SINGULAIR) 10 MG tablet Take 1 tablet (10 mg total) by mouth at bedtime. 30 tablet 1  . naproxen (NAPROSYN) 375 MG tablet Take 1 tablet (375 mg total) by mouth 2 (two) times daily. (Patient taking differently: Take 375 mg by mouth 2 (two) times daily as needed for moderate pain. ) 20 tablet 0  . pantoprazole (PROTONIX) 40 MG tablet Take 1 tablet (40 mg total) by mouth 2 (two) times daily. (Patient taking differently: Take 40 mg by mouth daily. ) 60 tablet 1  . predniSONE (DELTASONE) 10 MG tablet 40mg  x 4 days; 30mg  x 4 days; 20mg  x 4  days; then STAY on 10 mg daily until stopped or changed by provider 60 tablet 0  . sertraline (ZOLOFT) 50 MG tablet Take 1 tablet (50 mg total) by mouth daily. 30 tablet 0  . tiotropium (SPIRIVA) 18 MCG inhalation capsule Place 18 mcg into inhaler and inhale daily.    . promethazine-codeine (PHENERGAN WITH CODEINE) 6.25-10 MG/5ML syrup Take 5 mLs by mouth every 6 (six) hours as needed for cough. (Patient not taking: Reported on 02/28/2019) 240 mL 0  . tiotropium (SPIRIVA HANDIHALER) 18 MCG inhalation capsule Place 1 capsule (18  mcg total) into inhaler and inhale daily. 30 capsule 11   No current facility-administered medications for this visit.     Functional Status:  In your present state of health, do you have any difficulty performing the following activities: 03/06/2019 03/01/2019  Hearing? N N  Vision? N N  Difficulty concentrating or making decisions? N N  Walking or climbing stairs? N Y  Comment - SOB  Dressing or bathing? N N  Doing errands, shopping? N N  Preparing Food and eating ? N -  Using the Toilet? N -  In the past six months, have you accidently leaked urine? N -  Do you have problems with loss of bowel control? N -  Managing your Medications? N -  Managing your Finances? N -  Housekeeping or managing your Housekeeping? N -  Some recent data might be hidden    Fall/Depression Screening: Fall Risk  03/06/2019 08/15/2018 05/14/2018  Falls in the past year? 0 0 0  Number falls in past yr: 0 - -  Injury with Fall? 0 - -  Risk for fall due to : Medication side effect - -  Follow up Falls evaluation completed - -   PHQ 2/9 Scores 03/06/2019 05/14/2018 03/01/2018 01/22/2018  PHQ - 2 Score 0 1 1 1     Assessment:  THN CM Care Plan Problem One     Most Recent Value  Care Plan Problem One  Knowledge deficit related to disease mgmt and sx mgmt of COPD  Role Documenting the Problem One  Care Management Telephonic Coordinator  Care Plan for Problem One  Active  THN Long Term Goal   Patient will have no COPD related readmission over the next 31 days.  THN Long Term Goal Start Date  03/06/19  Interventions for Problem One Long Term Goal  RNCM assessed for any acute sxs. RN CM discussed COPD action plan with pt.   THN CM Short Term Goal #1   patient will complete all MD follow up appts over the next 30 days.  THN CM Short Term Goal #1 Start Date  03/06/19  Interventions for Short Term Goal #1  RN CM assessed for appt schedule and any barriers to getting to appts.  THN CM Short Term Goal #2   Patient  will report resolution of resp infection over the next 30 days.  THN CM Short Term Goal #2 Start Date  03/06/19  Interventions for Short Term Goal #2  RN CM completed med review with pt and confirmed all meds in the home. RN CM educated pt on importance of completing abx therapy.      Consent: [facility] services reviewed and discussed with pt. Verbal consent for services given.  Plan: RN CM will follow up with patient within a month. RN CM will send welcome letter to patient. RN CM will send barriers letter and route encounter to PCP.  [name], RN,BSN,CCM [place]  Management Telephonic Care Management Coordinator Direct Phone: [phone] Toll Free: [phone] Fax: [phone]

## 2019-03-07 DIAGNOSIS — I700 Atherosclerosis of aorta: Secondary | ICD-10-CM | POA: Diagnosis not present

## 2019-03-07 DIAGNOSIS — M810 Age-related osteoporosis without current pathological fracture: Secondary | ICD-10-CM | POA: Diagnosis not present

## 2019-03-07 DIAGNOSIS — C3412 Malignant neoplasm of upper lobe, left bronchus or lung: Secondary | ICD-10-CM | POA: Diagnosis not present

## 2019-03-07 DIAGNOSIS — J9621 Acute and chronic respiratory failure with hypoxia: Secondary | ICD-10-CM | POA: Diagnosis not present

## 2019-03-07 DIAGNOSIS — J439 Emphysema, unspecified: Secondary | ICD-10-CM | POA: Diagnosis not present

## 2019-03-07 DIAGNOSIS — E559 Vitamin D deficiency, unspecified: Secondary | ICD-10-CM | POA: Diagnosis not present

## 2019-03-07 DIAGNOSIS — F419 Anxiety disorder, unspecified: Secondary | ICD-10-CM | POA: Diagnosis not present

## 2019-03-07 DIAGNOSIS — F332 Major depressive disorder, recurrent severe without psychotic features: Secondary | ICD-10-CM | POA: Diagnosis not present

## 2019-03-07 DIAGNOSIS — L03115 Cellulitis of right lower limb: Secondary | ICD-10-CM | POA: Diagnosis not present

## 2019-03-10 DIAGNOSIS — C3412 Malignant neoplasm of upper lobe, left bronchus or lung: Secondary | ICD-10-CM | POA: Diagnosis not present

## 2019-03-10 DIAGNOSIS — I700 Atherosclerosis of aorta: Secondary | ICD-10-CM | POA: Diagnosis not present

## 2019-03-10 DIAGNOSIS — J439 Emphysema, unspecified: Secondary | ICD-10-CM | POA: Diagnosis not present

## 2019-03-10 DIAGNOSIS — J9621 Acute and chronic respiratory failure with hypoxia: Secondary | ICD-10-CM | POA: Diagnosis not present

## 2019-03-10 DIAGNOSIS — M810 Age-related osteoporosis without current pathological fracture: Secondary | ICD-10-CM | POA: Diagnosis not present

## 2019-03-10 DIAGNOSIS — F332 Major depressive disorder, recurrent severe without psychotic features: Secondary | ICD-10-CM | POA: Diagnosis not present

## 2019-03-10 DIAGNOSIS — F419 Anxiety disorder, unspecified: Secondary | ICD-10-CM | POA: Diagnosis not present

## 2019-03-10 DIAGNOSIS — E559 Vitamin D deficiency, unspecified: Secondary | ICD-10-CM | POA: Diagnosis not present

## 2019-03-10 DIAGNOSIS — L03115 Cellulitis of right lower limb: Secondary | ICD-10-CM | POA: Diagnosis not present

## 2019-03-11 DIAGNOSIS — C3412 Malignant neoplasm of upper lobe, left bronchus or lung: Secondary | ICD-10-CM | POA: Diagnosis not present

## 2019-03-11 DIAGNOSIS — J449 Chronic obstructive pulmonary disease, unspecified: Secondary | ICD-10-CM | POA: Diagnosis not present

## 2019-03-12 DIAGNOSIS — E559 Vitamin D deficiency, unspecified: Secondary | ICD-10-CM | POA: Diagnosis not present

## 2019-03-12 DIAGNOSIS — C3412 Malignant neoplasm of upper lobe, left bronchus or lung: Secondary | ICD-10-CM | POA: Diagnosis not present

## 2019-03-12 DIAGNOSIS — J9621 Acute and chronic respiratory failure with hypoxia: Secondary | ICD-10-CM | POA: Diagnosis not present

## 2019-03-12 DIAGNOSIS — L03115 Cellulitis of right lower limb: Secondary | ICD-10-CM | POA: Diagnosis not present

## 2019-03-12 DIAGNOSIS — F332 Major depressive disorder, recurrent severe without psychotic features: Secondary | ICD-10-CM | POA: Diagnosis not present

## 2019-03-12 DIAGNOSIS — J439 Emphysema, unspecified: Secondary | ICD-10-CM | POA: Diagnosis not present

## 2019-03-12 DIAGNOSIS — F419 Anxiety disorder, unspecified: Secondary | ICD-10-CM | POA: Diagnosis not present

## 2019-03-12 DIAGNOSIS — I700 Atherosclerosis of aorta: Secondary | ICD-10-CM | POA: Diagnosis not present

## 2019-03-12 DIAGNOSIS — M810 Age-related osteoporosis without current pathological fracture: Secondary | ICD-10-CM | POA: Diagnosis not present

## 2019-03-13 ENCOUNTER — Other Ambulatory Visit: Payer: Medicare HMO

## 2019-03-17 DIAGNOSIS — E559 Vitamin D deficiency, unspecified: Secondary | ICD-10-CM | POA: Diagnosis not present

## 2019-03-17 DIAGNOSIS — L03115 Cellulitis of right lower limb: Secondary | ICD-10-CM | POA: Diagnosis not present

## 2019-03-17 DIAGNOSIS — J439 Emphysema, unspecified: Secondary | ICD-10-CM | POA: Diagnosis not present

## 2019-03-17 DIAGNOSIS — M810 Age-related osteoporosis without current pathological fracture: Secondary | ICD-10-CM | POA: Diagnosis not present

## 2019-03-17 DIAGNOSIS — C3412 Malignant neoplasm of upper lobe, left bronchus or lung: Secondary | ICD-10-CM | POA: Diagnosis not present

## 2019-03-17 DIAGNOSIS — F332 Major depressive disorder, recurrent severe without psychotic features: Secondary | ICD-10-CM | POA: Diagnosis not present

## 2019-03-17 DIAGNOSIS — I700 Atherosclerosis of aorta: Secondary | ICD-10-CM | POA: Diagnosis not present

## 2019-03-17 DIAGNOSIS — J9621 Acute and chronic respiratory failure with hypoxia: Secondary | ICD-10-CM | POA: Diagnosis not present

## 2019-03-17 DIAGNOSIS — F419 Anxiety disorder, unspecified: Secondary | ICD-10-CM | POA: Diagnosis not present

## 2019-03-18 DIAGNOSIS — L03115 Cellulitis of right lower limb: Secondary | ICD-10-CM | POA: Diagnosis not present

## 2019-03-18 DIAGNOSIS — M810 Age-related osteoporosis without current pathological fracture: Secondary | ICD-10-CM | POA: Diagnosis not present

## 2019-03-18 DIAGNOSIS — F332 Major depressive disorder, recurrent severe without psychotic features: Secondary | ICD-10-CM | POA: Diagnosis not present

## 2019-03-18 DIAGNOSIS — J439 Emphysema, unspecified: Secondary | ICD-10-CM | POA: Diagnosis not present

## 2019-03-18 DIAGNOSIS — F419 Anxiety disorder, unspecified: Secondary | ICD-10-CM | POA: Diagnosis not present

## 2019-03-18 DIAGNOSIS — C3412 Malignant neoplasm of upper lobe, left bronchus or lung: Secondary | ICD-10-CM | POA: Diagnosis not present

## 2019-03-18 DIAGNOSIS — I700 Atherosclerosis of aorta: Secondary | ICD-10-CM | POA: Diagnosis not present

## 2019-03-18 DIAGNOSIS — E559 Vitamin D deficiency, unspecified: Secondary | ICD-10-CM | POA: Diagnosis not present

## 2019-03-18 DIAGNOSIS — J9621 Acute and chronic respiratory failure with hypoxia: Secondary | ICD-10-CM | POA: Diagnosis not present

## 2019-03-18 NOTE — Telephone Encounter (Signed)
Called and spoke with patient. She has been scheduled for a hospital follow up with [facility] on 11/12.

## 2019-03-19 DIAGNOSIS — I700 Atherosclerosis of aorta: Secondary | ICD-10-CM | POA: Diagnosis not present

## 2019-03-19 DIAGNOSIS — J439 Emphysema, unspecified: Secondary | ICD-10-CM | POA: Diagnosis not present

## 2019-03-19 DIAGNOSIS — L03115 Cellulitis of right lower limb: Secondary | ICD-10-CM | POA: Diagnosis not present

## 2019-03-19 DIAGNOSIS — F332 Major depressive disorder, recurrent severe without psychotic features: Secondary | ICD-10-CM | POA: Diagnosis not present

## 2019-03-19 DIAGNOSIS — M810 Age-related osteoporosis without current pathological fracture: Secondary | ICD-10-CM | POA: Diagnosis not present

## 2019-03-19 DIAGNOSIS — E559 Vitamin D deficiency, unspecified: Secondary | ICD-10-CM | POA: Diagnosis not present

## 2019-03-19 DIAGNOSIS — F419 Anxiety disorder, unspecified: Secondary | ICD-10-CM | POA: Diagnosis not present

## 2019-03-19 DIAGNOSIS — J9621 Acute and chronic respiratory failure with hypoxia: Secondary | ICD-10-CM | POA: Diagnosis not present

## 2019-03-19 DIAGNOSIS — C3412 Malignant neoplasm of upper lobe, left bronchus or lung: Secondary | ICD-10-CM | POA: Diagnosis not present

## 2019-03-20 ENCOUNTER — Encounter: Payer: Self-pay | Admitting: Primary Care

## 2019-03-20 ENCOUNTER — Ambulatory Visit (INDEPENDENT_AMBULATORY_CARE_PROVIDER_SITE_OTHER): Payer: Medicare HMO | Admitting: Primary Care

## 2019-03-20 ENCOUNTER — Other Ambulatory Visit: Payer: Self-pay

## 2019-03-20 DIAGNOSIS — J449 Chronic obstructive pulmonary disease, unspecified: Secondary | ICD-10-CM | POA: Diagnosis not present

## 2019-03-20 MED ORDER — TRELEGY ELLIPTA 200-62.5-25 MCG/INH IN AEPB: 1.0000 | INHALATION_SPRAY | Freq: Every day | RESPIRATORY_TRACT | 0 refills | Status: DC

## 2019-03-20 NOTE — Assessment & Plan Note (Addendum)
-   COPD with asthma overlap - Eos 500 in October  - Associated allergic rhinitis and +BD response on PFT's - Oral steriod dependent  - Trial Trelegy 200 - FU scheduled for Dec 3rd with Dr. Elsworth Soho - Consideration for either adding Daliresp to reduce exacerbations or possible biologic such as Xolair

## 2019-03-20 NOTE — Patient Instructions (Addendum)
Recommendations: Trial Trelegy 200- take 1 puff DAILY (this replaces Symbicort and Spiriva) Use ALBUTEROL hfa/nebulizer every 6 hours for breakthrough shortness of breath/wheezing  Continue wearing 2L oxygen on exertion   RX: Extend prednisone taper (20mg  x 5 days; 10mg  x 5 days)  Follow-up: 2-4 week follow-up with Dr. Elsworth Soho or Eustaquio Maize, NP (Discuss adding Daliresp or starting a biologic such as Xolair for asthma)

## 2019-03-20 NOTE — Progress Notes (Signed)
'@Lori Butler'  ID: Lori Butler, female    DOB: 1950-05-17, 68 y.o.   MRN: [id number]  Chief Complaint  Lori Butler presents with   Hospitalization Follow-up    COPD exacerbation    Referring provider: [name], MD  HPI: 68 year old female, former smoker quit May 2019 (74 pack year hx). PMH significant for COPD, malignant neoplasm upper lobe left lung, hypoxia, anxiety/depression, attempted suicide, tobacco abuse. Lori Butler of Lori Butler. Eos absolute in August 2019 700. Sudden onset and variability seem to suggest component of asthma. Maintained on Symbicort 160, Spiriva and Singulair.   Previous [facility] encounter: 09/16/2018- Televisit, 2 month fu- [name] Lori Butler called today for 2 month follow-up visit. Complains of shortness of breath over the past couple of days. Associated cough with thick white mucus, dark in the am and then clears. She does get relief from oral steroid course, feels better for a about a week or two. No fever, sick contacts or flu-like symptoms. Advised to stay on prednisone 24m daily x 6 weeks until follow-up. Referred to lung cancer screening clinic.   10/22/2018- Televisit, PET scan results - Dr. AElsworth Butler yoex-smokerfor FU of moderate COPD/asthma Sudden onset & variability suggesting a component of asthma She quit smoking 09/2017 She has been maintained on 10 mg of prednisone since she had a phone visit 09/16/2018 for COPD exacerbation.  She has done well since then on a breathing standpoint.  She is maintained on Spiriva and Symbicort with which she is compliant.  She takes albuterol nebs 2-3 times a day when she remembers to take this. She was referred to lung cancer screening and underwent CT chest 10/14/2018 which I reviewed with her in detail-she had bilateral upper lobe parenchymal scarring on her prior CT and left upper lobe scar that appeared more nodular on the CT measuring about 13 mm PET scan showed hypermetabolism with SUV 8 in this nodule She  underwent PFTs which showed moderate reversible airway obstruction with FEV1 of 37%-0.91 which improved to 47%-1.1L DLCO was about 54%  11/07/2018-Lori Butler Lori Butler presents today for 6 week follow-up office visit. Since last televisit with myself Lori Butler met with lung cancer screening program and LDCT showed lung RADS 4x. PET scan positive LUL hypermetabolic spiculated pulmonary nodules consistent with primary bronchogenic carcinoma. Referred to CT surgery for second opinion. Seen by Dr. HRoxan Hockeyon 10/29/18. 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 Potential treatment options were discussed. Lori Butler is not interested in surgery and is strongly interested in radiation therapy. Plan navigation bronchoscopy for bx and fiducial placement on Monday 11/11/18. She is doing well today, breathing has improved since starting prednisone 178mdaily. Plan to taper 38m138m 4 weeks and then stop. Continues Symbicort twice daily and Spriva once daily. Using nebulizer twice daily. Morning cough with clear mucus.   11/26/18- Video visit, Lori Butler Treated for COPD exacerbation with prednisone taper. Advised mucinex DM twice daily. Continue Symbicort and Spiriva. Albuterol neb every 6 hours prn.   12/16/2018  Lori Butler presents today for 1 month follow-up. She had nav bronchoscopy with Dr. HenMichaelene Butler 7/6 with fiducial placement. Pathology and cytology were non-diagnostic with atypical cells no malignant cells. Case discussed at [facility] Lori Butler could continue observation versus stereotactic therapy. Lori Butler did not wish to wait and wanted to proceed with radiation therapy despite definitive carcinoma. She began SBRT 12/03/18 and has had three treatments total. Last treatment 8/4. Planning for CT with contrast in 6 weeks.  Complains of increased shortness of breath and wheezing significantly since she finished her third dose of radiation on 12/10/18. She has a reactive dry cough, get up some thick  discolored mucus in the morning. Uses Albuterol nebulizer treatment in morning. Currently on 56m prednisone. Appetite is ok, she is very tired. No antibiotic use recently.   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 Treated with doxycycline course and extended prednisone taper. She is feeling a whole lot better since last visit. Uses nebs two- three times a day. Has deep non-productive cough. States that she has to take a breathing treatment after she showers and is able to get up some thicker white mucus. She is still very tired after radiation treatments. Has a follow up CT chest in 1 month with rad/onc.   01/23/2019 Lori Butler contacted today for videovist. Reports increased shortness of breath and cough x 4-5 day. Occasional wheezing and chest tightness. Hacking cough has gotten worse over the last 1-2 weeks. Cough is mostly dry. [name] have not been helping. She is using her Albuterol rescue inhaler more frequently. Completed extended prednisone taper on Monday. Shortness of breath started a little bit before stopping. O2 88% lowest on exertion, improves with rest. Swelling feet and chin has gone down since being off steroid. She has CT chest scheduled for tomorrow with oncology for follow-up malignant neoplasm of left upper lobe lung.   02/06/2019 Lori Butler contacted today for acute televisit with reports of increased cough. She feels like her breathing is at baseline. Producing more mucus two days ago and low grade temp. Taking Spiriva Handihaler and Symbicort 160. Currently on prednisone 142mdaily. Only uses albuterol nebulizer as needed which is rare. Finished with radiation. Follow-up CT chest showed LUL nodule 1.2 cm unchanged from previous, no new pulmonary nodules/masses. Moderate to advanced changes parseptal and centrilobular emphysema. Biapical pleuroparenchymal scarring. No axillary or supraclavicular adenopathy. No  mediastinal or hilar adenopathy. Per oncology they are pleased with her post treatment imaging and will see her in 6 months for continued evaluation.   03/20/2019  Lori Butler presents today for hospital follow-up. Admitted from 10/23-10/26 for COPD exacerbation treated with steroids, nebulizer's and qualified for supplemental oxygen. She was covid negative. CTA negative for PE. She was also treated with course of antibiotics for RLE cellulitis. She is feeling ok but her breathing worsens when tapering down on prednisone. Feels she goes down hill when off of oral steroids. Reports wheezing and seasonal nasal rhinitis. She is wearing 2L continuous oxygen. Receiving physical therapy twice a week.   Allergies  Allergen Reactions   Demerol [Meperidine] Other (See Comments)    "horrible reaction"   Oxycodone-Acetaminophen Other (See Comments)    Upset stomach    Tramadol Nausea And Vomiting    Immunization History  Administered Date(s) Administered   Influenza Split 01/07/2019   Influenza, High Dose Seasonal PF 01/21/2018, 01/07/2019   Influenza-Unspecified 01/07/2019   Pneumococcal Conjugate-13 12/06/2017    Past Medical History:  Diagnosis Date   Acute respiratory failure with hypoxia (HCC)    Anxiety    Aortic atherosclerosis ([facility]   Arthritis    "a little in my hands probably" (10/31/2017)   COPD (chronic obstructive pulmonary disease) (HCC)    COPD exacerbation (HCC)    Depression    History of kidney stones    Suicide attempt ([facility]CCarrick10/17/2019   overdosed on Wellbutrin    Tobacco History: Social History   Tobacco  Use  Smoking Status Former Smoker   Packs/day: 1.50   Years: 51.00   Pack years: 76.50   Types: Cigarettes   Quit date: 09/05/2017   Years since quitting: 1.5  Smokeless Tobacco Never Used   Counseling given: Not Answered   Outpatient Medications Prior to Visit  Medication Sig Dispense Refill   albuterol (PROVENTIL HFA;VENTOLIN HFA) 108  (90 Base) MCG/ACT inhaler Inhale 2 puffs into the lungs every 6 (six) hours as needed for wheezing or shortness of breath. 1 Inhaler 2   albuterol (PROVENTIL) (2.5 MG/3ML) 0.083% nebulizer solution Take 3 mLs (2.5 mg total) by nebulization every 6 (six) hours as needed for wheezing or shortness of breath. 75 mL 6   alendronate (FOSAMAX) 70 MG tablet Take 70 mg by mouth every Wednesday.      benzonatate (TESSALON) 200 MG capsule Take 1 capsule (200 mg total) by mouth 3 (three) times daily as needed for cough. 30 capsule 1   budesonide-formoterol (SYMBICORT) 160-4.5 MCG/ACT inhaler Inhale 2 puffs into the lungs 2 (two) times daily. 1 Inhaler 0   Cholecalciferol (VITAMIN D) 50 MCG (2000 UT) tablet Take 2,000 Units by mouth daily.     guaiFENesin (MUCINEX) 600 MG 12 hr tablet Take 2 tablets (1,200 mg total) by mouth 2 (two) times daily. (Lori Butler taking differently: Take 600 mg by mouth 2 (two) times daily. ) 30 tablet 0   montelukast (SINGULAIR) 10 MG tablet Take 1 tablet (10 mg total) by mouth at bedtime. 30 tablet 1   pantoprazole (PROTONIX) 40 MG tablet Take 1 tablet (40 mg total) by mouth 2 (two) times daily. (Lori Butler taking differently: Take 40 mg by mouth daily. ) 60 tablet 1   predniSONE (DELTASONE) 10 MG tablet 68m x 4 days; 345mx 4 days; 2064m 4 days; then STAY on 10 mg daily until stopped or changed by provider 60 tablet 0   sertraline (ZOLOFT) 50 MG tablet Take 1 tablet (50 mg total) by mouth daily. 30 tablet 0   tiotropium (SPIRIVA) 18 MCG inhalation capsule Place 18 mcg into inhaler and inhale daily.     atorvastatin (LIPITOR) 10 MG tablet Take 10 mg by mouth daily.     naproxen (NAPROSYN) 375 MG tablet Take 1 tablet (375 mg total) by mouth 2 (two) times daily. (Lori Butler not taking: Reported on 03/20/2019) 20 tablet 0   promethazine-codeine (PHENERGAN WITH CODEINE) 6.25-10 MG/5ML syrup Take 5 mLs by mouth every 6 (six) hours as needed for cough. (Lori Butler not taking: Reported on  02/28/2019) 240 mL 0   tiotropium (SPIRIVA HANDIHALER) 18 MCG inhalation capsule Place 1 capsule (18 mcg total) into inhaler and inhale daily. 30 capsule 11   No facility-administered medications prior to visit.    Review of Systems  Review of Systems  Constitutional: Positive for fatigue.  Respiratory: Positive for shortness of breath and wheezing.   Neurological: Positive for weakness.   Physical Exam  BP 140/78 (BP Location: Left Arm, Cuff Size: Normal)    Pulse (!) 106    Temp (!) 96.2 F (35.7 C) (Temporal)    Ht 5' 4.96" (1.65 m) Comment: with shoes on   Wt 182 lb 6.4 oz (82.7 kg)    SpO2 96%    BMI 30.39 kg/m  Physical Exam Constitutional:      Appearance: Normal appearance.  HENT:     Head: Normocephalic and atraumatic.  Cardiovascular:     Rate and Rhythm: Regular rhythm. Tachycardia present.  Pulmonary:  Effort: Pulmonary effort is normal. No respiratory distress.     Breath sounds: Normal breath sounds. No wheezing or rhonchi.     Comments: CTA; 2L oxygen  Mild dyspnea Musculoskeletal: Normal range of motion.  Skin:    General: Skin is warm and dry.  Neurological:     General: No focal deficit present.     Mental Status: She is alert and oriented to person, place, and time. Mental status is at baseline.  Psychiatric:        Mood and Affect: Mood normal.        Behavior: Behavior normal.        Thought Content: Thought content normal.        Judgment: Judgment normal.      Lab Results:  CBC    Component Value Date/Time   WBC 7.2 03/01/2019 0516   RBC 4.57 03/01/2019 0516   HGB 13.0 03/01/2019 0516   HCT 43.6 03/01/2019 0516   PLT 301 03/01/2019 0516   MCV 95.4 03/01/2019 0516   MCH 28.4 03/01/2019 0516   MCHC 29.8 (L) 03/01/2019 0516   RDW 13.8 03/01/2019 0516   LYMPHSABS 1.7 02/28/2019 1441   MONOABS 0.8 02/28/2019 1441   EOSABS 0.5 02/28/2019 1441   BASOSABS 0.1 02/28/2019 1441    BMET    Component Value Date/Time   NA 139 03/01/2019  0516   NA 140 11/06/2017   K 4.7 03/01/2019 0516   CL 105 03/01/2019 0516   CO2 20 (L) 03/01/2019 0516   GLUCOSE 108 (H) 03/01/2019 0516   BUN 15 03/01/2019 0516   BUN 17 11/06/2017   CREATININE 0.73 03/01/2019 0516   CREATININE 1.00 01/24/2019 1344   CALCIUM 8.7 (L) 03/01/2019 0516   GFRNONAA >60 03/01/2019 0516   GFRNONAA 57 (L) 01/24/2019 1344   GFRAA >60 03/01/2019 0516   GFRAA >60 01/24/2019 1344    BNP    Component Value Date/Time   BNP 32.7 02/28/2019 2153    ProBNP No results found for: PROBNP  Imaging: Dg Chest 2 View  Result Date: 02/19/2019 CLINICAL DATA:  Lori Butler reports to the ED for coughing. Lori Butler last had chemo August 14th. Lori Butler reports she has been coughing so hard that she "thinks she may have cracked a rib" hx copd, lung cancer, ex-smoker EXAM: CHEST - 2 VIEW COMPARISON:  11/11/2018 FINDINGS: Cardiac silhouette is normal in size. No mediastinal or hilar masses. No evidence of adenopathy. Three surgical vascular clips project above and lateral to the left hilum new since the prior radiographs, but stable from a CT dated 01/24/2019. There is opacity extending above the left hilum in the medial left upper lobe that is also unchanged from the prior CT. Lungs are hyperexpanded but otherwise clear. No pleural effusion.  No pneumothorax. Skeletal structures are intact.  No visualized rib fracture. IMPRESSION: 1. No acute cardiopulmonary disease. 2. Left upper lobe opacity medially, above the left hilum, consistent with scarring from treatment of prior lung carcinoma. Active neoplastic disease is not excluded. 3. Lung hyperexpansion consistent with COPD. Electronically Signed   By: Lajean Manes M.D.   On: 02/19/2019 13:21   Ct Angio Chest Pe W/cm &/or Wo Cm  Result Date: 02/28/2019 CLINICAL DATA:  CONTRAST-100ML OMNI 350 Pt BIB by EMS from PCP office pt has been having increasing SOB , with productive cough past 2 days and is DOE at baseline, Pt reports increasing  DOE with position changes. Pt has HX of COPD , Lung Chest pain,  complex, intermediate/high prob of ACS/PE/AAS CA pt w R LE swelling and dyspnea w hypoxia w exertion EXAM: CT ANGIOGRAPHY CHEST WITH CONTRAST TECHNIQUE: Multidetector CT imaging of the chest was performed using the standard protocol during bolus administration of intravenous contrast. Multiplanar CT image reconstructions and MIPs were obtained to evaluate the vascular anatomy. CONTRAST:  176m OMNIPAQUE IOHEXOL 350 MG/ML SOLN COMPARISON:  Chest CT 01/14/2019 FINDINGS: Cardiovascular: No filling defects within the pulmonary arteries to suggest acute pulmonary embolism. No acute findings of the aorta or great vessels. No pericardial fluid. Mediastinum/Nodes: No axillary supraclavicular adenopathy. No mediastinal hilar adenopathy. No pericardial effusion. Esophagus normal. . Lungs/Pleura: Centrilobular emphysema the upper lobes. Angular nodule in the LEFT upper lobe measuring 1.3 by 0.8 cm compares to 1.4 by 0.9 cm on comparison recent exam remeasured. Lesion was hypermetabolic on comparison PET-CT scan of 10/18/2018. Upper Abdomen: Limited view of the liver, kidneys, pancreas are unremarkable. Normal adrenal glands. Musculoskeletal: No aggressive osseous lesion. Review of the MIP images confirms the above findings. IMPRESSION: 1. No evidence acute pulmonary embolism. 2. Spiculated nodule in the LEFT upper lobe hypermetabolic on comparison PET-CT scan and concerning for bronchogenic carcinoma. 3. Emphysema Electronically Signed   By: SSuzy BouchardM.D.   On: 02/28/2019 17:05   Vas UKoreaLower Extremity Venous (dvt) (only Mc & Wl 7a-7p)  Result Date: 03/01/2019  Lower Venous Study Indications: Edema.  Risk Factors: None identified. Limitations: Body habitus and poor ultrasound/tissue interface. Comparison Study: No prior studies. Performing Technologist: GOliver HumRVT  Examination Guidelines: A complete evaluation includes B-mode imaging,  spectral Doppler, color Doppler, and power Doppler as needed of all accessible portions of each vessel. Bilateral testing is considered an integral part of a complete examination. Limited examinations for reoccurring indications may be performed as noted.  +---------+---------------+---------+-----------+----------+--------------+  RIGHT     Compressibility Phasicity Spontaneity Properties Thrombus Aging  +---------+---------------+---------+-----------+----------+--------------+  CFV       Full            Yes       Yes                                    +---------+---------------+---------+-----------+----------+--------------+  SFJ       Full                                                             +---------+---------------+---------+-----------+----------+--------------+  FV Prox                   Yes       Yes                                    +---------+---------------+---------+-----------+----------+--------------+  FV Mid    Full                                                             +---------+---------------+---------+-----------+----------+--------------+  FV Distal Full                                                             +---------+---------------+---------+-----------+----------+--------------+  PFV       Full                                                             +---------+---------------+---------+-----------+----------+--------------+  POP       Full            Yes       Yes                                    +---------+---------------+---------+-----------+----------+--------------+  PTV       Full                                                             +---------+---------------+---------+-----------+----------+--------------+  PERO      Full                                                             +---------+---------------+---------+-----------+----------+--------------+   +----+---------------+---------+-----------+----------+--------------+   LEFT Compressibility Phasicity Spontaneity Properties Thrombus Aging  +----+---------------+---------+-----------+----------+--------------+  CFV  Full            Yes       Yes                                    +----+---------------+---------+-----------+----------+--------------+     Summary: Right: There is no evidence of deep vein thrombosis in the lower extremity. However, portions of this examination were limited- see technologist comments above. No cystic structure found in the popliteal fossa. Left: No evidence of common femoral vein obstruction.  *See table(s) above for measurements and observations. Electronically signed by Monica Martinez MD on 03/01/2019 at 10:34:45 AM.    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

## 2019-03-25 ENCOUNTER — Other Ambulatory Visit: Payer: Self-pay

## 2019-03-25 DIAGNOSIS — J9621 Acute and chronic respiratory failure with hypoxia: Secondary | ICD-10-CM | POA: Diagnosis not present

## 2019-03-25 DIAGNOSIS — E559 Vitamin D deficiency, unspecified: Secondary | ICD-10-CM | POA: Diagnosis not present

## 2019-03-25 DIAGNOSIS — I700 Atherosclerosis of aorta: Secondary | ICD-10-CM | POA: Diagnosis not present

## 2019-03-25 DIAGNOSIS — J439 Emphysema, unspecified: Secondary | ICD-10-CM | POA: Diagnosis not present

## 2019-03-25 DIAGNOSIS — C3412 Malignant neoplasm of upper lobe, left bronchus or lung: Secondary | ICD-10-CM | POA: Diagnosis not present

## 2019-03-25 DIAGNOSIS — F419 Anxiety disorder, unspecified: Secondary | ICD-10-CM | POA: Diagnosis not present

## 2019-03-25 DIAGNOSIS — F332 Major depressive disorder, recurrent severe without psychotic features: Secondary | ICD-10-CM | POA: Diagnosis not present

## 2019-03-25 DIAGNOSIS — L03115 Cellulitis of right lower limb: Secondary | ICD-10-CM | POA: Diagnosis not present

## 2019-03-25 DIAGNOSIS — M810 Age-related osteoporosis without current pathological fracture: Secondary | ICD-10-CM | POA: Diagnosis not present

## 2019-03-25 NOTE — Patient Outreach (Signed)
[facility]) Care Management  03/25/2019  Lori Butler Heal Feb 18, 1951 [id number]   Telephone Assessment    Outreach attempt #1 to patient. Spoke with patient who denies any acute issues or concerns at present. Patient pleased to report that so far today is her "best day yet" since she has came home from the hospital. She shares that she went for pulmonology follow up and had her meds changes. She was taken off of Symbicort and Spiriva and given a two week trial of Trelegy. Patient voices that just within a few days of being on the med she can tell a difference. She has been able to go without using oxygen. HHPT came today and patient was able to walk for three minutes without getting SOB. She reports that her pulse ox at present is 97%RA. She shares that she also remains on tapering dose of Prednisone. Patient voices that she is hopeful that this new med will continue to help her better manage her breathing. She goes back for follow up with pulmonologist on 04/10/2019. She denies any RN CM needs or concerns at present.   THN CM Care Plan Problem One     Most Recent Value  Care Plan Problem One  Knowledge deficit related to disease mgmt and sx mgmt of COPD  Role Documenting the Problem One  Care Management Telephonic Coordinator  Care Plan for Problem One  Active  THN Long Term Goal   Patient will have no COPD related readmission over the next 31 days.  THN Long Term Goal Start Date  03/06/19  Interventions for Problem One Long Term Goal  RN CM assessed for any acute issues or concerns. RNCM reviewed action plan with patient. RN CM discussed med mgmt for sx mgmt.  THN CM Short Term Goal #1   patient will complete all MD follow up appts over the next 30 days.  THN CM Short Term Goal #1 Start Date  03/06/19  THN CM Short Term Goal #1 Met Date  03/25/19  THN CM Short Term Goal #2   Patient will report resolution of resp infection over the next 30 days.  THN CM Short Term Goal  #2 Start Date  03/06/19  [facility] CM Short Term Goal #2 Met Date  03/25/19    [facility] CM Care Plan Problem Two     Most Recent Value  Care Plan Problem Two  Deconditioning-working with HHPT to regain strength,endurance and mobility.   Role Documenting the Problem Two  Care Management Telephonic Coordinator  Care Plan for Problem Two  Active  THN CM Short Term Goal #1   Patient will be able to tolerate and complete HHPT sessions and report improvement in condition over the next 30 days.  THN CM Short Term Goal #1 Start Date  03/25/19  Interventions for Short Term Goal #2   RN CM confirmed that [facility] services in place. RN CM assessed patient's progress and level of participation with therapy.      Plan: RN CM discussed with patient next outreach within a month. Patient gave verbal consent and in agreement with RN CM follow up timeframe. Patient aware that they may contact RN CM sooner for any issues or concerns.   [name], RN,BSN,CCM [facility] Telephonic Care Management Coordinator Direct Phone: [phone] Toll Free: [phone] Fax: [phone]

## 2019-03-27 ENCOUNTER — Ambulatory Visit: Payer: Self-pay

## 2019-03-27 DIAGNOSIS — C3412 Malignant neoplasm of upper lobe, left bronchus or lung: Secondary | ICD-10-CM | POA: Diagnosis not present

## 2019-03-27 DIAGNOSIS — F332 Major depressive disorder, recurrent severe without psychotic features: Secondary | ICD-10-CM | POA: Diagnosis not present

## 2019-03-27 DIAGNOSIS — M810 Age-related osteoporosis without current pathological fracture: Secondary | ICD-10-CM | POA: Diagnosis not present

## 2019-03-27 DIAGNOSIS — I700 Atherosclerosis of aorta: Secondary | ICD-10-CM | POA: Diagnosis not present

## 2019-03-27 DIAGNOSIS — E559 Vitamin D deficiency, unspecified: Secondary | ICD-10-CM | POA: Diagnosis not present

## 2019-03-27 DIAGNOSIS — F419 Anxiety disorder, unspecified: Secondary | ICD-10-CM | POA: Diagnosis not present

## 2019-03-27 DIAGNOSIS — J9621 Acute and chronic respiratory failure with hypoxia: Secondary | ICD-10-CM | POA: Diagnosis not present

## 2019-03-27 DIAGNOSIS — L03115 Cellulitis of right lower limb: Secondary | ICD-10-CM | POA: Diagnosis not present

## 2019-03-27 DIAGNOSIS — J439 Emphysema, unspecified: Secondary | ICD-10-CM | POA: Diagnosis not present

## 2019-03-31 ENCOUNTER — Telehealth: Payer: Self-pay | Admitting: Pulmonary Disease

## 2019-03-31 DIAGNOSIS — M810 Age-related osteoporosis without current pathological fracture: Secondary | ICD-10-CM | POA: Diagnosis not present

## 2019-03-31 DIAGNOSIS — L03115 Cellulitis of right lower limb: Secondary | ICD-10-CM | POA: Diagnosis not present

## 2019-03-31 DIAGNOSIS — C3412 Malignant neoplasm of upper lobe, left bronchus or lung: Secondary | ICD-10-CM | POA: Diagnosis not present

## 2019-03-31 DIAGNOSIS — J439 Emphysema, unspecified: Secondary | ICD-10-CM | POA: Diagnosis not present

## 2019-03-31 DIAGNOSIS — J441 Chronic obstructive pulmonary disease with (acute) exacerbation: Secondary | ICD-10-CM | POA: Diagnosis not present

## 2019-03-31 DIAGNOSIS — E559 Vitamin D deficiency, unspecified: Secondary | ICD-10-CM | POA: Diagnosis not present

## 2019-03-31 DIAGNOSIS — F322 Major depressive disorder, single episode, severe without psychotic features: Secondary | ICD-10-CM | POA: Diagnosis not present

## 2019-03-31 DIAGNOSIS — F332 Major depressive disorder, recurrent severe without psychotic features: Secondary | ICD-10-CM | POA: Diagnosis not present

## 2019-03-31 DIAGNOSIS — J9621 Acute and chronic respiratory failure with hypoxia: Secondary | ICD-10-CM | POA: Diagnosis not present

## 2019-03-31 DIAGNOSIS — J449 Chronic obstructive pulmonary disease, unspecified: Secondary | ICD-10-CM | POA: Diagnosis not present

## 2019-03-31 DIAGNOSIS — I700 Atherosclerosis of aorta: Secondary | ICD-10-CM | POA: Diagnosis not present

## 2019-03-31 DIAGNOSIS — F419 Anxiety disorder, unspecified: Secondary | ICD-10-CM | POA: Diagnosis not present

## 2019-03-31 DIAGNOSIS — I2510 Atherosclerotic heart disease of native coronary artery without angina pectoris: Secondary | ICD-10-CM | POA: Diagnosis not present

## 2019-03-31 DIAGNOSIS — E7800 Pure hypercholesterolemia, unspecified: Secondary | ICD-10-CM | POA: Diagnosis not present

## 2019-03-31 MED ORDER — TRELEGY ELLIPTA 200-62.5-25 MCG/INH IN AEPB: 1.0000 | INHALATION_SPRAY | Freq: Every day | RESPIRATORY_TRACT | 3 refills | Status: DC

## 2019-03-31 NOTE — Telephone Encounter (Signed)
Spoke with the pt and she states Trelegy 200 is helping her breathing a lot  Wants rx sent to pharm  Rx was sent to [facility]  Reminded her to keep appt with Dr. Elsworth Soho in Dec 2020  Nothing further needed

## 2019-04-08 DIAGNOSIS — F419 Anxiety disorder, unspecified: Secondary | ICD-10-CM | POA: Diagnosis not present

## 2019-04-08 DIAGNOSIS — F332 Major depressive disorder, recurrent severe without psychotic features: Secondary | ICD-10-CM | POA: Diagnosis not present

## 2019-04-08 DIAGNOSIS — L03115 Cellulitis of right lower limb: Secondary | ICD-10-CM | POA: Diagnosis not present

## 2019-04-08 DIAGNOSIS — C3412 Malignant neoplasm of upper lobe, left bronchus or lung: Secondary | ICD-10-CM | POA: Diagnosis not present

## 2019-04-08 DIAGNOSIS — E559 Vitamin D deficiency, unspecified: Secondary | ICD-10-CM | POA: Diagnosis not present

## 2019-04-08 DIAGNOSIS — J9621 Acute and chronic respiratory failure with hypoxia: Secondary | ICD-10-CM | POA: Diagnosis not present

## 2019-04-08 DIAGNOSIS — J439 Emphysema, unspecified: Secondary | ICD-10-CM | POA: Diagnosis not present

## 2019-04-08 DIAGNOSIS — I700 Atherosclerosis of aorta: Secondary | ICD-10-CM | POA: Diagnosis not present

## 2019-04-08 DIAGNOSIS — M810 Age-related osteoporosis without current pathological fracture: Secondary | ICD-10-CM | POA: Diagnosis not present

## 2019-04-10 ENCOUNTER — Other Ambulatory Visit: Payer: Self-pay

## 2019-04-10 ENCOUNTER — Ambulatory Visit (INDEPENDENT_AMBULATORY_CARE_PROVIDER_SITE_OTHER): Payer: Medicare HMO | Admitting: Pulmonary Disease

## 2019-04-10 ENCOUNTER — Encounter: Payer: Self-pay | Admitting: Pulmonary Disease

## 2019-04-10 DIAGNOSIS — E559 Vitamin D deficiency, unspecified: Secondary | ICD-10-CM | POA: Diagnosis not present

## 2019-04-10 DIAGNOSIS — R0902 Hypoxemia: Secondary | ICD-10-CM

## 2019-04-10 DIAGNOSIS — F419 Anxiety disorder, unspecified: Secondary | ICD-10-CM | POA: Diagnosis not present

## 2019-04-10 DIAGNOSIS — J449 Chronic obstructive pulmonary disease, unspecified: Secondary | ICD-10-CM | POA: Diagnosis not present

## 2019-04-10 DIAGNOSIS — I700 Atherosclerosis of aorta: Secondary | ICD-10-CM | POA: Diagnosis not present

## 2019-04-10 DIAGNOSIS — L03115 Cellulitis of right lower limb: Secondary | ICD-10-CM | POA: Diagnosis not present

## 2019-04-10 DIAGNOSIS — M810 Age-related osteoporosis without current pathological fracture: Secondary | ICD-10-CM | POA: Diagnosis not present

## 2019-04-10 DIAGNOSIS — C3412 Malignant neoplasm of upper lobe, left bronchus or lung: Secondary | ICD-10-CM | POA: Diagnosis not present

## 2019-04-10 DIAGNOSIS — J9621 Acute and chronic respiratory failure with hypoxia: Secondary | ICD-10-CM | POA: Diagnosis not present

## 2019-04-10 DIAGNOSIS — J439 Emphysema, unspecified: Secondary | ICD-10-CM | POA: Diagnosis not present

## 2019-04-10 DIAGNOSIS — F332 Major depressive disorder, recurrent severe without psychotic features: Secondary | ICD-10-CM | POA: Diagnosis not present

## 2019-04-10 NOTE — Progress Notes (Signed)
   63 yoex-smokerfor FU of moderate COPD She quit smoking 09/2017 Sudden onset and variability suggests a component of asthma  She had 2 hospitalizations 12/2017 for acute hypercarbic respiratory failure with change in mental status. Each time she improved quickly and did not require oxygen on discharge  She underwent navigation biopsy of left upper lobe nodule showing atypical cells and then underwent SBRT 12/2018  Admitted from 10/23-10/26 for COPD exacerbation treated with steroids, nebulizer's and qualified for supplemental oxygen. She was covid negative. CTA negative for PE  She was started on Trelegy last visit and since then her breathing is improved dramatically.  She has not needed albuterol MDI or nebs.  She remains on 10 mg of prednisone Her oxygen saturation has ranged from 93% when walking and 98% at rest and she has only used oxygen a couple of times in the last month  We reviewed her last imaging studies from 01/26/2019 and 02/25/2019   Significant tests/ events reviewed  CT chest 10/14/2018 -bilateral upper lobe parenchymal scarring on her prior CT and left upper lobe scar that appeared more nodular on the CT measuring about 13 mm PET scan showed hypermetabolism with SUV 8 in this nodule   PFTs 10/2018 showed moderate reversible airway obstruction with FEV1 of 37%-0.91 which improved to 47%-1.1L. DLCO was about 54%   CT angio 09/2017 >>Emphysema andInspissated mucus in the LEFT LOWER lobe bronchi,mild fibrotic changes upper lobes  PFTs 10/2017-moderate airway obstruction, ratio 59, FEV1 51%, FVC 65%, no significantBDresponse, DLCO 57%   Exam unable  Impression and plan as outlined in problem list  Total time including review of imaging studies x 17m  Lori Butler V. Elsworth Soho MD

## 2019-04-10 NOTE — Assessment & Plan Note (Signed)
Improved, not needing oxygen as much Okay to stay off oxygen for now

## 2019-04-10 NOTE — Assessment & Plan Note (Signed)
Follow-up surveillance imaging in March 2020

## 2019-04-10 NOTE — Patient Instructions (Signed)
Decrease prednisone to 5 mg daily Stay on this dose until end January -and if you stay well until then we can consider decreasing this further  8-month follow-up visit

## 2019-04-10 NOTE — Assessment & Plan Note (Signed)
Trelegy has really worked well for her.  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  She will call us as needed

## 2019-04-14 ENCOUNTER — Telehealth: Payer: Self-pay | Admitting: Pulmonary Disease

## 2019-04-14 DIAGNOSIS — E559 Vitamin D deficiency, unspecified: Secondary | ICD-10-CM | POA: Diagnosis not present

## 2019-04-14 DIAGNOSIS — I700 Atherosclerosis of aorta: Secondary | ICD-10-CM | POA: Diagnosis not present

## 2019-04-14 DIAGNOSIS — M810 Age-related osteoporosis without current pathological fracture: Secondary | ICD-10-CM | POA: Diagnosis not present

## 2019-04-14 DIAGNOSIS — F419 Anxiety disorder, unspecified: Secondary | ICD-10-CM | POA: Diagnosis not present

## 2019-04-14 DIAGNOSIS — C3412 Malignant neoplasm of upper lobe, left bronchus or lung: Secondary | ICD-10-CM | POA: Diagnosis not present

## 2019-04-14 DIAGNOSIS — J439 Emphysema, unspecified: Secondary | ICD-10-CM | POA: Diagnosis not present

## 2019-04-14 DIAGNOSIS — L03115 Cellulitis of right lower limb: Secondary | ICD-10-CM | POA: Diagnosis not present

## 2019-04-14 DIAGNOSIS — J9621 Acute and chronic respiratory failure with hypoxia: Secondary | ICD-10-CM | POA: Diagnosis not present

## 2019-04-14 DIAGNOSIS — F332 Major depressive disorder, recurrent severe without psychotic features: Secondary | ICD-10-CM | POA: Diagnosis not present

## 2019-04-14 MED ORDER — PREDNISONE 5 MG PO TABS: 5.0000 mg | ORAL_TABLET | Freq: Every day | ORAL | 0 refills | Status: DC

## 2019-04-14 NOTE — Telephone Encounter (Signed)
  Spoke with pt and advised rx sent to pharmacy. Nothing further is needed.    Patient Instructions by Rigoberto Noel, MD at 04/10/2019 9:30 AM Author: Rigoberto Noel, MD Author Type: Physician Filed: 04/10/2019 10:07 AM  Note Status: Signed Cosign: Cosign Not Required Encounter Date: 04/10/2019  Editor: Rigoberto Noel, MD (Physician)    Decrease prednisone to 5 mg daily Stay on this dose until end January -and if you stay well until then we can consider decreasing this further  74-month follow-up visit

## 2019-04-16 ENCOUNTER — Encounter: Payer: Self-pay | Admitting: *Deleted

## 2019-04-22 DIAGNOSIS — F332 Major depressive disorder, recurrent severe without psychotic features: Secondary | ICD-10-CM | POA: Diagnosis not present

## 2019-04-22 DIAGNOSIS — C3412 Malignant neoplasm of upper lobe, left bronchus or lung: Secondary | ICD-10-CM | POA: Diagnosis not present

## 2019-04-22 DIAGNOSIS — F419 Anxiety disorder, unspecified: Secondary | ICD-10-CM | POA: Diagnosis not present

## 2019-04-22 DIAGNOSIS — J9621 Acute and chronic respiratory failure with hypoxia: Secondary | ICD-10-CM | POA: Diagnosis not present

## 2019-04-22 DIAGNOSIS — M810 Age-related osteoporosis without current pathological fracture: Secondary | ICD-10-CM | POA: Diagnosis not present

## 2019-04-22 DIAGNOSIS — J439 Emphysema, unspecified: Secondary | ICD-10-CM | POA: Diagnosis not present

## 2019-04-22 DIAGNOSIS — I700 Atherosclerosis of aorta: Secondary | ICD-10-CM | POA: Diagnosis not present

## 2019-04-22 DIAGNOSIS — L03115 Cellulitis of right lower limb: Secondary | ICD-10-CM | POA: Diagnosis not present

## 2019-04-22 DIAGNOSIS — E559 Vitamin D deficiency, unspecified: Secondary | ICD-10-CM | POA: Diagnosis not present

## 2019-04-24 ENCOUNTER — Other Ambulatory Visit: Payer: Self-pay

## 2019-04-24 NOTE — Patient Outreach (Signed)
[facility] [facility]) Care Management  04/24/2019  Lori Butler 1950/11/14 [id number]   Telephone Assessment   Outreach attempt #1 to patient. Spoke with patient who denies any acute issues or concerns at present. However, she does report that she has "picked up a cough." She states she started having a cough yesterday evening. She reports cough is dry at times and then other times she is coughing up "thick sputum" but voices sputum does not have color. She states that she did take some Tessalon on last evening. Patient denies having any other resp sxs. She reports that she plans to call pulmonologist today to alert them of her issue and see what they recommend she do. She denies being out around people or been exposed to COVID-19. Patient reports no SOB and continues to states she has not had to use oxygen since starting Trelegy/ She reports that next week she will complete HHPT and has made good progress with therapy. She denies any RN CM needs or concerns at this time.    THN CM Care Plan Problem One     Most Recent Value  Care Plan Problem One  Knowledge deficit related to disease mgmt and sx mgmt of COPD  Role Documenting the Problem One  Care Management Telephonic Coordinator  Care Plan for Problem One  Active  THN Long Term Goal   Patient will have no COPD related readmission over the next 31 days.  THN Long Term Goal Start Date  03/06/19  THN Long Term Goal Met Date  04/24/19  [facility] CM Short Term Goal #2   Patient will report resolution of resp sxs over the next 30 days.  THN CM Short Term Goal #2 Start Date  04/24/19  Interventions for Short Term Goal #2  [facility] discussed and reviewed action plan with pt, med mgmt of sxs and when to seek medical attention.    THN CM Care Plan Problem Two     Most Recent Value  Care Plan Problem Two  Deconditioning-working with HHPT to regain strength,endurance and mobility.   Role Documenting the Problem Two  Care Management  Telephonic Coordinator  Care Plan for Problem Two  Active  THN CM Short Term Goal #1   Patient will be able to tolerate and complete HHPT sessions and report improvement in condition over the next 30 days.  THN CM Short Term Goal #1 Start Date  03/25/19  THN CM Short Term Goal #1 Met Date   04/24/19    [facility] CM Care Plan Problem Three     Most Recent Value  Care Plan Problem Three  Patient with history of lung CA  Role Documenting the Problem Three  Care Management Telephonic Coordinator  Care Plan for Problem Three  Active  THN Long Term Goal   Patient will report having scans/testings done to assess for lung CA status within the next 90 days.  THN Long Term Goal Start Date  04/24/19  Interventions for Problem Three Long Term Goal  RN CM discussed upcoming appts and importance of workup to determine next course of tx plan     Plan: RN CM discussed with patient next outreach within the month of January. Patient gave verbal consent and in agreement with RN CM follow up and timeframe. Patient aware that they may contact RN CM sooner for any issues or concerns.  [name], RN,BSN,CCM [place] Management Telephonic Care Management Coordinator Direct Phone: [phone] Toll Free: [phone] Fax: [phone]

## 2019-04-28 DIAGNOSIS — M810 Age-related osteoporosis without current pathological fracture: Secondary | ICD-10-CM | POA: Diagnosis not present

## 2019-04-28 DIAGNOSIS — L03115 Cellulitis of right lower limb: Secondary | ICD-10-CM | POA: Diagnosis not present

## 2019-04-28 DIAGNOSIS — F332 Major depressive disorder, recurrent severe without psychotic features: Secondary | ICD-10-CM | POA: Diagnosis not present

## 2019-04-28 DIAGNOSIS — C3412 Malignant neoplasm of upper lobe, left bronchus or lung: Secondary | ICD-10-CM | POA: Diagnosis not present

## 2019-04-28 DIAGNOSIS — E559 Vitamin D deficiency, unspecified: Secondary | ICD-10-CM | POA: Diagnosis not present

## 2019-04-28 DIAGNOSIS — I700 Atherosclerosis of aorta: Secondary | ICD-10-CM | POA: Diagnosis not present

## 2019-04-28 DIAGNOSIS — F419 Anxiety disorder, unspecified: Secondary | ICD-10-CM | POA: Diagnosis not present

## 2019-04-28 DIAGNOSIS — J9621 Acute and chronic respiratory failure with hypoxia: Secondary | ICD-10-CM | POA: Diagnosis not present

## 2019-04-28 DIAGNOSIS — J439 Emphysema, unspecified: Secondary | ICD-10-CM | POA: Diagnosis not present

## 2019-05-07 DIAGNOSIS — M810 Age-related osteoporosis without current pathological fracture: Secondary | ICD-10-CM | POA: Diagnosis not present

## 2019-05-07 DIAGNOSIS — F322 Major depressive disorder, single episode, severe without psychotic features: Secondary | ICD-10-CM | POA: Diagnosis not present

## 2019-05-07 DIAGNOSIS — E7800 Pure hypercholesterolemia, unspecified: Secondary | ICD-10-CM | POA: Diagnosis not present

## 2019-05-07 DIAGNOSIS — C3412 Malignant neoplasm of upper lobe, left bronchus or lung: Secondary | ICD-10-CM | POA: Diagnosis not present

## 2019-05-07 DIAGNOSIS — J441 Chronic obstructive pulmonary disease with (acute) exacerbation: Secondary | ICD-10-CM | POA: Diagnosis not present

## 2019-05-07 DIAGNOSIS — J449 Chronic obstructive pulmonary disease, unspecified: Secondary | ICD-10-CM | POA: Diagnosis not present

## 2019-05-07 DIAGNOSIS — I2510 Atherosclerotic heart disease of native coronary artery without angina pectoris: Secondary | ICD-10-CM | POA: Diagnosis not present

## 2019-05-11 DIAGNOSIS — J449 Chronic obstructive pulmonary disease, unspecified: Secondary | ICD-10-CM | POA: Diagnosis not present

## 2019-05-11 DIAGNOSIS — C3412 Malignant neoplasm of upper lobe, left bronchus or lung: Secondary | ICD-10-CM | POA: Diagnosis not present

## 2019-05-16 ENCOUNTER — Other Ambulatory Visit: Payer: Self-pay | Admitting: Pulmonary Disease

## 2019-05-19 ENCOUNTER — Other Ambulatory Visit: Payer: Self-pay

## 2019-05-19 NOTE — Patient Outreach (Signed)
[place] [facility]) Care Management  05/19/2019  Lori Butler 1950/11/23 [id number]   Telephone Assessment   Outreach attempt #1 to patient. Spoke with patient who voices that she has not felt good for about the past two and half days. She voices that it is hard to pinpoint exactly what is wrong. She shares that she thinks she may have a kidney infection. She has noticed that her urine has been darker. She denies any other s/s of kidney/bladder infection. She reports that she is drinking cranberry juice and plenty of fluids and does not feel like she is dehydrated. Appetite has been fairly normal. She reports that she has felt "tired" more that usual. She denies any SOB or trouble breathing. She continues to voice that Trelegy is working very well for her. Her oxygen sats remain in the high 90s even with ambulation per patient report. She has not contacted MD office to advise of her symptoms and was encouraged to do so. She voices that she will call office today. She denies any RN CM needs or concerns at present.     THN CM Care Plan Problem One     Most Recent Value  Care Plan Problem One  Knowledge deficit related to disease mgmt and sx mgmt of COPD  Role Documenting the Problem One  Care Management Telephonic Coordinator  Care Plan for Problem One  Active  THN Long Term Goal   Patient will have no hospitalizations over the next 90 days.  THN Long Term Goal Start Date  05/19/19  Interventions for Problem One Long Term Goal  RN CM assessed for any acute issues/sxs. RN CM reviewed action plan. RN CM confirmed pt. knows when,how and why to contact MD.  [facility] CM Short Term Goal #1   patient with report contacting PCP to discuss acute sxs within the next 30 days  THN CM Short Term Goal #1 Start Date  05/19/19  Interventions for Short Term Goal #1  RN CM assessed pt for aute sxs and discussed possible causes. RN CM confirmed pt knows how to contact MD.     [facility] CM Care Plan Problem  Three     Most Recent Value  Care Plan Problem Three  Patient with history of lung CA  Role Documenting the Problem Three  Care Management Telephonic Coordinator  Care Plan for Problem Three  Active  [facility] Long Term Goal   Patient will report having scans/testings done to assess for lung CA status within the next 90 days.  THN Long Term Goal Start Date  04/24/19      Plan: RN CM discussed with patient next outreach within the month of February. Patient gave verbal consent and in agreement with RN CM follow up and timeframe. Patient aware that they may contact RN CM sooner for any issues or concerns. RN CM will send quarterly update to PCP.   [name], RN,BSN,CCM [place] Management Telephonic Care Management Coordinator Direct Phone: [phone] Toll Free: [phone] Fax: [phone]

## 2019-05-20 ENCOUNTER — Other Ambulatory Visit: Payer: Medicare HMO

## 2019-05-20 DIAGNOSIS — F322 Major depressive disorder, single episode, severe without psychotic features: Secondary | ICD-10-CM | POA: Diagnosis not present

## 2019-05-20 DIAGNOSIS — E7800 Pure hypercholesterolemia, unspecified: Secondary | ICD-10-CM | POA: Diagnosis not present

## 2019-05-20 DIAGNOSIS — C3412 Malignant neoplasm of upper lobe, left bronchus or lung: Secondary | ICD-10-CM | POA: Diagnosis not present

## 2019-05-20 DIAGNOSIS — I2510 Atherosclerotic heart disease of native coronary artery without angina pectoris: Secondary | ICD-10-CM | POA: Diagnosis not present

## 2019-05-20 DIAGNOSIS — J449 Chronic obstructive pulmonary disease, unspecified: Secondary | ICD-10-CM | POA: Diagnosis not present

## 2019-05-20 DIAGNOSIS — J441 Chronic obstructive pulmonary disease with (acute) exacerbation: Secondary | ICD-10-CM | POA: Diagnosis not present

## 2019-05-20 DIAGNOSIS — M810 Age-related osteoporosis without current pathological fracture: Secondary | ICD-10-CM | POA: Diagnosis not present

## 2019-05-28 ENCOUNTER — Encounter: Payer: Self-pay | Admitting: Primary Care

## 2019-05-28 ENCOUNTER — Telehealth (INDEPENDENT_AMBULATORY_CARE_PROVIDER_SITE_OTHER): Payer: Medicare HMO | Admitting: Primary Care

## 2019-05-28 DIAGNOSIS — J441 Chronic obstructive pulmonary disease with (acute) exacerbation: Secondary | ICD-10-CM | POA: Diagnosis not present

## 2019-05-28 MED ORDER — DOXYCYCLINE HYCLATE 100 MG PO TABS: 100.0000 mg | ORAL_TABLET | Freq: Two times a day (BID) | ORAL | 0 refills | Status: DC

## 2019-05-28 NOTE — Patient Instructions (Signed)
  COPD exacerbation: - Productive cough with purulent mucus x 2 days. No associated bronchospasm. - RX doxycycline 1 tab twice daily x 7 days - Continue Trelegy 1 puff daily; prn albuterol hfa/neb q 4-6 hours for breakthrough sob.wheezing - Continue prednisone 5mg  daily  - If no improvement or symptoms worsen recommend CXR - Low suspicion for COVID   Follow-up: - Due for regular follow-up in April or sooner if symptoms do not improve/worsen

## 2019-05-28 NOTE — Progress Notes (Signed)
Virtual Visit via Video Note  I connected with Lori Butler on 05/28/19 at  4:00 PM EST by a video enabled telemedicine application and verified that I am speaking with the correct person using two identifiers.  Location: Patient: Home Provider: Office   I discussed the limitations of evaluation and management by telemedicine and the availability of in person appointments. The patient expressed understanding and agreed to proceed.  History of Present Illness:  69 year old female, former smoker quit May 2019 (74 pack year hx). PMH significant for COPD, malignant neoplasm upper lobe left lung, hypoxia, anxiety/depression, attempted suicide, tobacco abuse. Patient of Dr. Elsworth Soho. Eos absolute in August 2019 700. Sudden onset and variability seem to suggest component of asthma. Maintained on Trelegy.  Previous [place] [facility] encounter: 09/16/2018- Televisit, 2 month fu- [place] Patient called today for 2 month follow-up visit. Complains of shortness of breath over the past couple of days. Associated cough with thick white mucus, dark in the am and then clears. She does get relief from oral steroid course, feels better for a about a week or two. No fever, sick contacts or flu-like symptoms. Advised to stay on prednisone 69m daily x 6 weeks until follow-up. Referred to lung cancer screening clinic.   10/22/2018- Televisit, PET scan results - Dr. AElsworth Soho67 yoex-smokerfor FU of moderate COPD/asthma Sudden onset & variability suggesting a component of asthma She quit smoking 09/2017 She has been maintained on 10 mg of prednisone since she had a phone visit 09/16/2018 for COPD exacerbation.  She has done well since then on a breathing standpoint.  She is maintained on Spiriva and Symbicort with which she is compliant.  She takes albuterol nebs 2-3 times a day when she remembers to take this. She was referred to lung cancer screening and underwent CT chest 10/14/2018 which I reviewed with her in  detail-she had bilateral upper lobe parenchymal scarring on her prior CT and left upper lobe scar that appeared more nodular on the CT measuring about 13 mm PET scan showed hypermetabolism with SUV 8 in this nodule She underwent PFTs which showed moderate reversible airway obstruction with FEV1 of 37%-0.91 which improved to 47%-1.1L DLCO was about 54%  11/07/2018-Roma KayserNP Patient presents today for 6 week follow-up office visit. Since last televisit with myself patient met with lung cancer screening program and LDCT showed lung RADS 4x. PET scan positive LUL hypermetabolic spiculated pulmonary nodules consistent with primary bronchogenic carcinoma. Referred to CT surgery for second opinion. Seen by Dr. HRoxan Hockeyon 10/29/18. 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 Potential treatment options were discussed. Patient is not interested in surgery and is strongly interested in radiation therapy. Plan navigation bronchoscopy for bx and fiducial placement on Monday 11/11/18. She is doing well today, breathing has improved since starting prednisone 126mdaily. Plan to taper 106m81m 4 weeks and then stop. Continues Symbicort twice daily and Spriva once daily. Using nebulizer twice daily. Morning cough with clear mucus.   11/26/18- Video visit, [name] NP Treated for COPD exacerbation with prednisone taper. Advised mucinex DM twice daily. Continue Symbicort and Spiriva. Albuterol neb every 6 hours prn.   12/16/2018  Patient presents today for 1 month follow-up. She had nav bronchoscopy with Dr. HenMichaelene Song 7/6 with fiducial placement. Pathology and cytology were non-diagnostic with atypical cells no malignant cells. [facility] versus stereotactic therapy. Patient did not wish to wait and wanted to  proceed with radiation therapy despite definitive carcinoma. She began SBRT 12/03/18 and has had three treatments total. Last treatment  8/4. Planning for CT with contrast in 6 weeks. Complains of increased shortness of breath and wheezing significantly since she finished her third dose of radiation on 12/10/18. She has a reactive dry cough, get up some thick discolored mucus in the morning. Uses Albuterol nebulizer treatment in morning. Currently on 62m prednisone. Appetite is ok, she is very tired. No antibiotic use recently.   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 Treated with doxycycline course and extended prednisone taper. She is feeling a whole lot better since last visit. Uses nebs two- three times a day. Has deep non-productive cough. States that she has to take a breathing treatment after she showers and is able to get up some thicker white mucus. She is still very tired after radiation treatments. Has a follow up CT chest in 1 month with rad/onc.   01/23/2019 Patient contacted today for videovist. Reports increased shortness of breath and cough x 4-5 day. Occasional wheezing and chest tightness. Hacking cough has gotten worse over the last 1-2 weeks. Cough is mostly dry. [name] have not been helping. She is using her Albuterol rescue inhaler more frequently. Completed extended prednisone taper on Monday. Shortness of breath started a little bit before stopping. O2 88% lowest on exertion, improves with rest. Swelling feet and chin has gone down since being off steroid. She has CT chest scheduled for tomorrow with oncology for follow-up malignant neoplasm of left upper lobe lung.   02/06/2019 Patient contacted today for acute televisit with reports of increased cough. She feels like her breathing is at baseline. Producing more mucus two days ago and low grade temp. Taking Spiriva Handihaler and Symbicort 160. Currently on prednisone 164mdaily. Only uses albuterol nebulizer as needed which is rare. Finished with radiation. Follow-up CT chest showed LUL  nodule 1.2 cm unchanged from previous, no new pulmonary nodules/masses. Moderate to advanced changes parseptal and centrilobular emphysema. Biapical pleuroparenchymal scarring. No axillary or supraclavicular adenopathy. No mediastinal or hilar adenopathy. Per oncology they are pleased with her post treatment imaging and will see her in 6 months for continued evaluation.   03/20/2019  Patient presents today for hospital follow-up. Admitted from 10/23-10/26 for COPD exacerbation treated with steroids, nebulizer's and qualified for supplemental oxygen. She was covid negative. CTA negative for PE. She was also treated with course of antibiotics for RLE cellulitis. She is feeling ok but her breathing worsens when tapering down on prednisone. Feels she goes down hill when off of oral steroids. Reports wheezing and seasonal nasal rhinitis. She is wearing 2L continuous oxygen. Receiving physical therapy twice a week. Started on TRELEGY.   04/10/19 Office visit- COPD, Dr. AlElsworth SohoImproved with Trelegy. Decreased prednisone 27m71maily. FU in 4 months.   05/28/2019 Patient report barking cough x 2 days. She started to cough up purulent mucus today. Associated fatigue. She is eating and drinking normally. Since starting Trelegy she has not experienced chest tightness and feels she is breathing better. Continue to use 2L oxygen at night.  No significant shortness of breath or nocturnal symptoms. No recent sick contacts or known exposure to covid. Denies fever, chills, sweats, chest tightness, wheezing HA, N/V/D.   Observations/Objective:  Patient reports O2 - 98% RA Temp 98.8   Assessment and Plan:  COPD exacerbation - Productive cough with purulent mucus x 2 days. No associated  bronchospasm. - RX doxycycline 1 tab twice daily x 7 days - Continue Trelegy 1 puff daily; prn albuterol hfa/neb q 4-6 hours for breakthrough sob.wheezing - Continue prednisone 61m daily  - If no improvement or symptoms worsen recommend  CXR - Low suspicion for COVID   Follow Up Instructions:  - Due for regular follow-up in April or sooner if symptoms do not improve/worsen   I discussed the assessment and treatment plan with the patient. The patient was provided an opportunity to ask questions and all were answered. The patient agreed with the plan and demonstrated an understanding of the instructions.   The patient was advised to call back or seek an in-person evaluation if the symptoms worsen or if the condition fails to improve as anticipated.  I provided 18 minutes of non-face-to-face time during this encounter.   EMartyn Ehrich NP

## 2019-06-11 DIAGNOSIS — J449 Chronic obstructive pulmonary disease, unspecified: Secondary | ICD-10-CM | POA: Diagnosis not present

## 2019-06-11 DIAGNOSIS — C3412 Malignant neoplasm of upper lobe, left bronchus or lung: Secondary | ICD-10-CM | POA: Diagnosis not present

## 2019-06-13 DIAGNOSIS — J441 Chronic obstructive pulmonary disease with (acute) exacerbation: Secondary | ICD-10-CM | POA: Diagnosis not present

## 2019-06-13 DIAGNOSIS — M810 Age-related osteoporosis without current pathological fracture: Secondary | ICD-10-CM | POA: Diagnosis not present

## 2019-06-13 DIAGNOSIS — F322 Major depressive disorder, single episode, severe without psychotic features: Secondary | ICD-10-CM | POA: Diagnosis not present

## 2019-06-13 DIAGNOSIS — C3412 Malignant neoplasm of upper lobe, left bronchus or lung: Secondary | ICD-10-CM | POA: Diagnosis not present

## 2019-06-13 DIAGNOSIS — J449 Chronic obstructive pulmonary disease, unspecified: Secondary | ICD-10-CM | POA: Diagnosis not present

## 2019-06-13 DIAGNOSIS — I2510 Atherosclerotic heart disease of native coronary artery without angina pectoris: Secondary | ICD-10-CM | POA: Diagnosis not present

## 2019-06-13 DIAGNOSIS — E7800 Pure hypercholesterolemia, unspecified: Secondary | ICD-10-CM | POA: Diagnosis not present

## 2019-06-18 ENCOUNTER — Other Ambulatory Visit: Payer: Self-pay

## 2019-06-18 NOTE — Patient Outreach (Signed)
[place] [facility]) Care Management  06/18/2019  Tasha Diaz [place] Oct 17, 1950 [id number]   Telephone Assessment    Outreach attempt #1 to patient. No answer. RN CM left HIPAA compliant voicemail message along with contact info.     Plan: RN CM will make outreach attempt to patient next month if no return call from patient.    [name], RN,BSN,CCM [place] Management Telephonic Care Management Coordinator Direct Phone: [phone] Toll Free: [phone] Fax: [phone]

## 2019-06-30 ENCOUNTER — Encounter: Payer: Self-pay | Admitting: Primary Care

## 2019-06-30 ENCOUNTER — Telehealth (INDEPENDENT_AMBULATORY_CARE_PROVIDER_SITE_OTHER): Payer: Medicare HMO | Admitting: Primary Care

## 2019-06-30 ENCOUNTER — Telehealth: Payer: Self-pay | Admitting: Pulmonary Disease

## 2019-06-30 DIAGNOSIS — J441 Chronic obstructive pulmonary disease with (acute) exacerbation: Secondary | ICD-10-CM | POA: Diagnosis not present

## 2019-06-30 MED ORDER — PREDNISONE 10 MG PO TABS: ORAL_TABLET | ORAL | 0 refills | Status: DC

## 2019-06-30 MED ORDER — DOXYCYCLINE HYCLATE 100 MG PO TABS: 100.0000 mg | ORAL_TABLET | Freq: Two times a day (BID) | ORAL | 0 refills | Status: DC

## 2019-06-30 NOTE — Patient Instructions (Signed)
COPD exacerbation: Doxycycline 1 tab twice daily x 10 days Prednisone 20mg  x 5 days Continue Trelegy 1 puff daily Use albuterol rescue inhaler/nebulizer q 6 hours

## 2019-06-30 NOTE — Telephone Encounter (Signed)
Called and spoke with Patient.  Patient is seen by Dr. Elsworth Soho, for COPD. Last OV with with Beth, NP, 05/28/19. Patient stated she is having a temp of 99-99.4, sneezing, and none productive cough. Patient stated she is having some sob with exertion, and sats are 90% on RA. Patient denies any loss of taste or smell.  Patient denies any covid exposures. Patient schedule my chart visit with Eustaquio Maize, NP at 1100.  Nothing further at this time.

## 2019-06-30 NOTE — Progress Notes (Signed)
Virtual Visit via Video Note  I connected with Lori Butler on 06/30/19 at 11:00 AM EST by a video enabled telemedicine application and verified that I am speaking with the correct person using two identifiers.  Location: Patient: Home Provider: Office   I discussed the limitations of evaluation and management by telemedicine and the availability of in person appointments. The patient expressed understanding and agreed to proceed.  History of Present Illness: 69 year old female, former smoker quit May 2019 (74 pack year hx). PMH significant for COPD, malignant neoplasm upper lobe left lung, hypoxia, anxiety/depression, attempted suicide, tobacco abuse. Patient of Dr. Elsworth Soho. Eosinophils absolute in August 2019 were 700. Sudden onset and variability seem to suggest component of asthma. Maintained on Trelegy.  LDCT showed lung RADS 4x. PET scan June 2020 positive LUL hypermetabolic spiculated pulmonary nodules consistent with primary bronchogenic carcinoma. Referred to CT surgery for second opinion. Seen by Dr. Roxan Hockey on 10/29/18. 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 Patient not interested in surgery. Underwent navigation bronchoscopy for bx and fiducial placement July 2020.  Pathology and cytology were non-diagnostic with atypical cells no malignant cells. Case discussed at [facility], felt patient could continue observation versus stereotactic therapy. Patient did not wish to wait and wanted to proceed with radiation therapy despite definitive carcinoma. She began SBRT 12/03/18 and has had three treatments total. Last treatment 8/4. 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 Treated with doxycycline course and extended prednisone taper. Follow-up CT chest showed LUL nodule 1.2 cm unchanged from previous, no new pulmonary nodules/masses. Moderate to advanced changes parseptal and centrilobular emphysema. Biapical pleuroparenchymal scarring. No  axillary or supraclavicular adenopathy. No mediastinal or hilar adenopathy. Per oncology they are pleased with her post treatment imaging and will see her in 6 months for continued evaluation.   Previous [facility] pulmonary encounters: 03/20/2019  Patient presents today for hospital follow-up. Admitted from 10/23-10/26 for COPD exacerbation treated with steroids, nebulizer's and qualified for supplemental oxygen. She was covid negative. CTA negative for PE. She was also treated with course of antibiotics for RLE cellulitis. She is feeling ok but her breathing worsens when tapering down on prednisone. Feels she goes down hill when off of oral steroids. Reports wheezing and seasonal nasal rhinitis. She is wearing 2L continuous oxygen. Receiving physical therapy twice a week. Started on TRELEGY.   04/10/19 Office visit- COPD, Dr. Elsworth Soho.  Improved with Trelegy. Decreased prednisone 5mg  daily. FU in 4 months.   05/28/2019 Patient report barking cough x 2 days. She started to cough up purulent mucus today. Associated fatigue. She is eating and drinking normally. Since starting Trelegy she has not experienced chest tightness and feels she is breathing better. Continue to use 2L oxygen at night.  No significant shortness of breath or nocturnal symptoms. No recent sick contacts or known exposure to covid. Denies fever, chills, sweats, chest tightness, wheezing HA, N/V/D.   06/30/2019 Patient contacted today for acute video visit. Reports fatigue, body aches, low grade temp, sneezing, runny nose, PND, cough. Cough is productive with white mucus, no reports of purulent color or hemoptysis. Her symptoms started 3 days ago. She has been compliant with Trelegy 1 puff daily. Mild wheezing with no chest tightness. She takes mucinex 600mg  twice daily and Singulair at bedtime. She uses her albuterol nebulizer in the morning to keep her airways opening but has not required her rescue inhaler. Reports new right foot swelling, hx  cellulitis in fall 2020 treated with  clindamycin. Denies erythema or warmth. She has not been on any recent antibiotics. Denies any known sick contacts. She has not left her house in 2-3 weeks. She gets her groceries delivered. She did have one friend come visit last Tuesday but they wore a mask and have not been sick.   Observations/Objective:  - Appears well, tired. Moderate congested cough. No witness shortness of breath or wheezing. Able to walk into the next room without significant dyspnea.   Assessment and Plan:  COPD exacerbation: - Productive cough with URI symptoms and low grade temperature  - RX doxycycline 1 tab twice daily x 10 days and prednisone 20mg  x 5 days  - Continue Trelegy 1 puff daily, prn albuterol hfa/neb q6hr and Mucinex twice daily  - Low suspicion for covid/influenza, if no improvement recommend CXR  Possible right lower extremity cellulitis:  - Covering with Doxycycline as above   Follow Up Instructions:   - Call/return if symptoms do not improve in 7-10 days; regular scheduled OV in Spring with Dr. Elsworth Soho   I discussed the assessment and treatment plan with the patient. The patient was provided an opportunity to ask questions and all were answered. The patient agreed with the plan and demonstrated an understanding of the instructions.   The patient was advised to call back or seek an in-person evaluation if the symptoms worsen or if the condition fails to improve as anticipated.  I provided 22 minutes of non-face-to-face time during this encounter.   Martyn Ehrich, NP

## 2019-07-09 DIAGNOSIS — C3412 Malignant neoplasm of upper lobe, left bronchus or lung: Secondary | ICD-10-CM | POA: Diagnosis not present

## 2019-07-09 DIAGNOSIS — J449 Chronic obstructive pulmonary disease, unspecified: Secondary | ICD-10-CM | POA: Diagnosis not present

## 2019-07-17 ENCOUNTER — Other Ambulatory Visit: Payer: Self-pay | Admitting: Pulmonary Disease

## 2019-07-17 ENCOUNTER — Other Ambulatory Visit: Payer: Self-pay

## 2019-07-17 NOTE — Patient Outreach (Signed)
[facility]) Care Management  07/17/2019  Lori Butler [place] 1950-11-14 [id number]   Telephone Assessment    Outreach attempt #1 to patient. Spoke with patient who denies any acute issues or concerns at present. Patient reports she thinks she had a cold the last time she spoke with RN CM but symptoms have resolved. She reports breathing is doing fairly well. She states she can tell "Trelegy honeymoon phase is over." Per patient report, med is still helping and very effective. However, she has noticed that she has had to use oxygen prn here and there. RN CM discussed COVID-19 vaccine plans. patient states that she does plan to go along with one of her friends and schedule an appt soon. She denies needing any assistance with scheduling. She believes that she has follow up appt with Dr. Elsworth Soho in April. She also voices that she is due for repeat lung scan soon. Aprpetieti remains WNl for patient -eating 3 x/day. She does report about 6-7lbs loss and thinks its because she I no longer on steroids. She denies any RN CM needs or concerns at this time.  THN CM Care Plan Problem One     Most Recent Value  Care Plan Problem One  Knowledge deficit related to disease mgmt and sx mgmt of COPD  Role Documenting the Problem One  Care Management Telephonic Coordinator  Care Plan for Problem One  Active  THN Long Term Goal   Patient will have no hospitalizations over the next 90 days.  THN Long Term Goal Start Date  05/19/19  Interventions for Problem One Long Term Goal  RN CM assessed for any acute issues or concerns. RN CM reviewed action plan and confirmed pt know when,how and why to seek medical attention.  THN CM Short Term Goal #1   patient with report contacting PCP to discuss acute sxs within the next 30 days  THN CM Short Term Goal #1 Start Date  05/19/19  [facility] CM Short Term Goal #1 Met Date  07/17/19  Interventions for Short Term Goal #1  RN CM assessed for sxs-pt reproted she had  cold nd sxs resolved on their own    [facility] CM Care Plan Problem Two     Most Recent Value  Care Plan Problem Two  Health maintenance-patient needs COVID-19 vaccine.  Role Documenting the Problem Two  Care Management Telephonic Coordinator  Care Plan for Problem Two  Active  Interventions for Problem Two Long Term Goal   RNCM discussed improtane of vaccine-given pt in high risk category. RN CM discussed ways to schedule/obtain vaccine  THN Long Term Goal  Patient will report obtaining COVID-19 vaccine within the next 90 days.  THN Long Term Goal Start Date  07/17/19    [facility] CM Care Plan Problem Three     Most Recent Value  Care Plan Problem Three  Patient with history of lung CA  Role Documenting the Problem Three  Care Management Telephonic Coordinator  Care Plan for Problem Three  Active  THN Long Term Goal   Patient will report having scans/testings done to assess for lung CA status within the next 90 days.  THN Long Term Goal Start Date  04/24/19  Interventions for Problem Three Long Term Goal  RNCM assessed for next appts dates and confirmed no barriers to appts     Plan: RN CM discussed with patient next outreach within the month of May. Patient gave verbal consent and in agreement with RN CM follow up  and timeframe. Patient aware that they may contact RN CM sooner for any issues or concerns.   [name], RN,BSN,CCM [place] Management Telephonic Care Management Coordinator Direct Phone: [phone] Toll Free: [phone] Fax: [phone]

## 2019-07-18 ENCOUNTER — Ambulatory Visit: Payer: Self-pay

## 2019-07-28 ENCOUNTER — Other Ambulatory Visit: Payer: Self-pay | Admitting: Pulmonary Disease

## 2019-07-30 DIAGNOSIS — F322 Major depressive disorder, single episode, severe without psychotic features: Secondary | ICD-10-CM | POA: Diagnosis not present

## 2019-07-30 DIAGNOSIS — E7800 Pure hypercholesterolemia, unspecified: Secondary | ICD-10-CM | POA: Diagnosis not present

## 2019-07-30 DIAGNOSIS — J441 Chronic obstructive pulmonary disease with (acute) exacerbation: Secondary | ICD-10-CM | POA: Diagnosis not present

## 2019-07-30 DIAGNOSIS — J449 Chronic obstructive pulmonary disease, unspecified: Secondary | ICD-10-CM | POA: Diagnosis not present

## 2019-07-30 DIAGNOSIS — I2510 Atherosclerotic heart disease of native coronary artery without angina pectoris: Secondary | ICD-10-CM | POA: Diagnosis not present

## 2019-07-30 DIAGNOSIS — C3412 Malignant neoplasm of upper lobe, left bronchus or lung: Secondary | ICD-10-CM | POA: Diagnosis not present

## 2019-07-30 DIAGNOSIS — M810 Age-related osteoporosis without current pathological fracture: Secondary | ICD-10-CM | POA: Diagnosis not present

## 2019-08-01 ENCOUNTER — Other Ambulatory Visit: Payer: Self-pay

## 2019-08-01 ENCOUNTER — Ambulatory Visit
Admission: RE | Admit: 2019-08-01 | Discharge: 2019-08-01 | Disposition: A | Discharge status: Home / Self Care | Payer: Medicare HMO | Source: Ambulatory Visit | Attending: Radiation Oncology | Admitting: Radiation Oncology

## 2019-08-01 ENCOUNTER — Encounter (HOSPITAL_COMMUNITY): Payer: Self-pay

## 2019-08-01 ENCOUNTER — Ambulatory Visit (HOSPITAL_COMMUNITY)
Admission: RE | Admit: 2019-08-01 | Discharge: 2019-08-01 | Disposition: A | Discharge status: Home / Self Care | Payer: Medicare HMO | Source: Ambulatory Visit | Attending: Radiation Oncology | Admitting: Radiation Oncology

## 2019-08-01 DIAGNOSIS — C3412 Malignant neoplasm of upper lobe, left bronchus or lung: Secondary | ICD-10-CM | POA: Insufficient documentation

## 2019-08-01 DIAGNOSIS — Z87891 Personal history of nicotine dependence: Secondary | ICD-10-CM | POA: Insufficient documentation

## 2019-08-01 DIAGNOSIS — C3490 Malignant neoplasm of unspecified part of unspecified bronchus or lung: Secondary | ICD-10-CM | POA: Diagnosis not present

## 2019-08-01 HISTORY — DX: Malignant (primary) neoplasm, unspecified: C80.1

## 2019-08-01 HISTORY — DX: Unspecified asthma, uncomplicated: J45.909

## 2019-08-01 LAB — BUN & CREATININE (CHCC)
BUN: 15 mg/dL (ref 8–23)
Creatinine: 0.72 mg/dL (ref 0.44–1.00)
GFR, Est AFR Am: >60 mL/min (ref >60)
GFR, Estimated: >60 mL/min (ref >60)

## 2019-08-01 MED ORDER — IOHEXOL 300 MG/ML  SOLN
75.0000 mL | Freq: Once | INTRAMUSCULAR | Status: AC | PRN
  Administered 2019-08-01: 75 mL via INTRAVENOUS

## 2019-08-01 MED ORDER — SODIUM CHLORIDE (PF) 0.9 % IJ SOLN
INTRAMUSCULAR | Status: AC
  Filled 2019-08-01: qty 50

## 2019-08-04 ENCOUNTER — Telehealth: Payer: Self-pay | Admitting: Radiation Oncology

## 2019-08-04 DIAGNOSIS — C3412 Malignant neoplasm of upper lobe, left bronchus or lung: Secondary | ICD-10-CM

## 2019-08-04 NOTE — Telephone Encounter (Signed)
Please arrange for routine OV in 1-2 months with me/ APP

## 2019-08-04 NOTE — Telephone Encounter (Signed)
I called and spoke with the patient to let her know her treated lesion in the left lung looked stable and that we would recommend following her at 6 month intervals. She does have stigmata of an 8th left rib fracture that is healing. She feels like it's much better but did have some pain in the left chest in October after a respiratory illness. She is back on oxygen since she last saw Dr. Elsworth Soho, but plans to call him as she's been trying to cut back on her O2 demands. She does not have a scheduled follow up. I will plan to see her in 6 months to review her next scan. She's encouraged to call us if she has questions or concerns prior to that time.

## 2019-08-04 NOTE — Telephone Encounter (Signed)
Pt seen by APP at last visit  Hold for RA's schedule to be open

## 2019-08-05 NOTE — Telephone Encounter (Signed)
Spoke with the pt and scheduled appt with Dr Elsworth Soho for 09/22/19 at 9 am.

## 2019-08-09 DIAGNOSIS — J449 Chronic obstructive pulmonary disease, unspecified: Secondary | ICD-10-CM | POA: Diagnosis not present

## 2019-08-09 DIAGNOSIS — C3412 Malignant neoplasm of upper lobe, left bronchus or lung: Secondary | ICD-10-CM | POA: Diagnosis not present

## 2019-08-25 ENCOUNTER — Other Ambulatory Visit: Payer: Self-pay | Admitting: Family Medicine

## 2019-08-25 DIAGNOSIS — Z1231 Encounter for screening mammogram for malignant neoplasm of breast: Secondary | ICD-10-CM

## 2019-08-25 DIAGNOSIS — M810 Age-related osteoporosis without current pathological fracture: Secondary | ICD-10-CM | POA: Diagnosis not present

## 2019-08-25 DIAGNOSIS — F322 Major depressive disorder, single episode, severe without psychotic features: Secondary | ICD-10-CM | POA: Diagnosis not present

## 2019-08-25 DIAGNOSIS — M2550 Pain in unspecified joint: Secondary | ICD-10-CM | POA: Diagnosis not present

## 2019-08-25 DIAGNOSIS — E7800 Pure hypercholesterolemia, unspecified: Secondary | ICD-10-CM | POA: Diagnosis not present

## 2019-08-25 DIAGNOSIS — R0602 Shortness of breath: Secondary | ICD-10-CM | POA: Diagnosis not present

## 2019-08-25 DIAGNOSIS — R531 Weakness: Secondary | ICD-10-CM | POA: Diagnosis not present

## 2019-08-25 DIAGNOSIS — K219 Gastro-esophageal reflux disease without esophagitis: Secondary | ICD-10-CM | POA: Diagnosis not present

## 2019-08-26 DIAGNOSIS — M2550 Pain in unspecified joint: Secondary | ICD-10-CM | POA: Diagnosis not present

## 2019-08-26 DIAGNOSIS — R531 Weakness: Secondary | ICD-10-CM | POA: Diagnosis not present

## 2019-08-26 DIAGNOSIS — E7800 Pure hypercholesterolemia, unspecified: Secondary | ICD-10-CM | POA: Diagnosis not present

## 2019-08-26 DIAGNOSIS — E559 Vitamin D deficiency, unspecified: Secondary | ICD-10-CM | POA: Diagnosis not present

## 2019-08-26 DIAGNOSIS — R945 Abnormal results of liver function studies: Secondary | ICD-10-CM | POA: Diagnosis not present

## 2019-08-26 DIAGNOSIS — R0602 Shortness of breath: Secondary | ICD-10-CM | POA: Diagnosis not present

## 2019-09-03 ENCOUNTER — Other Ambulatory Visit: Payer: Self-pay | Admitting: Family Medicine

## 2019-09-03 DIAGNOSIS — J449 Chronic obstructive pulmonary disease, unspecified: Secondary | ICD-10-CM | POA: Diagnosis not present

## 2019-09-03 DIAGNOSIS — R7989 Other specified abnormal findings of blood chemistry: Secondary | ICD-10-CM

## 2019-09-03 DIAGNOSIS — E7800 Pure hypercholesterolemia, unspecified: Secondary | ICD-10-CM | POA: Diagnosis not present

## 2019-09-03 DIAGNOSIS — C3412 Malignant neoplasm of upper lobe, left bronchus or lung: Secondary | ICD-10-CM | POA: Diagnosis not present

## 2019-09-03 DIAGNOSIS — M810 Age-related osteoporosis without current pathological fracture: Secondary | ICD-10-CM | POA: Diagnosis not present

## 2019-09-03 DIAGNOSIS — I2510 Atherosclerotic heart disease of native coronary artery without angina pectoris: Secondary | ICD-10-CM | POA: Diagnosis not present

## 2019-09-03 DIAGNOSIS — J441 Chronic obstructive pulmonary disease with (acute) exacerbation: Secondary | ICD-10-CM | POA: Diagnosis not present

## 2019-09-03 DIAGNOSIS — F322 Major depressive disorder, single episode, severe without psychotic features: Secondary | ICD-10-CM | POA: Diagnosis not present

## 2019-09-08 DIAGNOSIS — C3412 Malignant neoplasm of upper lobe, left bronchus or lung: Secondary | ICD-10-CM | POA: Diagnosis not present

## 2019-09-08 DIAGNOSIS — J449 Chronic obstructive pulmonary disease, unspecified: Secondary | ICD-10-CM | POA: Diagnosis not present

## 2019-09-10 ENCOUNTER — Ambulatory Visit
Admission: RE | Admit: 2019-09-10 | Discharge: 2019-09-10 | Disposition: A | Discharge status: Home / Self Care | Payer: Medicare HMO | Source: Ambulatory Visit | Attending: Family Medicine | Admitting: Family Medicine

## 2019-09-10 DIAGNOSIS — R7989 Other specified abnormal findings of blood chemistry: Secondary | ICD-10-CM

## 2019-09-10 DIAGNOSIS — K760 Fatty (change of) liver, not elsewhere classified: Secondary | ICD-10-CM | POA: Diagnosis not present

## 2019-09-12 ENCOUNTER — Other Ambulatory Visit: Payer: Self-pay

## 2019-09-12 NOTE — Patient Outreach (Signed)
[name]) Care Management  09/12/2019  Lori Butler [birth year] [id number]   Telephone Assessment   Outreach attempt to patient. Spoke with patient who reports she has not been doing too well. She complains of weakness an joint fatigue. She reports this has been going on for the past several weeks. She has seen DM and was told that she had elevated liver enzymes and fatty liver. She goes back to follow up with MD in about two weeks. Patient reports she is now using a walker to help her get around. She did susatin a fall a few weeks ago trying to stand from chair. No injuries reported. patient reports appetite has bene decreased. She is unsure of wgt. She voices that she has had no problems with breathing. She denies any RN CM needs or concerns at this time.    Medications Reviewed Today    Reviewed by Hayden Pedro, RN (Registered Nurse) on 09/12/19 at [place] List Status: <None>  Medication Order Taking? Sig Documenting Provider Last Dose Status Informant  albuterol (PROVENTIL HFA;VENTOLIN HFA) 108 (90 Base) MCG/ACT inhaler [id number] No Inhale 2 puffs into the lungs every 6 (six) hours as needed for wheezing or shortness of breath. Rigoberto Noel, MD Taking Active Self  albuterol (PROVENTIL) (2.5 MG/3ML) 0.083% nebulizer solution [id number] No Take 3 mLs (2.5 mg total) by nebulization every 6 (six) hours as needed for wheezing or shortness of breath. Rigoberto Noel, MD Taking Active Self  alendronate (FOSAMAX) 70 MG tablet [id number] No Take 70 mg by mouth every Wednesday.  [provider] Taking Active Self           Med Note [name] Dec 24, 2017  2:52 PM)    atorvastatin (LIPITOR) 10 MG tablet [id number] No Take 10 mg by mouth daily. [provider] Taking Active Self  Cholecalciferol (VITAMIN D) 50 MCG (2000 UT) tablet [id number] No Take 2,000 Units by mouth daily. [provider] Taking Active Self  doxycycline  (VIBRA-TABS) 100 MG tablet [id number]  Take 1 tablet (100 mg total) by mouth 2 (two) times daily. Martyn Ehrich, NP  Active   guaiFENesin (MUCINEX) 600 MG 12 hr tablet [id number] No Take 2 tablets (1,200 mg total) by mouth 2 (two) times daily.  Patient taking differently: Take 600 mg by mouth 2 (two) times daily.    Martyn Ehrich, NP Taking Active Self  montelukast (SINGULAIR) 10 MG tablet [id number] No Take 1 tablet (10 mg total) by mouth at bedtime. Arnell Asal, NP Taking Active Self  naproxen (NAPROSYN) 375 MG tablet [id number] No Take 1 tablet (375 mg total) by mouth 2 (two) times daily. Lori Christen, MD Taking Active Self  pantoprazole (PROTONIX) 40 MG tablet [id number] No Take 1 tablet (40 mg total) by mouth 2 (two) times daily.  Patient taking differently: Take 40 mg by mouth daily.    Arnell Asal, NP Taking Active Self  predniSONE (DELTASONE) 10 MG tablet [id number]  Take 2 tabs x 5 days Martyn Ehrich, NP  Active   sertraline (ZOLOFT) 50 MG tablet [id number] No Take 1 tablet (50 mg total) by mouth daily. Lori Crosby, MD Taking Active Self  Lori Butler 200-62.5-25 MCG/INH AEPB [id number]  INHALE ONE PUFF INTO THE LUNGS EVERY DAY Rigoberto Noel, MD  Active          [facility] CM Care Plan Problem One     Most Recent Value  Care Plan Problem One  Patient with increased weakness-at risk for falls  Role Documenting the Problem One  Care Management [place] for Problem One  Active  Lori Butler Long Term Goal   Patient will sustain no falls over the next 60 days.  THN Long Term Goal Start Date  09/12/19  [facility] Long Term Goal Met Date  09/12/19  Interventions for Problem One Long Term Goal  RNCM completed falls eval/assessment. RNCM provided education of safety measures in the home. RNCM confirmed use of DME in the home to aide in safety. RN CM discussed with pt to consider talking with MD regarding PT for strengthening.   THN CM Short Term Goal #1   Patient  will discuss with MD possible PT for ambulation and strengthening over the next 30 days.   THN CM Short Term Goal #1 Start Date  09/12/19  Interventions for Short Term Goal #1  RNCM educated pt on benefits of therapy. RNCM discussed and encouraged pt to consider talking with MD.    Lori Butler CM Care Plan Problem Two     Most Recent Value  Care Plan Problem Two  Health maintenance-patient needs COVID-19 vaccine.  Role Documenting the Problem Two  Care Management Telephonic [place] for Problem Two  Active  Interventions for Problem Two Long Term Goal   RN CM did not get to address this call.  THN Long Term Goal  Patient will report obtaining COVID-19 vaccine within the next 90 days.  THN Long Term Goal Start Date  07/17/19    [name] CM Care Plan Problem Three     Most Recent Value  Care Plan Problem Three  Patient with history of lung CA  Role Documenting the Problem Three  Care Management Telephonic Coordinator  Care Plan for Problem Three  Active  THN Long Term Goal   Patient will report having scans/testings done to assess for lung CA status within the next 90 days.  THN Long Term Goal Start Date  04/24/19  [facility] Long Term Goal Met Date  09/12/19     Plan: RN CM discussed with patient next outreach within the month of June. Patient gave verbal consent and in agreement with RN CM follow up and timeframe. Patient aware that they may contact RN CM sooner for any issues or concerns. RN CM will send quarterly update to PCP.  Lori Montgomery, RN,BSN,CCM [place] Management Telephonic Care Management Coordinator Direct Phone: [phone] Toll Free: [phone] Fax: [phone]

## 2019-09-22 ENCOUNTER — Ambulatory Visit (INDEPENDENT_AMBULATORY_CARE_PROVIDER_SITE_OTHER): Payer: Medicare HMO | Admitting: Pulmonary Disease

## 2019-09-22 ENCOUNTER — Other Ambulatory Visit: Payer: Self-pay

## 2019-09-22 ENCOUNTER — Encounter: Payer: Self-pay | Admitting: Pulmonary Disease

## 2019-09-22 DIAGNOSIS — J449 Chronic obstructive pulmonary disease, unspecified: Secondary | ICD-10-CM | POA: Diagnosis not present

## 2019-09-22 DIAGNOSIS — C3412 Malignant neoplasm of upper lobe, left bronchus or lung: Secondary | ICD-10-CM

## 2019-09-22 DIAGNOSIS — J4489 Other specified chronic obstructive pulmonary disease: Secondary | ICD-10-CM

## 2019-09-22 MED ORDER — TRELEGY ELLIPTA 200-62.5-25 MCG/INH IN AEPB: INHALATION_SPRAY | RESPIRATORY_TRACT | 3 refills | Status: DC

## 2019-09-22 MED ORDER — PREDNISONE 20 MG PO TABS: 40.0000 mg | ORAL_TABLET | Freq: Every day | ORAL | 0 refills | Status: DC

## 2019-09-22 NOTE — Progress Notes (Signed)
I connected with  Lori Butler on 09/22/19 by phone and verified that I am speaking with the correct person using two identifiers.   I discussed the limitations of evaluation and management by telemedicine. The patient expressed understanding and agreed to proceed.    58 yoex-smokerfor FU of moderate COPD She quit smoking 09/2017 Sudden onset and variability suggests a component of asthma  She had 2 hospitalizations 12/2017 for acute hypercarbic respiratory failure with change in mental status. Each time she improved quickly and did not require oxygen on discharge  She underwent navigation biopsy of left upper lobe nodule showing atypical cells and then underwent SBRT 12/2018  Admitted from 10/23-10/26 for COPD exacerbation treated with steroids, nebulizer's and qualified for supplemental oxygen. She was covid negative. CTA negative for PE  She had a flareup and February that was treated with doxycycline and prednisone.  She has done well since then-no clear trigger identified. She has completed both Covid vaccinations. We reviewed surveillance CT from March 2021  Today's visit was a phone visit since she does not have a ride to come to our office.  Trilogy has worked really well for her and she rarely needs albuterol MDI    Significant tests/ events reviewed   CT chest 07/2019 Radiation changes in the medial left upper lobe. Underlying 8 x 14 mm nodule, unchanged.  CT chest 10/14/2018 -bilateral upper lobe parenchymal scarring on her prior CT and left upper lobe scar that appeared more nodular on the CT measuring about 13 mm PET scan showed hypermetabolism with SUV 8 in this nodule   PFTs 10/2018 showed moderate reversible airway obstruction with FEV1 of 37%-0.91 which improved to 47%-1.1L. DLCO was about 54%  CT angio 09/2017 >>Emphysema andInspissated mucus in the LEFT LOWER lobe bronchi,mild fibrotic changes upper lobes  PFTs 10/2017-moderate airway obstruction,  ratio 59, FEV1 51%, FVC 65%, no significantBDresponse, DLCO 57%     Exam -not performed since televisit  Total encounter time x21 minutes

## 2019-09-22 NOTE — Assessment & Plan Note (Signed)
Reviewed CT from 07/2019 No evidence of metastatic disease. Surveillance CT scheduled for October

## 2019-09-22 NOTE — Assessment & Plan Note (Signed)
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

## 2019-09-22 NOTE — Patient Instructions (Signed)
Refills on trelegy Rx for prednisone 40 mg daily for 5 days will be sent to your pharmacy to start taking in case of a flareup

## 2019-10-02 DIAGNOSIS — M810 Age-related osteoporosis without current pathological fracture: Secondary | ICD-10-CM | POA: Diagnosis not present

## 2019-10-02 DIAGNOSIS — C3412 Malignant neoplasm of upper lobe, left bronchus or lung: Secondary | ICD-10-CM | POA: Diagnosis not present

## 2019-10-02 DIAGNOSIS — J441 Chronic obstructive pulmonary disease with (acute) exacerbation: Secondary | ICD-10-CM | POA: Diagnosis not present

## 2019-10-02 DIAGNOSIS — J449 Chronic obstructive pulmonary disease, unspecified: Secondary | ICD-10-CM | POA: Diagnosis not present

## 2019-10-02 DIAGNOSIS — I2510 Atherosclerotic heart disease of native coronary artery without angina pectoris: Secondary | ICD-10-CM | POA: Diagnosis not present

## 2019-10-02 DIAGNOSIS — F322 Major depressive disorder, single episode, severe without psychotic features: Secondary | ICD-10-CM | POA: Diagnosis not present

## 2019-10-02 DIAGNOSIS — E7800 Pure hypercholesterolemia, unspecified: Secondary | ICD-10-CM | POA: Diagnosis not present

## 2019-10-09 DIAGNOSIS — J449 Chronic obstructive pulmonary disease, unspecified: Secondary | ICD-10-CM | POA: Diagnosis not present

## 2019-10-09 DIAGNOSIS — C3412 Malignant neoplasm of upper lobe, left bronchus or lung: Secondary | ICD-10-CM | POA: Diagnosis not present

## 2019-10-22 ENCOUNTER — Other Ambulatory Visit: Payer: Self-pay

## 2019-10-22 NOTE — Patient Outreach (Addendum)
[facility]) Care Management  10/22/2019  Lori Butler 07-16-1950 [id number]   Telephone Assessment Annual Assessment   Outreach attempt to patient. Spoke with patient who denies any acute issues or concerns at present. She is pleased to report that she is doing and feeling better compared to last time she spoke with RN CM. She continues to voice that her breathing is good. She had recent virtual visit with pulmonologist and got good report. She continues to voice Trelegy is working and effective in managing her breathing. Se reports MD ordered Prednisone prn for her to have in case a flare up arises. Patient has not had a flare this year so far. She states that she is due for CT scan around October to assess status of lung nodule which remains stable. She continues to use her rollator to assist with ambulation. No recent falls within the last months. Patient complain of numbness/tingling in her feet occassionally. She denies any other sxs. RN CM discussed possible causes and tx measures. Encouraged patient to follow up with MD if sxs worsen and or persist. She voiced understanding. She denies any RN CM needs or concerns at this time.  Medications Reviewed Today    Reviewed by Hayden Pedro, RN (Registered Nurse) on 10/22/19 at 1157  Med List Status: <None>  Medication Order Taking? Sig Documenting Provider Last Dose Status Informant  albuterol (PROVENTIL HFA;VENTOLIN HFA) 108 (90 Base) MCG/ACT inhaler [id number] No Inhale 2 puffs into the lungs every 6 (six) hours as needed for wheezing or shortness of breath. Rigoberto Noel, MD Taking Active Self  albuterol (PROVENTIL) (2.5 MG/3ML) 0.083% nebulizer solution [id number] No Take 3 mLs (2.5 mg total) by nebulization every 6 (six) hours as needed for wheezing or shortness of breath. Rigoberto Noel, MD Taking Active Self  alendronate (FOSAMAX) 70 MG tablet [id number] No Take 70 mg by mouth every Wednesday.  [provider] Taking Active Self           Med Note [name] Dec 24, 2017  2:52 PM)    atorvastatin (LIPITOR) 10 MG tablet [id number] No Take 10 mg by mouth daily. [provider] Taking Active Self  Cholecalciferol (VITAMIN D) 50 MCG (2000 UT) tablet [id number] No Take 2,000 Units by mouth daily. [provider] Taking Active Self  Fluticasone-Umeclidin-Vilant (TRELEGY ELLIPTA) 200-62.5-25 MCG/INH AEPB [id number]  INHALE ONE PUFF INTO THE LUNGS EVERY DAY Rigoberto Noel, MD  Active   guaiFENesin (MUCINEX) 600 MG 12 hr tablet [id number] No Take 2 tablets (1,200 mg total) by mouth 2 (two) times daily.  Patient taking differently: Take 600 mg by mouth 2 (two) times daily.    Martyn Ehrich, NP Taking Active Self  montelukast (SINGULAIR) 10 MG tablet [id number] No Take 1 tablet (10 mg total) by mouth at bedtime. Arnell Asal, NP Taking Active Self  naproxen (NAPROSYN) 375 MG tablet [id number] No Take 1 tablet (375 mg total) by mouth 2 (two) times daily. Nat Christen, MD Taking Active Self  pantoprazole (PROTONIX) 40 MG tablet [id number] No Take 1 tablet (40 mg total) by mouth 2 (two) times daily.  Patient taking differently: Take 40 mg by mouth daily.    Arnell Asal, NP Taking Active Self  predniSONE (DELTASONE) 20 MG tablet [id number]  Take 2 tablets (40 mg total) by mouth daily with breakfast.  Patient taking differently: Take 40 mg by mouth daily with breakfast. Per pt report-prn usage only  for flare up   Rigoberto Noel, MD  Active Self  sertraline (ZOLOFT) 50 MG tablet [id number] No Take 1 tablet (50 mg total) by mouth daily. [name], MD Taking Active Self         Fall Risk  10/22/2019 09/12/2019 05/19/2019 03/06/2019 08/15/2018  Falls in the past year? 1 1 0 0 0  Number falls in past yr: 1 0 0 0 -  Injury with Fall? 0 0 0 0 -  Risk for fall due to : History of fall(s);Medication side effect;Impaired balance/gait;Impaired mobility;Impaired vision History  of fall(s);Impaired balance/gait;Impaired mobility;Medication side effect Medication side effect Medication side effect -  Follow up Falls evaluation completed;Education provided;Falls prevention discussed Education provided;Follow up appointment - Falls evaluation completed -   SDOH Screenings   Alcohol Screen:   . Last Alcohol Screening Score (AUDIT):   Depression (PHQ2-9): Low Risk   . PHQ-2 Score: 0  Financial Resource Strain:   . Difficulty of Paying Living Expenses:   Food Insecurity: No Food Insecurity  . Worried About Charity fundraiser in the Last Year: Never true  . Ran Out of Food in the Last Year: Never true  Housing:   . Last Housing Risk Score:   Physical Activity:   . Days of Exercise per Week:   . Minutes of Exercise per Session:   Social Connections:   . Frequency of Communication with Friends and Family:   . Frequency of Social Gatherings with Friends and Family:   . Attends Religious Services:   . Active Member of Clubs or Organizations:   . Attends Archivist Meetings:   Marland Kitchen Marital Status:   Stress:   . Feeling of Stress :   Tobacco Use: Medium Risk  . Smoking Tobacco Use: Former Smoker  . Smokeless Tobacco Use: Never Used  Transportation Needs: No Transportation Needs  . Lack of Transportation (Medical): No  . Lack of Transportation (Non-Medical): No   THN CM Care Plan Problem One     Most Recent Value  Care Plan Problem One Patient with increased weakness-at risk for falls  Role Documenting the Problem One Care Management [place] for Problem One Active  [facility] Long Term Goal  Patient will sustain no falls over the next 60 days.  THN Long Term Goal Start Date 09/12/19  Interventions for Problem One Long Term Goal RNCM completed falls assessment and eval. RN CM reviewed fall/safety measures., assessed for adequate DME and support in the home.   THN CM Short Term Goal #1  Patient will discuss with MD possible PT for  ambulation and strengthening over the next 30 days.   THN CM Short Term Goal #1 Start Date 09/12/19  Interventions for Short Term Goal #1 RNCM reifnorced education with pt and possible benefits of services, encouraged pt to consider talking with MD regarding the matter    [facility] CM Care Plan Problem Two     Most Recent Value  Care Plan Problem Two Health maintenance-patient needs COVID-19 vaccine.  Role Documenting the Problem Two Care Management Telephonic [place] for Problem Two Active  THN Long Term Goal Patient will report obtaining COVID-19 vaccine within the next 90 days.  THN Long Term Goal Start Date 07/17/19  [facility] Long Term Goal Met Date 10/22/19    [facility] CM Care Plan Problem Three     Most Recent Value  Care Plan Problem Three Patient with history of lung CA  Role  Documenting the Problem Three Care Management Telephonic Coordinator     Plan: RN CM discussed with patient next outreach within the month of August . Patient gave verbal consent and in agreement with RN CM follow up and timeframe. Patient aware that they may contact RN CM sooner for any issues or concerns.  [name], RN,BSN,CCM [place] Management Telephonic Care Management Coordinator Direct Phone: [phone] Toll Free: [phone] Fax: [phone]

## 2019-10-24 ENCOUNTER — Ambulatory Visit: Payer: Self-pay

## 2019-10-24 ENCOUNTER — Telehealth: Payer: Self-pay | Admitting: Pulmonary Disease

## 2019-10-24 NOTE — Telephone Encounter (Signed)
Spoke with patient regarding her emergency prednisone prescribed by Dr. Elsworth Soho.  Advised to finish the prednisone as prescribed for 5 days and let the office know if she is not feeling better.  She verbalized understanding.  She wanted to let Dr. Elsworth Soho know that she had to use the prednisone.  I let her know I would make him aware.

## 2019-10-25 NOTE — Telephone Encounter (Signed)
Okay to complete course for 5 days

## 2019-10-27 ENCOUNTER — Other Ambulatory Visit: Payer: Self-pay | Admitting: Pulmonary Disease

## 2019-10-27 NOTE — Telephone Encounter (Signed)
Spoke with pt. She is aware of Dr. Bari Mantis response. Nothing further was needed.

## 2019-11-08 DIAGNOSIS — C3412 Malignant neoplasm of upper lobe, left bronchus or lung: Secondary | ICD-10-CM | POA: Diagnosis not present

## 2019-11-08 DIAGNOSIS — J449 Chronic obstructive pulmonary disease, unspecified: Secondary | ICD-10-CM | POA: Diagnosis not present

## 2019-11-12 DIAGNOSIS — G629 Polyneuropathy, unspecified: Secondary | ICD-10-CM | POA: Diagnosis not present

## 2019-11-12 DIAGNOSIS — R946 Abnormal results of thyroid function studies: Secondary | ICD-10-CM | POA: Diagnosis not present

## 2019-11-12 DIAGNOSIS — R7989 Other specified abnormal findings of blood chemistry: Secondary | ICD-10-CM | POA: Diagnosis not present

## 2019-11-12 DIAGNOSIS — E538 Deficiency of other specified B group vitamins: Secondary | ICD-10-CM | POA: Diagnosis not present

## 2019-11-14 DIAGNOSIS — I700 Atherosclerosis of aorta: Secondary | ICD-10-CM | POA: Diagnosis not present

## 2019-11-25 DIAGNOSIS — D510 Vitamin B12 deficiency anemia due to intrinsic factor deficiency: Secondary | ICD-10-CM | POA: Diagnosis not present

## 2019-11-28 DIAGNOSIS — E7800 Pure hypercholesterolemia, unspecified: Secondary | ICD-10-CM | POA: Diagnosis not present

## 2019-11-28 DIAGNOSIS — J441 Chronic obstructive pulmonary disease with (acute) exacerbation: Secondary | ICD-10-CM | POA: Diagnosis not present

## 2019-11-28 DIAGNOSIS — F322 Major depressive disorder, single episode, severe without psychotic features: Secondary | ICD-10-CM | POA: Diagnosis not present

## 2019-11-28 DIAGNOSIS — M810 Age-related osteoporosis without current pathological fracture: Secondary | ICD-10-CM | POA: Diagnosis not present

## 2019-11-28 DIAGNOSIS — I2510 Atherosclerotic heart disease of native coronary artery without angina pectoris: Secondary | ICD-10-CM | POA: Diagnosis not present

## 2019-11-28 DIAGNOSIS — D510 Vitamin B12 deficiency anemia due to intrinsic factor deficiency: Secondary | ICD-10-CM | POA: Diagnosis not present

## 2019-11-28 DIAGNOSIS — C3412 Malignant neoplasm of upper lobe, left bronchus or lung: Secondary | ICD-10-CM | POA: Diagnosis not present

## 2019-11-28 DIAGNOSIS — J449 Chronic obstructive pulmonary disease, unspecified: Secondary | ICD-10-CM | POA: Diagnosis not present

## 2019-12-02 DIAGNOSIS — D510 Vitamin B12 deficiency anemia due to intrinsic factor deficiency: Secondary | ICD-10-CM | POA: Diagnosis not present

## 2019-12-05 ENCOUNTER — Ambulatory Visit (INDEPENDENT_AMBULATORY_CARE_PROVIDER_SITE_OTHER): Payer: Medicare HMO | Admitting: Primary Care

## 2019-12-05 ENCOUNTER — Other Ambulatory Visit: Payer: Self-pay

## 2019-12-05 ENCOUNTER — Other Ambulatory Visit: Payer: Self-pay | Admitting: *Deleted

## 2019-12-05 ENCOUNTER — Encounter: Payer: Self-pay | Admitting: Primary Care

## 2019-12-05 ENCOUNTER — Telehealth: Payer: Self-pay | Admitting: Pulmonary Disease

## 2019-12-05 DIAGNOSIS — J45901 Unspecified asthma with (acute) exacerbation: Secondary | ICD-10-CM | POA: Diagnosis not present

## 2019-12-05 DIAGNOSIS — J449 Chronic obstructive pulmonary disease, unspecified: Secondary | ICD-10-CM | POA: Diagnosis not present

## 2019-12-05 DIAGNOSIS — J4489 Other specified chronic obstructive pulmonary disease: Secondary | ICD-10-CM

## 2019-12-05 MED ORDER — TRELEGY ELLIPTA 200-62.5-25 MCG/INH IN AEPB: INHALATION_SPRAY | RESPIRATORY_TRACT | 3 refills | Status: DC

## 2019-12-05 MED ORDER — PREDNISONE 10 MG PO TABS: ORAL_TABLET | ORAL | 0 refills | Status: DC

## 2019-12-05 NOTE — Patient Instructions (Signed)
Take 1,200mg  mucinex twice daily x 5 days Sending in prednisone taper Continue Trelegy Ellipta once daily Use albuterol nebulizer every 6 hours for sob/wheezing Use flutter valve three times a day  Follow up if symptoms do not improve or worsen

## 2019-12-05 NOTE — Telephone Encounter (Signed)
Spoke with patient she states that she started feeling bad this morning. She is having shortness of breath and a cough with thick white mucus. States she has done 1 neb treatment this morning and I had her use her rescue inhaler while we were on the phone. Per Beth I have added her to the schedule at 1:30pm for a Televisit as patient does not drive. Nothing further needed at this time.

## 2019-12-05 NOTE — Progress Notes (Signed)
Virtual Visit via Telephone Note  I connected with Lori Butler on 12/05/19 at  1:30 PM EDT by telephone and verified that I am speaking with the correct person using two identifiers.  Location: Patient: Home Provider: Office   I discussed the limitations, risks, security and privacy concerns of performing an evaluation and management service by telephone and the availability of in person appointments. I also discussed with the patient that there may be a patient responsible charge related to this service. The patient expressed understanding and agreed to proceed.   History of Present Illness:  69 year old female, former smoker quit May 2019 (74 pack year hx). PMH significant for COPD, malignant neoplasm upper lobe left lung, hypoxia, anxiety/depression, attempted suicide, tobacco abuse. Patient of Dr. Elsworth Soho. Eosinophils absolute in August 2019 were 700. Sudden onset and variability seem to suggest component of asthma. Maintained on Trelegy.  LDCT showed lung RADS 4x. PET scan June 2020 positive LUL hypermetabolic spiculated pulmonary nodules consistent with primary bronchogenic carcinoma. Referred to CT surgery for second opinion. Seen by Dr. Roxan Hockey on 10/29/18. 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 Patient not interested in surgery. Underwent navigation bronchoscopy for bx and fiducial placement July 2020.  Pathology and cytology were non-diagnostic with atypical cells no malignant cells. Case discussed at [facility], felt patient could continue observation versus stereotactic therapy. Patient did not wish to wait and wanted to proceed with radiation therapy despite definitive carcinoma. She began SBRT 12/03/18 and has had three treatments total. Last treatment 8/4. 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 Treated with doxycycline course and extended prednisone taper. Follow-up CT chest showed LUL nodule 1.2 cm unchanged from previous,  no new pulmonary nodules/masses. Moderate to advanced changes parseptal and centrilobular emphysema. Biapical pleuroparenchymal scarring. No axillary or supraclavicular adenopathy. No mediastinal or hilar adenopathy. Per oncology they are pleased with her post treatment imaging and will see her in 6 months for continued evaluation.   Previous [facility] pulmonary encounters: 03/20/2019  Patient presents today for hospital follow-up. Admitted from 10/23-10/26 for COPD exacerbation treated with steroids, nebulizer's and qualified for supplemental oxygen. She was covid negative. CTA negative for PE. She was also treated with course of antibiotics for RLE cellulitis. She is feeling ok but her breathing worsens when tapering down on prednisone. Feels she goes down hill when off of oral steroids. Reports wheezing and seasonal nasal rhinitis. She is wearing 2L continuous oxygen. Receiving physical therapy twice a week. Started on TRELEGY.   04/10/19 Office visit- COPD, Dr. Elsworth Soho.  Improved with Trelegy. Decreased prednisone 5mg  daily. FU in 4 months.   05/28/2019 Patient report barking cough x 2 days. She started to cough up purulent mucus today. Associated fatigue. She is eating and drinking normally. Since starting Trelegy she has not experienced chest tightness and feels she is breathing better. Continue to use 2L oxygen at night.  No significant shortness of breath or nocturnal symptoms. No recent sick contacts or known exposure to covid. Denies fever, chills, sweats, chest tightness, wheezing HA, N/V/D.   06/30/2019 Patient contacted today for acute video visit. Reports fatigue, body aches, low grade temp, sneezing, runny nose, PND, cough. Cough is productive with white mucus, no reports of purulent color or hemoptysis. Her symptoms started 3 days ago. She has been compliant with Trelegy 1 puff daily. Mild wheezing with no chest tightness. She takes mucinex 600mg  twice daily and Singulair at bedtime. She uses her  albuterol nebulizer in the  morning to keep her airways opening but has not required her rescue inhaler. Reports new right foot swelling, hx cellulitis in fall 2020 treated with clindamycin. Denies erythema or warmth. She has not been on any recent antibiotics. Denies any known sick contacts. She has not left her house in 2-3 weeks. She gets her groceries delivered. She did have one friend come visit last Tuesday but they wore a mask and have not been sick.   12/05/2019 Patient contacted today for acute visit. Reports chest congestion with thick white mucus starting this morning. Associated shortness of breath/wheezing. She has having an extremly hard time getting sputum up. Took her Trelegy inhaler this morning around 5am. She used her nebulizer and rescue inhaler both once today with some improvement. States that she is doing a little better. She takes 600mg  mucinex twice daily. Hasn't been using her flutter valve regularly. Last took prednisone she had on hand in June. Denies fever, chills, sweats, purulent mucus.    Observations/Objective:  - Audible throat congestion. No significant respiratory distress or overt wheezing   Assessment and Plan:  Asthmatic bronchitis: - Congested cough with clear thick mucus, associated shortness of breath/wheezing  - NO sign of bacterial infection, holding off on abx at this time  - RX prednisone taper (40mg  x 2 days; 30mg  x 2 days; 20mg  x 2 days; 10mg  x 2 days) - Recommend increasing mucinex to 1,200mg  BID x 5 days and resume flutter valve 2-3 times a day - If no improvement recommend CXR   COPD mixed type: - Continue Trelegy 200 one puff daily; albuterol nebulizer q6 hours for breakthrough shortness of breath/wheezing  Follow Up Instructions:   - Notify office if no improvement with above plan or if symptoms worsen  I discussed the assessment and treatment plan with the patient. The patient was provided an opportunity to ask questions and all were  answered. The patient agreed with the plan and demonstrated an understanding of the instructions.   The patient was advised to call back or seek an in-person evaluation if the symptoms worsen or if the condition fails to improve as anticipated.  I provided 18 minutes of non-face-to-face time during this encounter.   Martyn Ehrich, NP

## 2019-12-09 DIAGNOSIS — C3412 Malignant neoplasm of upper lobe, left bronchus or lung: Secondary | ICD-10-CM | POA: Diagnosis not present

## 2019-12-09 DIAGNOSIS — D510 Vitamin B12 deficiency anemia due to intrinsic factor deficiency: Secondary | ICD-10-CM | POA: Diagnosis not present

## 2019-12-09 DIAGNOSIS — J449 Chronic obstructive pulmonary disease, unspecified: Secondary | ICD-10-CM | POA: Diagnosis not present

## 2019-12-16 DIAGNOSIS — R946 Abnormal results of thyroid function studies: Secondary | ICD-10-CM | POA: Diagnosis not present

## 2019-12-16 DIAGNOSIS — D510 Vitamin B12 deficiency anemia due to intrinsic factor deficiency: Secondary | ICD-10-CM | POA: Diagnosis not present

## 2019-12-16 DIAGNOSIS — G629 Polyneuropathy, unspecified: Secondary | ICD-10-CM | POA: Diagnosis not present

## 2019-12-16 DIAGNOSIS — E538 Deficiency of other specified B group vitamins: Secondary | ICD-10-CM | POA: Diagnosis not present

## 2019-12-26 IMAGING — CT CT ANGIO CHEST
2 of 6 series · 18 of 36 positions shown · IV contrast (Omni 300)
Comparison: Chest x-ray obtained earlier today; most recent prior
chest CT 09/15/2017

CLINICAL DATA: 67-year-old female with acute onset shortness of
breath.

EXAM:
CT ANGIOGRAPHY CHEST WITH CONTRAST
TECHNIQUE: Multidetector CT imaging of the chest was performed using the
standard protocol during bolus administration of intravenous
contrast. Multiplanar CT image reconstructions and MIPs were
obtained to evaluate the vascular anatomy.
CONTRAST:  100mL RV4GKL-DQU IOPAMIDOL (RV4GKL-DQU) INJECTION 76%

[Series 7: pe thins · axial · 0.65mm/px · z∈[+157,+434]mm · 17 of 441 slices shown]
[im 23/441  lung]
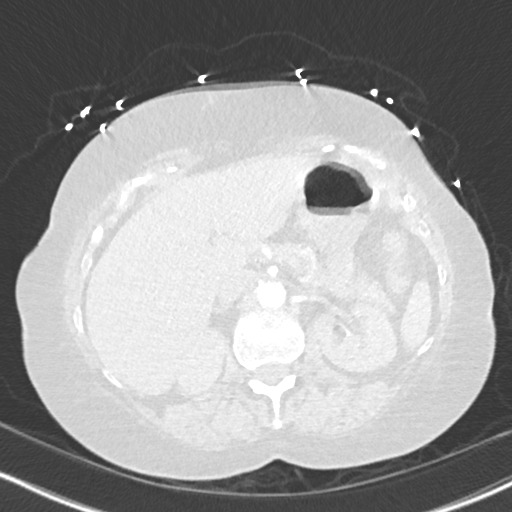
[im 45/441  mediastinal]
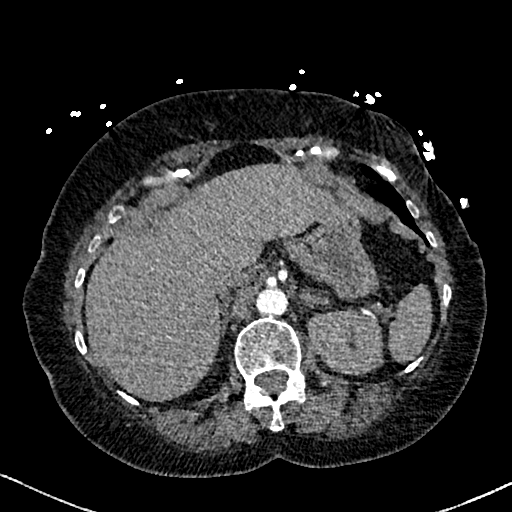
[im 67/441  lung]
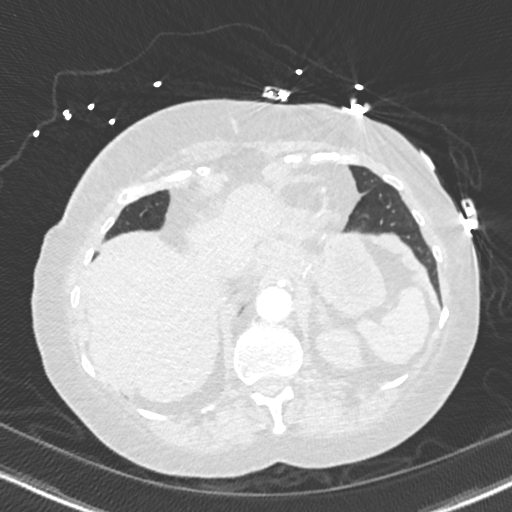
[im 89/441  mediastinal]
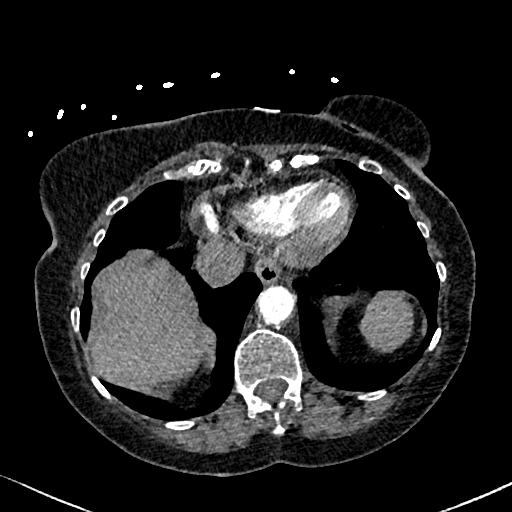
[im 133/441  lung]
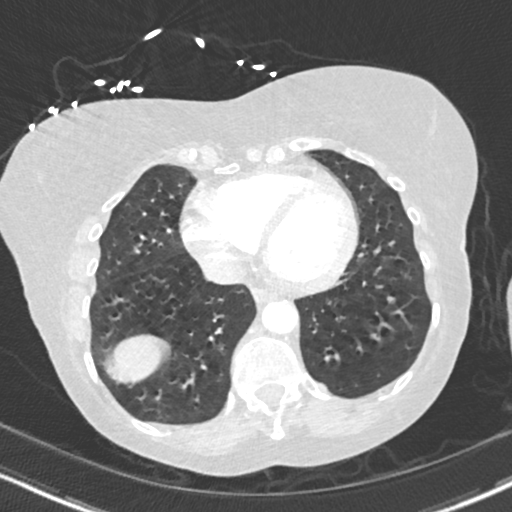
[im 155/441  mediastinal]
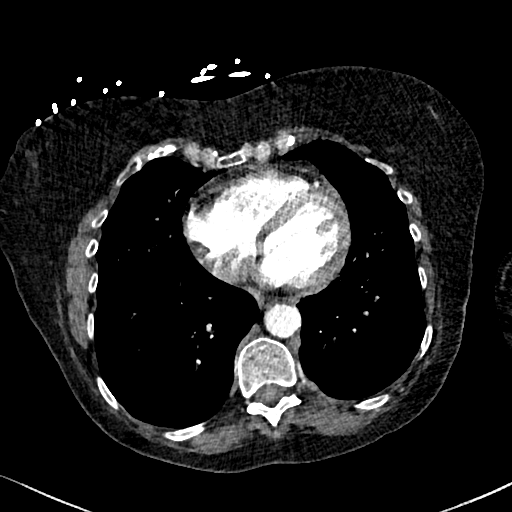
[im 177/441  lung]
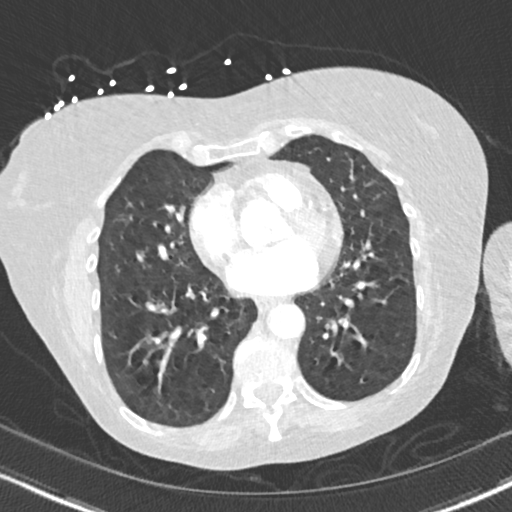
[im 199/441  mediastinal]
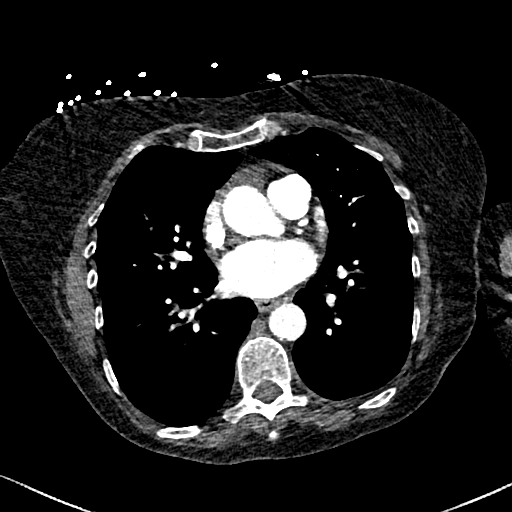
[im 221/441  lung]
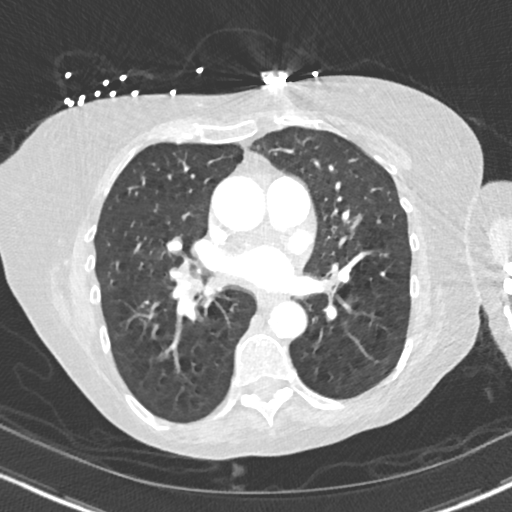
[im 243/441  mediastinal]
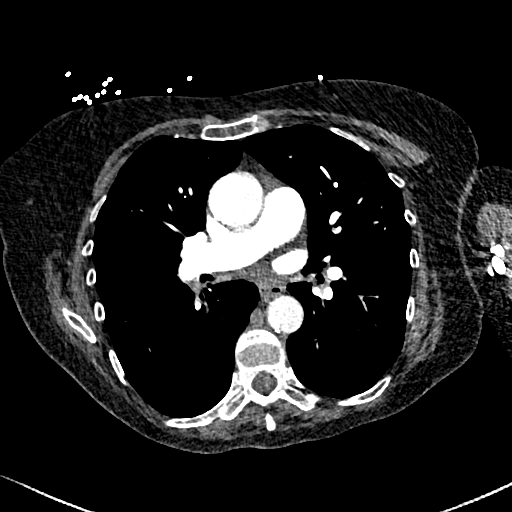
[im 265/441  lung]
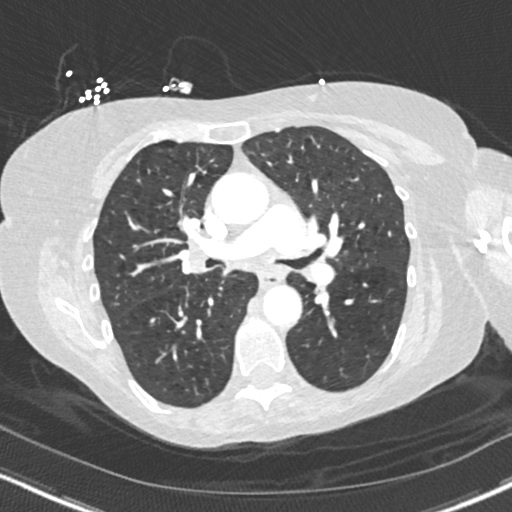
[im 287/441  mediastinal]
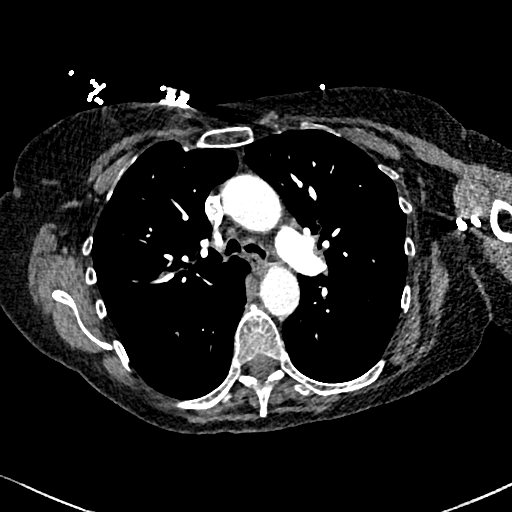
[im 309/441  lung]
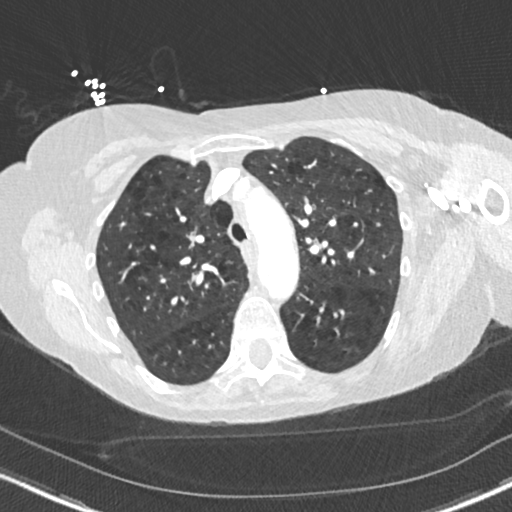
[im 353/441  mediastinal]
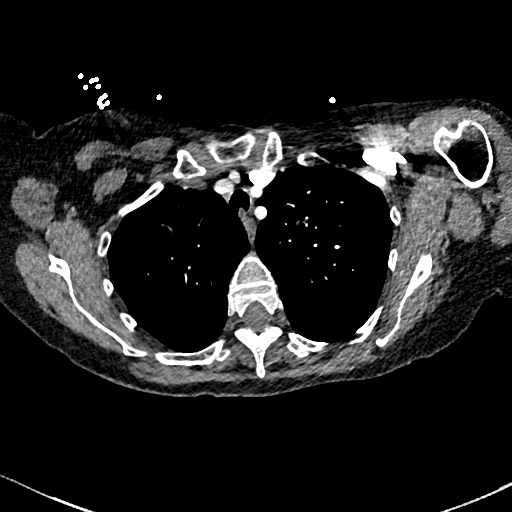
[im 375/441  lung]
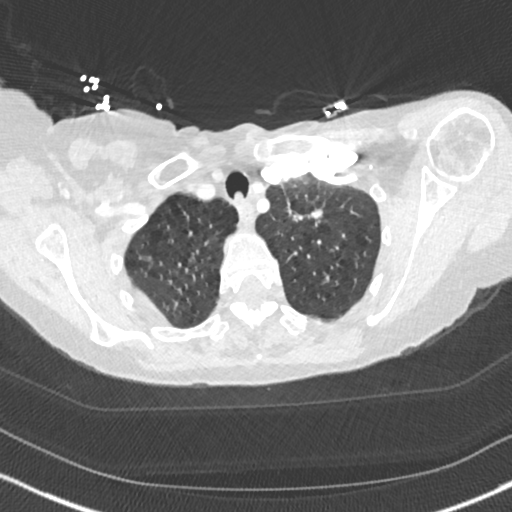
[im 397/441  mediastinal]
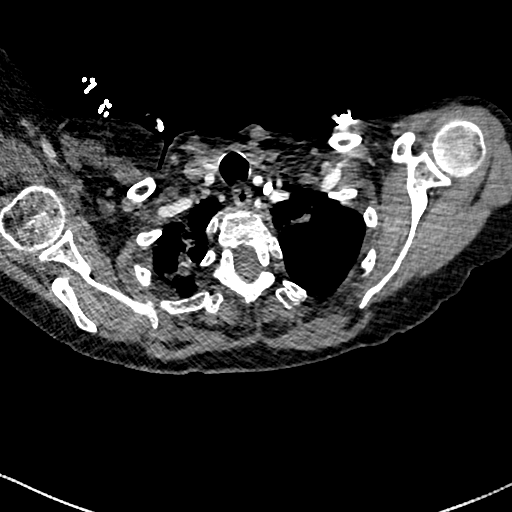
[im 419/441  lung]
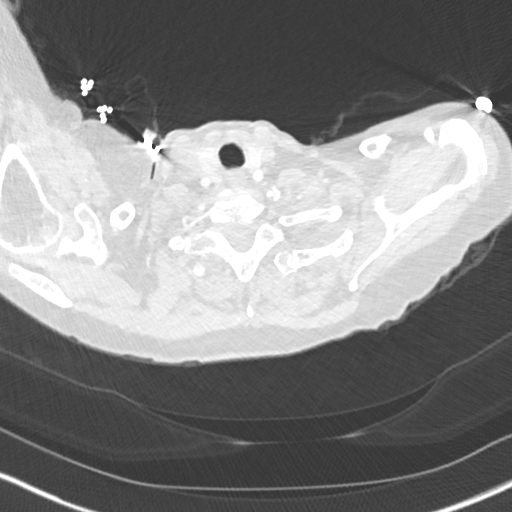

[Series 8: pe 2mm cor · coronal · 0.63mm/px · 1 of 151 slices shown]
[im 76/151  mediastinal]
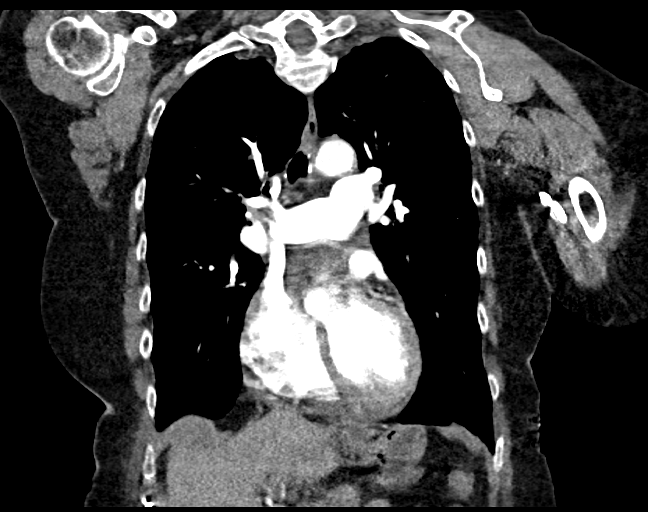

[18 of 36 positions shown; findings below may reference images not displayed]

FINDINGS: Cardiovascular: Adequate opacification of the pulmonary arteries to
the proximal segmental level. No evidence of central filling defect
to suggest acute pulmonary embolus. The main pulmonary artery is
normal in size. There is mild fusiform dilation of the tubular
portion of the ascending thoracic aorta, however it remains normal
in size. The aortic root is also normal in size. Trace
atherosclerotic calcifications. No evidence of dissection. The heart
is normal in size. No pericardial effusion.

Mediastinum/Nodes: Unremarkable CT appearance of the thyroid gland.
No suspicious mediastinal or hilar adenopathy. No soft tissue
mediastinal mass. The thoracic esophagus is unremarkable.

Lungs/Pleura: Biapical pleuroparenchymal scarring. Moderately severe
centrilobular pulmonary emphysema. Moderate respiratory motion
artifact. No suspicious nodule or mass.

Upper Abdomen: No acute abnormality.

Musculoskeletal: No chest wall abnormality. No acute or significant
osseous findings.

Review of the MIP images confirms the above findings.
IMPRESSION: 1. Negative for acute pulmonary embolus, pneumonia or other acute
cardiopulmonary process.

Aortic Atherosclerosis (EZZQP-D7D.D) and Emphysema (EZZQP-KW7.R).

## 2019-12-30 ENCOUNTER — Other Ambulatory Visit: Payer: Self-pay

## 2019-12-30 NOTE — Patient Outreach (Signed)
[facility]) Care Management  12/30/2019  Laquiesha Piacente Caver 1950-06-15 [id number]   Telephone Assessment   Outreach attempt to patient. Spoke with patient who voices she has been doing fairly well overall. She did have a very mild flare up of COPD that she was easily treated with steroids and it ha resolved. Patient reports that she is not going out much due to hot weather and COVID-19 outbreak. She is adhering to safety guidelines. Patient reported receiving her COVID-19 Pfizer vaccine back in April(unsure of dates). She does plan to get the booster vaccine when it is time for her to do so. She is aware of the need for her annual flu vaccine and reports sh will make an appt. Appetite remains WNL for patient. No wgt changes. She reports that she did fall about three weeks ago in her home. She is using her walker more to aide in safety. She has several upcoming MD appts within the net two months. She reports that she ws having some numbness to her feet and PCP has referred her to see nero MD(appt 01/19/20). Patient denies any pain at present and reported pain was not severe and infrequent. She has to follow up with oncologist to assess status of cancer and is hopeful that her cancer is doing well. She denies any RN CM needs or concerns at this time.  Medications Reviewed Today    Reviewed by Hayden Pedro, RN (Registered Nurse) on 12/30/19 at [phone]  Med List Status: <None>  Medication Order Taking? Sig Documenting Provider Last Dose Status Informant  albuterol (PROVENTIL HFA;VENTOLIN HFA) 108 (90 Base) MCG/ACT inhaler [id number]  Inhale 2 puffs into the lungs every 6 (six) hours as needed for wheezing or shortness of breath. Rigoberto Noel, MD  Active Self  albuterol (PROVENTIL) (2.5 MG/3ML) 0.083% nebulizer solution [id number]  Take 3 mLs (2.5 mg total) by nebulization every 6 (six) hours as needed for wheezing or shortness of breath. Rigoberto Noel, MD  Active Self   alendronate (FOSAMAX) 70 MG tablet [id number]  Take 70 mg by mouth every Wednesday.  [provider]  Active Self           Med Note [name] Dec 24, 2017  2:52 PM)    atorvastatin (LIPITOR) 10 MG tablet [id number]  Take 10 mg by mouth daily. [provider]  Active Self  Cholecalciferol (VITAMIN D) 50 MCG (2000 UT) tablet [id number]  Take 2,000 Units by mouth daily. [provider]  Active Self  Fluticasone-Umeclidin-Vilant (TRELEGY ELLIPTA) 200-62.5-25 MCG/INH AEPB [id number]  INHALE ONE PUFF INTO THE LUNGS EVERY DAY Martyn Ehrich, NP  Active   guaiFENesin (MUCINEX) 600 MG 12 hr tablet [id number]  Take 2 tablets (1,200 mg total) by mouth 2 (two) times daily.  Patient taking differently: Take 600 mg by mouth 2 (two) times daily.    Martyn Ehrich, NP  Active Self  montelukast (SINGULAIR) 10 MG tablet [id number]  Take 1 tablet (10 mg total) by mouth at bedtime. Arnell Asal, NP  Active Self  naproxen (NAPROSYN) 375 MG tablet [id number]  Take 1 tablet (375 mg total) by mouth 2 (two) times daily. Nat Christen, MD  Active Self  pantoprazole (PROTONIX) 40 MG tablet [id number]  Take 1 tablet (40 mg total) by mouth 2 (two) times daily.  Patient taking differently: Take 40 mg by mouth daily.    Arnell Asal, NP  Active Self  predniSONE (DELTASONE) 10  MG tablet [id number] No Take 4 tabs po daily x 2 days; then 3 tabs for 2 days; then 2 tabs for 2 days; then 1 tab for 2 days  Patient not taking: Reported on 12/30/2019   Martyn Ehrich, NP Not Taking Active   sertraline (ZOLOFT) 50 MG tablet [id number]  Take 1 tablet (50 mg total) by mouth daily. McNew, Tyson Babinski, MD  Active Self            [facility] CM Care Plan Problem One     Most Recent Value  Care Plan Problem One Patient with increased weakness-at risk for falls  Role Documenting the Problem One Care Management [place] for Problem One Active  [facility] Long Term Goal  Patient  will sustain no falls over the next 60 days.  [facility] Long Term Goal Start Date 09/12/19  Interventions for Problem One Long Term Goal RN CM completed fall/safety eval, provided education to pt on fall safety measures, assessed for adequate and appropriate DME usage  [facility] CM Short Term Goal #1  Patient will discuss with MD possible PT for ambulation and strengthening over the next 30 days.   [facility] CM Short Term Goal #1 Start Date 09/12/19  Interventions for Short Term Goal #1 RN CM encouraged pt to discuss wth PCP and reviewed benefits.     [facility] CM Care Plan Problem Two     Most Recent Value  Care Plan Problem Two Health maintenance-patient needs COVID-19 vaccine.  Role Documenting the Problem Two Care Management [place] for Problem Two Active  Interventions for Problem Two Long Term Goal  RN CM discussed benefits of booster vaccine, isntructed patient on how to obtain one  [facility] Long Term Goal Patient will report obtaining COVID-19 booster vaccine within the next180 days.  [facility] Long Term Goal Start Date 12/30/19    [facility] CM Care Plan Problem Three     Most Recent Value  Care Plan Problem Three Patient with history of lung CA  Role Documenting the Problem Three Care Management Telephonic Coordinator  Care Plan for Problem Three Active  [facility] Long Term Goal  Patient will report having scans/testings done to assess for lung CA status within the next 90 days.  [facility] Long Term Goal Start Date 12/30/19  Interventions for Problem Three Long Term Goal RNCM assessed for any sxs, RNCM confirmed MD and scans/testing appts in place     Plan: RN CM discussed with patient next outreach within the month of October. Patient gave verbal consent and in agreement with RN CM follow up and timeframe. Patient aware that they may contact RN CM sooner for any issues or concerns.  RN CM will send quarterly update to PCP.   [name], RN,BSN,CCM [place] Management Telephonic Care Management  Coordinator Direct Phone: [phone] Toll Free: [phone] Fax: [phone]

## 2020-01-09 DIAGNOSIS — J449 Chronic obstructive pulmonary disease, unspecified: Secondary | ICD-10-CM | POA: Diagnosis not present

## 2020-01-09 DIAGNOSIS — C3412 Malignant neoplasm of upper lobe, left bronchus or lung: Secondary | ICD-10-CM | POA: Diagnosis not present

## 2020-01-19 ENCOUNTER — Ambulatory Visit: Payer: Medicare HMO | Admitting: Neurology

## 2020-02-03 DIAGNOSIS — R05 Cough: Secondary | ICD-10-CM | POA: Diagnosis not present

## 2020-02-03 DIAGNOSIS — J449 Chronic obstructive pulmonary disease, unspecified: Secondary | ICD-10-CM | POA: Diagnosis not present

## 2020-02-04 ENCOUNTER — Other Ambulatory Visit: Payer: Self-pay

## 2020-02-04 ENCOUNTER — Emergency Department (HOSPITAL_COMMUNITY): Payer: Medicare HMO

## 2020-02-04 ENCOUNTER — Encounter (HOSPITAL_COMMUNITY): Payer: Self-pay

## 2020-02-04 ENCOUNTER — Inpatient Hospital Stay (HOSPITAL_COMMUNITY)
Admission: EM | Admit: 2020-02-04 | Discharge: 2020-02-08 | DRG: 177 | Disposition: A | Discharge status: Home Health | Payer: Medicare HMO | Attending: Internal Medicine | Admitting: Internal Medicine

## 2020-02-04 ENCOUNTER — Ambulatory Visit (HOSPITAL_COMMUNITY): Payer: Medicare HMO

## 2020-02-04 DIAGNOSIS — G4489 Other headache syndrome: Secondary | ICD-10-CM | POA: Diagnosis not present

## 2020-02-04 DIAGNOSIS — Z87891 Personal history of nicotine dependence: Secondary | ICD-10-CM

## 2020-02-04 DIAGNOSIS — J1282 Pneumonia due to coronavirus disease 2019: Secondary | ICD-10-CM | POA: Diagnosis present

## 2020-02-04 DIAGNOSIS — Z85118 Personal history of other malignant neoplasm of bronchus and lung: Secondary | ICD-10-CM

## 2020-02-04 DIAGNOSIS — M810 Age-related osteoporosis without current pathological fracture: Secondary | ICD-10-CM | POA: Diagnosis present

## 2020-02-04 DIAGNOSIS — J9621 Acute and chronic respiratory failure with hypoxia: Secondary | ICD-10-CM | POA: Diagnosis present

## 2020-02-04 DIAGNOSIS — Z803 Family history of malignant neoplasm of breast: Secondary | ICD-10-CM | POA: Diagnosis not present

## 2020-02-04 DIAGNOSIS — J449 Chronic obstructive pulmonary disease, unspecified: Secondary | ICD-10-CM | POA: Diagnosis present

## 2020-02-04 DIAGNOSIS — R070 Pain in throat: Secondary | ICD-10-CM | POA: Diagnosis not present

## 2020-02-04 DIAGNOSIS — K219 Gastro-esophageal reflux disease without esophagitis: Secondary | ICD-10-CM | POA: Diagnosis not present

## 2020-02-04 DIAGNOSIS — R5381 Other malaise: Secondary | ICD-10-CM | POA: Diagnosis present

## 2020-02-04 DIAGNOSIS — F322 Major depressive disorder, single episode, severe without psychotic features: Secondary | ICD-10-CM | POA: Diagnosis not present

## 2020-02-04 DIAGNOSIS — Z923 Personal history of irradiation: Secondary | ICD-10-CM

## 2020-02-04 DIAGNOSIS — R05 Cough: Secondary | ICD-10-CM | POA: Diagnosis not present

## 2020-02-04 DIAGNOSIS — U071 COVID-19: Secondary | ICD-10-CM | POA: Diagnosis present

## 2020-02-04 DIAGNOSIS — Z7983 Long term (current) use of bisphosphonates: Secondary | ICD-10-CM

## 2020-02-04 DIAGNOSIS — I10 Essential (primary) hypertension: Secondary | ICD-10-CM | POA: Diagnosis not present

## 2020-02-04 DIAGNOSIS — J4489 Other specified chronic obstructive pulmonary disease: Secondary | ICD-10-CM | POA: Diagnosis present

## 2020-02-04 DIAGNOSIS — C3412 Malignant neoplasm of upper lobe, left bronchus or lung: Secondary | ICD-10-CM | POA: Diagnosis present

## 2020-02-04 DIAGNOSIS — F339 Major depressive disorder, recurrent, unspecified: Secondary | ICD-10-CM | POA: Diagnosis present

## 2020-02-04 DIAGNOSIS — Z209 Contact with and (suspected) exposure to unspecified communicable disease: Secondary | ICD-10-CM | POA: Diagnosis not present

## 2020-02-04 DIAGNOSIS — Z79899 Other long term (current) drug therapy: Secondary | ICD-10-CM | POA: Diagnosis not present

## 2020-02-04 DIAGNOSIS — J441 Chronic obstructive pulmonary disease with (acute) exacerbation: Secondary | ICD-10-CM | POA: Diagnosis present

## 2020-02-04 DIAGNOSIS — F29 Unspecified psychosis not due to a substance or known physiological condition: Secondary | ICD-10-CM | POA: Diagnosis not present

## 2020-02-04 DIAGNOSIS — I2510 Atherosclerotic heart disease of native coronary artery without angina pectoris: Secondary | ICD-10-CM | POA: Diagnosis not present

## 2020-02-04 DIAGNOSIS — E7800 Pure hypercholesterolemia, unspecified: Secondary | ICD-10-CM | POA: Diagnosis not present

## 2020-02-04 DIAGNOSIS — Z7951 Long term (current) use of inhaled steroids: Secondary | ICD-10-CM | POA: Diagnosis not present

## 2020-02-04 DIAGNOSIS — Z9981 Dependence on supplemental oxygen: Secondary | ICD-10-CM

## 2020-02-04 DIAGNOSIS — D510 Vitamin B12 deficiency anemia due to intrinsic factor deficiency: Secondary | ICD-10-CM | POA: Diagnosis not present

## 2020-02-04 DIAGNOSIS — Z7401 Bed confinement status: Secondary | ICD-10-CM | POA: Diagnosis not present

## 2020-02-04 DIAGNOSIS — R0602 Shortness of breath: Secondary | ICD-10-CM | POA: Diagnosis not present

## 2020-02-04 DIAGNOSIS — R0902 Hypoxemia: Secondary | ICD-10-CM | POA: Diagnosis not present

## 2020-02-04 DIAGNOSIS — R531 Weakness: Secondary | ICD-10-CM | POA: Diagnosis not present

## 2020-02-04 DIAGNOSIS — M2550 Pain in unspecified joint: Secondary | ICD-10-CM | POA: Diagnosis not present

## 2020-02-04 HISTORY — DX: COVID-19: U07.1

## 2020-02-04 LAB — CBC WITH DIFFERENTIAL/PLATELET
Abs Immature Granulocytes: 0.03 10*3/uL (ref 0.00–0.07)
Basophils Absolute: 0.1 10*3/uL (ref 0.0–0.1)
Basophils Relative: 1 %
Eosinophils Absolute: 0.1 10*3/uL (ref 0.0–0.5)
Eosinophils Relative: 1 %
HCT: 34.7 % — ABNORMAL LOW (ref 36.0–46.0)
Hemoglobin: 11.2 g/dL — ABNORMAL LOW (ref 12.0–15.0)
Immature Granulocytes: 0 %
Lymphocytes Relative: 8 %
Lymphs Abs: 0.7 10*3/uL (ref 0.7–4.0)
MCH: 28.5 pg (ref 26.0–34.0)
MCHC: 32.3 g/dL (ref 30.0–36.0)
MCV: 88.3 fL (ref 80.0–100.0)
Monocytes Absolute: 0.8 10*3/uL (ref 0.1–1.0)
Monocytes Relative: 10 %
Neutro Abs: 6.5 10*3/uL (ref 1.7–7.7)
Neutrophils Relative %: 80 %
Platelets: 253 10*3/uL (ref 150–400)
RBC: 3.93 MIL/uL (ref 3.87–5.11)
RDW: 13.4 % (ref 11.5–15.5)
WBC: 8.2 10*3/uL (ref 4.0–10.5)
nRBC: 0 % (ref 0.0–0.2)

## 2020-02-04 LAB — BLOOD GAS, VENOUS
Acid-Base Excess: 0.2 mmol/L (ref 0.0–2.0)
Bicarbonate: 24.8 mmol/L (ref 20.0–28.0)
O2 Saturation: 44.3 %
Patient temperature: 98.6
pCO2, Ven: 42.8 mmHg — ABNORMAL LOW (ref 44.0–60.0)
pH, Ven: 7.381 (ref 7.250–7.430)
pO2, Ven: 28.4 mmHg — CL (ref 32.0–45.0)

## 2020-02-04 LAB — LACTIC ACID, PLASMA
Lactic Acid, Venous: 0.7 mmol/L (ref 0.5–1.9)
Lactic Acid, Venous: 0.6 mmol/L (ref 0.5–1.9)

## 2020-02-04 LAB — COMPREHENSIVE METABOLIC PANEL
ALT: 28 U/L (ref 0–44)
AST: 36 U/L (ref 15–41)
Albumin: 3.9 g/dL (ref 3.5–5.0)
Alkaline Phosphatase: 366 U/L — ABNORMAL HIGH (ref 38–126)
Anion gap: 9 (ref 5–15)
BUN: 15 mg/dL (ref 8–23)
CO2: 23 mmol/L (ref 22–32)
Calcium: 8.2 mg/dL — ABNORMAL LOW (ref 8.9–10.3)
Chloride: 101 mmol/L (ref 98–111)
Creatinine, Ser: 0.6 mg/dL (ref 0.44–1.00)
GFR calc Af Amer: >60 mL/min (ref >60)
GFR calc non Af Amer: >60 mL/min (ref >60)
Glucose, Bld: 74 mg/dL (ref 70–99)
Potassium: 3.8 mmol/L (ref 3.5–5.1)
Sodium: 133 mmol/L — ABNORMAL LOW (ref 135–145)
Total Bilirubin: 0.7 mg/dL (ref 0.3–1.2)
Total Protein: 7.1 g/dL (ref 6.5–8.1)

## 2020-02-04 LAB — C-REACTIVE PROTEIN: CRP: 5.2 mg/dL — ABNORMAL HIGH (ref <1.0)

## 2020-02-04 LAB — D-DIMER, QUANTITATIVE: D-Dimer, Quant: 1.31 ug/mL-FEU — ABNORMAL HIGH (ref 0.00–0.50)

## 2020-02-04 LAB — PROCALCITONIN: Procalcitonin: <0.1 ng/mL

## 2020-02-04 LAB — RESPIRATORY PANEL BY RT PCR (FLU A&B, COVID)
Influenza A by PCR: NEGATIVE
Influenza B by PCR: NEGATIVE
SARS Coronavirus 2 by RT PCR: POSITIVE — AB

## 2020-02-04 LAB — TRIGLYCERIDES: Triglycerides: 55 mg/dL (ref <150)

## 2020-02-04 LAB — LACTATE DEHYDROGENASE: LDH: 183 U/L (ref 98–192)

## 2020-02-04 LAB — FIBRINOGEN: Fibrinogen: 400 mg/dL (ref 210–475)

## 2020-02-04 LAB — FERRITIN: Ferritin: 53 ng/mL (ref 11–307)

## 2020-02-04 MED ORDER — ASCORBIC ACID 500 MG PO TABS
500.0000 mg | ORAL_TABLET | Freq: Every day | ORAL | Status: DC
  Administered 2020-02-04 – 2020-02-08 (×5): 500 mg via ORAL
  Filled 2020-02-04 (×5): qty 1

## 2020-02-04 MED ORDER — SODIUM CHLORIDE 0.9 % IV SOLN
100.0000 mg | INTRAVENOUS | Status: AC
  Filled 2020-02-04: qty 20

## 2020-02-04 MED ORDER — ATORVASTATIN CALCIUM 10 MG PO TABS
10.0000 mg | ORAL_TABLET | Freq: Every day | ORAL | Status: DC
  Administered 2020-02-05 – 2020-02-08 (×4): 10 mg via ORAL
  Filled 2020-02-04 (×4): qty 1

## 2020-02-04 MED ORDER — ACETAMINOPHEN 325 MG PO TABS
650.0000 mg | ORAL_TABLET | Freq: Four times a day (QID) | ORAL | Status: DC | PRN
  Administered 2020-02-05: 650 mg via ORAL
  Filled 2020-02-04: qty 2

## 2020-02-04 MED ORDER — METHYLPREDNISOLONE SODIUM SUCC 125 MG IJ SOLR
60.0000 mg | Freq: Two times a day (BID) | INTRAMUSCULAR | Status: DC
  Administered 2020-02-05: 60 mg via INTRAVENOUS
  Filled 2020-02-04 (×2): qty 2

## 2020-02-04 MED ORDER — GUAIFENESIN-DM 100-10 MG/5ML PO SYRP
10.0000 mL | ORAL_SOLUTION | ORAL | Status: DC | PRN
  Administered 2020-02-07 – 2020-02-08 (×2): 10 mL via ORAL
  Filled 2020-02-04 (×2): qty 10

## 2020-02-04 MED ORDER — PANTOPRAZOLE SODIUM 40 MG PO TBEC
40.0000 mg | DELAYED_RELEASE_TABLET | Freq: Every day | ORAL | Status: DC
  Administered 2020-02-05 – 2020-02-08 (×4): 40 mg via ORAL
  Filled 2020-02-04 (×4): qty 1

## 2020-02-04 MED ORDER — ONDANSETRON HCL 4 MG/2ML IJ SOLN: 4.0000 mg | Freq: Four times a day (QID) | INTRAMUSCULAR | Status: DC | PRN

## 2020-02-04 MED ORDER — ALBUTEROL SULFATE HFA 108 (90 BASE) MCG/ACT IN AERS
2.0000 | INHALATION_SPRAY | Freq: Four times a day (QID) | RESPIRATORY_TRACT | Status: DC | PRN
  Administered 2020-02-07: 2 via RESPIRATORY_TRACT
  Filled 2020-02-04: qty 6.7

## 2020-02-04 MED ORDER — ALBUTEROL SULFATE (2.5 MG/3ML) 0.083% IN NEBU: 2.5000 mg | INHALATION_SOLUTION | Freq: Four times a day (QID) | RESPIRATORY_TRACT | Status: DC | PRN

## 2020-02-04 MED ORDER — ONDANSETRON HCL 4 MG PO TABS: 4.0000 mg | ORAL_TABLET | Freq: Four times a day (QID) | ORAL | Status: DC | PRN

## 2020-02-04 MED ORDER — ENOXAPARIN SODIUM 40 MG/0.4ML ~~LOC~~ SOLN: 40.0000 mg | SUBCUTANEOUS | Status: DC

## 2020-02-04 MED ORDER — SODIUM CHLORIDE 0.9 % IV SOLN
200.0000 mg | Freq: Once | INTRAVENOUS | Status: DC
  Filled 2020-02-04: qty 40

## 2020-02-04 MED ORDER — VITAMIN D 25 MCG (1000 UNIT) PO TABS
2000.0000 [IU] | ORAL_TABLET | Freq: Every day | ORAL | Status: DC
  Administered 2020-02-05 – 2020-02-08 (×4): 2000 [IU] via ORAL
  Filled 2020-02-04 (×4): qty 2

## 2020-02-04 MED ORDER — SERTRALINE HCL 50 MG PO TABS
50.0000 mg | ORAL_TABLET | Freq: Every day | ORAL | Status: DC
  Administered 2020-02-05 – 2020-02-08 (×4): 50 mg via ORAL
  Filled 2020-02-04 (×4): qty 1

## 2020-02-04 MED ORDER — ZINC SULFATE 220 (50 ZN) MG PO CAPS
220.0000 mg | ORAL_CAPSULE | Freq: Every day | ORAL | Status: DC
  Administered 2020-02-04 – 2020-02-08 (×5): 220 mg via ORAL
  Filled 2020-02-04 (×5): qty 1

## 2020-02-04 MED ORDER — ENOXAPARIN SODIUM 40 MG/0.4ML ~~LOC~~ SOLN
40.0000 mg | SUBCUTANEOUS | Status: DC
  Administered 2020-02-04 – 2020-02-07 (×4): 40 mg via SUBCUTANEOUS
  Filled 2020-02-04 (×4): qty 0.4

## 2020-02-04 MED ORDER — SODIUM CHLORIDE 0.9 % IV SOLN
100.0000 mg | Freq: Every day | INTRAVENOUS | Status: AC
  Administered 2020-02-04 – 2020-02-06 (×4): 100 mg via INTRAVENOUS
  Filled 2020-02-04 (×3): qty 20

## 2020-02-04 MED ORDER — GUAIFENESIN ER 600 MG PO TB12
1200.0000 mg | ORAL_TABLET | Freq: Two times a day (BID) | ORAL | Status: DC
  Administered 2020-02-04 – 2020-02-08 (×8): 1200 mg via ORAL
  Filled 2020-02-04 (×8): qty 2

## 2020-02-04 MED ORDER — MONTELUKAST SODIUM 10 MG PO TABS
10.0000 mg | ORAL_TABLET | Freq: Every day | ORAL | Status: DC
  Administered 2020-02-04 – 2020-02-07 (×4): 10 mg via ORAL
  Filled 2020-02-04 (×5): qty 1

## 2020-02-04 MED ORDER — POLYETHYLENE GLYCOL 3350 17 G PO PACK: 17.0000 g | PACK | Freq: Every day | ORAL | Status: DC | PRN

## 2020-02-04 NOTE — H&P (Signed)
History and Physical    Lori Butler YWV:[id number] DOB: [birth year] DOA: 02/04/2020  PCP: Lori Lass, MD  Patient coming from: Home  I have personally briefly reviewed patient's old medical records available.   Chief Complaint: Cough, congestion, shortness of breath and weakness  HPI: Lori Butler is a 69 y.o. female with medical history significant of COPD with 70-pack-year smoking history on 2 L oxygen at home, left upper lobe lung tumor status post radiation treatment and history of depression presented to the ER with 2 days of sore throat, headache, body ache cough and fever.  She had one episode of diarrhea.  She had visitors over the weekend and they tested positive.  She was feeling more weaker.  She walks with a walker.  She lives alone, was feeling more weaker every day.  Also had a low-grade temperature.  Called her primary care physician office who directed her to come to ER. ED Course: Heart rate 108, tachypneic with respiratory rate 33.  Blood pressures normal.  CBC and electrolytes are normal.  Procalcitonin is normal.  Chest x-ray shows mostly old scars.  She is needing 3 L of oxygen at rest, uses 2 L at home.  COVID-19 test was positive.  Review of Systems: all systems are reviewed and pertinent positive as per HPI otherwise rest are negative.    Past Medical History:  Diagnosis Date  . Acute respiratory failure with hypoxia ([place])   . Anxiety   . Aortic atherosclerosis ([place])   . Arthritis    "a little in my hands probably" (10/31/2017)  . Asthma   . COPD (chronic obstructive pulmonary disease) ([place])   . COPD exacerbation ([place])   . Depression   . History of kidney stones   . lung ca dx'd 10/2018  . Suicide attempt ([place]) 02/21/2018   overdosed on Wellbutrin    Past Surgical History:  Procedure Laterality Date  . ABDOMINAL HYSTERECTOMY    . APPENDECTOMY    . BREAST BIOPSY Left 1990s  . CATARACT EXTRACTION W/ INTRAOCULAR LENS  IMPLANT, BILATERAL  Bilateral   . DILATION AND CURETTAGE OF UTERUS    . FUDUCIAL PLACEMENT N/A 11/11/2018   Procedure: PLACEMENT OF FUDUCIAL;  Surgeon: Melrose Nakayama, MD;  Location: [facility];  Service: Thoracic;  Laterality: N/A;  . TONSILLECTOMY    . TUBAL LIGATION    . VIDEO BRONCHOSCOPY WITH ENDOBRONCHIAL NAVIGATION N/A 11/11/2018   Procedure: VIDEO BRONCHOSCOPY WITH ENDOBRONCHIAL NAVIGATION;  Surgeon: Melrose Nakayama, MD;  Location: [facility];  Service: Thoracic;  Laterality: N/A;   Social history:  reports that she quit smoking about 2 years ago. Her smoking use included cigarettes. She has a 76.50 pack-year smoking history. She has never used smokeless tobacco. She reports previous alcohol use. She reports that she does not use drugs.  Allergies  Allergen Reactions  . Demerol [Meperidine] Other (See Comments)    "horrible reaction"  . Oxycodone-Acetaminophen Other (See Comments)    Upset stomach   . Tramadol Nausea And Vomiting    Family History  Problem Relation Age of Onset  . Breast cancer Mother      Prior to Admission medications   Medication Sig Start Date End Date Taking? Authorizing Provider  albuterol (PROVENTIL HFA;VENTOLIN HFA) 108 (90 Base) MCG/ACT inhaler Inhale 2 puffs into the lungs every 6 (six) hours as needed for wheezing or shortness of breath. 06/18/18   Lori Noel, MD  albuterol (PROVENTIL) (2.5 MG/3ML) 0.083% nebulizer solution Take 3  mLs (2.5 mg total) by nebulization every 6 (six) hours as needed for wheezing or shortness of breath. 06/21/18   Lori Noel, MD  alendronate (FOSAMAX) 70 MG tablet Take 70 mg by mouth every Wednesday.  07/11/17   [provider]  atorvastatin (LIPITOR) 10 MG tablet Take 10 mg by mouth daily.    [provider]  Cholecalciferol (VITAMIN D) 50 MCG (2000 UT) tablet Take 2,000 Units by mouth daily.    [provider]  Fluticasone-Umeclidin-Vilant (TRELEGY ELLIPTA) 200-62.5-25 MCG/INH AEPB INHALE ONE PUFF INTO THE  LUNGS EVERY DAY 12/05/19   Lori Ehrich, NP  guaiFENesin (MUCINEX) 600 MG 12 hr tablet Take 2 tablets (1,200 mg total) by mouth 2 (two) times daily. Patient taking differently: Take 600 mg by mouth 2 (two) times daily.  02/06/19   Lori Ehrich, NP  montelukast (SINGULAIR) 10 MG tablet Take 1 tablet (10 mg total) by mouth at bedtime. 12/26/17   Lori Asal, NP  naproxen (NAPROSYN) 375 MG tablet Take 1 tablet (375 mg total) by mouth 2 (two) times daily. 02/19/19   Lori Christen, MD  pantoprazole (PROTONIX) 40 MG tablet Take 1 tablet (40 mg total) by mouth 2 (two) times daily. Patient taking differently: Take 40 mg by mouth daily.  12/26/17   Lori Asal, NP  predniSONE (DELTASONE) 10 MG tablet Take 4 tabs po daily x 2 days; then 3 tabs for 2 days; then 2 tabs for 2 days; then 1 tab for 2 days Patient not taking: Reported on 12/30/2019 12/05/19   Lori Ehrich, NP  sertraline (ZOLOFT) 50 MG tablet Take 1 tablet (50 mg total) by mouth daily. 03/01/18   Lori Crosby, MD    Physical Exam: Vitals:   02/04/20 1145 02/04/20 1230 02/04/20 1419 02/04/20 1430  BP: (!) 154/80 (!) 157/91 (!) 156/77 (!) 155/80  Pulse: 98 (!) 107 (!) 110 (!) 108  Resp: (!) 25 (!) 33 (!) 33 (!) 33  Temp:      TempSrc:      SpO2: 99% 99% 99% 100%    Constitutional: NAD, calm, comfortable Vitals:   02/04/20 1145 02/04/20 1230 02/04/20 1419 02/04/20 1430  BP: (!) 154/80 (!) 157/91 (!) 156/77 (!) 155/80  Pulse: 98 (!) 107 (!) 110 (!) 108  Resp: (!) 25 (!) 33 (!) 33 (!) 33  Temp:      TempSrc:      SpO2: 99% 99% 99% 100%   Eyes: PERRL, lids and conjunctivae normal ENMT: Mucous membranes are moist. Posterior pharynx clear of any exudate or lesions.Normal dentition.  Neck: normal, supple, no masses, no thyromegaly Respiratory: Looks fairly comfortable on 3 L of oxygen.  Has some conducted upper airway sounds. Cardiovascular: Regular rate and rhythm, no murmurs / rubs / gallops. No extremity edema.  2+ pedal pulses. No carotid bruits.  Abdomen: no tenderness, no masses palpated. No hepatosplenomegaly. Bowel sounds positive.  Musculoskeletal: no clubbing / cyanosis. No joint deformity upper and lower extremities. Good ROM, no contractures. Normal muscle tone.  Skin: no rashes, lesions, ulcers. No induration Neurologic: CN 2-12 grossly intact. Sensation intact, DTR normal. Strength 5/5 in all 4.  Psychiatric: Normal judgment and insight. Alert and oriented x 3. Normal mood.     Labs on Admission: I have personally reviewed following labs and imaging studies  CBC: Recent Labs  Lab 02/04/20 1059  WBC 8.2  NEUTROABS 6.5  HGB 11.2*  HCT 34.7*  MCV 88.3  PLT 253  Basic Metabolic Panel: Recent Labs  Lab 02/04/20 1059  NA 133*  K 3.8  CL 101  CO2 23  GLUCOSE 74  BUN 15  CREATININE 0.60  CALCIUM 8.2*   GFR: CrCl cannot be calculated (Unknown ideal weight.). Liver Function Tests: Recent Labs  Lab 02/04/20 1059  AST 36  ALT 28  ALKPHOS 366*  BILITOT 0.7  PROT 7.1  ALBUMIN 3.9   No results for input(s): LIPASE, AMYLASE in the last 168 hours. No results for input(s): AMMONIA in the last 168 hours. Coagulation Profile: No results for input(s): INR, PROTIME in the last 168 hours. Cardiac Enzymes: No results for input(s): CKTOTAL, CKMB, CKMBINDEX, TROPONINI in the last 168 hours. BNP (last 3 results) No results for input(s): PROBNP in the last 8760 hours. HbA1C: No results for input(s): HGBA1C in the last 72 hours. CBG: No results for input(s): GLUCAP in the last 168 hours. Lipid Profile: Recent Labs    02/04/20 1059  TRIG 55   Thyroid Function Tests: No results for input(s): TSH, T4TOTAL, FREET4, T3FREE, THYROIDAB in the last 72 hours. Anemia Panel: Recent Labs    02/04/20 1059  FERRITIN 53   Urine analysis:    Component Value Date/Time   COLORURINE YELLOW 03/01/2019 0112   APPEARANCEUR CLEAR 03/01/2019 0112   LABSPEC 1.020 03/01/2019 0112    PHURINE 6.0 03/01/2019 0112   GLUCOSEU NEGATIVE 03/01/2019 0112   HGBUR NEGATIVE 03/01/2019 0112   BILIRUBINUR NEGATIVE 03/01/2019 0112   KETONESUR 20 (A) 03/01/2019 0112   PROTEINUR NEGATIVE 03/01/2019 0112   UROBILINOGEN 1.0 07/12/2012 1842   NITRITE NEGATIVE 03/01/2019 0112   LEUKOCYTESUR NEGATIVE 03/01/2019 0112    Radiological Exams on Admission: DG Chest Port 1 View  Result Date: 02/04/2020 CLINICAL DATA:  Cough and fever, history of lung cancer EXAM: PORTABLE CHEST 1 VIEW COMPARISON:  02/19/2019 FINDINGS: Postop changes in the left upper lobe with parenchymal scarring. Similar posterior eventration of the right hemidiaphragm. No definite acute airspace process, collapse or consolidation. Negative for edema, large effusion or pneumothorax. Trachea midline. Borderline heart size. Aorta atherosclerotic. No acute osseous finding. IMPRESSION: Stable postoperative findings. No superimposed acute process by plain radiography Aortic Atherosclerosis (ICD10-I70.0). Electronically Signed   By: Jerilynn Mages.  Shick M.D.   On: 02/04/2020 10:59    EKG: Independently reviewed.  Normal sinus rhythm, QTC 454.  No acute ST-T wave changes.  Assessment/Plan Principal Problem:   Pneumonia due to COVID-19 virus Active Problems:   COPD with asthma ([place])   Physical deconditioning   Malignant neoplasm of upper lobe of left lung ([place])     1.  COVID-19 virus infection, COPD exacerbation: Acute on chronic hypoxemic respiratory failure. Agree with monitoring in the hospital given significant symptoms. chest physiotherapy, incentive spirometry, deep breathing exercises, sputum induction, mucolytic's and bronchodilators. Supplemental oxygen to keep saturations more than 90%. Covid directed therapy with , steroids, patient has advanced COPD and now with shortness of breath, will treat with IV steroids. remdesivir, she is needing additional oxygen and has tachypnea and tachycardia.  Will treat with remdesivir.  Her  chest x-ray is not reliable because of presence of previous scars. antibiotics, not indicated, procalcitonin normal. Due to severity of symptoms, patient will need daily inflammatory markers, chest x-rays, liver function test to monitor and direct COVID-19 therapies.  2.  Physical deconditioning: She needs to work with PT OT.  Lives at home by herself.  Will need mobilization and safety disposition plan.  3.  Lung cancer: Status post radiation  therapy.  Under surveillance.  Followed by pulmonology.  4.  History of depression: She is on Zoloft that she will continue.  DVT prophylaxis: Lovenox subcu Code Status: Full code Family Communication: Daughter-in-law on the phone Disposition Plan: We will need PT evaluation and mobility before discharge. Consults called: None Admission status: Observation, MedSurg.   Barb Merino MD [facility] Pager [phone]

## 2020-02-04 NOTE — ED Notes (Signed)
On 02/04/20 at 1523 Remdesivir 200 mg was ordered. This 200 mg bag was not administered.   On 02/04/20 at 1655 and 1742 Remdesivir 100 mg bags were administered.  These two Remdesivir 100 mg bags were administered instead of the Remdesivir 200 mg bag.   On 02/05/20 at 10:00 AM Remdesivir 100 mg is due.

## 2020-02-04 NOTE — ED Triage Notes (Signed)
Pt arrives EMS from home. Pt reports that she was exposed to [place] last week. Pt reports HA, body aches and sore throat. Pt is in no distress. Pt denies any new SHOB. Pt states her PCP advised she get tested. Pt states she doesn't have transportation so she called EMS to bring her to the hospital for testing. Pt is on 2L o2 at baseline.

## 2020-02-04 NOTE — ED Notes (Signed)
Notified Crystal, RN that pt resulted Covid +

## 2020-02-04 NOTE — ED Provider Notes (Signed)
[facility] DEPT Provider Note   CSN: [id number] Arrival date & time: 02/04/20  1007     History No chief complaint on file.   Lori Butler is a 69 y.o. female.  HPI    69 year old female history of acute respiratory failure, on 2 L nasal cannula at baseline, COPD, lung cancer, presents today with cough, dyspnea, and fever.  Patient had a family in last week began having Covid symptoms on Friday while visiting her.  They tested positive for Covid.  Was vaccinated for Covid with positive vaccine last April.  Patient began having some symptoms yesterday.  She has had some sore throat, headache, body aches, cough, and fever.  She had one episode of diarrhea.  She has not had nausea or vomiting.  Patient spoke with her primary care physician and came in via EMS for evaluation.  She has Past Medical History:  Diagnosis Date  . Acute respiratory failure with hypoxia ([place])   . Anxiety   . Aortic atherosclerosis ([place])   . Arthritis    "a little in my hands probably" (10/31/2017)  . Asthma   . COPD (chronic obstructive pulmonary disease) ([place])   . COPD exacerbation ([place])   . Depression   . History of kidney stones   . lung ca dx'd 10/2018  . Suicide attempt ([place]) 02/21/2018   overdosed on Wellbutrin    Patient Active Problem List   Diagnosis Date Noted  . COPD with acute exacerbation ([place]) 02/28/2019  . Cellulitis of right leg 02/28/2019  . Malignant neoplasm of upper lobe of left lung ([place]) 11/14/2018  . Solitary pulmonary nodule on lung CT 10/22/2018  . Anxiety and depression 05/05/2018  . Thrush, oral 04/15/2018  . 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 Attempted suicide ([place]) 02/21/2018  . Hypoxia   . Physical deconditioning   . Vitamin B12 deficiency 11/10/2017  . Osteoporosis 11/10/2017  . Vitamin D deficiency 11/10/2017  . Tobacco abuse 09/20/2017  . COPD with asthma ([place]) 09/15/2017     Past Surgical History:  Procedure Laterality Date  . ABDOMINAL HYSTERECTOMY    . APPENDECTOMY    . BREAST BIOPSY Left 1990s  . CATARACT EXTRACTION W/ INTRAOCULAR LENS  IMPLANT, BILATERAL Bilateral   . DILATION AND CURETTAGE OF UTERUS    . FUDUCIAL PLACEMENT N/A 11/11/2018   Procedure: PLACEMENT OF FUDUCIAL;  Surgeon: Melrose Nakayama, MD;  Location: [facility];  Service: Thoracic;  Laterality: N/A;  . TONSILLECTOMY    . TUBAL LIGATION    . VIDEO BRONCHOSCOPY WITH ENDOBRONCHIAL NAVIGATION N/A 11/11/2018   Procedure: VIDEO BRONCHOSCOPY WITH ENDOBRONCHIAL NAVIGATION;  Surgeon: Melrose Nakayama, MD;  Location: [facility];  Service: Thoracic;  Laterality: N/A;     OB History   No obstetric history on file.     Family History  Problem Relation Age of Onset  . Breast cancer Mother     Social History   Tobacco Use  . Smoking status: Former Smoker    Packs/day: 1.50    Years: 51.00    Pack years: 76.50    Types: Cigarettes    Quit date: 09/05/2017    Years since quitting: 2.4  . Smokeless tobacco: Never Used  Vaping Use  . Vaping Use: Never used  Substance Use Topics  . Alcohol use: Not Currently    Comment: 02/21/2018 "couple drinks/year"  . Drug use: Never    Home Medications Prior to Admission medications  Medication Sig Start Date End Date Taking? Authorizing Provider  albuterol (PROVENTIL HFA;VENTOLIN HFA) 108 (90 Base) MCG/ACT inhaler Inhale 2 puffs into the lungs every 6 (six) hours as needed for wheezing or shortness of breath. 06/18/18   Rigoberto Noel, MD  albuterol (PROVENTIL) (2.5 MG/3ML) 0.083% nebulizer solution Take 3 mLs (2.5 mg total) by nebulization every 6 (six) hours as needed for wheezing or shortness of breath. 06/21/18   Rigoberto Noel, MD  alendronate (FOSAMAX) 70 MG tablet Take 70 mg by mouth every Wednesday.  07/11/17   [provider]  atorvastatin (LIPITOR) 10 MG tablet Take 10 mg by mouth daily.    [provider]  Cholecalciferol  (VITAMIN D) 50 MCG (2000 UT) tablet Take 2,000 Units by mouth daily.    [provider]  Fluticasone-Umeclidin-Vilant (TRELEGY ELLIPTA) 200-62.5-25 MCG/INH AEPB INHALE ONE PUFF INTO THE LUNGS EVERY DAY 12/05/19   Martyn Ehrich, NP  guaiFENesin (MUCINEX) 600 MG 12 hr tablet Take 2 tablets (1,200 mg total) by mouth 2 (two) times daily. Patient taking differently: Take 600 mg by mouth 2 (two) times daily.  02/06/19   Martyn Ehrich, NP  montelukast (SINGULAIR) 10 MG tablet Take 1 tablet (10 mg total) by mouth at bedtime. 12/26/17   Arnell Asal, NP  naproxen (NAPROSYN) 375 MG tablet Take 1 tablet (375 mg total) by mouth 2 (two) times daily. 02/19/19   Nat Christen, MD  pantoprazole (PROTONIX) 40 MG tablet Take 1 tablet (40 mg total) by mouth 2 (two) times daily. Patient taking differently: Take 40 mg by mouth daily.  12/26/17   Arnell Asal, NP  predniSONE (DELTASONE) 10 MG tablet Take 4 tabs po daily x 2 days; then 3 tabs for 2 days; then 2 tabs for 2 days; then 1 tab for 2 days Patient not taking: Reported on 12/30/2019 12/05/19   Martyn Ehrich, NP  sertraline (ZOLOFT) 50 MG tablet Take 1 tablet (50 mg total) by mouth daily. 03/01/18   McNew, Tyson Babinski, MD    Allergies    Demerol [meperidine], Oxycodone-acetaminophen, and Tramadol  Review of Systems   Review of Systems  All other systems reviewed and are negative.   Physical Exam Updated Vital Signs There were no vitals taken for this visit.  Physical Exam Vitals and nursing note reviewed.  Constitutional:      General: She is not in acute distress.    Appearance: Normal appearance. She is not ill-appearing.  HENT:     Head: Normocephalic.     Right Ear: External ear normal.     Left Ear: External ear normal.     Nose: Nose normal.     Mouth/Throat:     Mouth: Mucous membranes are moist.  Eyes:     Pupils: Pupils are equal, round, and reactive to light.  Cardiovascular:     Rate and Rhythm: Tachycardia  present.  Pulmonary:     Effort: No respiratory distress.     Breath sounds: No stridor. No wheezing, rhonchi or rales.     Comments: Tachypneic with increased work of breathing Abdominal:     General: Abdomen is flat.     Palpations: Abdomen is soft.  Musculoskeletal:        General: No swelling, tenderness or deformity. Normal range of motion.     Cervical back: Normal range of motion.     Right lower leg: No edema.     Left lower leg: No edema.  Skin:  General: Skin is warm and dry.     Capillary Refill: Capillary refill takes less than 2 seconds.  Neurological:     General: No focal deficit present.     Mental Status: She is alert.  Psychiatric:        Mood and Affect: Mood normal.     ED Results / Procedures / Treatments   Labs (all labs ordered are listed, but only abnormal results are displayed) Labs Reviewed  RESPIRATORY PANEL BY RT PCR (FLU A&B, COVID)  CULTURE, BLOOD (ROUTINE X 2)  CULTURE, BLOOD (ROUTINE X 2)  LACTIC ACID, PLASMA  LACTIC ACID, PLASMA  CBC WITH DIFFERENTIAL/PLATELET  COMPREHENSIVE METABOLIC PANEL  D-DIMER, QUANTITATIVE (NOT AT [facility])  PROCALCITONIN  LACTATE DEHYDROGENASE  FERRITIN  TRIGLYCERIDES  FIBRINOGEN  C-REACTIVE PROTEIN    EKG None  Radiology No results found.  Procedures Procedures (including critical care time)  Medications Ordered in ED Medications - No data to display  ED Course  I have reviewed the triage vital signs and the nursing notes.  Pertinent labs & imaging results that were available during my care of the patient were reviewed by me and considered in my medical decision making (see chart for details).    MDM Rules/Calculators/A&P                          69 year old female with history of COPD, hypoxic respiratory failure, on chronic oxygen presents with recent exposure to Covid and cough, and congestion.  Here she is Covid positive.  She is tachycardic and tachypneic.  With oxygen at 3 L, she continues  to be tachypneic to 36 breaths/min.  Discussed patient care with Dr. Sloan Leiter and he will see for admission Destinae Neubecker Riester was evaluated in Emergency Department on 02/04/2020 for the symptoms described in the history of present illness. She was evaluated in the context of the global COVID-19 pandemic, which necessitated consideration that the patient might be at risk for infection with the SARS-CoV-2 virus that causes COVID-19. Institutional protocols and algorithms that pertain to the evaluation of patients at risk for COVID-19 are in a state of rapid change based on information released by regulatory bodies including the CDC and federal and state organizations. These policies and algorithms were followed during the patient's care in the ED.  Final Clinical Impression(s) / ED Diagnoses Final diagnoses:  [id number]    Rx / DC Orders ED Discharge Orders    None       Pattricia Boss, MD 02/04/20 1500

## 2020-02-05 DIAGNOSIS — J1282 Pneumonia due to coronavirus disease 2019: Secondary | ICD-10-CM | POA: Diagnosis not present

## 2020-02-05 DIAGNOSIS — M810 Age-related osteoporosis without current pathological fracture: Secondary | ICD-10-CM | POA: Diagnosis present

## 2020-02-05 DIAGNOSIS — Z923 Personal history of irradiation: Secondary | ICD-10-CM | POA: Diagnosis not present

## 2020-02-05 DIAGNOSIS — Z7983 Long term (current) use of bisphosphonates: Secondary | ICD-10-CM | POA: Diagnosis not present

## 2020-02-05 DIAGNOSIS — Z79899 Other long term (current) drug therapy: Secondary | ICD-10-CM | POA: Diagnosis not present

## 2020-02-05 DIAGNOSIS — F339 Major depressive disorder, recurrent, unspecified: Secondary | ICD-10-CM | POA: Diagnosis present

## 2020-02-05 DIAGNOSIS — Z87891 Personal history of nicotine dependence: Secondary | ICD-10-CM | POA: Diagnosis not present

## 2020-02-05 DIAGNOSIS — J9621 Acute and chronic respiratory failure with hypoxia: Secondary | ICD-10-CM | POA: Diagnosis present

## 2020-02-05 DIAGNOSIS — Z803 Family history of malignant neoplasm of breast: Secondary | ICD-10-CM | POA: Diagnosis not present

## 2020-02-05 DIAGNOSIS — U071 COVID-19: Secondary | ICD-10-CM | POA: Diagnosis present

## 2020-02-05 DIAGNOSIS — Z85118 Personal history of other malignant neoplasm of bronchus and lung: Secondary | ICD-10-CM | POA: Diagnosis not present

## 2020-02-05 DIAGNOSIS — Z7951 Long term (current) use of inhaled steroids: Secondary | ICD-10-CM | POA: Diagnosis not present

## 2020-02-05 DIAGNOSIS — J441 Chronic obstructive pulmonary disease with (acute) exacerbation: Secondary | ICD-10-CM | POA: Diagnosis present

## 2020-02-05 DIAGNOSIS — Z9981 Dependence on supplemental oxygen: Secondary | ICD-10-CM | POA: Diagnosis not present

## 2020-02-05 DIAGNOSIS — C3412 Malignant neoplasm of upper lobe, left bronchus or lung: Secondary | ICD-10-CM | POA: Diagnosis present

## 2020-02-05 LAB — C-REACTIVE PROTEIN: CRP: 7.5 mg/dL — ABNORMAL HIGH (ref <1.0)

## 2020-02-05 LAB — FERRITIN: Ferritin: 87 ng/mL (ref 11–307)

## 2020-02-05 MED ORDER — AZITHROMYCIN 250 MG PO TABS
500.0000 mg | ORAL_TABLET | Freq: Every day | ORAL | Status: DC
  Administered 2020-02-05 – 2020-02-08 (×4): 500 mg via ORAL
  Filled 2020-02-05 (×4): qty 2

## 2020-02-05 MED ORDER — LOPERAMIDE HCL 2 MG PO CAPS
2.0000 mg | ORAL_CAPSULE | ORAL | Status: DC
  Administered 2020-02-05 – 2020-02-06 (×5): 2 mg via ORAL
  Filled 2020-02-05 (×5): qty 1

## 2020-02-05 MED ORDER — METHYLPREDNISOLONE SODIUM SUCC 40 MG IJ SOLR
40.0000 mg | Freq: Every day | INTRAMUSCULAR | Status: DC
  Administered 2020-02-06 – 2020-02-08 (×3): 40 mg via INTRAVENOUS
  Filled 2020-02-05 (×3): qty 1

## 2020-02-05 NOTE — Plan of Care (Signed)
Plan of care discussed.   

## 2020-02-05 NOTE — Progress Notes (Signed)
PROGRESS NOTE    Lori Butler  YIR:[id number] DOB: [birth year] DOA: 02/04/2020 PCP: [name], MD    Brief Narrative:  69 year old female with history of COPD with 70-pack-year smoking history on 2 L oxygen at home, lung tumor status post radiation treatment, history of depression and limited mobility presented to ER with 2 days of sore throat, headache, body ache and fever with positive COVID-19 exposure. In the emergency room tachycardic, tachypneic with respiratory 33.  Blood pressures normal.  Electrolytes normal.  Procalcitonin normal.  Chest x-ray mostly with old scars.  On 2 L oxygen at baseline.  Was needing more than 3 L of oxygen.  COVID-19 was positive.  Admitted to the hospital with significant symptoms including COPD exacerbation due to COVID-19 virus infection.   Assessment & Plan:   Principal Problem:   Pneumonia due to COVID-19 virus Active Problems:   COPD with asthma ([place])   Physical deconditioning   Malignant neoplasm of upper lobe of left lung (HCC)   COPD with acute exacerbation (HCC)  COPD with acute exacerbation/probably aggravated by COVID-19 virus infection in a patient with underlying lung disease and lung cancer: Patient with significant symptoms including productive cough, increased oxygen demand.  Will treat as COPD exacerbation. Aggressive bronchodilator therapy, IV steroids, inhalational steroids, scheduled and as needed bronchodilators, deep breathing exercises, incentive spirometry, chest physiotherapy and respiratory therapy consult. Patient has mucoid sputum production change from baseline, will treat with azithromycin for 5 days. Supplemental oxygen to keep saturations more than 90%.  COVID-19 virus infection: With no evidence of obvious pneumonia on chest x-ray.  Patient presented with COPD exacerbation.  Will treat with remdesivir for 5 days because of hypoxia.  Already on steroids.  Continue other supportive treatments.  Profound physical  deconditioning: Needs to work with PT OT before returning home.  History of depression: On Zoloft.   DVT prophylaxis: enoxaparin (LOVENOX) injection 40 mg Start: 02/04/20 2200   Code Status: Full code Family Communication: Daughter-in-law on the phone Disposition Plan: Status is: Inpatient  Remains inpatient appropriate because:IV treatments appropriate due to intensity of illness or inability to take PO and Inpatient level of care appropriate due to severity of illness   Dispo: The patient is from: Home              Anticipated d/c is to: Home              Anticipated d/c date is: 2 days              Patient currently is not medically stable to d/c.  Patient with significant symptoms with shortness of breath, unable to mobilize in the context of COVID-19 viral infection causing COPD exacerbation.  She will need hospitalization and treatment and anticipate to stay in the hospital for more than 2 midnights.  She will be admitted to the hospital.     Consultants:   None  Procedures:   None  Antimicrobials:  Anti-infectives (From admission, onward)   Start     Dose/Rate Route Frequency Ordered Stop   02/05/20 1245  azithromycin (ZITHROMAX) tablet 500 mg        500 mg Oral Daily 02/05/20 1232 02/10/20 0959   02/05/20 1000  remdesivir 100 mg in sodium chloride 0.9 % 100 mL IVPB        100 mg 200 mL/hr over 30 Minutes Intravenous Daily 02/04/20 1520 02/09/20 0959   02/04/20 1715  remdesivir 100 mg in sodium chloride 0.9 % 100 mL IVPB  100 mg 200 mL/hr over 30 Minutes Intravenous Every 30 min 02/04/20 1706 02/04/20 1814   02/04/20 1600  remdesivir 200 mg in sodium chloride 0.9% 250 mL IVPB  Status:  Discontinued        200 mg 580 mL/hr over 30 Minutes Intravenous Once 02/04/20 1520 02/04/20 1706         Subjective: Patient seen and examined.  No overnight events.  She does feel somehow comfortable today.  Has not been out of the stretcher from ER.  Has increased  cough and mucoid sputum production.  Afebrile overnight.  Objective: Vitals:   02/05/20 0815 02/05/20 0958 02/05/20 1015 02/05/20 1156  BP: (!) 127/59 (!) 93/55 131/65 116/67  Pulse: 66 65 64 65  Resp:  (!) 22  16  Temp:      TempSrc:      SpO2: 98% 99% 99% 99%    Intake/Output Summary (Last 24 hours) at 02/05/2020 1233 Last data filed at 02/05/2020 1202 Gross per 24 hour  Intake 291.65 ml  Output --  Net 291.65 ml   There were no vitals filed for this visit.  Examination:  General exam: Chronically sick looking, looks fairly comfortable today on 3 L of oxygen. Respiratory system: No added sounds. Cardiovascular system: S1 & S2 heard, RRR. No JVD, murmurs, rubs, gallops or clicks. No pedal edema. Gastrointestinal system: Abdomen is nondistended, soft and nontender. No organomegaly or masses felt. Normal bowel sounds heard. Central nervous system: Alert and oriented. No focal neurological deficits. Extremities: Symmetric 5 x 5 power. Skin: No rashes, lesions or ulcers Psychiatry: Judgement and insight appear normal. Mood & affect appropriate.     Data Reviewed: I have personally reviewed following labs and imaging studies  CBC: Recent Labs  Lab 02/04/20 1059  WBC 8.2  NEUTROABS 6.5  HGB 11.2*  HCT 34.7*  MCV 88.3  PLT 222   Basic Metabolic Panel: Recent Labs  Lab 02/04/20 1059  NA 133*  K 3.8  CL 101  CO2 23  GLUCOSE 74  BUN 15  CREATININE 0.60  CALCIUM 8.2*   GFR: CrCl cannot be calculated (Unknown ideal weight.). Liver Function Tests: Recent Labs  Lab 02/04/20 1059  AST 36  ALT 28  ALKPHOS 366*  BILITOT 0.7  PROT 7.1  ALBUMIN 3.9   No results for input(s): LIPASE, AMYLASE in the last 168 hours. No results for input(s): AMMONIA in the last 168 hours. Coagulation Profile: No results for input(s): INR, PROTIME in the last 168 hours. Cardiac Enzymes: No results for input(s): CKTOTAL, CKMB, CKMBINDEX, TROPONINI in the last 168 hours. BNP (last  3 results) No results for input(s): PROBNP in the last 8760 hours. HbA1C: No results for input(s): HGBA1C in the last 72 hours. CBG: No results for input(s): GLUCAP in the last 168 hours. Lipid Profile: Recent Labs    02/04/20 1059  TRIG 55   Thyroid Function Tests: No results for input(s): TSH, T4TOTAL, FREET4, T3FREE, THYROIDAB in the last 72 hours. Anemia Panel: Recent Labs    02/04/20 1059  FERRITIN 53   Sepsis Labs: Recent Labs  Lab 02/04/20 1059 02/04/20 1304  PROCALCITON <0.10  --   LATICACIDVEN 0.7 0.6    Recent Results (from the past 240 hour(s))  Respiratory Panel by RT PCR (Flu A&B, Covid) - Nasopharyngeal Swab     Status: Abnormal   Collection Time: 02/04/20 10:59 AM   Specimen: Nasopharyngeal Swab  Result Value Ref Range Status   SARS Coronavirus 2 by  RT PCR POSITIVE (A) NEGATIVE Final    Comment: RESULT CALLED TO, READ BACK BY AND VERIFIED WITH: CLAPP,S. RN @1412  ON 09.29.2021 BY COHEN,K    Influenza A by PCR NEGATIVE NEGATIVE Final   Influenza B by PCR NEGATIVE NEGATIVE Final    Comment: Performed at [facility], [place] [address], [place], [place] [address]  Blood Culture (routine x 2)     Status: None (Preliminary result)   Collection Time: 02/04/20 10:59 AM   Specimen: BLOOD  Result Value Ref Range Status   Specimen Description   Final    BLOOD LEFT ANTECUBITAL Performed at [facility] [address] [place], [place] [address]    Special Requests   Final    BOTTLES DRAWN AEROBIC AND ANAEROBIC Blood Culture adequate volume Performed at [facility] [address], [place], [place] [address]    Culture   Final    NO GROWTH < 24 HOURS Performed at [facility] [address] [place], [place] [address]    Report Status PENDING  Incomplete  Blood Culture (routine x 2)     Status: None (Preliminary result)   Collection Time: 02/04/20 10:59 AM   Specimen: BLOOD  Result Value Ref  Range Status   Specimen Description   Final    BLOOD BLOOD RIGHT HAND Performed at [facility] [address], [place], [place] [address]    Special Requests   Final    BOTTLES DRAWN AEROBIC AND ANAEROBIC Blood Culture adequate volume Performed at [facility] [address] [place], [place] [address]    Culture   Final    NO GROWTH < 24 HOURS Performed at [facility] [address], [place], [place] [address]    Report Status PENDING  Incomplete         Radiology Studies: DG Chest Port 1 View  Result Date: 02/04/2020 CLINICAL DATA:  Cough and fever, history of lung cancer EXAM: PORTABLE CHEST 1 VIEW COMPARISON:  02/19/2019 FINDINGS: Postop changes in the left upper lobe with parenchymal scarring. Similar posterior eventration of the right hemidiaphragm. No definite acute airspace process, collapse or consolidation. Negative for edema, large effusion or pneumothorax. Trachea midline. Borderline heart size. Aorta atherosclerotic. No acute osseous finding. IMPRESSION: Stable postoperative findings. No superimposed acute process by plain radiography Aortic Atherosclerosis (ICD10-I70.0). Electronically Signed   By: Jerilynn Mages.  Shick M.D.   On: 02/04/2020 10:59        Scheduled Meds: . vitamin C  500 mg Oral Daily  . atorvastatin  10 mg Oral Daily  . azithromycin  500 mg Oral Daily  . cholecalciferol  2,000 Units Oral Daily  . enoxaparin (LOVENOX) injection  40 mg Subcutaneous Q24H  . guaiFENesin  1,200 mg Oral BID  . [START ON 02/06/2020] methylPREDNISolone (SOLU-MEDROL) injection  40 mg Intravenous Daily  . montelukast  10 mg Oral QHS  . pantoprazole  40 mg Oral Daily  . sertraline  50 mg Oral Daily  . zinc sulfate  220 mg Oral Daily   Continuous Infusions: . remdesivir 100 mg in NS 100 mL Stopped (02/05/20 1141)     LOS: 0 days    Time spent: 35 minutes    Barb Merino, MD [facility] Pager [phone]

## 2020-02-06 LAB — CBC WITH DIFFERENTIAL/PLATELET
Abs Immature Granulocytes: 0.02 10*3/uL (ref 0.00–0.07)
Basophils Absolute: 0 10*3/uL (ref 0.0–0.1)
Basophils Relative: 0 %
Eosinophils Absolute: 0 10*3/uL (ref 0.0–0.5)
Eosinophils Relative: 0 %
HCT: 35.4 % — ABNORMAL LOW (ref 36.0–46.0)
Hemoglobin: 11.5 g/dL — ABNORMAL LOW (ref 12.0–15.0)
Immature Granulocytes: 0 %
Lymphocytes Relative: 35 %
Lymphs Abs: 2.2 10*3/uL (ref 0.7–4.0)
MCH: 28.3 pg (ref 26.0–34.0)
MCHC: 32.5 g/dL (ref 30.0–36.0)
MCV: 87 fL (ref 80.0–100.0)
Monocytes Absolute: 1 10*3/uL (ref 0.1–1.0)
Monocytes Relative: 16 %
Neutro Abs: 3 10*3/uL (ref 1.7–7.7)
Neutrophils Relative %: 49 %
Platelets: 192 10*3/uL (ref 150–400)
RBC: 4.07 MIL/uL (ref 3.87–5.11)
RDW: 13.5 % (ref 11.5–15.5)
WBC: 6.3 10*3/uL (ref 4.0–10.5)
nRBC: 0 % (ref 0.0–0.2)

## 2020-02-06 LAB — COMPREHENSIVE METABOLIC PANEL
ALT: 30 U/L (ref 0–44)
AST: 38 U/L (ref 15–41)
Albumin: 3.4 g/dL — ABNORMAL LOW (ref 3.5–5.0)
Alkaline Phosphatase: 319 U/L — ABNORMAL HIGH (ref 38–126)
Anion gap: 11 (ref 5–15)
BUN: 29 mg/dL — ABNORMAL HIGH (ref 8–23)
CO2: 22 mmol/L (ref 22–32)
Calcium: 8.3 mg/dL — ABNORMAL LOW (ref 8.9–10.3)
Chloride: 105 mmol/L (ref 98–111)
Creatinine, Ser: 0.78 mg/dL (ref 0.44–1.00)
GFR calc Af Amer: >60 mL/min (ref >60)
GFR calc non Af Amer: >60 mL/min (ref >60)
Glucose, Bld: 89 mg/dL (ref 70–99)
Potassium: 3.9 mmol/L (ref 3.5–5.1)
Sodium: 138 mmol/L (ref 135–145)
Total Bilirubin: 0.6 mg/dL (ref 0.3–1.2)
Total Protein: 6.7 g/dL (ref 6.5–8.1)

## 2020-02-06 LAB — PHOSPHORUS: Phosphorus: 3.1 mg/dL (ref 2.5–4.6)

## 2020-02-06 LAB — MAGNESIUM: Magnesium: 2.2 mg/dL (ref 1.7–2.4)

## 2020-02-06 LAB — D-DIMER, QUANTITATIVE: D-Dimer, Quant: 1.03 ug/mL-FEU — ABNORMAL HIGH (ref 0.00–0.50)

## 2020-02-06 MED ORDER — SODIUM CHLORIDE 0.9 % IV SOLN
INTRAVENOUS | Status: DC | PRN
  Administered 2020-02-06: 250 mL via INTRAVENOUS

## 2020-02-06 NOTE — Evaluation (Signed)
Occupational Therapy Evaluation Patient Details Name: Lori Butler MRN: [id number] DOB: 1951/03/27 Today's Date: 02/06/2020    History of Present Illness 69 year old female with history of COPD with 70-pack-year smoking history on 2 L oxygen at home, lung tumor status post radiation treatment, history of depression and limited mobility presented to ER with 2 days of sore throat, headache, body ache and fever with positive COVID-19 exposure.Admitted to the hospital with significant symptoms including COPD exacerbation due to COVID-19 virus infection.   Clinical Impression   Lori Butler is a 69 year old woman with above medical history who presents supine in bed on 3 L [place]. ON evaluation she demonstrates generalized weakness, decreased activity tolerance and impaired cardiopulmonary status resulting in a decline in functional abilities. Patient required min guard for ADLs and ambulation in room with RW. Patient's o2 sat maintained above 90% with ambulation on 3 L [place] but HR up to 145 with ambulation to bathroom and standing at sink. Patient instructed on use of breathing device, positioning and activities to perform while seated to maintain strength and endurance. Patient will benefit from skilled OT services while in hospital to improve deficits, learn compensatory strategies, learn breathing techniques and activities in order to recover from COVID symptoms and return home at discharge.     Follow Up Recommendations  No OT follow up    Equipment Recommendations  None recommended by OT    Recommendations for Other Services       Precautions / Restrictions        Mobility Bed Mobility Overal bed mobility: Modified Independent                Transfers Overall transfer level: Needs assistance   Transfers: Sit to/from Stand Sit to Stand: Min guard         General transfer comment: Min guard with RW to ambulate in room.    Balance Overall balance assessment: No  apparent balance deficits (not formally assessed)                                         ADL either performed or assessed with clinical judgement   ADL Overall ADL's : Needs assistance/impaired Eating/Feeding: Independent   Grooming: Standing;Oral care;Wash/dry face;Brushing hair;Min guard   Upper Body Bathing: Min guard   Lower Body Bathing: Min guard   Upper Body Dressing : Min guard   Lower Body Dressing: Min guard;Sit to/from stand   Toilet Transfer: Min guard   Toileting- Water quality scientist and Hygiene: Min guard               Vision Baseline Vision/History: Wears glasses Wears Glasses: At all times       Perception     Praxis      Pertinent Vitals/Pain Pain Assessment: No/denies pain     Hand Dominance Right   Extremity/Trunk Assessment Upper Extremity Assessment Upper Extremity Assessment: Generalized weakness (shoulder ROM approx 90 degrees bilaterally. 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)   Lower Extremity Assessment Lower Extremity Assessment: Defer to PT evaluation   Cervical / Trunk Assessment Cervical / Trunk Assessment: Kyphotic   Communication Communication Communication: No difficulties   Cognition Arousal/Alertness: Awake/alert Behavior During Therapy: WFL for tasks assessed/performed Overall Cognitive Status: Within Functional Limits for tasks assessed  General Comments       Exercises     Shoulder Instructions      Home Living Family/patient expects to be discharged to:: Private residence Living Arrangements: Alone Available Help at Discharge: Family;Available PRN/intermittently Type of Home: Apartment Home Access: Stairs to enter Entrance Stairs-Number of Steps: 3 Entrance Stairs-Rails: None Home Layout: One level     Bathroom Shower/Tub: Tub/shower unit;Curtain   Biochemist, clinical: Standard     Home Equipment: Grab bars - tub/shower;Shower  seat;Walker - 4 wheels   Additional Comments: Uses rollator when cooking and doing laundry.      Prior Functioning/Environment Level of Independence: Independent with assistive device(s)                 OT Problem List: Decreased activity tolerance;Decreased strength;Cardiopulmonary status limiting activity      OT Treatment/Interventions: Self-care/ADL training;Energy conservation;DME and/or AE instruction;Therapeutic activities;Patient/family education    OT Goals(Current goals can be found in the care plan section) Acute Rehab OT Goals Patient Stated Goal: to get stronger OT Goal Formulation: With patient Time For Goal Achievement: 02/20/20 Potential to Achieve Goals: Good  OT Frequency: Min 2X/week   Barriers to D/C:            Co-evaluation              AM-PAC OT "6 Clicks" Daily Activity     Outcome Measure Help from another person eating meals?: None Help from another person taking care of personal grooming?: A Little Help from another person toileting, which includes using toliet, bedpan, or urinal?: A Little Help from another person bathing (including washing, rinsing, drying)?: A Little Help from another person to put on and taking off regular upper body clothing?: A Little Help from another person to put on and taking off regular lower body clothing?: A Little 6 Click Score: 19   End of Session Equipment Utilized During Treatment: Rolling walker;Oxygen Nurse Communication: Mobility status  Activity Tolerance: Patient tolerated treatment well Patient left: in chair;with call bell/phone within reach  OT Visit Diagnosis: Muscle weakness (generalized) (M62.81)                Time: 0488-8916 OT Time Calculation (min): 25 min Charges:  OT General Charges $OT Visit: 1 Visit OT Evaluation $OT Eval Low Complexity: 1 Low OT Treatments $Self Care/Home Management : 8-22 mins  Kaidin Boehle, OTR/L [place] [phone] Pager:  [place] 02/06/2020, 10:17 AM

## 2020-02-06 NOTE — Progress Notes (Signed)
PROGRESS NOTE    Lori Butler  ENI:[id number] DOB: 1951/04/13 DOA: 02/04/2020 PCP: [name], MD    Brief Narrative:  69 year old female with history of COPD with 70-pack-year smoking history on 2 L oxygen at home, lung tumor status post radiation treatment, history of depression and limited mobility presented to ER with 2 days of sore throat, headache, body ache and fever with positive COVID-19 exposure. In the emergency room tachycardic, tachypneic with respiratory 33.  Blood pressures normal.  Electrolytes normal.  Procalcitonin normal.  Chest x-ray mostly with old scars.  On 2 L oxygen at baseline.  Was needing more than 3 L of oxygen.  COVID-19 was positive.  Admitted to the hospital with significant symptoms including COPD exacerbation due to COVID-19 virus infection.   Assessment & Plan:   Principal Problem:   Pneumonia due to COVID-19 virus Active Problems:   COPD with asthma ([place])   Physical deconditioning   Malignant neoplasm of upper lobe of left lung (HCC)   COPD with acute exacerbation (HCC)  COPD with acute exacerbation/probably aggravated by COVID-19 virus infection in a patient with underlying lung disease and lung cancer: Patient with significant symptoms including productive cough, increased oxygen demand so treating as COPD exacerbation and COVID-19 pneumonia. Aggressive bronchodilator therapy, IV steroids, inhalational steroids, scheduled and as needed bronchodilators, deep breathing exercises, incentive spirometry, chest physiotherapy and respiratory therapy consult. Patient has mucoid sputum production change from baseline, will treat with azithromycin for 5 days. Supplemental oxygen to keep saturations more than 90%.  COVID-19 virus infection: With no evidence of obvious pneumonia on chest x-ray.  Patient presented with COPD exacerbation.  Will treat with remdesivir for 5 days because of hypoxia.  Already on steroids.  Continue other supportive  treatments.  Profound physical deconditioning: Needs to work with PT OT before returning home.  History of depression: On Zoloft.   DVT prophylaxis: enoxaparin (LOVENOX) injection 40 mg Start: 02/04/20 2200   Code Status: Full code Family Communication: Daughter-in-law on the phone Disposition Plan: Status is: Inpatient  Remains inpatient appropriate because:IV treatments appropriate due to intensity of illness or inability to take PO and Inpatient level of care appropriate due to severity of illness   Dispo: The patient is from: Home              Anticipated d/c is to: Home              Anticipated d/c date is: 2 days              Patient currently is not medically stable to d/c.   Consultants:   None  Procedures:   None  Antimicrobials:  Anti-infectives (From admission, onward)   Start     Dose/Rate Route Frequency Ordered Stop   02/05/20 1245  azithromycin (ZITHROMAX) tablet 500 mg        500 mg Oral Daily 02/05/20 1232 02/10/20 0959   02/05/20 1000  remdesivir 100 mg in sodium chloride 0.9 % 100 mL IVPB        100 mg 200 mL/hr over 30 Minutes Intravenous Daily 02/04/20 1520 02/06/20 1049   02/04/20 1715  remdesivir 100 mg in sodium chloride 0.9 % 100 mL IVPB        100 mg 200 mL/hr over 30 Minutes Intravenous Every 30 min 02/04/20 1706 02/04/20 1814   02/04/20 1600  remdesivir 200 mg in sodium chloride 0.9% 250 mL IVPB  Status:  Discontinued        200  mg 580 mL/hr over 30 Minutes Intravenous Once 02/04/20 1520 02/04/20 1706         Subjective: patient seen and examined.  No overnight events.  Okay at rest, does not tolerate any activities.  Afebrile overnight.  Objective: Vitals:   02/05/20 2249 02/06/20 0233 02/06/20 0518 02/06/20 1345  BP: (!) 117/59 (!) 110/55 (!) 104/56 119/64  Pulse: 75 83 81 88  Resp: 18 20 20 17   Temp: 97.9 F (36.6 C) 97.8 F (36.6 C) 98.7 F (37.1 C)   TempSrc:      SpO2: 97% 94% 95% 99%  Weight:      Height:         Intake/Output Summary (Last 24 hours) at 02/06/2020 1525 Last data filed at 02/06/2020 1400 Gross per 24 hour  Intake 528.06 ml  Output 300 ml  Net 228.06 ml   Filed Weights   02/05/20 1919  Weight: 79.5 kg    Examination:  General exam: Chronically sick looking, looks comfortable at rest.  Anxious and tachypneic on mobility. Respiratory system: No added sounds.  Mostly conducted airway sounds. Cardiovascular system: S1 & S2 heard, RRR. No JVD, murmurs, rubs, gallops or clicks. No pedal edema. Gastrointestinal system: Abdomen is nondistended, soft and nontender. No organomegaly or masses felt. Normal bowel sounds heard. Central nervous system: Alert and oriented. No focal neurological deficits. Extremities: Symmetric 5 x 5 power. Skin: No rashes, lesions or ulcers Psychiatry: Judgement and insight appear normal. Mood & affect appropriate.     Data Reviewed: I have personally reviewed following labs and imaging studies  CBC: Recent Labs  Lab 02/04/20 1059 02/06/20 0350  WBC 8.2 6.3  NEUTROABS 6.5 3.0  HGB 11.2* 11.5*  HCT 34.7* 35.4*  MCV 88.3 87.0  PLT 253 809   Basic Metabolic Panel: Recent Labs  Lab 02/04/20 1059 02/06/20 0350  NA 133* 138  K 3.8 3.9  CL 101 105  CO2 23 22  GLUCOSE 74 89  BUN 15 29*  CREATININE 0.60 0.78  CALCIUM 8.2* 8.3*  MG  --  2.2  PHOS  --  3.1   GFR: Estimated Creatinine Clearance: 68.2 mL/min (by C-G formula based on SCr of 0.78 mg/dL). Liver Function Tests: Recent Labs  Lab 02/04/20 1059 02/06/20 0350  AST 36 38  ALT 28 30  ALKPHOS 366* 319*  BILITOT 0.7 0.6  PROT 7.1 6.7  ALBUMIN 3.9 3.4*   No results for input(s): LIPASE, AMYLASE in the last 168 hours. No results for input(s): AMMONIA in the last 168 hours. Coagulation Profile: No results for input(s): INR, PROTIME in the last 168 hours. Cardiac Enzymes: No results for input(s): CKTOTAL, CKMB, CKMBINDEX, TROPONINI in the last 168 hours. BNP (last 3  results) No results for input(s): PROBNP in the last 8760 hours. HbA1C: No results for input(s): HGBA1C in the last 72 hours. CBG: No results for input(s): GLUCAP in the last 168 hours. Lipid Profile: Recent Labs    02/04/20 1059  TRIG 55   Thyroid Function Tests: No results for input(s): TSH, T4TOTAL, FREET4, T3FREE, THYROIDAB in the last 72 hours. Anemia Panel: Recent Labs    02/04/20 1059 02/05/20 1305  FERRITIN 53 87   Sepsis Labs: Recent Labs  Lab 02/04/20 1059 02/04/20 1304  PROCALCITON <0.10  --   LATICACIDVEN 0.7 0.6    Recent Results (from the past 240 hour(s))  Respiratory Panel by RT PCR (Flu A&B, Covid) - Nasopharyngeal Swab     Status: Abnormal  Collection Time: 02/04/20 10:59 AM   Specimen: Nasopharyngeal Swab  Result Value Ref Range Status   SARS Coronavirus 2 by RT PCR POSITIVE (A) NEGATIVE Final    Comment: RESULT CALLED TO, READ BACK BY AND VERIFIED WITH: CLAPP,S. RN @1412  ON 09.29.2021 BY COHEN,K    Influenza A by PCR NEGATIVE NEGATIVE Final   Influenza B by PCR NEGATIVE NEGATIVE Final    Comment: Performed at [facility], [place] [address], [place], [place] [address]  Blood Culture (routine x 2)     Status: None (Preliminary result)   Collection Time: 02/04/20 10:59 AM   Specimen: BLOOD  Result Value Ref Range Status   Specimen Description   Final    BLOOD LEFT ANTECUBITAL Performed at [facility] [address] [place], [place] [address]    Special Requests   Final    BOTTLES DRAWN AEROBIC AND ANAEROBIC Blood Culture adequate volume Performed at [facility] [address], [place], [place] [address]    Culture   Final    NO GROWTH 2 DAYS Performed at [facility] [address], [place], [place] [address]    Report Status PENDING  Incomplete  Blood Culture (routine x 2)     Status: None (Preliminary result)   Collection Time: 02/04/20 10:59 AM   Specimen: BLOOD   Result Value Ref Range Status   Specimen Description   Final    BLOOD BLOOD RIGHT HAND Performed at [facility] [address], [place], [place] [address]    Special Requests   Final    BOTTLES DRAWN AEROBIC AND ANAEROBIC Blood Culture adequate volume Performed at [facility] [address] [place], [place] [address]    Culture   Final    NO GROWTH 2 DAYS Performed at [facility] [address], [place],  [address]    Report Status PENDING  Incomplete         Radiology Studies: No results found.      Scheduled Meds: . vitamin C  500 mg Oral Daily  . atorvastatin  10 mg Oral Daily  . azithromycin  500 mg Oral Daily  . cholecalciferol  2,000 Units Oral Daily  . enoxaparin (LOVENOX) injection  40 mg Subcutaneous Q24H  . guaiFENesin  1,200 mg Oral BID  . methylPREDNISolone (SOLU-MEDROL) injection  40 mg Intravenous Daily  . montelukast  10 mg Oral QHS  . pantoprazole  40 mg Oral Daily  . sertraline  50 mg Oral Daily  . zinc sulfate  220 mg Oral Daily   Continuous Infusions: . sodium chloride Stopped (02/06/20 1106)     LOS: 1 day    Time spent: 35 minutes    Barb Merino, MD [facility] Pager [phone]

## 2020-02-06 NOTE — Consult Note (Signed)
   [facility] Inpatient Consult   02/06/2020  Lori Butler 05/08/1951 [id number]   Patient is currently active with [facility] for chronic disease management services.  Patient has been engaged by a [name] RN.   Will continue to monitor for progression and disposition plans and send update to [facility] RN of patient admission.  Of note, [facility] Care Management services does not replace or interfere with any services that are arranged by inpatient case management or social work.  [name], MSN, [facility] Liaison Nurse Mobile Phone [phone]  Toll free office [phone]

## 2020-02-06 NOTE — Progress Notes (Signed)
PT Cancellation Note  Patient Details Name: Lori Butler MRN: [id number] DOB: 1950-05-14   Cancelled Treatment:    Reason Eval/Treat Not Completed: Medical issues which prohibited therapy;Fatigue/lethargy limiting ability to participate, patient up earlier to [facility], became "sick" and does not feel well. Will check back tomorrow.   Claretha Cooper 02/06/2020, 2:05 PM [place] Pager [phone] Office [phone]

## 2020-02-07 LAB — CBC WITH DIFFERENTIAL/PLATELET
Abs Immature Granulocytes: 0.02 10*3/uL (ref 0.00–0.07)
Basophils Absolute: 0 10*3/uL (ref 0.0–0.1)
Basophils Relative: 0 %
Eosinophils Absolute: 0 10*3/uL (ref 0.0–0.5)
Eosinophils Relative: 0 %
HCT: 37.4 % (ref 36.0–46.0)
Hemoglobin: 11.6 g/dL — ABNORMAL LOW (ref 12.0–15.0)
Immature Granulocytes: 0 %
Lymphocytes Relative: 39 %
Lymphs Abs: 2.6 10*3/uL (ref 0.7–4.0)
MCH: 27.9 pg (ref 26.0–34.0)
MCHC: 31 g/dL (ref 30.0–36.0)
MCV: 89.9 fL (ref 80.0–100.0)
Monocytes Absolute: 0.9 10*3/uL (ref 0.1–1.0)
Monocytes Relative: 14 %
Neutro Abs: 3.1 10*3/uL (ref 1.7–7.7)
Neutrophils Relative %: 47 %
Platelets: 249 10*3/uL (ref 150–400)
RBC: 4.16 MIL/uL (ref 3.87–5.11)
RDW: 13.5 % (ref 11.5–15.5)
WBC: 6.6 10*3/uL (ref 4.0–10.5)
nRBC: 0 % (ref 0.0–0.2)

## 2020-02-07 LAB — COMPREHENSIVE METABOLIC PANEL
ALT: 25 U/L (ref 0–44)
AST: 30 U/L (ref 15–41)
Albumin: 3.5 g/dL (ref 3.5–5.0)
Alkaline Phosphatase: 284 U/L — ABNORMAL HIGH (ref 38–126)
Anion gap: 9 (ref 5–15)
BUN: 30 mg/dL — ABNORMAL HIGH (ref 8–23)
CO2: 24 mmol/L (ref 22–32)
Calcium: 8.5 mg/dL — ABNORMAL LOW (ref 8.9–10.3)
Chloride: 106 mmol/L (ref 98–111)
Creatinine, Ser: 0.8 mg/dL (ref 0.44–1.00)
GFR calc Af Amer: >60 mL/min (ref >60)
GFR calc non Af Amer: >60 mL/min (ref >60)
Glucose, Bld: 84 mg/dL (ref 70–99)
Potassium: 3.6 mmol/L (ref 3.5–5.1)
Sodium: 139 mmol/L (ref 135–145)
Total Bilirubin: 0.6 mg/dL (ref 0.3–1.2)
Total Protein: 7 g/dL (ref 6.5–8.1)

## 2020-02-07 LAB — D-DIMER, QUANTITATIVE: D-Dimer, Quant: 0.89 ug/mL-FEU — ABNORMAL HIGH (ref 0.00–0.50)

## 2020-02-07 MED ORDER — SODIUM CHLORIDE 0.9 % IV SOLN
100.0000 mg | Freq: Every day | INTRAVENOUS | Status: AC
  Administered 2020-02-07 – 2020-02-08 (×2): 100 mg via INTRAVENOUS
  Filled 2020-02-07: qty 20

## 2020-02-07 NOTE — Plan of Care (Signed)
  Problem: Education: Goal: Knowledge of General Education information will improve Description: Including pain rating scale, medication(s)/side effects and non-pharmacologic comfort measures Outcome: Progressing   Problem: Health Behavior/Discharge Planning: Goal: Ability to manage health-related needs will improve Outcome: Progressing   Problem: Clinical Measurements: Goal: Ability to maintain clinical measurements within normal limits will improve Outcome: Progressing Goal: Will remain free from infection Outcome: Progressing Goal: Diagnostic test results will improve Outcome: Progressing Goal: Respiratory complications will improve Outcome: Progressing Goal: Cardiovascular complication will be avoided Outcome: Progressing   Problem: Activity: Goal: Risk for activity intolerance will decrease Outcome: Progressing   Problem: Nutrition: Goal: Adequate nutrition will be maintained Outcome: Progressing   Problem: Coping: Goal: Level of anxiety will decrease Outcome: Progressing   Problem: Elimination: Goal: Will not experience complications related to bowel motility Outcome: Progressing Goal: Will not experience complications related to urinary retention Outcome: Progressing   Problem: Pain Managment: Goal: General experience of comfort will improve Outcome: Progressing   Problem: Safety: Goal: Ability to remain free from injury will improve Outcome: Progressing   Problem: Skin Integrity: Goal: Risk for impaired skin integrity will decrease Outcome: Progressing   Problem: Education: Goal: Knowledge of risk factors and measures for prevention of condition will improve Outcome: Progressing   Problem: Coping: Goal: Psychosocial and spiritual needs will be supported Outcome: Progressing

## 2020-02-07 NOTE — Evaluation (Addendum)
Physical Therapy Evaluation Patient Details Name: Lori Butler MRN: [id number] DOB: [birth year] Today's Date: 02/07/2020   History of Present Illness  69 year old female with history of COPD with 70-pack-year smoking history on 2 L oxygen at home, lung tumor status post radiation treatment, history of depression and limited mobility presented to ER with 2 days of sore throat, headache, body ache and fever with positive COVID-19 exposure.Admitted to the hospital with significant symptoms including COPD exacerbation due to COVID-19 virus infection.  Clinical Impression  The patient reports feeling better, reports that she is toDc tomorrow and feels comfortable, although patient lives alone. Reports family available PRN .Marland Kitchen Patient on O2 PTA, Has DME.  patient ambulated x 80' using RW on 2 L [place]. SPO2 >92%, HR from 92 up to 136, once resting, HR back to 90's. provided written HEP for U/LE's and energy conservation. Patient demonstrated exercises.  \ Pt admitted with above diagnosis.  Pt currently with functional limitations due to the deficits listed below (see PT Problem List). Pt will benefit from skilled PT to increase their independence and safety with mobility to allow discharge to the venue listed below.  Patient is transferring to [facility] independently.        Follow Up Recommendations Home health PT    Equipment Recommendations  None recommended by PT    Recommendations for Other Services       Precautions / Restrictions Precautions Precautions: Fall Precaution Comments: monitor vitals , n 2 L PTA      Mobility  Bed Mobility Overal bed mobility: Modified Independent                Transfers Overall transfer level: Needs assistance Equipment used: Rolling walker (2 wheeled) Transfers: Sit to/from Stand Sit to Stand: Supervision            Ambulation/Gait Ambulation/Gait assistance: Min guard Gait Distance (Feet): 80 Feet Assistive device: Rolling walker (2  wheeled) Gait Pattern/deviations: Step-through pattern Gait velocity: decr   General Gait Details: gait slow and steady  Stairs            Wheelchair Mobility    Modified Rankin (Stroke Patients Only)       Balance                                             Pertinent Vitals/Pain Pain Assessment: No/denies pain    Home Living Family/patient expects to be discharged to:: Private residence Living Arrangements: Alone Available Help at Discharge: Family;Available PRN/intermittently Type of Home: Apartment Home Access: Stairs to enter Entrance Stairs-Rails: None Entrance Stairs-Number of Steps: 3 Home Layout: One level Home Equipment: Grab bars - tub/shower;Shower seat;Walker - 4 wheels Additional Comments: Uses rollator when cooking and doing laundry.    Prior Function Level of Independence: Independent with assistive device(s)         Comments: reports no falls in the last 6 months     Hand Dominance   Dominant Hand: Right    Extremity/Trunk Assessment   Upper Extremity Assessment Upper Extremity Assessment: Generalized weakness (shaky hands post amb.)    Lower Extremity Assessment Lower Extremity Assessment: Generalized weakness    Cervical / Trunk Assessment Cervical / Trunk Assessment: Kyphotic  Communication   Communication: No difficulties  Cognition Arousal/Alertness: Awake/alert Behavior During Therapy: WFL for tasks assessed/performed Overall Cognitive Status: Within Functional Limits for tasks assessed  General Comments      Exercises Other Exercises Other Exercises: HEP for UE's provoded using TB Other Exercises: HEP for LE's for seated and standing. Other Exercises: energy conservation Handout provided and reviewed strtegies.   Assessment/Plan    PT Assessment Patient needs continued PT services  PT Problem List Decreased activity tolerance       PT  Treatment Interventions DME instruction;Gait training;Functional mobility training;Therapeutic activities;Therapeutic exercise;Patient/family education    PT Goals (Current goals can be found in the Care Plan section)  Acute Rehab PT Goals Patient Stated Goal: to get stronger PT Goal Formulation: With patient Time For Goal Achievement: 02/21/20 Potential to Achieve Goals: Good    Frequency Min 3X/week   Barriers to discharge Decreased caregiver support      Co-evaluation               AM-PAC PT "6 Clicks" Mobility  Outcome Measure Help needed turning from your back to your side while in a flat bed without using bedrails?: None Help needed moving from lying on your back to sitting on the side of a flat bed without using bedrails?: None Help needed moving to and from a bed to a chair (including a wheelchair)?: None Help needed standing up from a chair using your arms (e.g., wheelchair or bedside chair)?: None Help needed to walk in hospital room?: A Little Help needed climbing 3-5 steps with a railing? : A Little 6 Click Score: 22    End of Session Equipment Utilized During Treatment: Oxygen Activity Tolerance: Patient tolerated treatment well Patient left: in chair;with call bell/phone within reach Nurse Communication: Mobility status PT Visit Diagnosis: Difficulty in walking, not elsewhere classified (R26.2)    Time: 5974-1638 PT Time Calculation (min) (ACUTE ONLY): 36 min   Charges:   PT Evaluation $PT Eval Low Complexity: 1 Low PT Treatments $Gait Training: 8-22 mins        [place] Pager [phone] Office [phone]    Claretha Cooper 02/07/2020, 1:17 PM

## 2020-02-07 NOTE — Progress Notes (Signed)
PROGRESS NOTE    Lori Butler  HBZ:[id number] DOB: 03-Sep-1950 DOA: 02/04/2020 PCP: [name], MD    Brief Narrative:  69 year old female with history of COPD with 70-pack-year smoking history on 2 L oxygen at home, lung tumor status post radiation treatment, history of depression and limited mobility presented to ER with 2 days of sore throat, headache, body ache and fever with positive COVID-19 exposure. In the emergency room tachycardic, tachypneic with respiratory 33.  Blood pressures normal.  Electrolytes normal.  Procalcitonin normal.  Chest x-ray mostly with old scars.  On 2 L oxygen at baseline.  Was needing more than 3 L of oxygen.  COVID-19 was positive.  Admitted to the hospital with significant symptoms including COPD exacerbation due to COVID-19 virus infection.   Assessment & Plan:   Principal Problem:   Pneumonia due to COVID-19 virus Active Problems:   COPD with asthma ([place])   Physical deconditioning   Malignant neoplasm of upper lobe of left lung (HCC)   COPD with acute exacerbation (HCC)  COPD with acute exacerbation/probably aggravated by COVID-19 virus infection in a patient with underlying lung disease and lung cancer: Patient with significant symptoms including productive cough, increased oxygen demand so treating as COPD exacerbation and COVID-19 pneumonia. Aggressive bronchodilator therapy, IV steroids, inhalational steroids, scheduled and as needed bronchodilators, deep breathing exercises, incentive spirometry, chest physiotherapy and respiratory therapy consult. Patient has mucoid sputum production change from baseline, will treat with azithromycin for 5 days. Supplemental oxygen to keep saturations more than 90%.  COVID-19 virus infection: With no evidence of obvious pneumonia on chest x-ray.  Patient presented with COPD exacerbation.  Will treat with remdesivir for 5 days because of hypoxia.  Already on steroids.  Continue other supportive  treatments.  Profound physical deconditioning: Needs to work with PT OT before returning home.  History of depression: On Zoloft.   DVT prophylaxis: enoxaparin (LOVENOX) injection 40 mg Start: 02/04/20 2200   Code Status: Full code Family Communication: Daughter-in-law on the phone Disposition Plan: Status is: Inpatient  Remains inpatient appropriate because:IV treatments appropriate due to intensity of illness or inability to take PO and Inpatient level of care appropriate due to severity of illness   Dispo: The patient is from: Home              Anticipated d/c is to: Home with home health services              Anticipated d/c date is: Tomorrow              Patient currently is not medically stable to d/c.   Consultants:   None  Procedures:   None  Antimicrobials:  Anti-infectives (From admission, onward)   Start     Dose/Rate Route Frequency Ordered Stop   02/07/20 1030  remdesivir 100 mg in sodium chloride 0.9 % 100 mL IVPB        100 mg 200 mL/hr over 30 Minutes Intravenous Daily 02/07/20 1029 02/09/20 0959   02/05/20 1245  azithromycin (ZITHROMAX) tablet 500 mg        500 mg Oral Daily 02/05/20 1232 02/10/20 0959   02/05/20 1000  remdesivir 100 mg in sodium chloride 0.9 % 100 mL IVPB        100 mg 200 mL/hr over 30 Minutes Intravenous Daily 02/04/20 1520 02/07/20 1104   02/04/20 1715  remdesivir 100 mg in sodium chloride 0.9 % 100 mL IVPB        100 mg 200  mL/hr over 30 Minutes Intravenous Every 30 min 02/04/20 1706 02/04/20 1814   02/04/20 1600  remdesivir 200 mg in sodium chloride 0.9% 250 mL IVPB  Status:  Discontinued        200 mg 580 mL/hr over 30 Minutes Intravenous Once 02/04/20 1520 02/04/20 1706         Subjective: Patient seen and examined.  No overnight events.  More mobile today.  Working with therapies.  Mostly on 2 L oxygen.  Occasionally on 3 L oxygen.  Objective: Vitals:   02/06/20 1345 02/06/20 2039 02/07/20 0531 02/07/20 1348  BP:  119/64 (!) 109/57 117/62 115/62  Pulse: 88 86 83 85  Resp: 17 20 20 17   Temp:  98.4 F (36.9 C) 97.7 F (36.5 C) 98.1 F (36.7 C)  TempSrc:      SpO2: 99% 96% 98% 98%  Weight:      Height:        Intake/Output Summary (Last 24 hours) at 02/07/2020 1401 Last data filed at 02/07/2020 1225 Gross per 24 hour  Intake 960 ml  Output --  Net 960 ml   Filed Weights   02/05/20 1919  Weight: 79.5 kg    Examination:  General exam: Chronically sick looking but comfortable at rest.  resspiratory system: No added sounds.  Mostly conducted airway sounds. Cardiovascular system: S1 & S2 heard, RRR. No JVD, murmurs, rubs, gallops or clicks. No pedal edema. Gastrointestinal system: Abdomen is nondistended, soft and nontender. No organomegaly or masses felt. Normal bowel sounds heard. Central nervous system: Alert and oriented. No focal neurological deficits. Extremities: Symmetric 5 x 5 power. Skin: No rashes, lesions or ulcers Psychiatry: Judgement and insight appear normal. Mood & affect appropriate.     Data Reviewed: I have personally reviewed following labs and imaging studies  CBC: Recent Labs  Lab 02/04/20 1059 02/06/20 0350 02/07/20 0558  WBC 8.2 6.3 6.6  NEUTROABS 6.5 3.0 3.1  HGB 11.2* 11.5* 11.6*  HCT 34.7* 35.4* 37.4  MCV 88.3 87.0 89.9  PLT 253 192 829   Basic Metabolic Panel: Recent Labs  Lab 02/04/20 1059 02/06/20 0350 02/07/20 0558  NA 133* 138 139  K 3.8 3.9 3.6  CL 101 105 106  CO2 23 22 24   GLUCOSE 74 89 84  BUN 15 29* 30*  CREATININE 0.60 0.78 0.80  CALCIUM 8.2* 8.3* 8.5*  MG  --  2.2  --   PHOS  --  3.1  --    GFR: Estimated Creatinine Clearance: 68.2 mL/min (by C-G formula based on SCr of 0.8 mg/dL). Liver Function Tests: Recent Labs  Lab 02/04/20 1059 02/06/20 0350 02/07/20 0558  AST 36 38 30  ALT 28 30 25   ALKPHOS 366* 319* 284*  BILITOT 0.7 0.6 0.6  PROT 7.1 6.7 7.0  ALBUMIN 3.9 3.4* 3.5   No results for input(s): LIPASE,  AMYLASE in the last 168 hours. No results for input(s): AMMONIA in the last 168 hours. Coagulation Profile: No results for input(s): INR, PROTIME in the last 168 hours. Cardiac Enzymes: No results for input(s): CKTOTAL, CKMB, CKMBINDEX, TROPONINI in the last 168 hours. BNP (last 3 results) No results for input(s): PROBNP in the last 8760 hours. HbA1C: No results for input(s): HGBA1C in the last 72 hours. CBG: No results for input(s): GLUCAP in the last 168 hours. Lipid Profile: No results for input(s): CHOL, HDL, LDLCALC, TRIG, CHOLHDL, LDLDIRECT in the last 72 hours. Thyroid Function Tests: No results for input(s): TSH, T4TOTAL, FREET4,  T3FREE, THYROIDAB in the last 72 hours. Anemia Panel: Recent Labs    02/05/20 1305  FERRITIN 87   Sepsis Labs: Recent Labs  Lab 02/04/20 1059 02/04/20 1304  PROCALCITON <0.10  --   LATICACIDVEN 0.7 0.6    Recent Results (from the past 240 hour(s))  Respiratory Panel by RT PCR (Flu A&B, Covid) - Nasopharyngeal Swab     Status: Abnormal   Collection Time: 02/04/20 10:59 AM   Specimen: Nasopharyngeal Swab  Result Value Ref Range Status   SARS Coronavirus 2 by RT PCR POSITIVE (A) NEGATIVE Final    Comment: RESULT CALLED TO, READ BACK BY AND VERIFIED WITH: CLAPP,S. RN @1412  ON 09.29.2021 BY COHEN,K    Influenza A by PCR NEGATIVE NEGATIVE Final   Influenza B by PCR NEGATIVE NEGATIVE Final    Comment: Performed at [facility], [place] [address] [place], [place] [address]  Blood Culture (routine x 2)     Status: None (Preliminary result)   Collection Time: 02/04/20 10:59 AM   Specimen: BLOOD  Result Value Ref Range Status   Specimen Description   Final    BLOOD LEFT ANTECUBITAL Performed at [facility] [address], [place], [place] [address]    Special Requests   Final    BOTTLES DRAWN AEROBIC AND ANAEROBIC Blood Culture adequate volume Performed at [facility] [address] [place], [place] [address]    Culture   Final    NO GROWTH 3 DAYS Performed at [facility], [place] [address], [place], [place] [address]    Report Status PENDING  Incomplete  Blood Culture (routine x 2)     Status: None (Preliminary result)   Collection Time: 02/04/20 10:59 AM   Specimen: BLOOD  Result Value Ref Range Status   Specimen Description   Final    BLOOD BLOOD RIGHT HAND Performed at [facility] [address], [place], [place] [address]    Special Requests   Final    BOTTLES DRAWN AEROBIC AND ANAEROBIC Blood Culture adequate volume Performed at [facility] [address] [place], [place] [address]    Culture   Final    NO GROWTH 3 DAYS Performed at [facility], [place] [address], [place], [place] [address]    Report Status PENDING  Incomplete         Radiology Studies: No results found.      Scheduled Meds: . vitamin C  500 mg Oral Daily  . atorvastatin  10 mg Oral Daily  . azithromycin  500 mg Oral Daily  . cholecalciferol  2,000 Units Oral Daily  . enoxaparin (LOVENOX) injection  40 mg Subcutaneous Q24H  . guaiFENesin  1,200 mg Oral BID  . methylPREDNISolone (SOLU-MEDROL) injection  40 mg Intravenous Daily  . montelukast  10 mg Oral QHS  . pantoprazole  40 mg Oral Daily  . sertraline  50 mg Oral Daily  . zinc sulfate  220 mg Oral Daily   Continuous Infusions: . sodium chloride Stopped (02/06/20 1106)  . remdesivir 100 mg in NS 100 mL 100 mg (02/07/20 1034)     LOS: 2 days    Time spent: 35 minutes    Barb Merino, MD [facility] Pager [phone]

## 2020-02-08 LAB — CBC WITH DIFFERENTIAL/PLATELET
Abs Immature Granulocytes: 0.03 10*3/uL (ref 0.00–0.07)
Basophils Absolute: 0 10*3/uL (ref 0.0–0.1)
Basophils Relative: 0 %
Eosinophils Absolute: 0 10*3/uL (ref 0.0–0.5)
Eosinophils Relative: 0 %
HCT: 35.8 % — ABNORMAL LOW (ref 36.0–46.0)
Hemoglobin: 11.4 g/dL — ABNORMAL LOW (ref 12.0–15.0)
Immature Granulocytes: 1 %
Lymphocytes Relative: 48 %
Lymphs Abs: 2.6 10*3/uL (ref 0.7–4.0)
MCH: 28.1 pg (ref 26.0–34.0)
MCHC: 31.8 g/dL (ref 30.0–36.0)
MCV: 88.2 fL (ref 80.0–100.0)
Monocytes Absolute: 0.8 10*3/uL (ref 0.1–1.0)
Monocytes Relative: 14 %
Neutro Abs: 2 10*3/uL (ref 1.7–7.7)
Neutrophils Relative %: 37 %
Platelets: 233 10*3/uL (ref 150–400)
RBC: 4.06 MIL/uL (ref 3.87–5.11)
RDW: 13.5 % (ref 11.5–15.5)
WBC: 5.4 10*3/uL (ref 4.0–10.5)
nRBC: 0 % (ref 0.0–0.2)

## 2020-02-08 LAB — COMPREHENSIVE METABOLIC PANEL
ALT: 22 U/L (ref 0–44)
AST: 26 U/L (ref 15–41)
Albumin: 3.5 g/dL (ref 3.5–5.0)
Alkaline Phosphatase: 255 U/L — ABNORMAL HIGH (ref 38–126)
Anion gap: 10 (ref 5–15)
BUN: 27 mg/dL — ABNORMAL HIGH (ref 8–23)
CO2: 22 mmol/L (ref 22–32)
Calcium: 8.3 mg/dL — ABNORMAL LOW (ref 8.9–10.3)
Chloride: 108 mmol/L (ref 98–111)
Creatinine, Ser: 0.68 mg/dL (ref 0.44–1.00)
GFR calc Af Amer: >60 mL/min (ref >60)
GFR calc non Af Amer: >60 mL/min (ref >60)
Glucose, Bld: 75 mg/dL (ref 70–99)
Potassium: 3.7 mmol/L (ref 3.5–5.1)
Sodium: 140 mmol/L (ref 135–145)
Total Bilirubin: 0.7 mg/dL (ref 0.3–1.2)
Total Protein: 6.8 g/dL (ref 6.5–8.1)

## 2020-02-08 LAB — D-DIMER, QUANTITATIVE: D-Dimer, Quant: 0.88 ug/mL-FEU — ABNORMAL HIGH (ref 0.00–0.50)

## 2020-02-08 MED ORDER — PREDNISONE 10 MG PO TABS: ORAL_TABLET | ORAL | 0 refills | Status: DC

## 2020-02-08 NOTE — Discharge Summary (Signed)
Physician Discharge Summary  Lori Butler ZOX:[id number] DOB: 1950-06-09 DOA: 02/04/2020  PCP: [name], MD  Admit date: 02/04/2020 Discharge date: 02/08/2020  Admitted From: Home Disposition: Home with home health and PT  Recommendations for Outpatient Follow-up:  1. Follow up with PCP in 1-2 weeks 2. Please obtain BMP/CBC in one week 3. 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  She is fully vaccinated against COVID-19.  In the emergency room she was tachycardic and tachypneic with respiratory 33.  Blood pressures were normal.  Procalcitonin was normal.  Chest x-ray showed mostly old scars.  She is on 2 L at baseline and she was short of breath on mobility with requirement of more than 3 L of oxygen.  Patient was admitted to the hospital because of increased requirement of oxygen, tachypnea.  Chest x-ray did not show any evidence of pneumonia, however patient had clear evidence of COPD exacerbation probably related to COVID-19 virus infection.  She was admitted to the hospital and treated with steroids, bronchodilator therapies, remdesivir for 5 days with very good clinical response.  Today patient is back to her usual health condition and mobilizing with 2 L of oxygen and using her walker.  Will discharge home with tapering dose of prednisone mainly to cover for COPD exacerbation.  Rest of the chronic medical issues remained stable.  She work with therapies and had  impaired mobility and she will benefit with home health PT.  Discharge Diagnoses:  Principal Problem:   Pneumonia due to COVID-19 virus Active Problems:   COPD with asthma [facility])   Physical deconditioning   Malignant neoplasm of upper lobe of left lung (HCC)   COPD with acute exacerbation [facility])    Discharge Instructions  Discharge Instructions    Call MD for:  difficulty breathing, headache or visual disturbances   Complete by: As directed    Call MD for:  extreme fatigue   Complete by: As directed    Diet general   Complete by: As directed    Increase activity slowly   Complete by: As directed      Allergies as of 02/08/2020      Reactions   Demerol [meperidine] Other (See Comments)   "horrible reaction"   Oxycodone-acetaminophen Other (See Comments)   Upset stomach    Tramadol Nausea And Vomiting      Medication List    TAKE these medications   albuterol 108 (90 Base) MCG/ACT inhaler Commonly known as: VENTOLIN HFA Inhale 2 puffs into the lungs every 6 (six) hours as needed for wheezing or shortness of breath.   albuterol (2.5 MG/3ML) 0.083% nebulizer solution Commonly known as: PROVENTIL Take 3 mLs (2.5 mg total) by nebulization every 6 (six) hours as needed for wheezing or shortness of breath.   alendronate 70 MG tablet Commonly known as: FOSAMAX Take 70 mg by mouth every Wednesday.   atorvastatin 10 MG tablet Commonly known as: LIPITOR Take 10 mg by mouth daily.   guaiFENesin 600 MG 12 hr tablet Commonly known as: Mucinex Take  2 tablets (1,200 mg total) by mouth 2 (two) times daily. What changed:   how much to take  when to take this  reasons to take this   montelukast 10 MG tablet Commonly known as: SINGULAIR Take 1 tablet (10 mg total) by mouth at bedtime.   naproxen 375 MG tablet Commonly known as: NAPROSYN Take 1 tablet (375 mg total) by mouth 2 (two) times daily.   pantoprazole 40 MG tablet Commonly known as: PROTONIX Take 1 tablet  (40 mg total) by mouth 2 (two) times daily. What changed: when to take this   predniSONE 10 MG tablet Commonly known as: DELTASONE 4 tabs daily for 3 days 3 tabs daily for 3 days 2 tabs daily for 3 days 1 tab daily for 3 days   sertraline 50 MG tablet Commonly known as: ZOLOFT Take 1 tablet (50 mg total) by mouth daily.   Trelegy Ellipta 200-62.5-25 MCG/INH Aepb Generic drug: Fluticasone-Umeclidin-Vilant INHALE ONE PUFF INTO THE LUNGS EVERY DAY   Vitamin B-12 5000 MCG Tbdp Take 1 tablet by mouth daily.   Vitamin D 50 MCG (2000 UT) tablet Take 2,000 Units by mouth daily.       Follow-up Information    [name], MD Follow up in 2 week(s).   Specialty: Family Medicine Contact information: [place] [address] [phone]              Allergies  Allergen Reactions  . Demerol [Meperidine] Other (See Comments)    "horrible reaction"  . Oxycodone-Acetaminophen Other (See Comments)    Upset stomach   . Tramadol Nausea And Vomiting    Consultations:  None   Procedures/Studies: DG Chest Port 1 View  Result Date: 02/04/2020 CLINICAL DATA:  Cough and fever, history of lung cancer EXAM: PORTABLE CHEST 1 VIEW COMPARISON:  02/19/2019 FINDINGS: Postop changes in the left upper lobe with parenchymal scarring. Similar posterior eventration of the right hemidiaphragm. No definite acute airspace process, collapse or consolidation. Negative for edema, large effusion or pneumothorax. Trachea midline. Borderline heart size. Aorta atherosclerotic. No acute osseous finding. IMPRESSION: Stable postoperative findings. No superimposed acute process by plain radiography Aortic Atherosclerosis (ICD10-I70.0). Electronically Signed   By: Jerilynn Mages.  Shick M.D.   On: 02/04/2020 10:59   (Echo, Carotid, EGD, Colonoscopy, ERCP)    Subjective: Patient seen and examined.  No overnight events.  She is very eager to go home today.  Denies any shortness of breath at rest.  She did  walk with physical therapy with her walker and felt fairly comfortable.  She does have some cough for that she will use over-the-counter cough medications.   Discharge Exam: Vitals:   02/08/20 0614 02/08/20 1321  BP:  135/70  Pulse: 68 76  Resp:  16  Temp:  98.7 F (37.1 C)  SpO2: 97% 99%   Vitals:   02/08/20 0231 02/08/20 0527 02/08/20 0614 02/08/20 1321  BP:  133/63  135/70  Pulse: 62 70 68 76  Resp:  (!) 22  16  Temp:  97.8 F (36.6 C)  98.7 F (37.1 C)  TempSrc:      SpO2: 96% 98% 97% 99%  Weight:      Height:        General: Pt is alert, awake, not in acute distress Looks comfortable on 2 L oxygen. Cardiovascular: RRR, S1/S2 +, no rubs, no gallops Respiratory: CTA bilaterally, no wheezing, no rhonchi, no added sounds. Abdominal: Soft, NT, ND, bowel sounds + Extremities: no  edema, no cyanosis    The results of significant diagnostics from this hospitalization (including imaging, microbiology, ancillary and laboratory) are listed below for reference.     Microbiology: Recent Results (from the past 240 hour(s))  Respiratory Panel by RT PCR (Flu A&B, Covid) - Nasopharyngeal Swab     Status: Abnormal   Collection Time: 02/04/20 10:59 AM   Specimen: Nasopharyngeal Swab  Result Value Ref Range Status   SARS Coronavirus 2 by RT PCR POSITIVE (A) NEGATIVE Final    Comment: RESULT CALLED TO, READ BACK BY AND VERIFIED WITH: CLAPP,S. RN @1412  ON 09.29.2021 BY COHEN,K    Influenza A by PCR NEGATIVE NEGATIVE Final   Influenza B by PCR NEGATIVE NEGATIVE Final    Comment: Performed at [facility], [place] [address], [place], [place] [address]  Blood Culture (routine x 2)     Status: None (Preliminary result)   Collection Time: 02/04/20 10:59 AM   Specimen: BLOOD  Result Value Ref Range Status   Specimen Description   Final    BLOOD LEFT ANTECUBITAL Performed at [place] [address], [place], [place] [address]    Special  Requests   Final    BOTTLES DRAWN AEROBIC AND ANAEROBIC Blood Culture adequate volume Performed at [place] [address] [place], [place] [address]    Culture   Final    NO GROWTH 4 DAYS Performed at [facility], [place] [address] [place], [place] [address]    Report Status PENDING  Incomplete  Blood Culture (routine x 2)     Status: None (Preliminary result)   Collection Time: 02/04/20 10:59 AM   Specimen: BLOOD  Result Value Ref Range Status   Specimen Description   Final    BLOOD BLOOD RIGHT HAND Performed at [place] [address] [place], [place] [address]    Special Requests   Final    BOTTLES DRAWN AEROBIC AND ANAEROBIC Blood Culture adequate volume Performed at [place] [address] [place], [place] [address]    Culture   Final    NO GROWTH 4 DAYS Performed at [facility], [place] [address] [place], [place] [address]    Report Status PENDING  Incomplete     Labs: BNP (last 3 results) Recent Labs    02/28/19 2153  BNP 00.9   Basic Metabolic Panel: Recent Labs  Lab 02/04/20 1059 02/06/20 0350 02/07/20 0558 02/08/20 0544  NA 133* 138 139 140  K 3.8 3.9 3.6 3.7  CL 101 105 106 108  CO2 23 22 24 22   GLUCOSE 74 89 84 75  BUN 15 29* 30* 27*  CREATININE 0.60 0.78 0.80 0.68  CALCIUM 8.2* 8.3* 8.5* 8.3*  MG  --  2.2  --   --   PHOS  --  3.1  --   --    Liver Function Tests: Recent Labs  Lab 02/04/20 1059 02/06/20 0350 02/07/20 0558 02/08/20 0544  AST 36 38 30 26  ALT 28 30 25 22   ALKPHOS 366* 319* 284* 255*  BILITOT 0.7 0.6 0.6 0.7  PROT 7.1 6.7 7.0 6.8  ALBUMIN 3.9 3.4* 3.5 3.5   No results for input(s): LIPASE, AMYLASE in the last 168 hours. No results for input(s): AMMONIA in the last 168 hours. CBC: Recent Labs  Lab 02/04/20 1059 02/06/20 0350 02/07/20 0558 02/08/20 0544  WBC 8.2 6.3 6.6 5.4  NEUTROABS 6.5 3.0  3.1 2.0  HGB 11.2* 11.5* 11.6* 11.4*   HCT 34.7* 35.4* 37.4 35.8*  MCV 88.3 87.0 89.9 88.2  PLT 253 192 249 233   Cardiac Enzymes: No results for input(s): CKTOTAL, CKMB, CKMBINDEX, TROPONINI in the last 168 hours. BNP: Invalid input(s): POCBNP CBG: No results for input(s): GLUCAP in the last 168 hours. D-Dimer Recent Labs    02/07/20 0558 02/08/20 0544  DDIMER 0.89* 0.88*   Hgb A1c No results for input(s): HGBA1C in the last 72 hours. Lipid Profile No results for input(s): CHOL, HDL, LDLCALC, TRIG, CHOLHDL, LDLDIRECT in the last 72 hours. Thyroid function studies No results for input(s): TSH, T4TOTAL, T3FREE, THYROIDAB in the last 72 hours.  Invalid input(s): FREET3 Anemia work up No results for input(s): VITAMINB12, FOLATE, FERRITIN, TIBC, IRON, RETICCTPCT in the last 72 hours. Urinalysis    Component Value Date/Time   COLORURINE YELLOW 03/01/2019 0112   APPEARANCEUR CLEAR 03/01/2019 0112   LABSPEC 1.020 03/01/2019 0112   PHURINE 6.0 03/01/2019 0112   GLUCOSEU NEGATIVE 03/01/2019 0112   HGBUR NEGATIVE 03/01/2019 0112   BILIRUBINUR NEGATIVE 03/01/2019 0112   KETONESUR 20 (A) 03/01/2019 0112   PROTEINUR NEGATIVE 03/01/2019 0112   UROBILINOGEN 1.0 07/12/2012 1842   NITRITE NEGATIVE 03/01/2019 0112   LEUKOCYTESUR NEGATIVE 03/01/2019 0112   Sepsis Labs Invalid input(s): PROCALCITONIN,  WBC,  LACTICIDVEN Microbiology Recent Results (from the past 240 hour(s))  Respiratory Panel by RT PCR (Flu A&B, Covid) - Nasopharyngeal Swab     Status: Abnormal   Collection Time: 02/04/20 10:59 AM   Specimen: Nasopharyngeal Swab  Result Value Ref Range Status   SARS Coronavirus 2 by RT PCR POSITIVE (A) NEGATIVE Final    Comment: RESULT CALLED TO, READ BACK BY AND VERIFIED WITH: CLAPP,S. RN @1412  ON 09.29.2021 BY COHEN,K    Influenza A by PCR NEGATIVE NEGATIVE Final   Influenza B by PCR NEGATIVE NEGATIVE Final    Comment: Performed at [facility], [place] [address], [place], [place] [address]   Blood Culture (routine x 2)     Status: None (Preliminary result)   Collection Time: 02/04/20 10:59 AM   Specimen: BLOOD  Result Value Ref Range Status   Specimen Description   Final    BLOOD LEFT ANTECUBITAL Performed at [place] [place] [address] [place], [place] [address]    Special Requests   Final    BOTTLES DRAWN AEROBIC AND ANAEROBIC Blood Culture adequate volume Performed at [facility] [address], [place], [place] [address]    Culture   Final    NO GROWTH 4 DAYS Performed at [facility], [place] [address], [place], [place] [address]    Report Status PENDING  Incomplete  Blood Culture (routine x 2)     Status: None (Preliminary result)   Collection Time: 02/04/20 10:59 AM   Specimen: BLOOD  Result Value Ref Range Status   Specimen Description   Final    BLOOD BLOOD RIGHT HAND Performed at [place] [address], [place], [place] [address]    Special Requests   Final    BOTTLES DRAWN AEROBIC AND ANAEROBIC Blood Culture adequate volume Performed at [place] [address] [place], [place] [address]    Culture   Final    NO GROWTH 4 DAYS Performed at [facility], [place] [address], [place], [place] [address]    Report Status PENDING  Incomplete  Time coordinating discharge:  40 minutes  SIGNED:   Barb Merino, MD  [facility] 02/08/2020, 1:42 PM

## 2020-02-08 NOTE — Progress Notes (Signed)
Pt will go home this evening with PTAR, as pt requires oxygen. Pt has her own oxygen at home already, as she is chronic O2 user of 2 l/min via [place]. Pt aware to followup with PCP. Pt aware of home medications.

## 2020-02-08 NOTE — TOC Transition Note (Addendum)
Transition of Care [facility]) - CM/SW Discharge Note   Patient Details  Name: Lori Butler MRN: [id number] Date of Birth: 1951/04/06  [facility]) CM/SW Contact:  Servando Snare, LCSW Phone Number: 02/08/2020, 1:06 PM   Clinical Narrative:   Patient to dc home with [place]. HHPT arranged with [facility]. Transportation will be arranged for patient at dc. Call TOC at [phone].   2:26 PM PTAR arranged for patient as Patient requires o2.     Final next level of care: [place] Barriers to Discharge: No Barriers Identified   Patient Goals and CMS Choice     Choice offered to / list presented to : NA  Discharge Placement                       Discharge Plan and Services                DME Arranged: N/A DME Agency: NA       HH Arranged: PT HH Agency: [place] Date [place]: 02/08/20 Time [place]: 5056 Representative spoke with at [place]: [place] Determinants of Health ([place]) Interventions     Readmission Risk Interventions No flowsheet data found.

## 2020-02-09 ENCOUNTER — Ambulatory Visit: Payer: Self-pay | Admitting: Radiation Oncology

## 2020-02-09 ENCOUNTER — Other Ambulatory Visit: Payer: Self-pay | Admitting: Radiation Oncology

## 2020-02-09 LAB — CULTURE, BLOOD (ROUTINE X 2)
Culture: NO GROWTH
Culture: NO GROWTH
Special Requests: ADEQUATE
Special Requests: ADEQUATE

## 2020-02-10 ENCOUNTER — Other Ambulatory Visit: Payer: Self-pay

## 2020-02-10 NOTE — Patient Outreach (Signed)
[facility]) Care Management  02/10/2020  Lori Butler 1950/07/22 [id number]   Telephone Assessment   Outreach attempt to patient. Spoke with patient. She is pleased to report that she is back home. She shares her recent COVID-19 experience. Patient states that her sister and brother(both vaccinated) came to visit from out of town and they were cautious and careful. However, her sister started experiencing sxs and tested positive. Patient then got sick and tested positive. She voices that her sister remains in the hospital at this time. Patient states that she is feeling better. She denies any SOB. She continues to have a cough. She is taking tapering dose of Prednisone. Appetite is fair. She denies loss of smell/taste. She voices that her quarantine ends on 02/16/20 and she goes for PCP appt on 02/20/20. [place] services have contacted her to begin [place]. She states that she has everything she needs in the home. Her son continues to be supportive and checks in on her. She denies any RN CM needs or concerns at this time.   Medications Reviewed Today    Reviewed by Hayden Pedro, RN (Registered Nurse) on 02/10/20 at [phone]  Med List Status: <None>  Medication Order Taking? Sig Documenting Provider Last Dose Status Informant  albuterol (PROVENTIL HFA;VENTOLIN HFA) 108 (90 Base) MCG/ACT inhaler [id number] No Inhale 2 puffs into the lungs every 6 (six) hours as needed for wheezing or shortness of breath. Rigoberto Noel, MD 02/03/2020 Unknown time Active Self  albuterol (PROVENTIL) (2.5 MG/3ML) 0.083% nebulizer solution [id number] No Take 3 mLs (2.5 mg total) by nebulization every 6 (six) hours as needed for wheezing or shortness of breath. Rigoberto Noel, MD Past Month Unknown time Active Self  alendronate (FOSAMAX) 70 MG tablet [id number] No Take 70 mg by mouth every Wednesday.  [provider] 02/04/2020 Unknown time Active Self           Med Note Leanord Asal, TREVINA M    Mon Dec 24, 2017  2:52 PM)    atorvastatin (LIPITOR) 10 MG tablet [id number] No Take 10 mg by mouth daily. [provider] 02/04/2020 Unknown time Active Self  Cholecalciferol (VITAMIN D) 50 MCG (2000 UT) tablet [id number] No Take 2,000 Units by mouth daily. [provider] 02/04/2020 Unknown time Active Self  Cyanocobalamin (VITAMIN B-12) 5000 MCG TBDP [id number] No Take 1 tablet by mouth daily. [provider] 02/04/2020 Unknown time Active Self  Fluticasone-Umeclidin-Vilant (TRELEGY ELLIPTA) 200-62.5-25 MCG/INH AEPB [id number] No INHALE ONE PUFF INTO THE LUNGS EVERY DAY Martyn Ehrich, NP 02/04/2020 Unknown time Active Self  guaiFENesin (MUCINEX) 600 MG 12 hr tablet [id number] No Take 2 tablets (1,200 mg total) by mouth 2 (two) times daily.  Patient taking differently: Take 600 mg by mouth 2 (two) times daily as needed for cough.    Martyn Ehrich, NP 02/03/2020 Unknown time Active Self  montelukast (SINGULAIR) 10 MG tablet [id number] No Take 1 tablet (10 mg total) by mouth at bedtime. Arnell Asal, NP 02/03/2020 Unknown time Active Self  naproxen (NAPROSYN) 375 MG tablet [id number] No Take 1 tablet (375 mg total) by mouth 2 (two) times daily. Nat Christen, MD Taking Active Self  pantoprazole (PROTONIX) 40 MG tablet [id number] No Take 1 tablet (40 mg total) by mouth 2 (two) times daily.  Patient taking differently: Take 40 mg by mouth daily.    Arnell Asal, NP Taking Active Self  predniSONE (DELTASONE) 10 MG tablet [id number]  4 tabs daily for 3  days 3 tabs daily for 3 days 2 tabs daily for 3 days 1 tab daily for 3 days Barb Merino, MD  Active   sertraline (ZOLOFT) 50 MG tablet [id number] No Take 1 tablet (50 mg total) by mouth daily. [name], MD 02/04/2020 Unknown time Active Self          Goals Addressed            This Visit's Progress   . Manage Fatigue (Tiredness)       Follow Up Date *Dec 2021   - eat healthy - get at least 7 to 8 hours of  sleep at night - use devices that will help like a cane, sock-puller or reacher  Patient will verbalize actively participating with HHPT and report increase in mobility/strength   Why is this important?   Feeling tired or worn out is a common symptom of COPD (chronic obstructive pulmonary disease).  Learning when you feel your best and when you need rest is important.  Managing the tiredness (fatigue) will help you be active and enjoy life.     Notes:     . Track and Manage My Symptoms       Follow Up Date Nov 2021   - arrange in-home help services - develop a rescue plan - follow rescue plan if symptoms flare-up - keep follow-up appointments    Why is this important?   Tracking your symptoms and other information about your health helps your doctor plan your care.  Write down the symptoms, the time of day, what you were doing and what medicine you are taking.  You will soon learn how to manage your symptoms.     Notes:     . Track and Manage My Triggers       Follow Up Date Nov 2021   - limit outdoor activity during cold weather -patient will be able to verbalize at least 2-3 ways to manage resp status and prevent excerbation    Why is this important?   Triggers are activities or things, like tobacco smoke or cold weather, that make your COPD (chronic obstructive pulmonary disease) flare-up.  Knowing these triggers helps you plan how to stay away from them.  When you cannot remove them, you can learn how to manage them.     Notes:        Plan: RN CM discussed with patient next outreach within the month of Nov . Patient gave verbal consent and in agreement with RN CM follow up and timeframe. Patient aware that they may contact RN CM sooner for any issues or concerns.  [name], RN,BSN,CCM [facility] Telephonic Care Management Coordinator Direct Phone: [phone] Toll Free: [phone] Fax: [phone]

## 2020-02-16 DIAGNOSIS — J9621 Acute and chronic respiratory failure with hypoxia: Secondary | ICD-10-CM | POA: Diagnosis not present

## 2020-02-16 DIAGNOSIS — M810 Age-related osteoporosis without current pathological fracture: Secondary | ICD-10-CM | POA: Diagnosis not present

## 2020-02-16 DIAGNOSIS — J441 Chronic obstructive pulmonary disease with (acute) exacerbation: Secondary | ICD-10-CM | POA: Diagnosis not present

## 2020-02-16 DIAGNOSIS — M160 Bilateral primary osteoarthritis of hip: Secondary | ICD-10-CM | POA: Diagnosis not present

## 2020-02-16 DIAGNOSIS — J440 Chronic obstructive pulmonary disease with acute lower respiratory infection: Secondary | ICD-10-CM | POA: Diagnosis not present

## 2020-02-16 DIAGNOSIS — U071 COVID-19: Secondary | ICD-10-CM | POA: Diagnosis not present

## 2020-02-16 DIAGNOSIS — J1282 Pneumonia due to coronavirus disease 2019: Secondary | ICD-10-CM | POA: Diagnosis not present

## 2020-02-16 DIAGNOSIS — C3412 Malignant neoplasm of upper lobe, left bronchus or lung: Secondary | ICD-10-CM | POA: Diagnosis not present

## 2020-02-16 DIAGNOSIS — F32A Depression, unspecified: Secondary | ICD-10-CM | POA: Diagnosis not present

## 2020-02-19 DIAGNOSIS — J1282 Pneumonia due to coronavirus disease 2019: Secondary | ICD-10-CM | POA: Diagnosis not present

## 2020-02-19 DIAGNOSIS — J9621 Acute and chronic respiratory failure with hypoxia: Secondary | ICD-10-CM | POA: Diagnosis not present

## 2020-02-19 DIAGNOSIS — M160 Bilateral primary osteoarthritis of hip: Secondary | ICD-10-CM | POA: Diagnosis not present

## 2020-02-19 DIAGNOSIS — U071 COVID-19: Secondary | ICD-10-CM | POA: Diagnosis not present

## 2020-02-19 DIAGNOSIS — J441 Chronic obstructive pulmonary disease with (acute) exacerbation: Secondary | ICD-10-CM | POA: Diagnosis not present

## 2020-02-19 DIAGNOSIS — J440 Chronic obstructive pulmonary disease with acute lower respiratory infection: Secondary | ICD-10-CM | POA: Diagnosis not present

## 2020-02-19 DIAGNOSIS — F32A Depression, unspecified: Secondary | ICD-10-CM | POA: Diagnosis not present

## 2020-02-19 DIAGNOSIS — C3412 Malignant neoplasm of upper lobe, left bronchus or lung: Secondary | ICD-10-CM | POA: Diagnosis not present

## 2020-02-19 DIAGNOSIS — M810 Age-related osteoporosis without current pathological fracture: Secondary | ICD-10-CM | POA: Diagnosis not present

## 2020-02-20 DIAGNOSIS — Z23 Encounter for immunization: Secondary | ICD-10-CM | POA: Diagnosis not present

## 2020-02-20 DIAGNOSIS — U071 COVID-19: Secondary | ICD-10-CM | POA: Diagnosis not present

## 2020-02-20 DIAGNOSIS — Z6829 Body mass index (BMI) 29.0-29.9, adult: Secondary | ICD-10-CM | POA: Diagnosis not present

## 2020-02-20 DIAGNOSIS — J449 Chronic obstructive pulmonary disease, unspecified: Secondary | ICD-10-CM | POA: Diagnosis not present

## 2020-02-20 DIAGNOSIS — R6889 Other general symptoms and signs: Secondary | ICD-10-CM | POA: Diagnosis not present

## 2020-02-24 DIAGNOSIS — J1282 Pneumonia due to coronavirus disease 2019: Secondary | ICD-10-CM | POA: Diagnosis not present

## 2020-02-24 DIAGNOSIS — J440 Chronic obstructive pulmonary disease with acute lower respiratory infection: Secondary | ICD-10-CM | POA: Diagnosis not present

## 2020-02-24 DIAGNOSIS — F32A Depression, unspecified: Secondary | ICD-10-CM | POA: Diagnosis not present

## 2020-02-24 DIAGNOSIS — C3412 Malignant neoplasm of upper lobe, left bronchus or lung: Secondary | ICD-10-CM | POA: Diagnosis not present

## 2020-02-24 DIAGNOSIS — J9621 Acute and chronic respiratory failure with hypoxia: Secondary | ICD-10-CM | POA: Diagnosis not present

## 2020-02-24 DIAGNOSIS — J441 Chronic obstructive pulmonary disease with (acute) exacerbation: Secondary | ICD-10-CM | POA: Diagnosis not present

## 2020-02-24 DIAGNOSIS — U071 COVID-19: Secondary | ICD-10-CM | POA: Diagnosis not present

## 2020-02-24 DIAGNOSIS — M810 Age-related osteoporosis without current pathological fracture: Secondary | ICD-10-CM | POA: Diagnosis not present

## 2020-02-24 DIAGNOSIS — M160 Bilateral primary osteoarthritis of hip: Secondary | ICD-10-CM | POA: Diagnosis not present

## 2020-02-25 ENCOUNTER — Telehealth: Payer: Self-pay | Admitting: *Deleted

## 2020-02-25 NOTE — Telephone Encounter (Signed)
CALLED PATIENT TO INFORM THAT HER CT WILL BE DONE THE LAST OF NOV, SPIOKE WITH PATIENT AND SHE VERIFIED UNDERSTANDING THIS

## 2020-02-27 DIAGNOSIS — J440 Chronic obstructive pulmonary disease with acute lower respiratory infection: Secondary | ICD-10-CM | POA: Diagnosis not present

## 2020-02-27 DIAGNOSIS — M160 Bilateral primary osteoarthritis of hip: Secondary | ICD-10-CM | POA: Diagnosis not present

## 2020-02-27 DIAGNOSIS — J1282 Pneumonia due to coronavirus disease 2019: Secondary | ICD-10-CM | POA: Diagnosis not present

## 2020-02-27 DIAGNOSIS — C3412 Malignant neoplasm of upper lobe, left bronchus or lung: Secondary | ICD-10-CM | POA: Diagnosis not present

## 2020-02-27 DIAGNOSIS — F32A Depression, unspecified: Secondary | ICD-10-CM | POA: Diagnosis not present

## 2020-02-27 DIAGNOSIS — J9621 Acute and chronic respiratory failure with hypoxia: Secondary | ICD-10-CM | POA: Diagnosis not present

## 2020-02-27 DIAGNOSIS — U071 COVID-19: Secondary | ICD-10-CM | POA: Diagnosis not present

## 2020-02-27 DIAGNOSIS — J441 Chronic obstructive pulmonary disease with (acute) exacerbation: Secondary | ICD-10-CM | POA: Diagnosis not present

## 2020-02-27 DIAGNOSIS — M810 Age-related osteoporosis without current pathological fracture: Secondary | ICD-10-CM | POA: Diagnosis not present

## 2020-03-01 ENCOUNTER — Other Ambulatory Visit: Payer: Self-pay | Admitting: Primary Care

## 2020-03-02 DIAGNOSIS — U071 COVID-19: Secondary | ICD-10-CM | POA: Diagnosis not present

## 2020-03-02 DIAGNOSIS — M810 Age-related osteoporosis without current pathological fracture: Secondary | ICD-10-CM | POA: Diagnosis not present

## 2020-03-02 DIAGNOSIS — C3412 Malignant neoplasm of upper lobe, left bronchus or lung: Secondary | ICD-10-CM | POA: Diagnosis not present

## 2020-03-02 DIAGNOSIS — J9621 Acute and chronic respiratory failure with hypoxia: Secondary | ICD-10-CM | POA: Diagnosis not present

## 2020-03-02 DIAGNOSIS — M160 Bilateral primary osteoarthritis of hip: Secondary | ICD-10-CM | POA: Diagnosis not present

## 2020-03-02 DIAGNOSIS — J440 Chronic obstructive pulmonary disease with acute lower respiratory infection: Secondary | ICD-10-CM | POA: Diagnosis not present

## 2020-03-02 DIAGNOSIS — J1282 Pneumonia due to coronavirus disease 2019: Secondary | ICD-10-CM | POA: Diagnosis not present

## 2020-03-02 DIAGNOSIS — F32A Depression, unspecified: Secondary | ICD-10-CM | POA: Diagnosis not present

## 2020-03-02 DIAGNOSIS — J441 Chronic obstructive pulmonary disease with (acute) exacerbation: Secondary | ICD-10-CM | POA: Diagnosis not present

## 2020-03-04 ENCOUNTER — Telehealth: Payer: Self-pay | Admitting: Pulmonary Disease

## 2020-03-04 NOTE — Telephone Encounter (Signed)
03/04/2020  Would recommend that patient have hospital follow-up visit with Dr. Elsworth Soho or EW NP who she is seen before in the past.  If there is nothing available they can schedule with any other APP.  Wyn Quaker, FNP

## 2020-03-04 NOTE — Telephone Encounter (Signed)
Spoke with the pt  She is c/o "deep seal like cough" since 03/03/20  She states coughing up a moderate amount of white sputum, more than usual for her  She is not having any increased SOB or wheezing and has not had to use neb- has used albuterol inhaler x 1  She denies any f/c/s or new body aches- temp just now was 97.7 She states recently hospitalized for covid- dx on 02/04/20  She was vaccinated prior to this  Pt still on her trelegy, using flutter valve, singulair, and mucinex bid  Please advise, thanks

## 2020-03-04 NOTE — Telephone Encounter (Signed)
Called and spoke to pt. Informed her of the recs per Wyn Quaker, NP. Pt verbalized understanding and states she doesn't want to go the weekend without a visit. Pt states she is unable to come in for an appt as she doesn't have transportation. Video visit appt scheduled with Wyn Quaker, NP on 10/29. Pt aware to seek emergency care if any new or worsening s/s.

## 2020-03-05 ENCOUNTER — Encounter: Payer: Self-pay | Admitting: Pulmonary Disease

## 2020-03-05 ENCOUNTER — Telehealth (INDEPENDENT_AMBULATORY_CARE_PROVIDER_SITE_OTHER): Payer: Medicare HMO | Admitting: Pulmonary Disease

## 2020-03-05 DIAGNOSIS — J1282 Pneumonia due to coronavirus disease 2019: Secondary | ICD-10-CM | POA: Diagnosis not present

## 2020-03-05 DIAGNOSIS — J449 Chronic obstructive pulmonary disease, unspecified: Secondary | ICD-10-CM | POA: Diagnosis not present

## 2020-03-05 DIAGNOSIS — J9611 Chronic respiratory failure with hypoxia: Secondary | ICD-10-CM

## 2020-03-05 DIAGNOSIS — J4489 Other specified chronic obstructive pulmonary disease: Secondary | ICD-10-CM

## 2020-03-05 DIAGNOSIS — R059 Cough, unspecified: Secondary | ICD-10-CM | POA: Diagnosis not present

## 2020-03-05 DIAGNOSIS — U071 COVID-19: Secondary | ICD-10-CM

## 2020-03-05 DIAGNOSIS — Z8616 Personal history of COVID-19: Secondary | ICD-10-CM | POA: Insufficient documentation

## 2020-03-05 HISTORY — DX: Chronic respiratory failure with hypoxia: J96.11

## 2020-03-05 MED ORDER — BENZONATATE 200 MG PO CAPS: 200.0000 mg | ORAL_CAPSULE | Freq: Three times a day (TID) | ORAL | 3 refills | Status: DC | PRN

## 2020-03-05 NOTE — Progress Notes (Signed)
Virtual Visit via Video Note  I connected with Lori Butler on 03/05/20 at 10:00 AM EDT by a video enabled telemedicine application and verified that I am speaking with the correct person using two identifiers.  Location: Patient: Home Provider: Office - [facility] - [address], [address], [place], [place] [address]  I discussed the limitations of evaluation and management by telemedicine and the availability of in person appointments. The patient expressed understanding and agreed to proceed. I also discussed with the patient that there may be a patient responsible charge related to this service. The patient expressed understanding and agreed to proceed.  Patient consented to consult via telephone: Yes People present and their role in pt care: Pt   History of Present Illness:  69 year old female former smoker found our office for COPD and chronic respiratory failure.  Patient hospitalized for COVID-19 in September/2021.  Did not require mechanical ventilation.  Received remdesivir, steroids and oxygen.  Past medical history: Vitamin D deficiency, osteoporosis, physical deconditioning, anxiety, depression Smoking history: Smoker.  Quit 2019.  76.5-pack-year smoking history Maintenance: Trelegy Ellipta 200 Patient of Dr. Elsworth Soho   Chief complaint: Post Covid79   69 year old female former smoker followed in our office for COPD and chronic respiratory failure.  Patient is status post COVID-19 infection and hospitalization in September/2021.  She was discharged on 02/08/2020.  She was vaccinated for Covid prior to her infection.  The discharge summary from that hospitalization is listed below:  Discharged on 02/08/20  Admit date: 02/04/2020 Discharge date: 02/08/2020  Admitted From: Home Disposition: Home with home health and PT  Recommendations for Outpatient Follow-up:  1. Follow up with PCP in 1-2 weeks 2. Please obtain BMP/CBC in one week 3. 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  She is fully vaccinated against COVID-19.  In the emergency room she was tachycardic and tachypneic with respiratory 33.  Blood pressures were normal.  Procalcitonin was normal.  Chest x-ray showed mostly old scars.  She is on 2 L at baseline and she was short of breath on mobility with requirement of more than 3 L of oxygen.  Patient was admitted to the hospital because of increased requirement of oxygen, tachypnea.  Chest x-ray did not show any evidence of pneumonia, however patient had clear evidence of COPD exacerbation probably related to COVID-19 virus infection.  She was admitted to the hospital and treated with steroids, bronchodilator therapies, remdesivir for 5 days with very good clinical response.  Today patient is back to her usual health condition and mobilizing with 2 L of oxygen and using her walker.  Will discharge home with tapering dose of prednisone mainly to cover for COPD exacerbation.  Rest of the chronic medical issues remained stable.  She work with therapies and had impaired mobility and she will benefit with home health PT.   Patient completing virtual visit with our office today because she continues to have ongoing fatigue as well as cough.  She remains adherent to Trelegy Ellipta 200.  Her cough is  nonproductive.  She is on her baseline of 2 L of O2.  She reports that with physical exertion her oxygen levels on 2 L dropped to 90 to 91%.   When sitting she is at 97 to 98%.  She remains afebrile.  Temperature today is 97.2.  She denies any wheezing.  She is not currently using any sort of methods to suppress her cough.  We will discuss this today.   Observations/Objective:  Social History   Tobacco Use  Smoking Status Former Smoker  . Packs/day: 1.50  . Years: 51.00  . Pack years: 76.50  . Types: Cigarettes  . Quit date: 09/05/2017  . Years since quitting: 2.4  Smokeless Tobacco Never Used   Immunization History  Administered Date(s) Administered  . Influenza Split 01/07/2019  . Influenza, High Dose Seasonal PF 01/21/2018, 01/07/2019  . Influenza-Unspecified 01/07/2019  . Pneumococcal Conjugate-13 12/06/2017   April / 2021 - got pfizer   Assessment and Plan:  Pneumonia due to COVID-19 virus Status post hospitalization of COVID-19 in September/2021 Discharged on 02/08/2020  Plan: Continue oxygen therapy 4-week follow-up with chest x-ray in office [name] and Delsym for cough We will plan on pulmonary function testing in January/2022  COPD with asthma ([place]) Plan: continue Trelegy Ellipta 200 Continue rescue inhaler Continue oxygen therapy Follow-up in 4 weeks with chest x-ray We will plan on pulmonary function testing in January/2022  Chronic respiratory failure with hypoxia ([place]) Plan: Continue oxygen therapy  History of COVID-19 Hospitalized with Covid in September/2021 Received remdesivir Received COVID-19 vaccinations and April/2021  Patient has had persisting cough since being discharged from the hospital Ongoing fatigue that is slowly improving  Plan: Continue to monitor symptoms of fever, worsen sputum Obtain better control of cough management by starting over-the-counter Delsym as well as Ladona Ridgel We will hold off on narcotic cough syrup as patient has not tolerated opioids well in the past 4-week follow-up in our office with chest x-ray We will plan on pulmonary function  testing and January/2022   Cough Likely ongoing post Covid cough  Plan: We will obtain chest x-ray at next office visit in 4 weeks Start Delsym Can use [name]   Follow Up Instructions:  Return in about 4 weeks (around 04/02/2020), or if symptoms worsen or fail to improve, for Follow up with Wyn Quaker FNP-C, Follow up with Dr. Elsworth Soho, Follow up with Derl Barrow AGNP-BC.    I discussed the assessment and treatment plan with the patient. The patient was provided an opportunity to ask questions and all were answered. The patient agreed with the plan and demonstrated an understanding of the instructions.   The patient was advised to call back or seek an in-person evaluation if the symptoms worsen or if the condition fails to improve as anticipated.  I provided 32 minutes of non-face-to-face time during this encounter.   Lauraine Rinne, NP

## 2020-03-05 NOTE — Patient Instructions (Addendum)
You were seen today by Lauraine Rinne, NP  for:   1. History of COVID-19 2. Pneumonia due to COVID-19 virus  - benzonatate (TESSALON) 200 MG capsule; Take 1 capsule (200 mg total) by mouth 3 (three) times daily as needed for cough.  Dispense: 30 capsule; Refill: 3 - Pulmonary function test; Future  Please start using Delsym over-the-counter cough syrup every 12 hours for cough suppression as needed  Can use [name] every 8 hours as needed for cough suppression  We will see back in 4 weeks with a chest x-ray  We will order pulmonary function testing to further evaluate your breathing post Covid in 3 months  3. COPD with asthma ([place])  - Pulmonary function test; Future  Trelegy Ellipta 200 >>> 1 puff daily in the morning >>>rinse mouth out after use  >>> This inhaler contains 3 medications that help manage her respiratory status, contact our office if you cannot afford this medication or unable to remain on this medication  Only use your albuterol as a rescue medication to be used if you can't catch your breath by resting or doing a relaxed purse lip breathing pattern.  - The less you use it, the better it will work when you need it. - Ok to use up to 2 puffs  every 4 hours if you must but call for immediate appointment if use goes up over your usual need - Don't leave home without it !!  (think of it like the spare tire for your car)   Note your daily symptoms > remember "red flags" for COPD:   >>>Increase in cough >>>increase in sputum production >>>increase in shortness of breath or activity  intolerance.   If you notice these symptoms, please call the office to be seen.    4. Chronic respiratory failure with hypoxia (HCC)  Continue oxygen therapy as prescribed  >>>maintain oxygen saturations greater than 88 percent  >>>if unable to maintain oxygen saturations please contact the office  >>>do not smoke with oxygen  >>>can use nasal saline gel or nasal saline rinses  to moisturize nose if oxygen causes dryness   5. Cough  Cough Home Instructions:  Marland Kitchen Goal is to not Cough or clear throat.  Marland Kitchen Avoid coughing or clearing throat by using:  o non-mint products/sugarless candy o Water o ice chips o Remember NO MINT PRODUCTS  . Medications to use:  o Mucinex DM 1-2 every 12 hrs or Delsym 2 tsp every 12 hrs for cough (These are Over the counter) o Tessalon Three times a day  As needed  Cough.      We recommend today:  Orders Placed This Encounter  Procedures  . DG Chest 2 View    Standing Status:   Future    Standing Expiration Date:   07/05/2020    Order Specific Question:   Reason for Exam (SYMPTOM  OR DIAGNOSIS REQUIRED)    Answer:   post covid    Order Specific Question:   Preferred imaging location?    Answer:   Internal    Order Specific Question:   Radiology Contrast Protocol - do NOT remove file path    Answer:   [email or web address]  . Pulmonary function test    Standing Status:   Future    Standing Expiration Date:   03/05/2021    Scheduling Instructions:     Jan/2022    Order Specific Question:   Where should this test be performed?    Answer:  [facility]    Order Specific Question:   Full PFT: includes the following: basic spirometry, spirometry pre & post bronchodilator, diffusion capacity (DLCO), lung volumes    Answer:   Full PFT   Orders Placed This Encounter  Procedures  . DG Chest 2 View  . Pulmonary function test   Meds ordered this encounter  Medications  . benzonatate (TESSALON) 200 MG capsule    Sig: Take 1 capsule (200 mg total) by mouth 3 (three) times daily as needed for cough.    Dispense:  30 capsule    Refill:  3    Follow Up:    Return in about 4 weeks (around 04/02/2020), or if symptoms worsen or fail to improve, for Follow up with Wyn Quaker FNP-C, Follow up with Dr. Elsworth Soho, Follow up with Derl Barrow AGNP-BC.   Notification of test results are  managed in the following manner: If there are  any recommendations or changes to the  plan of care discussed in office today,  we will contact you and let you know what they are. If you do not hear from [place], then your results are normal and you can view them through your  MyChart account , or a letter will be sent to you. Thank you again for trusting [place] with your care  - Thank you, [facility]    It is flu season:   >>> Best ways to protect herself from the flu: Receive the yearly flu vaccine, practice good hand hygiene washing with soap and also using hand sanitizer when available, eat a nutritious meals, get adequate rest, hydrate appropriately       Please contact the office if your symptoms worsen or you have concerns that you are not improving.   Thank you for choosing [facility] for your healthcare, and for allowing [place] to partner with you on your healthcare journey. I am thankful to be able to provide care to you today.   Wyn Quaker FNP-C

## 2020-03-05 NOTE — Assessment & Plan Note (Signed)
Likely ongoing post Covid cough  Plan: We will obtain chest x-ray at next office visit in 4 weeks Start Delsym Can use [name]

## 2020-03-05 NOTE — Assessment & Plan Note (Signed)
Hospitalized with Covid in September/2021 Received remdesivir Received COVID-19 vaccinations and April/2021  Patient has had persisting cough since being discharged from the hospital Ongoing fatigue that is slowly improving  Plan: Continue to monitor symptoms of fever, worsen sputum Obtain better control of cough management by starting over-the-counter Delsym as well as Ladona Ridgel We will hold off on narcotic cough syrup as patient has not tolerated opioids well in the past 4-week follow-up in our office with chest x-ray We will plan on pulmonary function testing and January/2022

## 2020-03-05 NOTE — Assessment & Plan Note (Signed)
Plan: Continue oxygen therapy 

## 2020-03-05 NOTE — Assessment & Plan Note (Signed)
Plan: continue Trelegy Ellipta 200 Continue rescue inhaler Continue oxygen therapy Follow-up in 4 weeks with chest x-ray We will plan on pulmonary function testing in January/2022

## 2020-03-05 NOTE — Assessment & Plan Note (Signed)
Status post hospitalization of COVID-19 in September/2021 Discharged on 02/08/2020  Plan: Continue oxygen therapy 4-week follow-up with chest x-ray in office [name] and Delsym for cough We will plan on pulmonary function testing in January/2022

## 2020-03-09 DIAGNOSIS — D649 Anemia, unspecified: Secondary | ICD-10-CM | POA: Diagnosis not present

## 2020-03-09 DIAGNOSIS — R6889 Other general symptoms and signs: Secondary | ICD-10-CM | POA: Diagnosis not present

## 2020-03-09 DIAGNOSIS — R748 Abnormal levels of other serum enzymes: Secondary | ICD-10-CM | POA: Diagnosis not present

## 2020-03-10 DIAGNOSIS — J449 Chronic obstructive pulmonary disease, unspecified: Secondary | ICD-10-CM | POA: Diagnosis not present

## 2020-03-10 DIAGNOSIS — C3412 Malignant neoplasm of upper lobe, left bronchus or lung: Secondary | ICD-10-CM | POA: Diagnosis not present

## 2020-03-15 ENCOUNTER — Other Ambulatory Visit: Payer: Self-pay

## 2020-03-15 NOTE — Patient Outreach (Signed)
[facility] [place]) Care Management  03/15/2020  Lori Butler 1950-12-17 [id number]   Telephone Assessment   Outreach attempt #1 to patient. Spoke with patient who reports he continues to recover from COVID-19. She shares that her breathing has been fine and denies any SOB. Coughing has improved since taking Tessalon Pearles. Her main issue at present is lingering weakness/fatigue. She reports appetite remains decreased. She has only been eating 1x/day. RN CM discussed with patient importance of small,freq meals and how decreased oral intake may be contributing to weakness/fatigue. Patient voiced understanding and will try eating small,freq meals to increase oral intake. She completed virtual visit with pulmonologist. She reports she has gotten her flu shot. She plans to make appt within the month to get COVID-19 third shot. She goes on Dec 3rd for chest x-ray and has her PET scan scheduled for Dec 6th. She denies any RN CM needs or concerns at this time.    Medications Reviewed Today    Reviewed by Hayden Pedro, RN (Registered Nurse) on 03/15/20 at [phone]  Med List Status: <None>  Medication Order Taking? Sig Documenting Provider Last Dose Status Informant  albuterol (PROVENTIL HFA;VENTOLIN HFA) 108 (90 Base) MCG/ACT inhaler [id number] No Inhale 2 puffs into the lungs every 6 (six) hours as needed for wheezing or shortness of breath. Rigoberto Noel, MD 02/03/2020 Unknown time Active Self  albuterol (PROVENTIL) (2.5 MG/3ML) 0.083% nebulizer solution [id number] No Take 3 mLs (2.5 mg total) by nebulization every 6 (six) hours as needed for wheezing or shortness of breath. Rigoberto Noel, MD Past Month Unknown time Active Self  alendronate (FOSAMAX) 70 MG tablet [id number] No Take 70 mg by mouth every Wednesday.  [provider] 02/04/2020 Unknown time Active Self           Med Note Leanord Asal, TREVINA M   Mon Dec 24, 2017  2:52 PM)    atorvastatin (LIPITOR) 10 MG tablet  [id number] No Take 10 mg by mouth daily. [provider] 02/04/2020 Unknown time Active Self  benzonatate (TESSALON) 200 MG capsule [id number]  Take 1 capsule (200 mg total) by mouth 3 (three) times daily as needed for cough. Lauraine Rinne, NP  Active   Cholecalciferol (VITAMIN D) 50 MCG (2000 UT) tablet [id number] No Take 2,000 Units by mouth daily. [provider] 02/04/2020 Unknown time Active Self  Cyanocobalamin (VITAMIN B-12) 5000 MCG TBDP [id number] No Take 1 tablet by mouth daily. [provider] 02/04/2020 Unknown time Active Self  Fluticasone-Umeclidin-Vilant (TRELEGY ELLIPTA) 200-62.5-25 MCG/INH AEPB [id number]  INHALE 1 PUFF INTO THE LUNGS EVERY DAY Rigoberto Noel, MD  Active   guaiFENesin (MUCINEX) 600 MG 12 hr tablet [id number] No Take 2 tablets (1,200 mg total) by mouth 2 (two) times daily.  Patient taking differently: Take 600 mg by mouth 2 (two) times daily as needed for cough.    Martyn Ehrich, NP 02/03/2020 Unknown time Active Self  montelukast (SINGULAIR) 10 MG tablet [id number] No Take 1 tablet (10 mg total) by mouth at bedtime. Arnell Asal, NP 02/03/2020 Unknown time Active Self  naproxen (NAPROSYN) 375 MG tablet [id number] No Take 1 tablet (375 mg total) by mouth 2 (two) times daily. Nat Christen, MD Taking Active Self  pantoprazole (PROTONIX) 40 MG tablet [id number] No Take 1 tablet (40 mg total) by mouth 2 (two) times daily.  Patient taking differently: Take 40 mg by mouth daily.    Arnell Asal, NP Taking Active Self  predniSONE (DELTASONE)  10 MG tablet [id number]  4 tabs daily for 3 days 3 tabs daily for 3 days 2 tabs daily for 3 days 1 tab daily for 3 days Barb Merino, MD  Active   sertraline (ZOLOFT) 50 MG tablet [id number] No Take 1 tablet (50 mg total) by mouth daily. [name], MD 02/04/2020 Unknown time Active Self         Goals    . Manage Fatigue (Tiredness)     Follow Up Date *Dec 2021   - eat healthy - get at least 7 to 8  hours of sleep at night - use devices that will help like a cane, sock-puller or reacher  Patient will verbalize actively participating with HHPT and report increase in mobility/strength   Why is this important?   Feeling tired or worn out is a common symptom of COPD (chronic obstructive pulmonary disease).  Learning when you feel your best and when you need rest is important.  Managing the tiredness (fatigue) will help you be active and enjoy life.     Notes:     . Track and Manage My Symptoms     Follow Up Date Nov 2021   - arrange in-home help services - develop a rescue plan - follow rescue plan if symptoms flare-up - keep follow-up appointments    Why is this important?   Tracking your symptoms and other information about your health helps your doctor plan your care.  Write down the symptoms, the time of day, what you were doing and what medicine you are taking.  You will soon learn how to manage your symptoms.     Notes:  03/15/20-Patient continues to report ongoing weakness/fatigue. Resp sxs remain managed/controlled at present.     . Track and Manage My Triggers     Follow Up Date Nov 2021   - limit outdoor activity during cold weather -patient will be able to verbalize at least 2-3 ways to manage resp status and prevent excerbation    Why is this important?   Triggers are activities or things, like tobacco smoke or cold weather, that make your COPD (chronic obstructive pulmonary disease) flare-up.  Knowing these triggers helps you plan how to stay away from them.  When you cannot remove them, you can learn how to manage them.     Notes:  03/15/20-Patient reports resp status managed/improved. Main concern at present is weakness/fatigue      Goals Addressed            This Visit's Progress   . Track and Manage My Symptoms       Follow Up Date Nov 2021   - arrange in-home help services - develop a rescue plan - follow rescue plan if symptoms flare-up -  keep follow-up appointments    Why is this important?   Tracking your symptoms and other information about your health helps your doctor plan your care.  Write down the symptoms, the time of day, what you were doing and what medicine you are taking.  You will soon learn how to manage your symptoms.     Notes:  03/15/20-Patient continues to report ongoing weakness/fatigue. Resp sxs remain managed/controlled at present.     . Track and Manage My Triggers       Follow Up Date Nov 2021   - limit outdoor activity during cold weather -patient will be able to verbalize at least 2-3 ways to manage resp status and prevent excerbation    Why  is this important?   Triggers are activities or things, like tobacco smoke or cold weather, that make your COPD (chronic obstructive pulmonary disease) flare-up.  Knowing these triggers helps you plan how to stay away from them.  When you cannot remove them, you can learn how to manage them.     Notes:  03/15/20-Patient reports resp status managed/improved. Main concern at present is weakness/fatigue        Plan:  RN CM discussed with patient next outreach within the month of Jan. Patient gave verbal consent and in agreement with RN CM follow up and timeframe. Patient aware that they may contact RN CM sooner for any issues or concerns. RN CM will send quarterly update to PCP.  [name], RN,BSN,CCM [facility] Telephonic Care Management Coordinator Direct Phone: [phone] Toll Free: [phone] Fax: [phone]

## 2020-04-05 ENCOUNTER — Other Ambulatory Visit: Payer: Self-pay | Admitting: Radiation Oncology

## 2020-04-09 ENCOUNTER — Other Ambulatory Visit: Payer: Self-pay

## 2020-04-09 ENCOUNTER — Encounter: Payer: Self-pay | Admitting: Pulmonary Disease

## 2020-04-09 ENCOUNTER — Ambulatory Visit (INDEPENDENT_AMBULATORY_CARE_PROVIDER_SITE_OTHER): Payer: Medicare HMO

## 2020-04-09 ENCOUNTER — Ambulatory Visit: Payer: Medicare HMO | Admitting: Pulmonary Disease

## 2020-04-09 DIAGNOSIS — C3412 Malignant neoplasm of upper lobe, left bronchus or lung: Secondary | ICD-10-CM | POA: Diagnosis not present

## 2020-04-09 DIAGNOSIS — J1282 Pneumonia due to coronavirus disease 2019: Secondary | ICD-10-CM | POA: Diagnosis not present

## 2020-04-09 DIAGNOSIS — J4489 Other specified chronic obstructive pulmonary disease: Secondary | ICD-10-CM

## 2020-04-09 DIAGNOSIS — J449 Chronic obstructive pulmonary disease, unspecified: Secondary | ICD-10-CM | POA: Diagnosis not present

## 2020-04-09 DIAGNOSIS — U071 COVID-19: Secondary | ICD-10-CM | POA: Diagnosis not present

## 2020-04-09 DIAGNOSIS — Z8616 Personal history of COVID-19: Secondary | ICD-10-CM

## 2020-04-09 DIAGNOSIS — J90 Pleural effusion, not elsewhere classified: Secondary | ICD-10-CM | POA: Diagnosis not present

## 2020-04-09 DIAGNOSIS — R6889 Other general symptoms and signs: Secondary | ICD-10-CM | POA: Diagnosis not present

## 2020-04-09 DIAGNOSIS — J9611 Chronic respiratory failure with hypoxia: Secondary | ICD-10-CM | POA: Diagnosis not present

## 2020-04-09 NOTE — Assessment & Plan Note (Signed)
Oxygen requirements are back down to baseline 2 L on exertion

## 2020-04-09 NOTE — Progress Notes (Signed)
   Subjective:    Patient ID: Lori Butler, female    DOB: 05-24-1950, 69 y.o.   MRN: [id number]  HPI  yoex-smokerfor FU of moderate COPD She quit smoking 09/2017 Sudden onset and variability suggests a component of asthma  She had 2 hospitalizations 12/2017 for acute hypercarbic respiratory failure with change in mental status. Each time she improved quickly and did not require oxygen on discharge  She underwent navigation biopsy of left upper lobe nodule showing atypical cells and then underwent SBRT 12/2018    Chief Complaint  Patient presents with  . Follow-up    Patient was diagnosed with Covid on 9/27 and was admitted to the hospital on 9/29. She wears 2 liters oxygen all the time. States that she is feeling better and her energy is slowly picking back up.    Recent flares Admitted from 10/23-10/26/20 for COPD exacerbation  Feb 2021 Adm 9/29-10/3 for covid , fortunately did okay, was able to taper back down to her usual dose of 2 L of oxygen on discharge.  Breathing is back to baseline.  She does have a problem with smell and had to throw away food. She was scheduled for a CT scan in October and this has been rescheduled  Significant tests/ events reviewed  CT chest 07/2019 Radiation changes in the medial left upper lobe. Underlying 8 x 14 mm nodule, unchanged.  CT chest 10/14/2018-bilateral upper lobe parenchymal scarring on her prior CT and left upper lobe scar that appeared more nodular on the CT measuring about 13 mm PET scan showed hypermetabolism with SUV 8 in this nodule   PFTs6/2020showed moderate reversible airway obstruction with FEV1 of 37%-0.91 which improved to 47%-1.1L.DLCO was about 54%  CT angio 09/2017 >>Emphysema andInspissated mucus in the LEFT LOWER lobe bronchi,mild fibrotic changes upper lobes  PFTs 10/2017-moderate airway obstruction, ratio 59, FEV1 51%, FVC 65%, no significantBDresponse, DLCO 57%  Review of Systems neg for  any significant sore throat, dysphagia, itching, sneezing, nasal congestion or excess/ purulent secretions, fever, chills, sweats, unintended wt loss, pleuritic or exertional cp, hempoptysis, orthopnea pnd or change in chronic leg swelling. Also denies presyncope, palpitations, heartburn, abdominal pain, nausea, vomiting, diarrhea or change in bowel or urinary habits, dysuria,hematuria, rash, arthralgias, visual complaints, headache, numbness weakness or ataxia.     Objective:   Physical Exam  Gen. Pleasant, well-nourished,elderly, in no distress ENT - no thrush, no pallor/icterus,no post nasal drip Neck: No JVD, no thyromegaly, no carotid bruits Lungs: no use of accessory muscles, no dullness to percussion, clear without rales or rhonchi  Cardiovascular: Rhythm regular, heart sounds  normal, no murmurs or gallops, no peripheral edema Musculoskeletal: No deformities, no cyanosis or clubbing        Assessment & Plan:

## 2020-04-09 NOTE — Patient Instructions (Addendum)
You are recovering well  Stay on trelegy Covid booster shot in January CT chest in December Rx for prednisone pending in your pharmacy

## 2020-04-09 NOTE — Assessment & Plan Note (Signed)
She underwent SBRT in August 2020. We will continue with surveillance CT, this has been rescheduled for December

## 2020-04-09 NOTE — Progress Notes (Signed)
Seeing you in clinic today.  FYI.Wyn Quaker, FNP

## 2020-04-09 NOTE — Assessment & Plan Note (Signed)
Continue Trelegy. We will keep prescription for prednisone pending in her pharmacy as before.  This plan has worked well for her

## 2020-04-09 NOTE — Assessment & Plan Note (Signed)
Chest x-ray reviewed today which shows no new infiltrates. She seems to have fully recovered. Covid booster shot can be deferred until January since she was boosted by natural infection

## 2020-04-12 ENCOUNTER — Ambulatory Visit: Payer: Self-pay | Admitting: Radiation Oncology

## 2020-04-26 ENCOUNTER — Telehealth: Payer: Self-pay | Admitting: Pulmonary Disease

## 2020-04-26 MED ORDER — PREDNISONE 10 MG PO TABS: ORAL_TABLET | ORAL | 0 refills | Status: DC

## 2020-04-26 NOTE — Telephone Encounter (Signed)
Called and spoke with patient pt states that Dr. Elsworth Soho has a predisone RX pending and was told to call in if needed it - states she is having chest tightness, hard time maintaining O2 levels wearing 2 liters she is 93-94%, states she went to 89% when she was loading the dishwasher but it came back up when she sat down,cough with clear sputum that started 3 days ago. Denies fever  Dr. Elsworth Soho please advise

## 2020-04-26 NOTE — Telephone Encounter (Signed)
° °  Prednisone 10 mg tabs Take 4 tabs  daily with food x 4 days, then 3 tabs daily x 4 days, then 2 tabs daily x 4 days, then 1 tab daily x4 days then stop. #40 ° °

## 2020-04-26 NOTE — Telephone Encounter (Signed)
Spoke with the pt and notified of response per Dr Elsworth Soho  I have sent rx for pred  Pt instructed to call if not improving

## 2020-05-06 ENCOUNTER — Encounter (HOSPITAL_COMMUNITY): Payer: Self-pay

## 2020-05-06 ENCOUNTER — Other Ambulatory Visit: Payer: Self-pay

## 2020-05-06 ENCOUNTER — Ambulatory Visit (HOSPITAL_COMMUNITY)
Admission: RE | Admit: 2020-05-06 | Discharge: 2020-05-06 | Disposition: A | Discharge status: Home / Self Care | Payer: Medicare HMO | Source: Ambulatory Visit | Attending: Radiation Oncology | Admitting: Radiation Oncology

## 2020-05-06 DIAGNOSIS — C3490 Malignant neoplasm of unspecified part of unspecified bronchus or lung: Secondary | ICD-10-CM | POA: Diagnosis not present

## 2020-05-06 DIAGNOSIS — I2510 Atherosclerotic heart disease of native coronary artery without angina pectoris: Secondary | ICD-10-CM | POA: Diagnosis not present

## 2020-05-06 DIAGNOSIS — C3412 Malignant neoplasm of upper lobe, left bronchus or lung: Secondary | ICD-10-CM | POA: Insufficient documentation

## 2020-05-06 DIAGNOSIS — J432 Centrilobular emphysema: Secondary | ICD-10-CM | POA: Diagnosis not present

## 2020-05-06 DIAGNOSIS — R6889 Other general symptoms and signs: Secondary | ICD-10-CM | POA: Diagnosis not present

## 2020-05-06 LAB — POCT I-STAT CREATININE: Creatinine, Ser: 0.6 mg/dL (ref 0.44–1.00)

## 2020-05-06 MED ORDER — IOHEXOL 300 MG/ML  SOLN
75.0000 mL | Freq: Once | INTRAMUSCULAR | Status: AC | PRN
  Administered 2020-05-06: 75 mL via INTRAVENOUS

## 2020-05-10 ENCOUNTER — Other Ambulatory Visit: Payer: Self-pay

## 2020-05-10 ENCOUNTER — Telehealth: Payer: Self-pay | Admitting: Radiation Oncology

## 2020-05-10 DIAGNOSIS — C3412 Malignant neoplasm of upper lobe, left bronchus or lung: Secondary | ICD-10-CM | POA: Diagnosis not present

## 2020-05-10 DIAGNOSIS — J449 Chronic obstructive pulmonary disease, unspecified: Secondary | ICD-10-CM | POA: Diagnosis not present

## 2020-05-10 NOTE — Patient Outreach (Signed)
[facility]) Care Management  05/10/2020  Paislie Tessler Reidel 1950/08/05 [id number]   Telephone Assessment   Unsuccessful outreach attempt to patient. RN CM left HIPAA compliant voicemail along with contact info.      Plan: RN CM will make outreach attempt to patient within the month of March if no return call from patient.   [name], RN,BSN,CCM [place] [facility] Telephonic Care Management Coordinator Direct Phone: [phone] Toll Free: [phone] Fax: [phone]

## 2020-05-10 NOTE — Telephone Encounter (Signed)
  Radiation Oncology         [phone]) [phone] ________________________________  Name: Lori Butler MRN: [id number]  Date of Service: 05/10/2020  DOB: [birth year]  Post Treatment Telephone Note  Diagnosis:  Putative Stage IA2, cT1bN0M0 NSCLC of the LUL.  Interval Since Last Radiation:  5 months  12/03/2018-12/10/2018 SBRT Treatment: The patient was treated to the left lung with a course of stereotactic body radiation treatment.  The patient received 54 Gray in 3 fractions using a IMRT technique, with 3 fields.  Narrative:  The patient was contacted today for routine follow-up. She was originally found to have a putative stage I lung cancer by imaging in the summer of 2020. Bronchoscopy showed atypical cells, but were not definitive. PET imaging was suspicious with an SUV of 8.4 in the LUL lesion. She was not a candidate for surgery given her pulmonary function, but was offered stereotactic body radiotherapy (SBRT) which she recieved several months ago. She tolerated this and has had a nice response to treatment. A recent CT Chest on 05/06/20 showed improvement in the overall measurement of the LUL nodule now measuring 7 x 9 mm and consistent with post radiation treatment effect. No nodules were seen in the LLL which had been an area we wanted to follow since she had a lesion there at the time of her diagnosis. She continues to have stigmata of atherosclerosis and emphysematous changes of the lungs. She feels that she is doing well and denies any new shortness of breath. She denies any chest pain. She is trying to wean her O2. She is currently using 1-2 L O2 continuously.   Impression/Plan: 1. Putative Stage IA2, cT1bN0M0 NSCLC of the LUL. The patient has been doing well since completion of radiotherapy. We discussed the results of her recent CT scan and that per NCCN guidelines it would be recommended to repeat this again in 6 months time. She is aware that we will plan to have a visit to review this at  that interval and that she should continue to follow up with Dr. Elsworth Soho and his team. 2. O2 depend COPD/Asthma. The patient continues to do well and is motivated to try to wean her O2 further. She will follow up with pulmonary medicine regarding management of this as above.     Carola Rhine, PAC

## 2020-05-11 ENCOUNTER — Ambulatory Visit: Payer: Self-pay

## 2020-05-12 DIAGNOSIS — K743 Primary biliary cirrhosis: Secondary | ICD-10-CM | POA: Diagnosis not present

## 2020-05-31 ENCOUNTER — Telehealth: Payer: Self-pay | Admitting: Pulmonary Disease

## 2020-05-31 NOTE — Telephone Encounter (Signed)
Called Melissa at [employer] to see exactly what needs to be done for this patient as she has been with [facility] for a while.  Will wait for her phone call back

## 2020-05-31 NOTE — Telephone Encounter (Signed)
Received call back from [facility] she states patient needs to be requalified for oxygen with an office visit and notes and new order.   Called spoke with patient, she needs a 3 day notice for insurance purposes. She is scheduled for 06/04/20 at 9 am with Derl Barrow NP.  Messaged back to [facility] letting her know this appointment was set up so oxygen would not be taken away from patient.   Nothing further needed at this time

## 2020-06-02 DIAGNOSIS — D510 Vitamin B12 deficiency anemia due to intrinsic factor deficiency: Secondary | ICD-10-CM | POA: Diagnosis not present

## 2020-06-02 DIAGNOSIS — I2510 Atherosclerotic heart disease of native coronary artery without angina pectoris: Secondary | ICD-10-CM | POA: Diagnosis not present

## 2020-06-02 DIAGNOSIS — J449 Chronic obstructive pulmonary disease, unspecified: Secondary | ICD-10-CM | POA: Diagnosis not present

## 2020-06-02 DIAGNOSIS — F322 Major depressive disorder, single episode, severe without psychotic features: Secondary | ICD-10-CM | POA: Diagnosis not present

## 2020-06-02 DIAGNOSIS — J441 Chronic obstructive pulmonary disease with (acute) exacerbation: Secondary | ICD-10-CM | POA: Diagnosis not present

## 2020-06-02 DIAGNOSIS — K219 Gastro-esophageal reflux disease without esophagitis: Secondary | ICD-10-CM | POA: Diagnosis not present

## 2020-06-02 DIAGNOSIS — C3412 Malignant neoplasm of upper lobe, left bronchus or lung: Secondary | ICD-10-CM | POA: Diagnosis not present

## 2020-06-02 DIAGNOSIS — E7800 Pure hypercholesterolemia, unspecified: Secondary | ICD-10-CM | POA: Diagnosis not present

## 2020-06-02 DIAGNOSIS — M810 Age-related osteoporosis without current pathological fracture: Secondary | ICD-10-CM | POA: Diagnosis not present

## 2020-06-04 ENCOUNTER — Encounter: Payer: Self-pay | Admitting: Primary Care

## 2020-06-04 ENCOUNTER — Other Ambulatory Visit: Payer: Self-pay

## 2020-06-04 ENCOUNTER — Ambulatory Visit: Payer: Medicare HMO | Admitting: Primary Care

## 2020-06-04 VITALS — BP 118/74 | HR 85 | Temp 97.2°F | Ht 66.0 in | Wt 179.0 lb

## 2020-06-04 DIAGNOSIS — J449 Chronic obstructive pulmonary disease, unspecified: Secondary | ICD-10-CM

## 2020-06-04 DIAGNOSIS — J9611 Chronic respiratory failure with hypoxia: Secondary | ICD-10-CM

## 2020-06-04 DIAGNOSIS — R911 Solitary pulmonary nodule: Secondary | ICD-10-CM

## 2020-06-04 DIAGNOSIS — R6889 Other general symptoms and signs: Secondary | ICD-10-CM | POA: Diagnosis not present

## 2020-06-04 MED ORDER — PREDNISONE 10 MG PO TABS
ORAL_TABLET | ORAL | 0 refills | Status: DC
Start: 2020-06-04 — End: 2020-08-09

## 2020-06-04 NOTE — Assessment & Plan Note (Signed)
-   CT chest in December 2021 showed radiation changes in the left upper lobe. Underlying 8 mm (mean diameter) left upper lobe nodule, improved. No findings suspicious for metastatic disease.

## 2020-06-04 NOTE — Assessment & Plan Note (Addendum)
-   Stable interval; She is doing quite well. No recent hospitalizations. She reports improvement in her breathing since being on Trelegy. Prednisone taper on hand for exacerbation which has worked well. CAT score 16/40.   Recommendations: - Continue Trelegy 200 one puff daily - Continue flutter valve prn congestion  Follow-up - 3 months

## 2020-06-04 NOTE — Progress Notes (Signed)
@Patient  ID: Lori Butler, female    DOB: Jan 07, 1951, 70 y.o.   MRN: [id number]  Chief Complaint  Patient presents with  . Follow-up    Needs to be reassessed for O2 needed. Uses [employer] for her O2. Uses 2L 24/7. States she has been stable since last visit.     Referring provider: [name], MD  HPI:   70 year old female, former smoker quit May 2019 (74 pack year hx). PMH significant for COPD, malignant neoplasm upper lobe left lung, hypoxia, anxiety/depression, attempted suicide, tobacco abuse. Patient of Dr. Elsworth Soho. Eosinophils absolute in August 2019 were 700. Sudden onset and variability seem to suggest component of asthma. Maintained on Trelegy.  LDCT showed lung RADS 4x. PET scan June 2020 positive LUL hypermetabolic spiculated pulmonary nodules consistent with primary bronchogenic carcinoma. Referred to CT surgery for second opinion. Seen by Dr. Roxan Hockey on 10/29/18. 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 Patient not interested in surgery. Underwent navigation bronchoscopy for bx and fiducial placement July 2020.  Pathology and cytology were non-diagnostic with atypical cells no malignant cells. Case discussed at [facility], felt patient could continue observation versus stereotactic therapy. Patient did not wish to wait and wanted to proceed with radiation therapy despite definitive carcinoma. She began SBRT 12/03/18 and has had three treatments total. Last treatment 8/4. 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 Treated with doxycycline course and extended prednisone taper. Follow-up CT chest showed LUL nodule 1.2 cm unchanged from previous, no new pulmonary nodules/masses. Moderate to advanced changes parseptal and centrilobular emphysema. Biapical pleuroparenchymal scarring. No axillary or supraclavicular adenopathy. No mediastinal or hilar adenopathy. Per oncology they are pleased with her post treatment imaging and will see her  in 6 months for continued evaluation.   Previous [facility] pulmonary encounters: 03/20/2019  Patient presents today for hospital follow-up. Admitted from 10/23-10/26 for COPD exacerbation treated with steroids, nebulizer's and qualified for supplemental oxygen. She was covid negative. CTA negative for PE. She was also treated with course of antibiotics for RLE cellulitis. She is feeling ok but her breathing worsens when tapering down on prednisone. Feels she goes down hill when off of oral steroids. Reports wheezing and seasonal nasal rhinitis. She is wearing 2L continuous oxygen. Receiving physical therapy twice a week. Started on TRELEGY.   04/10/19 Office visit- COPD, Dr. Elsworth Soho.  Improved with Trelegy. Decreased prednisone 5mg  daily. FU in 4 months.   05/28/2019 Patient report barking cough x 2 days. She started to cough up purulent mucus today. Associated fatigue. She is eating and drinking normally. Since starting Trelegy she has not experienced chest tightness and feels she is breathing better. Continue to use 2L oxygen at night.  No significant shortness of breath or nocturnal symptoms. No recent sick contacts or known exposure to covid. Denies fever, chills, sweats, chest tightness, wheezing HA, N/V/D.   06/30/2019 Patient contacted today for acute video visit. Reports fatigue, body aches, low grade temp, sneezing, runny nose, PND, cough. Cough is productive with white mucus, no reports of purulent color or hemoptysis. Her symptoms started 3 days ago. She has been compliant with Trelegy 1 puff daily. Mild wheezing with no chest tightness. She takes mucinex 600mg  twice daily and Singulair at bedtime. She uses her albuterol nebulizer in the morning to keep her airways opening but has not required her rescue inhaler. Reports new right foot swelling, hx cellulitis in fall 2020 treated with clindamycin. Denies erythema or warmth. She has not been  on any recent antibiotics. Denies any known sick contacts. She  has not left her house in 2-3 weeks. She gets her groceries delivered. She did have one friend come visit last Tuesday but they wore a mask and have not been sick.   12/05/2019 Patient contacted today for acute visit. Reports chest congestion with thick white mucus starting this morning. Associated shortness of breath/wheezing. She has having an extremly hard time getting sputum up. Took her Trelegy inhaler this morning around 5am. She used her nebulizer and rescue inhaler both once today with some improvement. States that she is doing a little better. She takes 600mg  mucinex twice daily. Hasn't been using her flutter valve regularly. Last took prednisone she had on hand in June. Denies fever, chills, sweats, purulent mucus.   06/04/2020- Interim hx  Doing well, no acute complaints. Just recently finished on hand prednisone taper which clear up a cough she had around christmas. No recent hospitalizations except for when she had covid in September 2021. She is here today to re-qualify for oxygen. She uses 2L oxygen 24/7 except for when she showers. She is still using Trelegy which she states has made a huge difference. She uses blue flutter valve 3-4 times a week. CAT score 16/40    Allergies  Allergen Reactions  . Demerol [Meperidine] Other (See Comments)    "horrible reaction"  . Oxycodone-Acetaminophen Other (See Comments)    Upset stomach   . Tramadol Nausea And Vomiting    Immunization History  Administered Date(s) Administered  . Influenza Split 01/07/2019  . Influenza, High Dose Seasonal PF 01/21/2018, 01/07/2019  . Influenza-Unspecified 01/07/2019, 02/20/2020  . PFIZER(Purple Top)SARS-COV-2 Vaccination 08/14/2019, 09/05/2019  . Pneumococcal Conjugate-13 12/06/2017    Past Medical History:  Diagnosis Date  . Acute respiratory failure with hypoxia ([place])   . Anxiety   . Aortic atherosclerosis ([place])   . Arthritis    "a little in my hands probably" (10/31/2017)  . Asthma   . COPD  (chronic obstructive pulmonary disease) ([place])   . COPD exacerbation ([place])   . Depression   . History of kidney stones   . lung ca dx'd 10/2018  . Suicide attempt ([place]) 02/21/2018   overdosed on Wellbutrin    Tobacco History: Social History   Tobacco Use  Smoking Status Former Smoker  . Packs/day: 1.50  . Years: 51.00  . Pack years: 76.50  . Types: Cigarettes  . Quit date: 09/05/2017  . Years since quitting: 2.7  Smokeless Tobacco Never Used   Counseling given: Not Answered   Outpatient Medications Prior to Visit  Medication Sig Dispense Refill  . albuterol (PROVENTIL HFA;VENTOLIN HFA) 108 (90 Base) MCG/ACT inhaler Inhale 2 puffs into the lungs every 6 (six) hours as needed for wheezing or shortness of breath. 1 Inhaler 2  . albuterol (PROVENTIL) (2.5 MG/3ML) 0.083% nebulizer solution Take 3 mLs (2.5 mg total) by nebulization every 6 (six) hours as needed for wheezing or shortness of breath. 75 mL 6  . alendronate (FOSAMAX) 70 MG tablet Take 70 mg by mouth every Wednesday.     Marland Kitchen atorvastatin (LIPITOR) 10 MG tablet Take 10 mg by mouth daily.    . benzonatate (TESSALON) 200 MG capsule Take 1 capsule (200 mg total) by mouth 3 (three) times daily as needed for cough. 30 capsule 3  . Cholecalciferol (VITAMIN D) 50 MCG (2000 UT) tablet Take 2,000 Units by mouth daily.    . Cyanocobalamin (VITAMIN B-12) 5000 MCG TBDP Take 1 tablet by  mouth daily.    . Ferrous Sulfate (IRON PO) Take 325 mg by mouth daily.    . Fluticasone-Umeclidin-Vilant (TRELEGY ELLIPTA) 200-62.5-25 MCG/INH AEPB INHALE 1 PUFF INTO THE LUNGS EVERY DAY 180 each 1  . guaiFENesin (MUCINEX) 600 MG 12 hr tablet Take 2 tablets (1,200 mg total) by mouth 2 (two) times daily. (Patient taking differently: Take 600 mg by mouth 2 (two) times daily as needed for cough.) 30 tablet 0  . montelukast (SINGULAIR) 10 MG tablet Take 1 tablet (10 mg total) by mouth at bedtime. 30 tablet 1  . pantoprazole (PROTONIX) 40 MG tablet Take 1 tablet  (40 mg total) by mouth 2 (two) times daily. (Patient taking differently: Take 40 mg by mouth daily.) 60 tablet 1  . sertraline (ZOLOFT) 50 MG tablet Take 1 tablet (50 mg total) by mouth daily. 30 tablet 0  . predniSONE (DELTASONE) 10 MG tablet 4 x 4 days, 3 x 4 days, 3 x 4 days, 2 x 4 days, 1 x 4 days then stop 40 tablet 0   No facility-administered medications prior to visit.      Review of Systems  Review of Systems  Constitutional: Negative.   Respiratory: Negative for cough, chest tightness, shortness of breath and wheezing.      Physical Exam  BP 118/74   Pulse 85   Temp (!) 97.2 F (36.2 C) (Temporal)   Ht 5\' 6"  (1.676 m)   Wt 179 lb (81.2 kg)   SpO2 100% Comment: on 2L  BMI 28.89 kg/m  Physical Exam Constitutional:      Appearance: Normal appearance.  HENT:     Head: Normocephalic and atraumatic.  Cardiovascular:     Rate and Rhythm: Normal rate and regular rhythm.  Pulmonary:     Effort: Pulmonary effort is normal.     Breath sounds: Normal breath sounds.     Comments: Lungs are mostly clear; faint rales LUL Musculoskeletal:     Comments: Uses rolling walker  Neurological:     General: No focal deficit present.     Mental Status: She is alert and oriented to person, place, and time. Mental status is at baseline.  Psychiatric:        Mood and Affect: Mood normal.        Behavior: Behavior normal.        Thought Content: Thought content normal.        Judgment: Judgment normal.      Lab Results:  CBC    Component Value Date/Time   WBC 5.4 02/08/2020 0544   RBC 4.06 02/08/2020 0544   HGB 11.4 (L) 02/08/2020 0544   HCT 35.8 (L) 02/08/2020 0544   PLT 233 02/08/2020 0544   MCV 88.2 02/08/2020 0544   MCH 28.1 02/08/2020 0544   MCHC 31.8 02/08/2020 0544   RDW 13.5 02/08/2020 0544   LYMPHSABS 2.6 02/08/2020 0544   MONOABS 0.8 02/08/2020 0544   EOSABS 0.0 02/08/2020 0544   BASOSABS 0.0 02/08/2020 0544    BMET    Component Value Date/Time   NA  140 02/08/2020 0544   NA 140 11/06/2017 0000   K 3.7 02/08/2020 0544   CL 108 02/08/2020 0544   CO2 22 02/08/2020 0544   GLUCOSE 75 02/08/2020 0544   BUN 27 (H) 02/08/2020 0544   BUN 17 11/06/2017 0000   CREATININE 0.60 05/06/2020 1217   CREATININE 0.72 08/01/2019 0920   CALCIUM 8.3 (L) 02/08/2020 0544   GFRNONAA >60 02/08/2020 0544  GFRNONAA >60 08/01/2019 0920   GFRAA >60 02/08/2020 0544   GFRAA >60 08/01/2019 0920    BNP    Component Value Date/Time   BNP 32.7 02/28/2019 2153    ProBNP No results found for: PROBNP  Imaging: CT CHEST W CONTRAST  Result Date: 05/06/2020 CLINICAL DATA:  Non-small cell lung cancer, XRT complete EXAM: CT CHEST WITH CONTRAST TECHNIQUE: Multidetector CT imaging of the chest was performed during intravenous contrast administration. CONTRAST:  43mL OMNIPAQUE IOHEXOL 300 MG/ML  SOLN COMPARISON:  08/01/2019 FINDINGS: Cardiovascular: Heart is normal in size. Small pericardial effusion. No evidence of thoracic aortic aneurysm. Atherosclerotic calcifications of the aortic arch. Mild coronary atherosclerosis of the LAD. Mediastinum/Nodes: No suspicious mediastinal lymphadenopathy. Visualized thyroid is unremarkable. Lungs/Pleura: Right apical pleural-parenchymal scarring. Additional nodular scarring in the medial left lung apex (series 7/image 18), unchanged. Fiducial markers in the left upper lobe with radiation changes. Underlying 7 x 9 mm nodule in the medial left upper lobe (series 7/image 28), previously 8 x 14 mm. Moderate centrilobular emphysematous changes, upper lobe predominant. No focal consolidation. Mild eventration of the right hemidiaphragm with associated right lower lobe atelectasis. No pleural effusion or pneumothorax. Upper Abdomen: Visualized upper abdomen is notable for a 12 mm central hepatic cyst and vascular calcifications. Musculoskeletal: Visualized osseous structures are within normal limits. IMPRESSION: Radiation changes in the left  upper lobe. Underlying 8 mm (mean diameter) left upper lobe nodule, improved. No findings suspicious for metastatic disease. Aortic Atherosclerosis (ICD10-I70.0) and Emphysema (ICD10-J43.9). Electronically Signed   By: Julian Hy M.D.   On: 05/06/2020 14:42     Assessment & Plan:   COPD with asthma ([place]) - Stable interval; She is doing quite well. No recent hospitalizations. She reports improvement in her breathing since being on Trelegy. Prednisone taper on hand for exacerbation which has worked well. CAT score 16/40.   Recommendations: - Continue Trelegy 200 one puff daily - Continue flutter valve prn congestion  Follow-up - 3 months    Chronic respiratory failure with hypoxia ([facility]) - Resting SpO2 86% RA, 96% 2L after 1 lap - She re-qualified for O2 today   Solitary pulmonary nodule on lung CT - CT chest in December 2021 showed radiation changes in the left upper lobe. Underlying 8 mm (mean diameter) left upper lobe nodule, improved. No findings suspicious for metastatic disease.   Martyn Ehrich, NP 06/04/2020

## 2020-06-04 NOTE — Patient Instructions (Addendum)
CT from December look good; radiation fibrosis LUL, 50mm nodule improved. Emphyseam and aortic atherosclerosis   Recommendations: Continue Trelegy 1 puff daily (rinse mouth after use) Take as needed prednisone for flare up of COPD symptoms Use flutter valve prn chest congestion  Orders: Renew oxygen at 2L/min  Follow-up: 3 months with Dr. Elsworth Soho or Eustaquio Maize NP

## 2020-06-04 NOTE — Assessment & Plan Note (Addendum)
-   Resting SpO2 86% RA, 96% 2L after 1 lap - She re-qualified for O2 today

## 2020-06-04 NOTE — Addendum Note (Signed)
Addended by: Martyn Ehrich on: 06/04/2020 10:34 AM   Modules accepted: Orders

## 2020-06-10 DIAGNOSIS — C3412 Malignant neoplasm of upper lobe, left bronchus or lung: Secondary | ICD-10-CM | POA: Diagnosis not present

## 2020-06-10 DIAGNOSIS — J449 Chronic obstructive pulmonary disease, unspecified: Secondary | ICD-10-CM | POA: Diagnosis not present

## 2020-07-01 DIAGNOSIS — F322 Major depressive disorder, single episode, severe without psychotic features: Secondary | ICD-10-CM | POA: Diagnosis not present

## 2020-07-01 DIAGNOSIS — M810 Age-related osteoporosis without current pathological fracture: Secondary | ICD-10-CM | POA: Diagnosis not present

## 2020-07-01 DIAGNOSIS — K219 Gastro-esophageal reflux disease without esophagitis: Secondary | ICD-10-CM | POA: Diagnosis not present

## 2020-07-01 DIAGNOSIS — D510 Vitamin B12 deficiency anemia due to intrinsic factor deficiency: Secondary | ICD-10-CM | POA: Diagnosis not present

## 2020-07-01 DIAGNOSIS — E7800 Pure hypercholesterolemia, unspecified: Secondary | ICD-10-CM | POA: Diagnosis not present

## 2020-07-01 DIAGNOSIS — J441 Chronic obstructive pulmonary disease with (acute) exacerbation: Secondary | ICD-10-CM | POA: Diagnosis not present

## 2020-07-01 DIAGNOSIS — J449 Chronic obstructive pulmonary disease, unspecified: Secondary | ICD-10-CM | POA: Diagnosis not present

## 2020-07-01 DIAGNOSIS — I2510 Atherosclerotic heart disease of native coronary artery without angina pectoris: Secondary | ICD-10-CM | POA: Diagnosis not present

## 2020-07-08 DIAGNOSIS — C3412 Malignant neoplasm of upper lobe, left bronchus or lung: Secondary | ICD-10-CM | POA: Diagnosis not present

## 2020-07-08 DIAGNOSIS — J449 Chronic obstructive pulmonary disease, unspecified: Secondary | ICD-10-CM | POA: Diagnosis not present

## 2020-07-09 ENCOUNTER — Other Ambulatory Visit: Payer: Self-pay

## 2020-07-09 NOTE — Patient Outreach (Addendum)
[facility]) Care Management  07/09/2020  Adryanna Friedt Ohlrich 1950-08-25 [id number]   Telephone Assessment Annual Assessment   Successful outreach call to patient. Patient is pleased to re prot how well she has been feeling and doing. She has had mainly good days lately. She has even been able to get out of the ome and uses walker and walk outside. She continues to reside in he home alone. She has supportive son and family that checks on her and assists her as needed. She denies any recent falls. Appetite has improved and wgt stable. She has not seen PCP yet for annual wellness visit but has seen lung MD recently. She saw Cancer MD recently and was told she was cancer free per recent scans. Patient is so pleased and happy to ear this news. She voices that her family plans to have a small celebration for her. She denies any RN CM needs or concerns at this time.   Medications Reviewed Today    Reviewed by Hayden Pedro, RN (Registered Nurse) on 07/09/20 at [place] List Status: <None>  Medication Order Taking? Sig Documenting Provider Last Dose Status Informant  albuterol (PROVENTIL HFA;VENTOLIN HFA) 108 (90 Base) MCG/ACT inhaler [id number] No Inhale 2 puffs into the lungs every 6 (six) hours as needed for wheezing or shortness of breath. Rigoberto Noel, MD Taking Active Self  albuterol (PROVENTIL) (2.5 MG/3ML) 0.083% nebulizer solution [id number] No Take 3 mLs (2.5 mg total) by nebulization every 6 (six) hours as needed for wheezing or shortness of breath. Rigoberto Noel, MD Taking Active Self  alendronate (FOSAMAX) 70 MG tablet [id number] No Take 70 mg by mouth every Wednesday.  [provider] Taking Active Self           Med Note [name] Dec 24, 2017  2:52 PM)    atorvastatin (LIPITOR) 10 MG tablet [id number] No Take 10 mg by mouth daily. [provider] Taking Active Self  benzonatate (TESSALON) 200 MG capsule [id number] No Take 1  capsule (200 mg total) by mouth 3 (three) times daily as needed for cough. Lauraine Rinne, NP Taking Active   Cholecalciferol (VITAMIN D) 50 MCG (2000 UT) tablet [id number] No Take 2,000 Units by mouth daily. [provider] Taking Active Self  Cyanocobalamin (VITAMIN B-12) 5000 MCG TBDP [id number] No Take 1 tablet by mouth daily. [provider] Taking Active Self  Ferrous Sulfate (IRON PO) [id number] No Take 325 mg by mouth daily. [provider] Taking Active   Fluticasone-Umeclidin-Vilant (TRELEGY ELLIPTA) 200-62.5-25 MCG/INH AEPB [id number] No INHALE 1 PUFF INTO THE LUNGS EVERY DAY Rigoberto Noel, MD Taking Active   guaiFENesin (MUCINEX) 600 MG 12 hr tablet [id number] No Take 2 tablets (1,200 mg total) by mouth 2 (two) times daily.  Patient taking differently: Take 600 mg by mouth 2 (two) times daily as needed for cough.   Martyn Ehrich, NP Taking Active Self  montelukast (SINGULAIR) 10 MG tablet [id number] No Take 1 tablet (10 mg total) by mouth at bedtime. Arnell Asal, NP Taking Active Self  pantoprazole (PROTONIX) 40 MG tablet [id number] No Take 1 tablet (40 mg total) by mouth 2 (two) times daily.  Patient taking differently: Take 40 mg by mouth daily.   Arnell Asal, NP Taking Active Self  predniSONE (DELTASONE) 10 MG tablet [id number]  4 x 4 days, 3 x 4 days, 3 x 4 days, 2 x 4 days, 1 x 4  days then stop Martyn Ehrich, NP  Active   sertraline (ZOLOFT) 50 MG tablet [id number] No Take 1 tablet (50 mg total) by mouth daily. [name], MD Taking Active Self         Depression screen [facility] 2/9 07/09/2020 10/22/2019 03/06/2019 05/14/2018 03/01/2018  Decreased Interest 0 0 0 0 0  Down, Depressed, Hopeless 0 0 0 1 1  PHQ - 2 Score 0 0 0 1 1   Fall Risk  07/09/2020 03/15/2020 02/10/2020 12/30/2019 10/22/2019  Falls in the past year? 1 1 1 1 1   Number falls in past yr: 1 1 1 1 1   Injury with Fall? 0 0 0 0 0  Risk for fall due to : History of fall(s);Medication  side effect;Impaired balance/gait History of fall(s);Impaired balance/gait;Medication side effect History of fall(s);Impaired balance/gait;Impaired mobility;Medication side effect History of fall(s);Impaired balance/gait;Impaired mobility;Impaired vision;Medication side effect History of fall(s);Medication side effect;Impaired balance/gait;Impaired mobility;Impaired vision  Follow up Education provided;Falls evaluation completed Falls evaluation completed;Education provided Education provided;Falls evaluation completed Falls evaluation completed;Education provided;Falls prevention discussed Falls evaluation completed;Education provided;Falls prevention discussed   SDOH Screenings   Alcohol Screen: Not on file  Depression (PHQ2-9): Low Risk   . PHQ-2 Score: 0  Financial Resource Strain: Not on file  Food Insecurity: No Food Insecurity  . Worried About Charity fundraiser in the Last Year: Never true  . Ran Out of Food in the Last Year: Never true  Housing: Not on file  Physical Activity: Not on file  Social Connections: Not on file  Stress: Not on file  Tobacco Use: Medium Risk  . Smoking Tobacco Use: Former Smoker  . Smokeless Tobacco Use: Never Used  Transportation Needs: No Transportation Needs  . Lack of Transportation (Medical): No  . Lack of Transportation (Non-Medical): No     Goals Addressed              This Visit's Progress   .  (THN)Manage Fatigue (Tiredness) (pt-stated)        Timeframe:  Long-Range Goal Priority:  High Start Date:    02/10/20                         Expected End Date:                      Follow Up Date *Dec 2021   - eat healthy - get at least 7 to 8 hours of sleep at night - use devices that will help like a cane, sock-puller or reacher  Patient will verbalize actively participating with HHPT and report increase in mobility/strength   Why is this important?   Feeling tired or worn out is a common symptom of COPD (chronic obstructive pulmonary  disease).  Learning when you feel your best and when you need rest is important.  Managing the tiredness (fatigue) will help you be active and enjoy life.     Notes:  07/09/20-Patient pleased to report increased strength and endurance. She has been ambulating inside and outside of the home with use of walker.She is tolerating activity with minimal resp discomfort.     .  (THN)Track and Manage My Symptoms (pt-stated)        Timeframe:  Long-Range Goal Priority:  High Start Date: 07/09/2020                          Expected End Date:  June 2022                    Follow Up Date June 2022   -keep MD appts -adhere to preventive measures-including getting COVID booster shot    Why is this important?   Tracking your symptoms and other information about your health helps your doctor plan your care.  Write down the symptoms, the time of day, what you were doing and what medicine you are taking.  You will soon learn how to manage your symptoms.     Notes:  03/15/20-Patient continues to report ongoing weakness/fatigue. Resp sxs remain managed/controlled at present.   07/09/20-Patient states she needs to get COVID booster vaccine -will arrange appt soon She reports breathing has been controlled/managed. She is now CA free per latest scans and overall feeling well.     .  (THN)Track and Manage My Triggers (pt-stated)        Timeframe:  Long-Range Goal Priority:  High Start Date: 07/09/2020                             Expected End Date: June 2022                     Follow Up Date June 2022   -Patient will verbalize action plan and steps to take to track/manage sxs -Patient will be able to verbalize environmental triggers and ways to manage sxs   Why is this important?   Triggers are activities or things, like tobacco smoke or cold weather, that make your COPD (chronic obstructive pulmonary disease) flare-up.  Knowing these triggers helps you plan how to stay away from them.  When you cannot  remove them, you can learn how to manage them.     Notes:  03/15/20-Patient reports resp status managed/improved. Main concern at present is weakness/fatigue  07/09/20-Patient pleased to report that breathing is well controlled. She is not having to use oxygen or inhalers much. She saw lung MD recently and got a good report. Patient also pleased to chare that latest scans show that she is currently cancer free.        Plan: RN CM discussed with patient next outreach within the month of June. Patient gave verbal consent and in agreement with RN CM follow up and timeframe. Patient aware that they may contact RN CM sooner for any issues or concerns. RN CM reviewed goals and plan of care with patient.  Patient agrees to care plan and follow up.  RN CM will send quarterly update to PCP.   [name], RN,BSN,CCM [facility] Telephonic Care Management Coordinator Direct Phone: [phone] Toll Free: [phone] Fax: [phone]

## 2020-07-26 ENCOUNTER — Other Ambulatory Visit: Payer: Self-pay | Admitting: Primary Care

## 2020-08-02 DIAGNOSIS — F322 Major depressive disorder, single episode, severe without psychotic features: Secondary | ICD-10-CM | POA: Diagnosis not present

## 2020-08-02 DIAGNOSIS — I2510 Atherosclerotic heart disease of native coronary artery without angina pectoris: Secondary | ICD-10-CM | POA: Diagnosis not present

## 2020-08-02 DIAGNOSIS — M810 Age-related osteoporosis without current pathological fracture: Secondary | ICD-10-CM | POA: Diagnosis not present

## 2020-08-02 DIAGNOSIS — C3412 Malignant neoplasm of upper lobe, left bronchus or lung: Secondary | ICD-10-CM | POA: Diagnosis not present

## 2020-08-02 DIAGNOSIS — J441 Chronic obstructive pulmonary disease with (acute) exacerbation: Secondary | ICD-10-CM | POA: Diagnosis not present

## 2020-08-02 DIAGNOSIS — E7800 Pure hypercholesterolemia, unspecified: Secondary | ICD-10-CM | POA: Diagnosis not present

## 2020-08-02 DIAGNOSIS — D510 Vitamin B12 deficiency anemia due to intrinsic factor deficiency: Secondary | ICD-10-CM | POA: Diagnosis not present

## 2020-08-02 DIAGNOSIS — K219 Gastro-esophageal reflux disease without esophagitis: Secondary | ICD-10-CM | POA: Diagnosis not present

## 2020-08-02 DIAGNOSIS — J449 Chronic obstructive pulmonary disease, unspecified: Secondary | ICD-10-CM | POA: Diagnosis not present

## 2020-08-04 ENCOUNTER — Telehealth: Payer: Self-pay | Admitting: Primary Care

## 2020-08-04 NOTE — Telephone Encounter (Signed)
Called and spoke with patient who is wanting to know if her visit with BW on 08/09/20 can be a televisit. Patient stated she does not have transportation that can get her to the appointment and would like to have a televisit instead.   Derl Barrow please advise.

## 2020-08-04 NOTE — Telephone Encounter (Signed)
That is a-okay with me

## 2020-08-04 NOTE — Telephone Encounter (Signed)
Called and spoke with pt letting her know that Lori Butler said she was okay with pt's OV being changed to a televisit and pt verbalized understanding. I made the change in the appt notes. Nothing further needed.

## 2020-08-08 DIAGNOSIS — C3412 Malignant neoplasm of upper lobe, left bronchus or lung: Secondary | ICD-10-CM | POA: Diagnosis not present

## 2020-08-08 DIAGNOSIS — J449 Chronic obstructive pulmonary disease, unspecified: Secondary | ICD-10-CM | POA: Diagnosis not present

## 2020-08-09 ENCOUNTER — Ambulatory Visit (INDEPENDENT_AMBULATORY_CARE_PROVIDER_SITE_OTHER): Payer: Medicare HMO | Admitting: Acute Care

## 2020-08-09 ENCOUNTER — Other Ambulatory Visit: Payer: Self-pay

## 2020-08-09 ENCOUNTER — Ambulatory Visit: Payer: Medicare HMO | Admitting: Primary Care

## 2020-08-09 ENCOUNTER — Encounter: Payer: Self-pay | Admitting: Acute Care

## 2020-08-09 DIAGNOSIS — J9611 Chronic respiratory failure with hypoxia: Secondary | ICD-10-CM | POA: Diagnosis not present

## 2020-08-09 DIAGNOSIS — R911 Solitary pulmonary nodule: Secondary | ICD-10-CM

## 2020-08-09 DIAGNOSIS — J449 Chronic obstructive pulmonary disease, unspecified: Secondary | ICD-10-CM | POA: Diagnosis not present

## 2020-08-09 NOTE — Progress Notes (Signed)
Virtual Visit via Telephone Note  I connected with Lori Butler on 08/09/20 at 11:00 AM EDT by telephone and verified that I am speaking with the correct person using two identifiers.  Location: Patient: At home Provider: [place], [place], [place], [address]   I discussed the limitations, risks, security and privacy concerns of performing an evaluation and management service by telephone and the availability of in person appointments. I also discussed with the patient that there may be a patient responsible charge related to this service. The patient expressed understanding and agreed to proceed.   Synopsis 70 year old female, former smoker quit May 2019 (74 pack year hx). PMH significant for COPD, malignant neoplasm upper lobe left lung, hypoxia, anxiety/depression, attempted suicide, tobacco abuse. Patient of Dr. Elsworth Soho. Eosinophils absolute in August 2019 were 700. Sudden onset and variability seem to suggest component of asthma. Maintained on Trelegy. 10/18/2018 LDCT showed lung RADS 4x. PET scan June 2020 positive LUL hypermetabolic spiculated pulmonary nodules consistent with primary bronchogenic carcinoma. Referred to CT surgery for second opinion. Seen by Dr. Roxan Hockey on 10/29/18. 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 Patient not interested in surgery. Underwent navigation bronchoscopy for bx and fiducial placement July 2020.  Pathology and cytology were non-diagnostic with atypical cells no malignant cells. Case discussed at [facility], felt patient could continue observation versus stereotactic therapy. Patient did not wish to wait and wanted to proceed with radiation therapy despite no diagnosis of  definitive carcinoma. She began SBRT 12/03/18 and has had three treatments total. Last treatment 8/4. August 2020. She is cancer free at this interval. She has a follow up CT Chest scheduled 11/2020 through radiation oncology.   She had Covid 19 01/2020.  She was hospitalized x 5 days and she did receive Remdesivir infusion. She has had both vaccies, but needs booster, which she is planning on getting 08/2020   History of Present Illness: Pt. Presents for follow up of her COPD with asthma.She is wearing her oxygen at 2 L [place]. She is using her Trelegy daily without fail. She has not been using her rescue inhaler x 3 months. She feels like with Trelegy she can finally take a deep breath. She is compliant with her Singulair Mucinex and Protonix. She is using her flutter valve 3-4 a week. Last pred taper was Christmas 2021. Sats at rest are 98-99%. She does have shortness of breath with exertion which resolves with rest. She denies any wheezing, she has a productive cough in the morning  for thick white secretions . She can come off oxygen when she is at rest. CAT Score is 10/40 today. She states she has enough Trelegy.    Observations/Objective: 08/01/2019 CT Chest Radiation changes in the medial left upper lobe. Underlying 8 x 14 mm nodule, unchanged.  No findings suspicious for metastatic disease  10/2018 Low Dose CT Biapical pleuroparenchymal scarring. Spiculated nodule in the apical left upper lobe measures 12.6 mm (series 3, image 71). 3.6 mm left lower lobe nodule is nonspecific. No pleural fluid. Airway is unremarkable.  Lung-RADS 4X, highly suspicious. Consultation with pulmonary medicine or thoracic surgery is recommended, as well as staging evaluation with PET imaging, as clinically indicated. These results will be called to the ordering clinician or representative by the Radiologist Assistant, and communication documented in the PACS or zVision Dashboard. Aortic atherosclerosis (ICD10-170.0). Coronary artery calcification. Emphysema (ICD10-J43.9).  PET 10/2018 Left upper lobe hypermetabolic spiculated pulmonary nodule, consistent with primary  bronchogenic carcinoma. No hypermetabolic thoracic nodal or extrathoracic metastasis  identified. Presuming non-small-cell histology, T1bN0M0 or stage IA. Extensive muscular activity throughout the neck and chest, mildly limiting evaluation. Aortic atherosclerosis (ICD10-I70.0), coronary artery atherosclerosis and emphysema (ICD10-J43.9).   Assessment and Plan: COPD with asthma Stable interval Rare use of rescue Plan Continue Trelegy 1 puff once daily Rinse mouth after use Continue Flutter valve as needed for chest congestion Continue Singulair , Mucinex and Protonix as you have been doing. Follow up CT Chest 11/15/2020 as surveillance for lung cancer. Follow up in 3 months to review CT results  If you need [place] sooner call us sooner She has adequate Trelegy Note your daily symptoms > remember "red flags" for COPD: Increase in cough, increase in sputum production, increase in shortness of breath or activity intolerance. If you notice these symptoms, please call to be seen.    Chronic hypoxemia Plan Continue your oxygen at 2 L to maintain oxygen sats > 88%  Solitary pulmonary nodule on lung CT 10/2018 Treated with 3 SBRT events Cancer free 10/2019 Plan Follow up CT Chest as surveillance 11/2020  ( Scheduled per radiation oncology as surveillance)         Follow Up Instructions:     I discussed the assessment and treatment plan with the patient. The patient was provided an opportunity to ask questions and all were answered. The patient agreed with the plan and demonstrated an understanding of the instructions.   The patient was advised to call back or seek an in-person evaluation if the symptoms worsen or if the condition fails to improve as anticipated.  I provided 35 minutes of non-face-to-face time during this encounter.   Magdalen Spatz, NP 08/09/2020

## 2020-08-09 NOTE — Patient Instructions (Signed)
  It is good to talk with you today. Continue Trelegy 1 puff once daily Rinse mouth after use Continue Flutter valve as needed for chest congestion Continue Singulair , Mucinex and Protonix as you have been doing. Follow up CT Chest 11/15/2020 as surveillance for lung cancer. Continue your oxygen at 2 L to maintain oxygen sats > 88% Follow up in 3 months with Dr. Elsworth Soho, Eustaquio Maize NP or Judson Roch NP to review CT results  If you need [place] sooner call us sooner Note your daily symptoms > remember "red flags" for COPD: Increase in cough, increase in sputum production, increase in shortness of breath or activity intolerance. If you notice these symptoms, please call to be seen.

## 2020-08-23 DIAGNOSIS — E7800 Pure hypercholesterolemia, unspecified: Secondary | ICD-10-CM | POA: Diagnosis not present

## 2020-08-23 DIAGNOSIS — F322 Major depressive disorder, single episode, severe without psychotic features: Secondary | ICD-10-CM | POA: Diagnosis not present

## 2020-08-23 DIAGNOSIS — J441 Chronic obstructive pulmonary disease with (acute) exacerbation: Secondary | ICD-10-CM | POA: Diagnosis not present

## 2020-08-23 DIAGNOSIS — I2510 Atherosclerotic heart disease of native coronary artery without angina pectoris: Secondary | ICD-10-CM | POA: Diagnosis not present

## 2020-08-23 DIAGNOSIS — K219 Gastro-esophageal reflux disease without esophagitis: Secondary | ICD-10-CM | POA: Diagnosis not present

## 2020-08-23 DIAGNOSIS — J449 Chronic obstructive pulmonary disease, unspecified: Secondary | ICD-10-CM | POA: Diagnosis not present

## 2020-08-23 DIAGNOSIS — D510 Vitamin B12 deficiency anemia due to intrinsic factor deficiency: Secondary | ICD-10-CM | POA: Diagnosis not present

## 2020-08-23 DIAGNOSIS — M810 Age-related osteoporosis without current pathological fracture: Secondary | ICD-10-CM | POA: Diagnosis not present

## 2020-09-07 ENCOUNTER — Other Ambulatory Visit: Payer: Self-pay | Admitting: Pulmonary Disease

## 2020-09-07 DIAGNOSIS — J449 Chronic obstructive pulmonary disease, unspecified: Secondary | ICD-10-CM | POA: Diagnosis not present

## 2020-09-07 DIAGNOSIS — C3412 Malignant neoplasm of upper lobe, left bronchus or lung: Secondary | ICD-10-CM | POA: Diagnosis not present

## 2020-10-08 DIAGNOSIS — J441 Chronic obstructive pulmonary disease with (acute) exacerbation: Secondary | ICD-10-CM | POA: Diagnosis not present

## 2020-10-08 DIAGNOSIS — Z1211 Encounter for screening for malignant neoplasm of colon: Secondary | ICD-10-CM | POA: Diagnosis not present

## 2020-10-08 DIAGNOSIS — D510 Vitamin B12 deficiency anemia due to intrinsic factor deficiency: Secondary | ICD-10-CM | POA: Diagnosis not present

## 2020-10-08 DIAGNOSIS — J449 Chronic obstructive pulmonary disease, unspecified: Secondary | ICD-10-CM | POA: Diagnosis not present

## 2020-10-08 DIAGNOSIS — E7800 Pure hypercholesterolemia, unspecified: Secondary | ICD-10-CM | POA: Diagnosis not present

## 2020-10-08 DIAGNOSIS — F322 Major depressive disorder, single episode, severe without psychotic features: Secondary | ICD-10-CM | POA: Diagnosis not present

## 2020-10-08 DIAGNOSIS — I2510 Atherosclerotic heart disease of native coronary artery without angina pectoris: Secondary | ICD-10-CM | POA: Diagnosis not present

## 2020-10-08 DIAGNOSIS — C3412 Malignant neoplasm of upper lobe, left bronchus or lung: Secondary | ICD-10-CM | POA: Diagnosis not present

## 2020-10-08 DIAGNOSIS — R748 Abnormal levels of other serum enzymes: Secondary | ICD-10-CM | POA: Diagnosis not present

## 2020-10-08 DIAGNOSIS — M810 Age-related osteoporosis without current pathological fracture: Secondary | ICD-10-CM | POA: Diagnosis not present

## 2020-10-08 DIAGNOSIS — K219 Gastro-esophageal reflux disease without esophagitis: Secondary | ICD-10-CM | POA: Diagnosis not present

## 2020-10-08 DIAGNOSIS — R6889 Other general symptoms and signs: Secondary | ICD-10-CM | POA: Diagnosis not present

## 2020-10-08 DIAGNOSIS — I700 Atherosclerosis of aorta: Secondary | ICD-10-CM | POA: Diagnosis not present

## 2020-10-11 ENCOUNTER — Other Ambulatory Visit: Payer: Self-pay

## 2020-10-11 NOTE — Patient Outreach (Signed)
[facility]) Care Management  10/11/2020  Katye Valek Adcock [birth year] [id number]   Telephone Assessment  Successful outreach call placed t patient. She denies any acute issues and reports she has been doing well. She celebrated her birthday a few week ago with family and enjoyed her day. Se saw lung MD recently as well and states she got a good report. She denies any RN CM needs or concerns at this time.    Medications Reviewed Today    Reviewed by Hayden Pedro, RN (Registered Nurse) on 10/11/20 at [place] List Status: <None>  Medication Order Taking? Sig Documenting Provider Last Dose Status Informant  albuterol (PROVENTIL HFA;VENTOLIN HFA) 108 (90 Base) MCG/ACT inhaler [id number] No Inhale 2 puffs into the lungs every 6 (six) hours as needed for wheezing or shortness of breath. Rigoberto Noel, MD Taking Active Self  albuterol (PROVENTIL) (2.5 MG/3ML) 0.083% nebulizer solution [id number] No Take 3 mLs (2.5 mg total) by nebulization every 6 (six) hours as needed for wheezing or shortness of breath. Rigoberto Noel, MD Taking Active Self  alendronate (FOSAMAX) 70 MG tablet [id number] No Take 70 mg by mouth every Wednesday.  [provider] Taking Active Self           Med Note [name] Dec 24, 2017  2:52 PM)    atorvastatin (LIPITOR) 10 MG tablet [id number] No Take 10 mg by mouth daily. [provider] Taking Active Self  benzonatate (TESSALON) 200 MG capsule [id number] No Take 1 capsule (200 mg total) by mouth 3 (three) times daily as needed for cough. Lauraine Rinne, NP Taking Active   Cholecalciferol (VITAMIN D) 50 MCG (2000 UT) tablet [id number] No Take 2,000 Units by mouth daily. [provider] Taking Active Self  Cyanocobalamin (VITAMIN B-12) 5000 MCG TBDP [id number] No Take 1 tablet by mouth daily. [provider] Taking Active Self  Ferrous Sulfate (IRON PO) [id number] No Take 325 mg by mouth daily. [provider] Taking Active   guaiFENesin (MUCINEX) 600 MG 12 hr tablet [id number] No Take 2 tablets (1,200 mg total) by mouth 2 (two) times daily.  Patient taking differently: Take 600 mg by mouth 2 (two) times daily as needed for cough.   Martyn Ehrich, NP Taking Active   montelukast (SINGULAIR) 10 MG tablet [id number] No Take 1 tablet (10 mg total) by mouth at bedtime. Arnell Asal, NP Taking Active Self  pantoprazole (PROTONIX) 40 MG tablet [id number] No Take 1 tablet (40 mg total) by mouth 2 (two) times daily.  Patient taking differently: Take 40 mg by mouth daily.   Arnell Asal, NP Taking Active   sertraline (ZOLOFT) 50 MG tablet [id number] No Take 1 tablet (50 mg total) by mouth daily. [name], MD Taking Active Self  [name] 200-62.5-25 MCG/INH AEPB [id number] No INHALE 1 PUFF INTO THE LUNGS EVERY DAY Rigoberto Noel, MD Taking Active            Goals Addressed              This Visit's Progress   .  COMPLETED: (THN)Manage Fatigue (Tiredness) (pt-stated)        Timeframe:  Long-Range Goal Priority:  High Start Date:    02/10/20                         Expected End Date:11/04/2020  Follow Up Date *Dec 2021   - eat healthy - get at least 7 to 8 hours of sleep at night - use devices that will help like a cane, sock-puller or reacher  Patient will verbalize actively participating with HHPT and report increase in mobility/strength   Why is this important?   Feeling tired or worn out is a common symptom of COPD (chronic obstructive pulmonary disease).  Learning when you feel your best and when you need rest is important.  Managing the tiredness (fatigue) will help you be active and enjoy life.     Notes:  07/09/20-Patient pleased to report increased strength and endurance. She has been ambulating inside and outside of the home with use of walker.She is tolerating activity with minimal resp discomfort.     .  (THN)Track and Manage My  Symptoms (pt-stated)        Timeframe:  Long-Range Goal Priority:  High Start Date: 07/09/2020                          Expected End Date:  Sept 2022                    Follow Up Date Sept 2022    Barriers: Health Behaviors  -keep MD appts -adhere to preventive measures-including getting COVID booster shot    Why is this important?   Tracking your symptoms and other information about your health helps your doctor plan your care.  Write down the symptoms, the time of day, what you were doing and what medicine you are taking.  You will soon learn how to manage your symptoms.     Notes:  03/15/20-Patient continues to report ongoing weakness/fatigue. Resp sxs remain managed/controlled at present.   07/09/20-Patient states she needs to get COVID booster vaccine -will arrange appt soon She reports breathing has been controlled/managed. She is now CA free per latest scans and overall feeling well  10/11/20-Patient states she still plans to get COVID booster shot since cases are rising but has not made an appt yet. Patient strongly encouraged to do so given her high risk status. .     .  (THN)Track and Manage My Triggers (pt-stated)        Timeframe:  Long-Range Goal Priority:  High Start Date: 07/09/2020                             Expected End Date:Sept 2022                     Follow Up Date: Sept 2022    Barriers: Health Behaviors Knowledge  -Patient will verbalize action plan and steps to take to track/manage sxs -Patient will be able to verbalize environmental triggers and ways to manage sxs   Why is this important?   Triggers are activities or things, like tobacco smoke or cold weather, that make your COPD (chronic obstructive pulmonary disease) flare-up.  Knowing these triggers helps you plan how to stay away from them.  When you cannot remove them, you can learn how to manage them.     Notes:  03/15/20-Patient reports resp status managed/improved. Main concern at present is  weakness/fatigue  07/09/20-Patient pleased to report that breathing is well controlled. She is not having to use oxygen or inhalers much. She saw lung MD recently and got a good report. Patient also pleased to chare that  latest scans show that she is currently cancer free.   10/11/20 Patient reports breathing is managed as she tries not to over exert herself and takes freq rest periods. She is using oxygen and resp txs as ordered. She saw lung MD recently and reports she got a good report-no changes.       Plan: RN CM discussed with patient next outreach within the month of Sept. Patient gave verbal consent and in agreement with RN CM follow up and timeframe. Patient aware that they may contact RN CM sooner for any issues or concerns. RN CM reviewed goals and plan of care with patient. Patient agrees to care plan and follow up.  [name], RN,BSN,CCM [facility] Telephonic Care Management Coordinator Direct Phone: [phone] Toll Free: [phone] Fax: [phone]

## 2020-10-14 ENCOUNTER — Other Ambulatory Visit: Payer: Self-pay | Admitting: Primary Care

## 2020-10-15 DIAGNOSIS — Z1389 Encounter for screening for other disorder: Secondary | ICD-10-CM | POA: Diagnosis not present

## 2020-10-15 DIAGNOSIS — Z0000 Encounter for general adult medical examination without abnormal findings: Secondary | ICD-10-CM | POA: Diagnosis not present

## 2020-11-07 DIAGNOSIS — J449 Chronic obstructive pulmonary disease, unspecified: Secondary | ICD-10-CM | POA: Diagnosis not present

## 2020-11-07 DIAGNOSIS — C3412 Malignant neoplasm of upper lobe, left bronchus or lung: Secondary | ICD-10-CM | POA: Diagnosis not present

## 2020-11-12 ENCOUNTER — Other Ambulatory Visit: Payer: Self-pay

## 2020-11-12 ENCOUNTER — Inpatient Hospital Stay: Payer: Medicare HMO | Attending: Radiation Oncology

## 2020-11-12 ENCOUNTER — Ambulatory Visit (HOSPITAL_COMMUNITY)
Admission: RE | Admit: 2020-11-12 | Discharge: 2020-11-12 | Disposition: A | Discharge status: Home / Self Care | Payer: Medicare HMO | Source: Ambulatory Visit | Attending: Radiation Oncology | Admitting: Radiation Oncology

## 2020-11-12 DIAGNOSIS — I700 Atherosclerosis of aorta: Secondary | ICD-10-CM | POA: Diagnosis not present

## 2020-11-12 DIAGNOSIS — C3412 Malignant neoplasm of upper lobe, left bronchus or lung: Secondary | ICD-10-CM

## 2020-11-12 DIAGNOSIS — C3490 Malignant neoplasm of unspecified part of unspecified bronchus or lung: Secondary | ICD-10-CM | POA: Diagnosis not present

## 2020-11-12 DIAGNOSIS — J439 Emphysema, unspecified: Secondary | ICD-10-CM | POA: Diagnosis not present

## 2020-11-12 DIAGNOSIS — I313 Pericardial effusion (noninflammatory): Secondary | ICD-10-CM | POA: Diagnosis not present

## 2020-11-12 DIAGNOSIS — R6889 Other general symptoms and signs: Secondary | ICD-10-CM | POA: Diagnosis not present

## 2020-11-12 LAB — BUN & CREATININE (CHCC)
BUN: 12 mg/dL (ref 8–23)
Creatinine: 0.7 mg/dL (ref 0.44–1.00)
GFR, Estimated: >60 mL/min (ref >60)

## 2020-11-12 MED ORDER — SODIUM CHLORIDE (PF) 0.9 % IJ SOLN
INTRAMUSCULAR | Status: AC
  Filled 2020-11-12: qty 50

## 2020-11-12 MED ORDER — IOHEXOL 300 MG/ML  SOLN
75.0000 mL | Freq: Once | INTRAMUSCULAR | Status: AC | PRN
  Administered 2020-11-12: 75 mL via INTRAVENOUS

## 2020-11-15 ENCOUNTER — Ambulatory Visit
Admission: RE | Admit: 2020-11-15 | Discharge: 2020-11-15 | Disposition: A | Discharge status: Home / Self Care | Payer: Medicare HMO | Source: Ambulatory Visit | Attending: Radiation Oncology | Admitting: Radiation Oncology

## 2020-11-15 DIAGNOSIS — C3412 Malignant neoplasm of upper lobe, left bronchus or lung: Secondary | ICD-10-CM

## 2020-11-15 DIAGNOSIS — Z08 Encounter for follow-up examination after completed treatment for malignant neoplasm: Secondary | ICD-10-CM | POA: Diagnosis not present

## 2020-11-15 NOTE — Progress Notes (Signed)
Radiation Oncology         [phone] ________________________________  Outpatient Follow Up - Conducted via telephone due to current COVID-19 concerns for limiting patient exposure  I spoke with the patient to conduct this consult visit via telephone to spare the patient unnecessary potential exposure in the healthcare setting during the current COVID-19 pandemic. The patient was notified in advance and was offered a [place] meeting to allow for face to face communication but unfortunately reported that they did not have the appropriate resources/technology to support such a visit and instead preferred to proceed with a telephone visit.    Name: Lori Butler        MRN: [id number]  Date of Service: 11/15/2020 DOB: 12/21/1950  DT:OIZTIW, Lori Haw, MD  [name], MD     REFERRING PHYSICIAN: [name], MD   DIAGNOSIS: The encounter diagnosis was Malignant neoplasm of upper lobe of left lung ([place]).   HISTORY OF PRESENT ILLNESS: Lori Butler is a 70 y.o. female with a history of  Putative Stage IA2, cT1bN0M0 NSCLC of the LUL. She has oxygen dependant (2L ) COPD and Asthma and was sent for lung cancer screening scan on 10/14/2018 which revealed a 1.26 cm lesion in the left upper lobe, as well as a 3.6 mm lesion in the left lower lobe. No adenopathy was noted. She has a low attenuation lesion in the liver measuring 1.3 cm  And this was felt to be stable in comparison to prior studies. She underwent a PET scan on 10/18/2018 that revealed an SUV of 8.4 in the left upper lobe lesion that was seen on CT and measured 1.3 cm on PET. She did not have additional hypermetabolic changes in the other nodule or in thoracic nodes. The liver did not show metabolic change and was favored as a cyst. She did undergo bronchoscopy on 11/11/2018 which revealed atypical cells in her biopsy and cytology specimens without definitive carcinoma. Shedecided against surgical resection and proceeded with SBRT in the  summer of 2020. She has been followed in surveillance since and has been without recurrent disease.   A CT chest on 11/12/20 showed the nodule in the LUL measuring 9 mm in greatest dimension, stable from her prior scan, and evolving nodular change adjacent to the lesion at the base of the fiducial marker. No other areas of nodularity, or any adenopathy were seen. She continues to have stigmata of atherosclerosis of the aorta, emphysema, and a stable cystic structure in the left lobe of the liver. She's contacted today by phone to review her results.   PREVIOUS RADIATION THERAPY:    12/03/2018-12/10/2018 SBRT Treatment: The patient was treated to the left lung with a course of stereotactic body radiation treatment.  The patient received 54 Gray in 3 fractions using a IMRT technique, with 3 fields.   PAST MEDICAL HISTORY:  Past Medical History:  Diagnosis Date   Acute respiratory failure with hypoxia (HCC)    Anxiety    Aortic atherosclerosis ([place])    Arthritis    "a little in my hands probably" (10/31/2017)   Asthma    COPD (chronic obstructive pulmonary disease) (HCC)    COPD exacerbation (HCC)    Depression    History of kidney stones    lung ca dx'd 10/2018   Suicide attempt ([place]) 02/21/2018   overdosed on Wellbutrin       PAST SURGICAL HISTORY: Past Surgical History:  Procedure Laterality Date   ABDOMINAL HYSTERECTOMY  APPENDECTOMY     BREAST BIOPSY Left 1990s   CATARACT EXTRACTION W/ INTRAOCULAR LENS  IMPLANT, BILATERAL Bilateral    DILATION AND CURETTAGE OF UTERUS     FUDUCIAL PLACEMENT N/A 11/11/2018   Procedure: PLACEMENT OF FUDUCIAL;  Surgeon: Melrose Nakayama, MD;  Location: [facility];  Service: Thoracic;  Laterality: N/A;   TONSILLECTOMY     TUBAL LIGATION     VIDEO BRONCHOSCOPY WITH ENDOBRONCHIAL NAVIGATION N/A 11/11/2018   Procedure: VIDEO BRONCHOSCOPY WITH ENDOBRONCHIAL NAVIGATION;  Surgeon: Melrose Nakayama, MD;  Location: [facility];  Service: Thoracic;  Laterality:  N/A;     FAMILY HISTORY:  Family History  Problem Relation Age of Onset   Breast cancer Mother      SOCIAL HISTORY:  reports that she quit smoking about 3 years ago. Her smoking use included cigarettes. She has a 76.50 pack-year smoking history. She has never used smokeless tobacco. She reports previous alcohol use. She reports that she does not use drugs. The patient is divorced and lives in [place]. Her son Lori Butler joined [place] on speaker phone.    ALLERGIES: Demerol [meperidine], Oxycodone-acetaminophen, and Tramadol   MEDICATIONS:  Current Outpatient Medications  Medication Sig Dispense Refill   albuterol (PROVENTIL HFA;VENTOLIN HFA) 108 (90 Base) MCG/ACT inhaler Inhale 2 puffs into the lungs every 6 (six) hours as needed for wheezing or shortness of breath. 1 Inhaler 2   albuterol (PROVENTIL) (2.5 MG/3ML) 0.083% nebulizer solution Take 3 mLs (2.5 mg total) by nebulization every 6 (six) hours as needed for wheezing or shortness of breath. 75 mL 6   alendronate (FOSAMAX) 70 MG tablet Take 70 mg by mouth every Wednesday.      atorvastatin (LIPITOR) 10 MG tablet Take 10 mg by mouth daily.     benzonatate (TESSALON) 200 MG capsule Take 1 capsule (200 mg total) by mouth 3 (three) times daily as needed for cough. 30 capsule 3   Cholecalciferol (VITAMIN D) 50 MCG (2000 UT) tablet Take 2,000 Units by mouth daily.     Cyanocobalamin (VITAMIN B-12) 5000 MCG TBDP Take 1 tablet by mouth daily.     Ferrous Sulfate (IRON PO) Take 325 mg by mouth daily.     guaiFENesin (MUCINEX) 600 MG 12 hr tablet Take 2 tablets (1,200 mg total) by mouth 2 (two) times daily. (Patient taking differently: Take 600 mg by mouth 2 (two) times daily as needed for cough.) 30 tablet 0   montelukast (SINGULAIR) 10 MG tablet Take 1 tablet (10 mg total) by mouth at bedtime. 30 tablet 1   pantoprazole (PROTONIX) 40 MG tablet Take 1 tablet (40 mg total) by mouth 2 (two) times daily. (Patient taking differently: Take 40 mg by  mouth daily.) 60 tablet 1   sertraline (ZOLOFT) 50 MG tablet Take 1 tablet (50 mg total) by mouth daily. 30 tablet 0   TRELEGY ELLIPTA 200-62.5-25 MCG/INH AEPB INHALE 1 PUFF INTO THE LUNGS EVERY DAY 180 each 1   No current facility-administered medications for this encounter.     REVIEW OF SYSTEMS: On review of systems, the patient reports that she is doing well overall. She continues to use O2 at 2L and this has not changed. She denies any recent hospitalizations or COPD exacerbations. She does have chronic trouble with breathing when she's outside in the heat and humidity.  She denies any chest pain, shortness of breath, cough. No other complaints are verbalized.      PHYSICAL EXAM:  Unable to assess due to encounter  type.    ECOG = 1  0 - Asymptomatic (Fully active, able to carry on all predisease activities without restriction)  1 - Symptomatic but completely ambulatory (Restricted in physically strenuous activity but ambulatory and able to carry out work of a light or sedentary nature. For example, light housework, office work)  2 - Symptomatic, <50% in bed during the day (Ambulatory and capable of all self care but unable to carry out any work activities. Up and about more than 50% of waking hours)  3 - Symptomatic, >50% in bed, but not bedbound (Capable of only limited self-care, confined to bed or chair 50% or more of waking hours)  4 - Bedbound (Completely disabled. Cannot carry on any self-care. Totally confined to bed or chair)  5 - Death   Eustace Pen MM, Creech RH, Tormey DC, et al. [phone]). "Toxicity and response criteria of the [facility]". [place] Oncol. 5 (6): 649-55    LABORATORY DATA:  Lab Results  Component Value Date   WBC 5.4 02/08/2020   HGB 11.4 (L) 02/08/2020   HCT 35.8 (L) 02/08/2020   MCV 88.2 02/08/2020   PLT 233 02/08/2020   Lab Results  Component Value Date   NA 140 02/08/2020   K 3.7 02/08/2020   CL 108 02/08/2020    CO2 22 02/08/2020   Lab Results  Component Value Date   ALT 22 02/08/2020   AST 26 02/08/2020   ALKPHOS 255 (H) 02/08/2020   BILITOT 0.7 02/08/2020      RADIOGRAPHY: CT CHEST W CONTRAST  Result Date: 11/13/2020 CLINICAL DATA:  Non-small-cell lung cancer.  Restaging. EXAM: CT CHEST WITH CONTRAST TECHNIQUE: Multidetector CT imaging of the chest was performed during intravenous contrast administration. CONTRAST:  73mL OMNIPAQUE IOHEXOL 300 MG/ML  SOLN COMPARISON:  05/06/2020 FINDINGS: Cardiovascular: Heart size within normal limits. Trace pericardial effusion is similar to prior. Coronary artery calcification is evident. Mild atherosclerotic calcification is noted in the wall of the thoracic aorta. Mediastinum/Nodes: No mediastinal lymphadenopathy. There is no hilar lymphadenopathy. Tiny hiatal hernia. The esophagus has normal imaging features. There is no axillary lymphadenopathy. Lungs/Pleura: Post treatment scarring in the left apex is similar to prior. Nodular component measured previously at 9 x 7 mm is stable at 9 x 7 mm today on image 31/series 5. A new 7 mm nodular focus of soft tissue attenuation is seen immediately anterior to the inferior most fiducial marker (image 40/5) which may reflect evolving scar. Stable pleuroparenchymal scarring in the right lung apex. Centrilobular and paraseptal emphysema evident. No focal airspace consolidation. There is no evidence of pleural effusion. Upper Abdomen: Small hypodensity in the left liver is stable, compatible with benign etiology such as cyst. Musculoskeletal: No worrisome lytic or sclerotic osseous abnormality. IMPRESSION: 1. Post treatment changes again noted left upper lobe. Index left upper lobe pulmonary nodule is stable with new adjacent 7 mm nodular focus of soft tissue attenuation likely related to evolution of scarring, but close attention on follow-up recommended. 2. Aortic Atherosclerosis (ICD10-I70.0) and Emphysema (ICD10-J43.9).  Electronically Signed   By: Misty Stanley M.D.   On: 11/13/2020 06:48        IMPRESSION/PLAN: 1. Putative Stage IA2, cT1bN0M0 NSCLC of the LUL. The patient appears to be radiographically without disease and we discussed the expected evolving scaring in the the lung adjacent to the treatment site. We will follow this closely and I recommended we proceed with repeat imaging in 6 months time per NCCN guidelines. She  is in agreement and will notify me sooner if she has questions or concerns.   2. LLL nodule. This is no longer seen on imaging but we will follow up on subsequent visits to make sure no new or suspicious nodules need to be followed or treated. 3. COPD/Asthma. The patient was encouraged to continue to follow up with pulmonary medicine, as she continues with her oxygen at 2L [place]. We appreciate their help in maximizing her lung function.    Given current concerns for patient exposure during the COVID-19 pandemic, this encounter was conducted via telephone.  The patient has provided two factor identification and has given verbal consent for this type of encounter and has been advised to only accept a meeting of this type in a secure network environment. The time spent during this encounter was 20 minutes including preparation, discussion, and coordination of the patient's care. The attendants for this meeting include Hayden Pedro  and Eugenio Hoes [place].  During the encounter, Hayden Pedro was located at [facility] Radiation Oncology Department.  Shirlena Brinegar Hogue was located at home.     Carola Rhine, PAC

## 2020-12-08 DIAGNOSIS — C3412 Malignant neoplasm of upper lobe, left bronchus or lung: Secondary | ICD-10-CM | POA: Diagnosis not present

## 2020-12-08 DIAGNOSIS — J449 Chronic obstructive pulmonary disease, unspecified: Secondary | ICD-10-CM | POA: Diagnosis not present

## 2020-12-24 ENCOUNTER — Other Ambulatory Visit: Payer: Self-pay

## 2020-12-24 DIAGNOSIS — I2510 Atherosclerotic heart disease of native coronary artery without angina pectoris: Secondary | ICD-10-CM | POA: Diagnosis not present

## 2020-12-24 DIAGNOSIS — M810 Age-related osteoporosis without current pathological fracture: Secondary | ICD-10-CM | POA: Diagnosis not present

## 2020-12-24 DIAGNOSIS — E7800 Pure hypercholesterolemia, unspecified: Secondary | ICD-10-CM | POA: Diagnosis not present

## 2020-12-24 DIAGNOSIS — D510 Vitamin B12 deficiency anemia due to intrinsic factor deficiency: Secondary | ICD-10-CM | POA: Diagnosis not present

## 2020-12-24 DIAGNOSIS — F322 Major depressive disorder, single episode, severe without psychotic features: Secondary | ICD-10-CM | POA: Diagnosis not present

## 2020-12-24 DIAGNOSIS — J441 Chronic obstructive pulmonary disease with (acute) exacerbation: Secondary | ICD-10-CM | POA: Diagnosis not present

## 2020-12-24 DIAGNOSIS — J449 Chronic obstructive pulmonary disease, unspecified: Secondary | ICD-10-CM | POA: Diagnosis not present

## 2020-12-24 DIAGNOSIS — K219 Gastro-esophageal reflux disease without esophagitis: Secondary | ICD-10-CM | POA: Diagnosis not present

## 2020-12-24 MED ORDER — ALBUTEROL SULFATE HFA 108 (90 BASE) MCG/ACT IN AERS: 2.0000 | INHALATION_SPRAY | Freq: Four times a day (QID) | RESPIRATORY_TRACT | 2 refills | Status: AC | PRN

## 2021-01-08 DIAGNOSIS — J449 Chronic obstructive pulmonary disease, unspecified: Secondary | ICD-10-CM | POA: Diagnosis not present

## 2021-01-08 DIAGNOSIS — C3412 Malignant neoplasm of upper lobe, left bronchus or lung: Secondary | ICD-10-CM | POA: Diagnosis not present

## 2021-01-11 ENCOUNTER — Other Ambulatory Visit: Payer: Self-pay

## 2021-01-11 NOTE — Patient Outreach (Signed)
[facility]) Care Management  01/11/2021  Lori Butler Aug 27, 1950 [id number]   Telephone Assessment   Outreach call to patient. Spoke with patient who was pleased to report that she ws currently in [place] visiting with her brothers whom she has not seen in 61 yrs. She voices that her niece came and got her yesterday and she was able to drive her up to visit family. Patient states she tolerated the trip well. She is unsure how long she will be visiting. She denies any acute issues or concerns at present. States things are going well. She will follow up with RN CM once she returns home from trip.     Plan: RN CM discussed with patient next outreach within the month of Nov. Patient agrees to care plan and follow up. Patient gave verbal consent and in agreement with RN CM follow up and timeframe. Patient aware that they may contact RN CM sooner for any issues or concerns. RN CM reviewed goals and plan of care with patient.  [name], RN,BSN,CCM [facility] Telephonic Care Management Coordinator Direct Phone: [phone] Toll Free: [phone] Fax: [phone]

## 2021-02-07 DIAGNOSIS — C3412 Malignant neoplasm of upper lobe, left bronchus or lung: Secondary | ICD-10-CM | POA: Diagnosis not present

## 2021-02-07 DIAGNOSIS — J449 Chronic obstructive pulmonary disease, unspecified: Secondary | ICD-10-CM | POA: Diagnosis not present

## 2021-02-24 DIAGNOSIS — R748 Abnormal levels of other serum enzymes: Secondary | ICD-10-CM | POA: Diagnosis not present

## 2021-02-24 DIAGNOSIS — R6889 Other general symptoms and signs: Secondary | ICD-10-CM | POA: Diagnosis not present

## 2021-03-09 DIAGNOSIS — F322 Major depressive disorder, single episode, severe without psychotic features: Secondary | ICD-10-CM | POA: Diagnosis not present

## 2021-03-09 DIAGNOSIS — J441 Chronic obstructive pulmonary disease with (acute) exacerbation: Secondary | ICD-10-CM | POA: Diagnosis not present

## 2021-03-09 DIAGNOSIS — D510 Vitamin B12 deficiency anemia due to intrinsic factor deficiency: Secondary | ICD-10-CM | POA: Diagnosis not present

## 2021-03-09 DIAGNOSIS — I2510 Atherosclerotic heart disease of native coronary artery without angina pectoris: Secondary | ICD-10-CM | POA: Diagnosis not present

## 2021-03-09 DIAGNOSIS — J449 Chronic obstructive pulmonary disease, unspecified: Secondary | ICD-10-CM | POA: Diagnosis not present

## 2021-03-09 DIAGNOSIS — E7800 Pure hypercholesterolemia, unspecified: Secondary | ICD-10-CM | POA: Diagnosis not present

## 2021-03-09 DIAGNOSIS — M810 Age-related osteoporosis without current pathological fracture: Secondary | ICD-10-CM | POA: Diagnosis not present

## 2021-03-09 DIAGNOSIS — K219 Gastro-esophageal reflux disease without esophagitis: Secondary | ICD-10-CM | POA: Diagnosis not present

## 2021-03-10 DIAGNOSIS — J449 Chronic obstructive pulmonary disease, unspecified: Secondary | ICD-10-CM | POA: Diagnosis not present

## 2021-03-10 DIAGNOSIS — C3412 Malignant neoplasm of upper lobe, left bronchus or lung: Secondary | ICD-10-CM | POA: Diagnosis not present

## 2021-03-23 ENCOUNTER — Other Ambulatory Visit: Payer: Self-pay

## 2021-03-23 NOTE — Patient Outreach (Signed)
[facility]) Care Management  03/23/2021  Lori Butler 01/20/1951 [id number]   Telephone Assessment   Successful outreach call placed to patient. She voices that she is doing well and has been feeling good the past couple of months. She is looking forward to upcoming holidays and spending time with family. She denies any RN CM needs or concerns at this time.    Medications Reviewed Today     Reviewed by Hayden Pedro, RN (Registered Nurse) on 03/23/21 at [facility] List Status: <None>   Medication Order Taking? Sig Documenting Provider Last Dose Status Informant  albuterol (PROVENTIL) (2.5 MG/3ML) 0.083% nebulizer solution [id number]  Take 3 mLs (2.5 mg total) by nebulization every 6 (six) hours as needed for wheezing or shortness of breath. Rigoberto Noel, MD  Active Self  albuterol (VENTOLIN HFA) 108 (90 Base) MCG/ACT inhaler [id number]  Inhale 2 puffs into the lungs every 6 (six) hours as needed for wheezing or shortness of breath. Magdalen Spatz, NP  Active   alendronate (FOSAMAX) 70 MG tablet [id number]  Take 70 mg by mouth every Wednesday.  [provider]  Active Self           Med Note [name] Dec 24, 2017  2:52 PM)    atorvastatin (LIPITOR) 10 MG tablet [id number]  Take 10 mg by mouth daily. [provider]  Active Self  benzonatate (TESSALON) 200 MG capsule [id number]  Take 1 capsule (200 mg total) by mouth 3 (three) times daily as needed for cough. Lauraine Rinne, NP  Active   Cholecalciferol (VITAMIN D) 50 MCG (2000 UT) tablet [id number]  Take 2,000 Units by mouth daily. [provider]  Active Self  Cyanocobalamin (VITAMIN B-12) 5000 MCG TBDP [id number]  Take 1 tablet by mouth daily. [provider]  Active Self  Ferrous Sulfate (IRON PO) [id number]  Take 325 mg by mouth daily. [provider]  Active   guaiFENesin (MUCINEX) 600 MG 12 hr tablet [id number]  Take 2 tablets (1,200 mg total) by  mouth 2 (two) times daily.  Patient taking differently: Take 600 mg by mouth 2 (two) times daily as needed for cough.   Martyn Ehrich, NP  Active   montelukast (SINGULAIR) 10 MG tablet [id number]  Take 1 tablet (10 mg total) by mouth at bedtime. Arnell Asal, NP  Active Self  pantoprazole (PROTONIX) 40 MG tablet [id number]  Take 1 tablet (40 mg total) by mouth 2 (two) times daily.  Patient taking differently: Take 40 mg by mouth daily.   Arnell Asal, NP  Active   sertraline (ZOLOFT) 50 MG tablet [id number]  Take 1 tablet (50 mg total) by mouth daily. [name], MD  Active Self  [name] 200-62.5-25 MCG/INH AEPB [id number]  INHALE 1 PUFF INTO THE LUNGS EVERY DAY Rigoberto Noel, MD  Active   ursodiol (ACTIGALL) 250 MG tablet [id number] Yes Take 250 mg by mouth 2 (two) times daily. [provider]  Active Self             Care Plan : COPD (Adult)  Updates made by Hayden Pedro, RN since 03/23/2021 12:00 AM     Problem: Symptom Exacerbation (COPD)      Long-Range Goal: Track and Manage My Symptoms-Patient will have no exacerbations/hospitlaization within the next 90 days Completed 03/23/2021  Start Date: 02/10/2020  Expected End Date: 02/04/2021  Recent Progress: On track  Priority: High  Note:      - arrange in-home help services - develop a rescue plan - follow rescue plan if symptoms flare-up - keep follow-up appointments    Why is this important?   Tracking your symptoms and other information about your health helps your doctor plan your care.  Write down the symptoms, the time of day, what you were doing and what medicine you are taking.  You will soon learn how to manage your symptoms.     Notes:  07/09/20 RN CM instructed on s/s of worsening condition and when to seek medical attention. RN CM ensured action plan in place and pt/caregiver knows when to seek medical attention. RN CM instructed in the importance of med adherence to  prevent symptom exacerbation. RN CM confirmed pt has MD follow up appt. in place and no transportation barriers.  RN CM discussed environmental triggers and ways to manage/treat. RNCM instructed o importance of annual immunizations/vaccines.  10/11/20 RN CM reviewed COPD zones and action plan. RN CM instructed on s/s of worsening condition and when to seek medical attention. RN CM ensured action plan in place and pt/caregiver knows when to seek medical attention. RN CM instructed in the importance of med adherence to prevent symptom exacerbation. RN CM confirmed pt has MD follow up appt. in place and no transportation barriers.  RN CM discussed environmental triggers and ways to manage/treat. RNCM instructed of importance of annual immunizations/vaccines.  53/29/92-[id number] due to duplicate care plan and goals    Care Plan : RN Care Manager POC  Updates made by Hayden Pedro, RN since 03/23/2021 12:00 AM     Problem: Chronic Disease Mgmt of Chronic Conditions-COPD   Priority: High     Long-Range Goal: Development of POC for Mgmt of Chronic Conditions   Start Date: 03/23/2021  Expected End Date: 03/23/2022  Priority: High  Note:     Current Barriers:  Chronic Disease Management support and education needs related to COPD  RNCM Clinical Goal(s):  Patient will take all medications exactly as prescribed and will call provider for medication related questions attend all scheduled medical appointments: including PCP and specialists continue to work with RN Care Manager to address care management and care coordination needs related to  COPD will demonstrate ongoing self health care management ability for mgmt of chronic conditiond  through collaboration with RN Care manager, provider, and care team.   Interventions: POC sent to PCP upon initial assessment, quarterly and with any changes in patient's conditions Inter-disciplinary care team collaboration (see  longitudinal plan of care) Evaluation of current treatment plan related to  self management and patient's adherence to plan as established by provider   COPD Interventions: New Goal -Patient pleased to report how well her COPD sxs have been lately. She has not had to use prn meds  Advised patient to track and manage COPD triggers;  Advised patient to self assesses COPD action plan zone and make appointment with provider if in the yellow zone for 48 hours without improvement; Assessed for immunization status-pt completed getting COVID booster, flu and PNA vaccine last month  Patient Goals/Self-Care Activities: Patient will self administer medications as prescribed Patient will attend all scheduled provider appointments Patient will continue to perform ADL's independently Patient will have no exacerbation of sxs   Follow Up Plan:  Telephone follow up appointment with care management team member scheduled for:  quarterly-within the month of Feb The patient has been provided with contact information for the care management team  and has been advised to call with any health related questions or concerns.       Plan: RN CM discussed with patient next outreach within the month of Feb. Patient agrees to care plan and follow up. RN CM will send quarterly update to PCP.  [name], RN,BSN,CCM [facility] Telephonic Care Management Coordinator Direct Phone: [phone] Toll Free: [phone] Fax: [phone]

## 2021-04-16 DIAGNOSIS — Z1211 Encounter for screening for malignant neoplasm of colon: Secondary | ICD-10-CM | POA: Diagnosis not present

## 2021-05-13 ENCOUNTER — Telehealth: Payer: Self-pay

## 2021-05-13 NOTE — Telephone Encounter (Signed)
Per Shona Simpson PA-C, I spoke w/ patient & verified her identity. I notified her of a letter she is to receive in the mail regarding her next CT scan of the lungs. This is to help gather additional information about her early stage lung cancer. Patient states "She did receive the letter". I told patient to call if she is to have any questions or concerns. Patient verbalized understanding of the information.

## 2021-05-19 ENCOUNTER — Other Ambulatory Visit: Payer: Self-pay

## 2021-05-19 ENCOUNTER — Ambulatory Visit (HOSPITAL_COMMUNITY)
Admission: RE | Admit: 2021-05-19 | Discharge: 2021-05-19 | Disposition: A | Discharge status: Home / Self Care | Payer: Medicare HMO | Source: Ambulatory Visit | Attending: Radiation Oncology | Admitting: Radiation Oncology

## 2021-05-19 ENCOUNTER — Encounter: Payer: Self-pay | Admitting: Radiation Oncology

## 2021-05-19 ENCOUNTER — Ambulatory Visit
Admission: RE | Admit: 2021-05-19 | Discharge: 2021-05-19 | Disposition: A | Discharge status: Home / Self Care | Payer: Medicare HMO | Source: Ambulatory Visit | Attending: Radiation Oncology | Admitting: Radiation Oncology

## 2021-05-19 DIAGNOSIS — C3490 Malignant neoplasm of unspecified part of unspecified bronchus or lung: Secondary | ICD-10-CM | POA: Diagnosis not present

## 2021-05-19 DIAGNOSIS — I700 Atherosclerosis of aorta: Secondary | ICD-10-CM | POA: Diagnosis not present

## 2021-05-19 DIAGNOSIS — J439 Emphysema, unspecified: Secondary | ICD-10-CM | POA: Diagnosis not present

## 2021-05-19 DIAGNOSIS — Z08 Encounter for follow-up examination after completed treatment for malignant neoplasm: Secondary | ICD-10-CM | POA: Diagnosis not present

## 2021-05-19 DIAGNOSIS — C3412 Malignant neoplasm of upper lobe, left bronchus or lung: Secondary | ICD-10-CM | POA: Diagnosis not present

## 2021-05-19 DIAGNOSIS — R0602 Shortness of breath: Secondary | ICD-10-CM | POA: Diagnosis not present

## 2021-05-19 DIAGNOSIS — R6889 Other general symptoms and signs: Secondary | ICD-10-CM | POA: Diagnosis not present

## 2021-05-19 LAB — POCT I-STAT CREATININE: Creatinine, Ser: 0.7 mg/dL (ref 0.44–1.00)

## 2021-05-19 MED ORDER — SODIUM CHLORIDE (PF) 0.9 % IJ SOLN
INTRAMUSCULAR | Status: AC
  Filled 2021-05-19: qty 50

## 2021-05-19 MED ORDER — IOHEXOL 300 MG/ML  SOLN
75.0000 mL | Freq: Once | INTRAMUSCULAR | Status: AC | PRN
  Administered 2021-05-19: 75 mL via INTRAVENOUS

## 2021-05-19 NOTE — Progress Notes (Signed)
Verified patient identity and begin nursing interview. Patient reports no pain but is having some shortness of breath, that is aided by the use of an oxygen tank. Otherwise patient is doing well.   Meaningful use complete. Postmenopausal- NO chances of pregnancy  Patient notified of 1:00pm-05/23/21 telephone appointment w/ Shona Simpson PA-C. I left my extension [phone] in case patient needs to call. Patient verbalized understanding of all information given.  Patient preferred contact # [phone]

## 2021-05-23 ENCOUNTER — Ambulatory Visit
Admission: RE | Admit: 2021-05-23 | Discharge: 2021-05-23 | Disposition: A | Discharge status: Home / Self Care | Payer: Medicare HMO | Source: Ambulatory Visit | Attending: Radiation Oncology | Admitting: Radiation Oncology

## 2021-05-23 NOTE — Progress Notes (Signed)
Radiation Oncology         ([phone] ________________________________  Outpatient Follow Up - Conducted via telephone due to current COVID-19 concerns for limiting patient exposure  I spoke with the patient to conduct this consult visit via telephone to spare the patient unnecessary potential exposure in the healthcare setting during the current COVID-19 pandemic. The patient was notified in advance and was offered a [place] meeting to allow for face to face communication but unfortunately reported that they did not have the appropriate resources/technology to support such a visit and instead preferred to proceed with a telephone visit.    Name: Lori Butler        MRN: [id number]  Date of Service: 05/19/2021 DOB: [birth year]  [id number], Lori Haw, MD  Lori Lass, MD     REFERRING PHYSICIAN: [name], MD   DIAGNOSIS: The encounter diagnosis was Malignant neoplasm of upper lobe of left lung [facility]).   HISTORY OF PRESENT ILLNESS: Lori Butler is a 71 y.o. female with a history of  Putative Stage IA2, cT1bN0M0 NSCLC of the LUL. She has oxygen dependant (2L [place]) COPD and Asthma and was sent for lung cancer screening scan on 10/14/2018 which revealed a 1.26 cm lesion in the left upper lobe, as well as a 3.6 mm lesion in the left lower lobe. No adenopathy was noted. She has a low attenuation lesion in the liver measuring 1.3 cm  And this was felt to be stable in comparison to prior studies. She underwent a PET scan on 10/18/2018 that revealed an SUV of 8.4 in the left upper lobe lesion that was seen on CT and measured 1.3 cm on PET. She did not have additional hypermetabolic changes in the other nodule or in thoracic nodes. The liver did not show metabolic change and was favored as a cyst. She did undergo bronchoscopy on 11/11/2018 which revealed atypical cells in her biopsy and cytology specimens without definitive carcinoma. Shedecided against surgical resection and proceeded with SBRT in the  summer of 2020. She has been followed in surveillance since and has been without recurrent disease.   A CT chest on 11/12/20 showed the nodule in the LUL measuring 9 mm in greatest dimension, stable from her prior scan, and evolving nodular change adjacent to the lesion at the base of the fiducial marker. No other areas of nodularity, or any adenopathy were seen. She continues to have stigmata of atherosclerosis of the aorta, emphysema, and a stable cystic structure in the left lobe of the liver. She's contacted today by phone to review her results.   PREVIOUS RADIATION THERAPY:    12/03/2018-12/10/2018 SBRT Treatment: The patient was treated to the left lung with a course of stereotactic body radiation treatment.  The patient received 54 Gray in 3 fractions using a IMRT technique, with 3 fields.   PAST MEDICAL HISTORY:  Past Medical History:  Diagnosis Date   Acute respiratory failure with hypoxia (HCC)    Anxiety    Aortic atherosclerosis ([place])    Arthritis    "a little in my hands probably" (10/31/2017)   Asthma    COPD (chronic obstructive pulmonary disease) (HCC)    COPD exacerbation (HCC)    Depression    History of kidney stones    lung ca dx'd 10/2018   Suicide attempt ([place]) 02/21/2018   overdosed on Wellbutrin       PAST SURGICAL HISTORY: Past Surgical History:  Procedure Laterality Date   ABDOMINAL HYSTERECTOMY  APPENDECTOMY     BREAST BIOPSY Left 1990s   CATARACT EXTRACTION W/ INTRAOCULAR LENS  IMPLANT, BILATERAL Bilateral    DILATION AND CURETTAGE OF UTERUS     FUDUCIAL PLACEMENT N/A 11/11/2018   Procedure: PLACEMENT OF FUDUCIAL;  Surgeon: Melrose Nakayama, MD;  Location: [place];  Service: Thoracic;  Laterality: N/A;   TONSILLECTOMY     TUBAL LIGATION     VIDEO BRONCHOSCOPY WITH ENDOBRONCHIAL NAVIGATION N/A 11/11/2018   Procedure: VIDEO BRONCHOSCOPY WITH ENDOBRONCHIAL NAVIGATION;  Surgeon: Melrose Nakayama, MD;  Location: [place];  Service: Thoracic;  Laterality:  N/A;     FAMILY HISTORY:  Family History  Problem Relation Age of Onset   Breast cancer Mother      SOCIAL HISTORY:  reports that she quit smoking about 3 years ago. Her smoking use included cigarettes. She has a 76.50 pack-year smoking history. She has never used smokeless tobacco. She reports that she does not currently use alcohol. She reports that she does not use drugs. The patient is divorced and lives in [place]. Her son Lori Butler joined [place] on speaker phone.    ALLERGIES: Demerol [meperidine], Oxycodone-acetaminophen, and Tramadol   MEDICATIONS:  Current Outpatient Medications  Medication Sig Dispense Refill   albuterol (PROVENTIL) (2.5 MG/3ML) 0.083% nebulizer solution Take 3 mLs (2.5 mg total) by nebulization every 6 (six) hours as needed for wheezing or shortness of breath. 75 mL 6   albuterol (VENTOLIN HFA) 108 (90 Base) MCG/ACT inhaler Inhale 2 puffs into the lungs every 6 (six) hours as needed for wheezing or shortness of breath. 1 each 2   alendronate (FOSAMAX) 70 MG tablet Take 70 mg by mouth every Wednesday.      atorvastatin (LIPITOR) 10 MG tablet Take 10 mg by mouth daily.     benzonatate (TESSALON) 200 MG capsule Take 1 capsule (200 mg total) by mouth 3 (three) times daily as needed for cough. 30 capsule 3   Cholecalciferol (VITAMIN D) 50 MCG (2000 UT) tablet Take 2,000 Units by mouth daily.     Cyanocobalamin (VITAMIN B-12) 5000 MCG TBDP Take 1 tablet by mouth daily.     Ferrous Sulfate (IRON PO) Take 325 mg by mouth daily.     guaiFENesin (MUCINEX) 600 MG 12 hr tablet Take 2 tablets (1,200 mg total) by mouth 2 (two) times daily. (Patient taking differently: Take 600 mg by mouth 2 (two) times daily as needed for cough.) 30 tablet 0   montelukast (SINGULAIR) 10 MG tablet Take 1 tablet (10 mg total) by mouth at bedtime. 30 tablet 1   pantoprazole (PROTONIX) 40 MG tablet Take 1 tablet (40 mg total) by mouth 2 (two) times daily. (Patient taking differently: Take 40 mg by  mouth daily.) 60 tablet 1   sertraline (ZOLOFT) 50 MG tablet Take 1 tablet (50 mg total) by mouth daily. 30 tablet 0   TRELEGY ELLIPTA 200-62.5-25 MCG/INH AEPB INHALE 1 PUFF INTO THE LUNGS EVERY DAY 180 each 1   ursodiol (ACTIGALL) 250 MG tablet Take 250 mg by mouth 2 (two) times daily.     No current facility-administered medications for this encounter.     REVIEW OF SYSTEMS: On review of systems, the patient reports that she is doing well overall. She continues to use O2 at 2L and this has not changed. She denies any recent hospitalizations or COPD exacerbations. She does have chronic trouble with breathing when she's outside in the heat and humidity.  She denies any chest pain, shortness of  breath, cough. No other complaints are verbalized.      PHYSICAL EXAM:  Unable to assess due to encounter type.    ECOG = 1  0 - Asymptomatic (Fully active, able to carry on all predisease activities without restriction)  1 - Symptomatic but completely ambulatory (Restricted in physically strenuous activity but ambulatory and able to carry out work of a light or sedentary nature. For example, light housework, office work)  2 - Symptomatic, <50% in bed during the day (Ambulatory and capable of all self care but unable to carry out any work activities. Up and about more than 50% of waking hours)  3 - Symptomatic, >50% in bed, but not bedbound (Capable of only limited self-care, confined to bed or chair 50% or more of waking hours)  4 - Bedbound (Completely disabled. Cannot carry on any self-care. Totally confined to bed or chair)  5 - Death   Eustace Pen MM, Creech RH, Tormey DC, et al. [id number]). "Toxicity and response criteria of the [employer]". [place] Oncol. 5 (6): 649-55    LABORATORY DATA:  Lab Results  Component Value Date   WBC 5.4 02/08/2020   HGB 11.4 (L) 02/08/2020   HCT 35.8 (L) 02/08/2020   MCV 88.2 02/08/2020   PLT 233 02/08/2020   Lab Results   Component Value Date   NA 140 02/08/2020   K 3.7 02/08/2020   CL 108 02/08/2020   CO2 22 02/08/2020   Lab Results  Component Value Date   ALT 22 02/08/2020   AST 26 02/08/2020   ALKPHOS 255 (H) 02/08/2020   BILITOT 0.7 02/08/2020      RADIOGRAPHY: CT CHEST W CONTRAST  Result Date: 05/19/2021 CLINICAL DATA:  Non-small-cell lung cancer, status post SBRT. Diagnosed 6/20. Radiation therapy completed 8/20. Home oxygen and shortness of breath. EXAM: CT CHEST WITH CONTRAST TECHNIQUE: Multidetector CT imaging of the chest was performed during intravenous contrast administration. RADIATION DOSE REDUCTION: This exam was performed according to the departmental dose-optimization program which includes automated exposure control, adjustment of the mA and/or kV according to patient size and/or use of iterative reconstruction technique. CONTRAST:  44mL OMNIPAQUE IOHEXOL 300 MG/ML  SOLN COMPARISON:  11/12/2020 FINDINGS: Cardiovascular: Aortic atherosclerosis. Normal heart size, without pericardial effusion. Lad coronary artery calcification. No central pulmonary embolism, on this non-dedicated study. Mediastinum/Nodes: No supraclavicular adenopathy. No mediastinal or hilar adenopathy. Tiny hiatal hernia. Subtle fluid level in the esophagus on 61/2. Lungs/Pleura: No pleural fluid. Moderate centrilobular emphysema. Right hemidiaphragm elevation is unchanged. Linear right upper lobe pleural-based opacity is favored to represent scarring on 30/5 and is unchanged. Medial left upper lobe radiation change again identified medial to radiation fiducials. At the site of nodularity medially on the prior exam, there is increased soft tissue density extending towards the medial left pleural space. Compare image 29/5 today to image 31/5 of the prior exam. More laterally and inferiorly, the extent of presumed radiation change as evidenced by architectural distortion and ground-glass is similar. The 7 mm nodular density detailed  on the prior exam more anteriorly has resolved. Upper Abdomen: Segment 4 a 1.0 cm is unchanged and likely a cyst. Normal imaged portions of the spleen, pancreas, adrenal glands, right kidney. Upper pole 3 mm left renal collecting system calculus. Musculoskeletal: Osteopenia. Remote ninth posterolateral left rib fracture. IMPRESSION: 1. Left upper lobe radiation change. The anterior left upper lobe nodular density described on the prior exam has resolved. There is subtle increased soft tissue density medially,  as detailed above. This could simply represent evolving radiation induced consolidation or early local recurrence. Potential clinical strategies include three-month follow-up CT versus further evaluation with PET. 2. No thoracic adenopathy. 3. Aortic atherosclerosis (ICD10-I70.0), coronary artery atherosclerosis and emphysema (ICD10-J43.9). 4. Tiny hiatal hernia. Esophageal air fluid level suggests dysmotility or gastroesophageal reflux. Electronically Signed   By: Abigail Miyamoto M.D.   On: 05/19/2021 14:39        IMPRESSION/PLAN: 1. Putative Stage IA2, cT1bN0M0 NSCLC of the LUL. The patient appears to be radiographically without disease and we discussed the expected evolving scaring in the the lung adjacent to the treatment site. We will follow this closely and I recommended we proceed with repeat imaging in 6 months time per NCCN guidelines. She is in agreement and will notify me sooner if she has questions or concerns.   2. LLL nodule. This is no longer seen on imaging but we will follow up on subsequent visits to make sure no new or suspicious nodules need to be followed or treated. 3. COPD/Asthma. The patient was encouraged to continue to follow up with pulmonary medicine, as she continues with her oxygen at 2L [place]. We appreciate their help in maximizing her lung function.    Given current concerns for patient exposure during the COVID-19 pandemic, this encounter was conducted via telephone.  The  patient has provided two factor identification and has given verbal consent for this type of encounter and has been advised to only accept a meeting of this type in a secure network environment. The time spent during this encounter was 20 minutes including preparation, discussion, and coordination of the patient's care. The attendants for this meeting include Hayden Pedro  and Eugenio Hoes Truax.  During the encounter, Hayden Pedro was located at [facility] Radiation Oncology Department.  Kelcie Currie Marasco was located at home.     Carola Rhine, PAC

## 2021-05-23 NOTE — Progress Notes (Signed)
Radiation Oncology         ([phone]) [phone] ________________________________  Outpatient Follow Up - Conducted via telephone due to current COVID-19 concerns for limiting patient exposure  I spoke with the patient to conduct this consult visit via telephone to spare the patient unnecessary potential exposure in the healthcare setting during the current COVID-19 pandemic. The patient was notified in advance and was offered a [place] meeting to allow for face to face communication but unfortunately reported that they did not have the appropriate resources/technology to support such a visit and instead preferred to proceed with a telephone visit.    Name: Lori Butler        MRN: [id number]  Date of Service: 05/19/2021 DOB: 1951-03-26  CH:ENIDPO, Lattie Haw, MD  [name], MD     REFERRING PHYSICIAN: [name], MD   DIAGNOSIS: The encounter diagnosis was Malignant neoplasm of upper lobe of left lung [facility]).   HISTORY OF PRESENT ILLNESS: Lori Butler is a 71 y.o. female with a history of  Putative Stage IA2, cT1bN0M0 NSCLC of the LUL. She has oxygen dependant (2L [place]) COPD and Asthma and was sent for lung cancer screening scan on 10/14/2018 which revealed a 1.26 cm lesion in the left upper lobe, as well as a 3.6 mm lesion in the left lower lobe. No adenopathy was noted. She has a low attenuation lesion in the liver measuring 1.3 cm  And this was felt to be stable in comparison to prior studies. She underwent a PET scan on 10/18/2018 that revealed an SUV of 8.4 in the left upper lobe lesion that was seen on CT and measured 1.3 cm on PET. She did not have additional hypermetabolic changes in the other nodule or in thoracic nodes. The liver did not show metabolic change and was favored as a cyst. She did undergo bronchoscopy on 11/11/2018 which revealed atypical cells in her biopsy and cytology specimens without definitive carcinoma. She decided against surgical resection and proceeded with SBRT in the  summer of 2020. She has been followed in surveillance since and has been without recurrent disease.   Her most recent CT on 05/19/21 showed stable changes in the LUL but more nodular change medially extending toward the medial left pleural space. Another more anteriorly noted nodule in the LUL has also resolved since her last scan.  She continues to have stigmata of atherosclerosis of the aorta, emphysema, and a stable cystic structure in the left lobe of the liver. She's contacted today by phone to review her results.   PREVIOUS RADIATION THERAPY:    12/03/2018-12/10/2018 SBRT Treatment: The patient was treated to the LUL lung with a course of stereotactic body radiation treatment.  The patient received 54 Gray in 3 fractions using a IMRT technique, with 3 fields.   PAST MEDICAL HISTORY:  Past Medical History:  Diagnosis Date   Acute respiratory failure with hypoxia (HCC)    Anxiety    Aortic atherosclerosis ([place])    Arthritis    "a little in my hands probably" (10/31/2017)   Asthma    COPD (chronic obstructive pulmonary disease) (HCC)    COPD exacerbation (HCC)    Depression    History of kidney stones    lung ca dx'd 10/2018   Suicide attempt ([place]) 02/21/2018   overdosed on Wellbutrin       PAST SURGICAL HISTORY: Past Surgical History:  Procedure Laterality Date   ABDOMINAL HYSTERECTOMY     APPENDECTOMY     BREAST  BIOPSY Left 1990s   CATARACT EXTRACTION W/ INTRAOCULAR LENS  IMPLANT, BILATERAL Bilateral    DILATION AND CURETTAGE OF UTERUS     FUDUCIAL PLACEMENT N/A 11/11/2018   Procedure: PLACEMENT OF FUDUCIAL;  Surgeon: Melrose Nakayama, MD;  Location: [facility];  Service: Thoracic;  Laterality: N/A;   TONSILLECTOMY     TUBAL LIGATION     VIDEO BRONCHOSCOPY WITH ENDOBRONCHIAL NAVIGATION N/A 11/11/2018   Procedure: VIDEO BRONCHOSCOPY WITH ENDOBRONCHIAL NAVIGATION;  Surgeon: Melrose Nakayama, MD;  Location: [facility] OR;  Service: Thoracic;  Laterality: N/A;     FAMILY HISTORY:   Family History  Problem Relation Age of Onset   Breast cancer Mother      SOCIAL HISTORY:  reports that she quit smoking about 3 years ago. Her smoking use included cigarettes. She has a 76.50 pack-year smoking history. She has never used smokeless tobacco. She reports that she does not currently use alcohol. She reports that she does not use drugs. The patient is divorced and lives in [place].     ALLERGIES: Demerol [meperidine], Oxycodone-acetaminophen, and Tramadol   MEDICATIONS:  Current Outpatient Medications  Medication Sig Dispense Refill   albuterol (PROVENTIL) (2.5 MG/3ML) 0.083% nebulizer solution Take 3 mLs (2.5 mg total) by nebulization every 6 (six) hours as needed for wheezing or shortness of breath. 75 mL 6   albuterol (VENTOLIN HFA) 108 (90 Base) MCG/ACT inhaler Inhale 2 puffs into the lungs every 6 (six) hours as needed for wheezing or shortness of breath. 1 each 2   alendronate (FOSAMAX) 70 MG tablet Take 70 mg by mouth every Wednesday.      atorvastatin (LIPITOR) 10 MG tablet Take 10 mg by mouth daily.     benzonatate (TESSALON) 200 MG capsule Take 1 capsule (200 mg total) by mouth 3 (three) times daily as needed for cough. 30 capsule 3   Cholecalciferol (VITAMIN D) 50 MCG (2000 UT) tablet Take 2,000 Units by mouth daily.     Cyanocobalamin (VITAMIN B-12) 5000 MCG TBDP Take 1 tablet by mouth daily.     Ferrous Sulfate (IRON PO) Take 325 mg by mouth daily.     guaiFENesin (MUCINEX) 600 MG 12 hr tablet Take 2 tablets (1,200 mg total) by mouth 2 (two) times daily. (Patient taking differently: Take 600 mg by mouth 2 (two) times daily as needed for cough.) 30 tablet 0   montelukast (SINGULAIR) 10 MG tablet Take 1 tablet (10 mg total) by mouth at bedtime. 30 tablet 1   pantoprazole (PROTONIX) 40 MG tablet Take 1 tablet (40 mg total) by mouth 2 (two) times daily. (Patient taking differently: Take 40 mg by mouth daily.) 60 tablet 1   sertraline (ZOLOFT) 50 MG tablet Take 1  tablet (50 mg total) by mouth daily. 30 tablet 0   TRELEGY ELLIPTA 200-62.5-25 MCG/INH AEPB INHALE 1 PUFF INTO THE LUNGS EVERY DAY 180 each 1   ursodiol (ACTIGALL) 250 MG tablet Take 250 mg by mouth 2 (two) times daily.     No current facility-administered medications for this encounter.     REVIEW OF SYSTEMS: On review of systems, the patient reports that she is doing well since our last visit. She enjoyed the Christmas holiday and New Years. She continues to have shortness of breath with exertion, but her O2 demands remain stable at 2L [place] at home. She again verbalizes that the worst time she notes is in the heat and humidty of the summer when she's more short winded. No complaints  of  chest pain, shortness of breath, cough are verbalized.      PHYSICAL EXAM:  Unable to assess due to encounter type.    ECOG = 1  0 - Asymptomatic (Fully active, able to carry on all predisease activities without restriction)  1 - Symptomatic but completely ambulatory (Restricted in physically strenuous activity but ambulatory and able to carry out work of a light or sedentary nature. For example, light housework, office work)  2 - Symptomatic, <50% in bed during the day (Ambulatory and capable of all self care but unable to carry out any work activities. Up and about more than 50% of waking hours)  3 - Symptomatic, >50% in bed, but not bedbound (Capable of only limited self-care, confined to bed or chair 50% or more of waking hours)  4 - Bedbound (Completely disabled. Cannot carry on any self-care. Totally confined to bed or chair)  5 - Death   Eustace Pen MM, Creech RH, Tormey DC, et al. [phone]). "Toxicity and response criteria of the [facility]". [place] Oncol. 5 (6): 649-55    LABORATORY DATA:  Lab Results  Component Value Date   WBC 5.4 02/08/2020   HGB 11.4 (L) 02/08/2020   HCT 35.8 (L) 02/08/2020   MCV 88.2 02/08/2020   PLT 233 02/08/2020   Lab Results  Component  Value Date   NA 140 02/08/2020   K 3.7 02/08/2020   CL 108 02/08/2020   CO2 22 02/08/2020   Lab Results  Component Value Date   ALT 22 02/08/2020   AST 26 02/08/2020   ALKPHOS 255 (H) 02/08/2020   BILITOT 0.7 02/08/2020      RADIOGRAPHY: CT CHEST W CONTRAST  Result Date: 05/19/2021 CLINICAL DATA:  Non-small-cell lung cancer, status post SBRT. Diagnosed 6/20. Radiation therapy completed 8/20. Home oxygen and shortness of breath. EXAM: CT CHEST WITH CONTRAST TECHNIQUE: Multidetector CT imaging of the chest was performed during intravenous contrast administration. RADIATION DOSE REDUCTION: This exam was performed according to the departmental dose-optimization program which includes automated exposure control, adjustment of the mA and/or kV according to patient size and/or use of iterative reconstruction technique. CONTRAST:  60mL OMNIPAQUE IOHEXOL 300 MG/ML  SOLN COMPARISON:  11/12/2020 FINDINGS: Cardiovascular: Aortic atherosclerosis. Normal heart size, without pericardial effusion. Lad coronary artery calcification. No central pulmonary embolism, on this non-dedicated study. Mediastinum/Nodes: No supraclavicular adenopathy. No mediastinal or hilar adenopathy. Tiny hiatal hernia. Subtle fluid level in the esophagus on 61/2. Lungs/Pleura: No pleural fluid. Moderate centrilobular emphysema. Right hemidiaphragm elevation is unchanged. Linear right upper lobe pleural-based opacity is favored to represent scarring on 30/5 and is unchanged. Medial left upper lobe radiation change again identified medial to radiation fiducials. At the site of nodularity medially on the prior exam, there is increased soft tissue density extending towards the medial left pleural space. Compare image 29/5 today to image 31/5 of the prior exam. More laterally and inferiorly, the extent of presumed radiation change as evidenced by architectural distortion and ground-glass is similar. The 7 mm nodular density detailed on the  prior exam more anteriorly has resolved. Upper Abdomen: Segment 4 a 1.0 cm is unchanged and likely a cyst. Normal imaged portions of the spleen, pancreas, adrenal glands, right kidney. Upper pole 3 mm left renal collecting system calculus. Musculoskeletal: Osteopenia. Remote ninth posterolateral left rib fracture. IMPRESSION: 1. Left upper lobe radiation change. The anterior left upper lobe nodular density described on the prior exam has resolved. There is subtle increased soft  tissue density medially, as detailed above. This could simply represent evolving radiation induced consolidation or early local recurrence. Potential clinical strategies include three-month follow-up CT versus further evaluation with PET. 2. No thoracic adenopathy. 3. Aortic atherosclerosis (ICD10-I70.0), coronary artery atherosclerosis and emphysema (ICD10-J43.9). 4. Tiny hiatal hernia. Esophageal air fluid level suggests dysmotility or gastroesophageal reflux. Electronically Signed   By: Abigail Miyamoto M.D.   On: 05/19/2021 14:39        IMPRESSION/PLAN: 1. Putative Stage IA2, cT1bN0M0 NSCLC of the LUL. The patient appears to be radiographically well. I discussed that Dr. Lisbeth Renshaw and I have looked at her films including pre-treatment imaging and her treatment planning films in comparison. The area of nodular change mentioned on her recent CT is within the field of her prior radiation and favored to be post treatment scarring. Dr. Lisbeth Renshaw favors following thise with a shorter interval scan to ensure this doesn't become more suspicious to radiology. She is in agreement, if the next is stable, we would return back to  repeat imaging 6 months thereafter. She is in agreement and will notify me sooner if she has questions or concerns.   2. LLL nodule. Again this has resolved with serial imaging but we will follow up on subsequent visits to make sure no new or suspicious nodules need to be followed or treated. 3. COPD/Asthma. The patient continues  to follow up with pulmonary medicine, as she uses oxygen at 2L [place]. We appreciate their help in continuing to maximize her lung function.    Given current concerns for patient exposure during the COVID-19 pandemic, this encounter was conducted via telephone.  The patient has provided two factor identification and has given verbal consent for this type of encounter and has been advised to only accept a meeting of this type in a secure network environment. The time spent during this encounter was 30 minutes including preparation, discussion, and coordination of the patient's care. The attendants for this meeting include Hayden Pedro  and Eugenio Hoes Goldberg.  During the encounter, Hayden Pedro was located at [facility] Radiation Oncology Department.  Kearsten Ginther Beam was located at home.     Carola Rhine, PAC

## 2021-06-14 ENCOUNTER — Other Ambulatory Visit: Payer: Self-pay

## 2021-06-14 NOTE — Patient Outreach (Signed)
[facility]) Care Management  06/14/2021  Lianne Carreto Mcnear 28-Aug-1950 [id number]   Telephone Assessment    Outreach attempt to patient. Spoke briefly with patient. She reports things are not going too well as she just received bad news regarding her sister in [place]. She states sister is not doing well on ventilator. Support given. Patient requested that RN CM call back at another time.     Plan: RN CM will make outreach attempt within 3-4 wks to patient.   [name], RN,BSN,CCM [place] Management Telephonic Care Management Coordinator Direct Phone: [phone] Toll Free: [phone] Fax: [phone]

## 2021-06-28 DIAGNOSIS — R6889 Other general symptoms and signs: Secondary | ICD-10-CM | POA: Diagnosis not present

## 2021-06-28 DIAGNOSIS — R748 Abnormal levels of other serum enzymes: Secondary | ICD-10-CM | POA: Diagnosis not present

## 2021-06-30 ENCOUNTER — Other Ambulatory Visit: Payer: Self-pay

## 2021-06-30 NOTE — Patient Outreach (Signed)
[facility]) Care Management  06/30/2021  Lori Butler 04-Jun-1950 [id number]   Telephone Assessment Annual Assessment   Telephone call to follow up from previous call.  Successful outreach call to patient. She reports that she is leaving to go visit her sister in [place] who is in SNF this weekend. She is unsure of how long she plans to stay there. RN CM confirmed with patient she has all neccessary medical items(oxygen, meds,etc) to accompany her on trip. Her nephew will be driving her there. Patient not due to see PCP until June and sees specialist in April. She reports she had recent lab work done and has not heard from MD office so she is assuming everything was normal. She denies any RN CM needs or concerns at this time.    Medications Reviewed Today     Reviewed by Hayden Pedro, RN (Registered Nurse) on 06/30/21 at 67  Med List Status: <None>   Medication Order Taking? Sig Documenting Provider Last Dose Status Informant  albuterol (PROVENTIL) (2.5 MG/3ML) 0.083% nebulizer solution [id number] No Take 3 mLs (2.5 mg total) by nebulization every 6 (six) hours as needed for wheezing or shortness of breath. Rigoberto Noel, MD Taking Active Self  albuterol (VENTOLIN HFA) 108 (90 Base) MCG/ACT inhaler [id number]  Inhale 2 puffs into the lungs every 6 (six) hours as needed for wheezing or shortness of breath. Magdalen Spatz, NP  Active   alendronate (FOSAMAX) 70 MG tablet [id number] No Take 70 mg by mouth every Wednesday.  [provider] Taking Active Self           Med Note [name] Dec 24, 2017  2:52 PM)    atorvastatin (LIPITOR) 10 MG tablet [id number] No Take 10 mg by mouth daily. [provider] Taking Active Self  benzonatate (TESSALON) 200 MG capsule [id number] No Take 1 capsule (200 mg total) by mouth 3 (three) times daily as needed for cough. Lauraine Rinne, NP Taking Active   Cholecalciferol (VITAMIN D) 50 MCG (2000 UT)  tablet [id number] No Take 2,000 Units by mouth daily. [provider] Taking Active Self  Cyanocobalamin (VITAMIN B-12) 5000 MCG TBDP [id number] No Take 1 tablet by mouth daily. [provider] Taking Active Self  Ferrous Sulfate (IRON PO) [id number] No Take 325 mg by mouth daily. [provider] Taking Active   guaiFENesin (MUCINEX) 600 MG 12 hr tablet [id number] No Take 2 tablets (1,200 mg total) by mouth 2 (two) times daily.  Patient taking differently: Take 600 mg by mouth 2 (two) times daily as needed for cough.   Martyn Ehrich, NP Taking Active   montelukast (SINGULAIR) 10 MG tablet [id number] No Take 1 tablet (10 mg total) by mouth at bedtime. Arnell Asal, NP Taking Active Self  pantoprazole (PROTONIX) 40 MG tablet [id number] No Take 1 tablet (40 mg total) by mouth 2 (two) times daily.  Patient taking differently: Take 40 mg by mouth daily.   Arnell Asal, NP Taking Active   sertraline (ZOLOFT) 50 MG tablet [id number] No Take 1 tablet (50 mg total) by mouth daily. [name], MD Taking Active Self  [name] 200-62.5-25 MCG/INH AEPB [id number]  INHALE 1 PUFF INTO THE LUNGS EVERY DAY Rigoberto Noel, MD  Active   ursodiol (ACTIGALL) 250 MG tablet [id number]  Take 250 mg by mouth 2 (two) times daily. [provider]  Active Self  Care Plan : RN Care Manager POC  Updates made by Hayden Pedro, RN since 06/30/2021 12:00 AM     Problem: Chronic Disease Mgmt of Chronic Conditions-COPD   Priority: High     Long-Range Goal: Development of POC for Mgmt of Chronic Conditions-COPD   Start Date: 03/23/2021  Expected End Date: 03/23/2022  This Visit's Progress: On track  Priority: High  Note:     Current Barriers:  Chronic Disease Management support and education needs related to COPD  RNCM Clinical Goal(s):  Patient will take all medications exactly as prescribed and will call provider for medication related  questions attend all scheduled medical appointments: including PCP and specialists continue to work with RN Care Manager to address care management and care coordination needs related to  COPD will demonstrate ongoing self health care management ability for mgmt of chronic conditiond  through collaboration with RN Care manager, provider, and care team.   Interventions: POC sent to PCP upon initial assessment, quarterly and with any changes in patient's conditions Inter-disciplinary care team collaboration (see longitudinal plan of care) Evaluation of current treatment plan related to  self management and patient's adherence to plan as established by provider   COPD Interventions:  (Status:  Goal on track:  Yes.) Long Term Goal  Advised patient to track and manage COPD triggers;  Advised patient to self assesses COPD action plan zone and make appointment with provider if in the yellow zone for 48 hours without improvement; Assessed for immunization status-pt completed getting COVID booster, flu and PNA vaccine last month  Patient Goals/Self-Care Activities: Patient will self administer medications as prescribed Patient will attend all scheduled provider appointments Patient will continue to perform ADL's independently Patient will have no exacerbation of sxs   03/23/21-Patient pleased to report how well her COPD sxs have been lately. She has not had to use prn meds  06/30/21-Patient reports no issues with COPD lately-sxs managed.    Follow Up Plan:  Telephone follow up appointment with care management team member scheduled for:  within the month of April The patient has been provided with contact information for the care management team and has been advised to call with any health related questions or concerns.        Plan:  RN CM discussed with patient next outreach within the month of April. Patient agrees to care plan and follow up.  [name], RN,BSN,CCM [place]  Management Telephonic Care Management Coordinator Direct Phone: [phone] Toll Free: [phone] Fax: [phone]

## 2021-07-01 ENCOUNTER — Ambulatory Visit: Payer: Self-pay

## 2021-07-06 ENCOUNTER — Telehealth: Payer: Self-pay | Admitting: Pulmonary Disease

## 2021-07-06 MED ORDER — PREDNISONE 10 MG PO TABS: 20.0000 mg | ORAL_TABLET | Freq: Every day | ORAL | 0 refills | Status: DC

## 2021-07-06 NOTE — Telephone Encounter (Signed)
Rigoberto Noel, MD  You; [facility] Triage Pool 2 minutes ago (11:09 AM)  ? ?OK to send  ?Prednisone 10 mg tabs  Take 2 tabs daily with food x 5ds, then 1 tab daily with food x 5ds then STOP   ? ? ? ?Rx for prednisone has been sent to preferred pharmacy for pt. Attempted to call pt to let her know this had been done but unable to reach. Left a detailed message for pt letting her know this was done. Nothing further needed. ?

## 2021-07-06 NOTE — Telephone Encounter (Signed)
Called and spoke with pt who states that she has had increased SOB for the last two days and also has been wheezing. ? ?Pt said that she has been under a lot of stress recently as her sister is very ill and in the hospital. Pt states that she is currently in [place] with her sister. ? ?Pt states that her HR has been elevated due to stress dealing with her sister. States that her O2 sats have been good ranging between 94-97% on her 2L O2. ? ?Pt is using her Trelegy inhaler every day as prescribed. States that she has not used her rescue inhaler any. Pt has not used her nebulizer as she did not bring it with her to [place]. ? ?Due to her breathing and wheezing, pt is recommending to have prednisone prescribed to see if that would help. ? ?Pt has not been seen by Dr. Elsworth Soho since 04/09/20 and last OV with APP was 08/2020 which was a video visit. ? ? ?No provider has any opening today 3/1 for a video visit. Dr. Elsworth Soho, please advise. ?

## 2021-08-09 ENCOUNTER — Telehealth: Payer: Self-pay | Admitting: Pulmonary Disease

## 2021-08-09 NOTE — Telephone Encounter (Signed)
Spoke with the pt  ?She is c/o cough and increased SOB past few days  ?No fevers, aches  ?Pt not seen in office since Jan 2022  ?I advised needs sooner appt than May and set her up to see [facility] on 08/15/21  ?Advised seek emergent care sooner if needed ?

## 2021-08-10 ENCOUNTER — Other Ambulatory Visit: Payer: Self-pay | Admitting: Pulmonary Disease

## 2021-08-15 ENCOUNTER — Encounter: Payer: Self-pay | Admitting: Nurse Practitioner

## 2021-08-15 ENCOUNTER — Ambulatory Visit (INDEPENDENT_AMBULATORY_CARE_PROVIDER_SITE_OTHER): Payer: Medicare HMO

## 2021-08-15 ENCOUNTER — Ambulatory Visit: Payer: Medicare HMO | Admitting: Nurse Practitioner

## 2021-08-15 VITALS — BP 132/86 | HR 90 | Temp 98.2°F | Ht 66.0 in | Wt 177.0 lb

## 2021-08-15 DIAGNOSIS — J9611 Chronic respiratory failure with hypoxia: Secondary | ICD-10-CM

## 2021-08-15 DIAGNOSIS — J302 Other seasonal allergic rhinitis: Secondary | ICD-10-CM

## 2021-08-15 DIAGNOSIS — C3412 Malignant neoplasm of upper lobe, left bronchus or lung: Secondary | ICD-10-CM | POA: Diagnosis not present

## 2021-08-15 DIAGNOSIS — J441 Chronic obstructive pulmonary disease with (acute) exacerbation: Secondary | ICD-10-CM

## 2021-08-15 DIAGNOSIS — R0602 Shortness of breath: Secondary | ICD-10-CM

## 2021-08-15 DIAGNOSIS — R0600 Dyspnea, unspecified: Secondary | ICD-10-CM | POA: Diagnosis not present

## 2021-08-15 DIAGNOSIS — J45901 Unspecified asthma with (acute) exacerbation: Secondary | ICD-10-CM

## 2021-08-15 DIAGNOSIS — R6889 Other general symptoms and signs: Secondary | ICD-10-CM | POA: Diagnosis not present

## 2021-08-15 DIAGNOSIS — R059 Cough, unspecified: Secondary | ICD-10-CM | POA: Diagnosis not present

## 2021-08-15 HISTORY — DX: Other seasonal allergic rhinitis: J30.2

## 2021-08-15 LAB — BASIC METABOLIC PANEL
BUN: 14 mg/dL (ref 6–23)
CO2: 28 mEq/L (ref 19–32)
Calcium: 9.2 mg/dL (ref 8.4–10.5)
Chloride: 104 mEq/L (ref 96–112)
Creatinine, Ser: 0.68 mg/dL (ref 0.40–1.20)
GFR: 87.34 mL/min (ref >60.00)
Glucose, Bld: 83 mg/dL (ref 70–99)
Potassium: 4 mEq/L (ref 3.5–5.1)
Sodium: 141 mEq/L (ref 135–145)

## 2021-08-15 LAB — D-DIMER, QUANTITATIVE: D-Dimer, Quant: 0.5 mcg/mL FEU — ABNORMAL HIGH (ref <0.50)

## 2021-08-15 LAB — POCT EXHALED NITRIC OXIDE: FeNO level (ppb): 7

## 2021-08-15 MED ORDER — FLUTICASONE PROPIONATE 50 MCG/ACT NA SUSP
1.0000 | Freq: Every day | NASAL | 2 refills | Status: DC
Start: 2021-08-15 — End: 2021-10-20

## 2021-08-15 MED ORDER — PREDNISONE 10 MG PO TABS
ORAL_TABLET | ORAL | 0 refills | Status: DC
Start: 2021-08-15 — End: 2021-09-13

## 2021-08-15 MED ORDER — BREZTRI AEROSPHERE 160-9-4.8 MCG/ACT IN AERO: 2.0000 | INHALATION_SPRAY | Freq: Two times a day (BID) | RESPIRATORY_TRACT | 0 refills | Status: DC

## 2021-08-15 MED ORDER — BREZTRI AEROSPHERE 160-9-4.8 MCG/ACT IN AERO: 2.0000 | INHALATION_SPRAY | Freq: Two times a day (BID) | RESPIRATORY_TRACT | 5 refills | Status: DC

## 2021-08-15 MED ORDER — LORATADINE 10 MG PO TABS: 10.0000 mg | ORAL_TABLET | Freq: Every day | ORAL | 5 refills | Status: DC

## 2021-08-15 MED ORDER — SPACER/AERO-HOLDING CHAMBERS DEVI: 2 refills | Status: DC

## 2021-08-15 NOTE — Assessment & Plan Note (Signed)
Unable to r/o PE given recent travel, SOB, and pleuritic pain. D dimer today. Will obtain CTA if positive for further evaluation. Strict ED precautions in interim.  ?

## 2021-08-15 NOTE — Assessment & Plan Note (Signed)
Oxygen stable with no increased O2 demand. Continue supplemental O2 2 lpm for goal SpO2 >88-90% ?

## 2021-08-15 NOTE — Assessment & Plan Note (Signed)
Increased symptoms. Advised to start daily antihistamine and intranasal steroid. Suspect contributing to cough. Initiate cough control with delsym.  ?

## 2021-08-15 NOTE — Progress Notes (Signed)
? ?@Patient  ID: Lori Butler, female    DOB: 19-Apr-1951, 71 y.o.   MRN: [id number] ? ?Chief Complaint  ?Patient presents with  ? Follow-up  ?  SOB and Mostly dry cough   ? ? ?Referring provider: ?[name], MD ? ?HPI: ?71 year old female, former smoker (76.5 pack years) followed for COPD with asthma, eosinophilia, and chronic respiratory failure with hypoxia.  LDCT in 2020 showed lung RADS 4x.  She underwent PET scan in June 2020 with positive for left upper lobe hypermetabolic spiculated pulmonary nodules consistent with primary bronchogenic carcinoma.  She was referred to CT surgery for second opinion.  Seen by Dr. Roxan Hockey on 10/29/2018.  Left upper lobe pulmonary nodule felt to be most likely NSCLC.  Patient not interested in surgery.  Underwent navigational bronchoscopy for biopsy and fiducial placement July 2020.  Pathology and cytology were nondiagnostic with atypical cells; no malignant cells.  Case discussed at [facility], felt patient could continue observation versus stereotactic therapy.  Patient did not wish to wait and wanted to proceed with radiation therapy despite known diagnosis of definitive carcinoma.  She began SBRT 12/03/2018 and completed treatment on 12/10/2018.  She is currently under surveillance with radiation oncology. Past medical history significant for anxiety/depression, attempted suicide, tobacco abuse. ? ?TEST/EVENTS:  ?12/2017: Eosinophils 700 ?10/18/2018 PFTs: FVC 64, FEV1 47, ratio 56, TLC 100%, DLCOunc 52. + BD (25% change).  Severe obstructive airway disease with severe diffusion defect. ?03/01/2019 echo: 55-60%. Impaired relaxation of LV. Trace MR. Mildly elevated PASP ?05/19/2021 CT chest with contrast: Atherosclerosis present.  Tiny hiatal hernia.  There is moderate centrilobular emphysema.  Chronic elevation of right hemidiaphragm.  Linear right upper lobe pleural-based opacity is favored to represent scarring.  This is unchanged.  Medial left upper lobe radiation change  again identified.  There is an area of increased soft tissue density extending towards the medial left pleural space.  Concern for early local recurrence versus evolving radiation-induced consolidation.  Advised 3 month follow up. 7 mm nodule LUL resolved.  ? ?08/09/2020: Virtual visit with [place],NP.  Continued on Trelegy.  Felt like she did finally take a deep breath with the use.  Has not used rescue inhaler in 3 months.  Previous prednisone taper was Christmas 2021.  Sats are stable on 2 L supplemental O2.  Which is her baseline plans for repeat CT chest 11/15/2020 for surveillance for lung cancer.  No changes to current regimen. ? ?08/15/2021: Today-acute visit ?Patient presents today for worsening shortness of breath and occasional cough.  She has also had some pleuritic pain on the posterior left side.  Her shortness of breath is primarily upon exertion.  She does notice an occasional wheeze.  She also has some allergy type symptoms including nasal congestion and runny nose.  She was like she has not been able to effectively take her Trelegy because she has not been able to take a deep breath.  She did recently travel to [place].  She was in the car for 7+ hours.  She returned at the beginning of this month which was when her symptoms develop.  She has not had any hemoptysis, calf pain, redness, or swelling.  No increasing oxygen demand.  Maintained on 2 L/min supplemental O2.  Continues to take Singulair at bedtime.  Has been using Mucinex twice daily.  She has been using albuterol 1-2 times a day. ? ?Allergies  ?Allergen Reactions  ? Demerol [Meperidine] Other (See Comments)  ?  "horrible reaction"  ?  Oxycodone-Acetaminophen Other (See Comments)  ?  Upset stomach   ? Tramadol Nausea And Vomiting  ? ? ?Immunization History  ?Administered Date(s) Administered  ? Influenza Split 01/07/2019  ? Influenza, High Dose Seasonal PF 02/02/2017, 01/21/2018, 01/07/2019, 02/20/2020  ? Influenza,inj,Quad PF,6+ Mos 02/03/2016  ?  Influenza,inj,quad, With Preservative 02/15/2015  ? Influenza-Unspecified 01/07/2019, 02/20/2020, 02/16/2021  ? PFIZER(Purple Top)SARS-COV-2 Vaccination 08/14/2019, 09/05/2019  ? Ambulance person Booster 5y-11y 02/16/2021  ? Pneumococcal Conjugate-13 02/03/2016, 12/06/2017  ? Pneumococcal Polysaccharide-23 02/02/2017  ? Pneumococcal-Unspecified 02/16/2021  ? Tdap 07/07/2008  ? ? ?Past Medical History:  ?Diagnosis Date  ? Acute respiratory failure with hypoxia ([place])   ? Anxiety   ? Aortic atherosclerosis ([place])   ? Arthritis   ? "a little in my hands probably" (10/31/2017)  ? Asthma   ? COPD (chronic obstructive pulmonary disease) ([place])   ? COPD exacerbation ([place])   ? Depression   ? History of kidney stones   ? lung ca dx'd 10/2018  ? Suicide attempt ([place]) 02/21/2018  ? overdosed on Wellbutrin  ? ? ?Tobacco History: ?Social History  ? ?Tobacco Use  ?Smoking Status Former  ? Packs/day: 1.50  ? Years: 51.00  ? Pack years: 76.50  ? Types: Cigarettes  ? Quit date: 09/05/2017  ? Years since quitting: 3.9  ?Smokeless Tobacco Never  ? ?Counseling given: Not Answered ? ? ?Outpatient Medications Prior to Visit  ?Medication Sig Dispense Refill  ? albuterol (PROVENTIL) (2.5 MG/3ML) 0.083% nebulizer solution Take 3 mLs (2.5 mg total) by nebulization every 6 (six) hours as needed for wheezing or shortness of breath. 75 mL 6  ? albuterol (VENTOLIN HFA) 108 (90 Base) MCG/ACT inhaler Inhale 2 puffs into the lungs every 6 (six) hours as needed for wheezing or shortness of breath. 1 each 2  ? alendronate (FOSAMAX) 70 MG tablet Take 70 mg by mouth every Wednesday.     ? atorvastatin (LIPITOR) 10 MG tablet Take 10 mg by mouth daily.    ? Cholecalciferol (VITAMIN D) 50 MCG (2000 UT) tablet Take 2,000 Units by mouth daily.    ? Cyanocobalamin (VITAMIN B-12) 5000 MCG TBDP Take 1 tablet by mouth daily.    ? Ferrous Sulfate (IRON PO) Take 325 mg by mouth daily.    ? guaiFENesin (MUCINEX) 600 MG 12 hr tablet Take 2 tablets (1,200  mg total) by mouth 2 (two) times daily. (Patient taking differently: Take 600 mg by mouth 2 (two) times daily as needed for cough.) 30 tablet 0  ? montelukast (SINGULAIR) 10 MG tablet Take 1 tablet (10 mg total) by mouth at bedtime. 30 tablet 1  ? pantoprazole (PROTONIX) 40 MG tablet Take 1 tablet (40 mg total) by mouth 2 (two) times daily. (Patient taking differently: Take 40 mg by mouth daily.) 60 tablet 1  ? sertraline (ZOLOFT) 50 MG tablet Take 1 tablet (50 mg total) by mouth daily. 30 tablet 0  ? ursodiol (ACTIGALL) 250 MG tablet Take 250 mg by mouth 2 (two) times daily.    ? TRELEGY ELLIPTA 200-62.5-25 MCG/ACT AEPB INHALE 1 PUFF INTO THE LUNGS EVERY DAY 180 each 1  ? benzonatate (TESSALON) 200 MG capsule Take 1 capsule (200 mg total) by mouth 3 (three) times daily as needed for cough. 30 capsule 3  ? predniSONE (DELTASONE) 10 MG tablet Take 2 tablets (20 mg total) by mouth daily with breakfast. 10 tablet 0  ? ?No facility-administered medications prior to visit.  ? ? ? ?Review of Systems:  ? ?  Constitutional: No weight loss or gain, night sweats, fevers, chills. +fatigue ?HEENT: No headaches, difficulty swallowing, tooth/dental problems, or sore throat. No sneezing, itching, ear ache. +nasal congestion/drainage (occasional, clear) ?CV:  No chest pain, orthopnea, PND, swelling in lower extremities, anasarca, dizziness, palpitations, syncope ?Resp: +shortness of breath with exertion; dry cough; occasional wheeze; pleuritic pain. No excess mucus or change in color of mucus. No hemoptysis. No chest wall deformity ?GI:  No heartburn, indigestion, abdominal pain, nausea, vomiting, diarrhea, change in bowel habits, loss of appetite, bloody stools.  ?GU: No dysuria, change in color of urine, urgency or frequency.  No flank pain, no hematuria  ?Skin: No rash, lesions, ulcerations ?MSK:  No joint pain or swelling.  No decreased range of motion.  No back pain. ?Neuro: No dizziness or lightheadedness.  ?Psych: No  depression or anxiety. Mood stable.  ? ? ? ?Physical Exam: ? ?BP 132/86 (BP Location: Right Arm, Cuff Size: Normal)   Pulse 90   Temp 98.2 ?F (36.8 ?C) (Oral)   Ht 5\' 6"  (1.676 m)   Wt 177 lb (80.3 kg)   SpO2 94%

## 2021-08-15 NOTE — Assessment & Plan Note (Signed)
AECOPD/asthma. Prednisone taper. Hold off on abx for time being as cough is non-productive and CXR without superimposed infection. Trial change to [facility] with spacer until symptoms improve. Continue PRN albuterol. Close follow up ? ?Patient Instructions  ?-Stop Trelegy. Trial Breztri  2 puffs Twice daily. Use with spacer. Brush tongue, rinse and gargle afterwards.  ?-Continue Albuterol inhaler 2 puffs or 3 mL neb every 6 hours as needed for shortness of breath or wheezing. Notify if symptoms persist despite rescue inhaler/neb use. ?-Continue mucinex 600 mg Twice daily  ?-Continue singulair 10 mg At bedtime  ?-Continue pantoprazole 40 mg daily  ? ?-Prednisone taper. 4 tabs for 2 days, then 3 tabs for 2 days, 2 tabs for 2 days, then 1 tab for 2 days, then stop ?-Delsym 2 tsp Twice daily as needed for cough  ?-Claritin (loratidine) 10 mg daily for allergies  ?-Flonase nasal spray 1-2 sprays each nostril daily for allergies  ? ?Chest x ray today. ? ?Labs today - BMET and d dimer  ? ?Follow up in 2 weeks with Dr. Elsworth Soho or Alanson Aly. If symptoms do not improve or worsen, please contact office for sooner follow up or seek emergency care. ? ? ?

## 2021-08-15 NOTE — Patient Instructions (Addendum)
-  Stop Trelegy. Trial Breztri  2 puffs Twice daily. Use with spacer. Brush tongue, rinse and gargle afterwards.  ?-Continue Albuterol inhaler 2 puffs or 3 mL neb every 6 hours as needed for shortness of breath or wheezing. Notify if symptoms persist despite rescue inhaler/neb use. ?-Continue mucinex 600 mg Twice daily  ?-Continue singulair 10 mg At bedtime  ?-Continue pantoprazole 40 mg daily  ? ?-Prednisone taper. 4 tabs for 2 days, then 3 tabs for 2 days, 2 tabs for 2 days, then 1 tab for 2 days, then stop ?-Delsym 2 tsp Twice daily as needed for cough  ?-Claritin (loratidine) 10 mg daily for allergies  ?-Flonase nasal spray 1-2 sprays each nostril daily for allergies  ? ?Chest x ray today. ? ?Labs today - BMET and d dimer  ? ?Follow up in 2 weeks with Dr. Elsworth Soho or Alanson Aly. If symptoms do not improve or worsen, please contact office for sooner follow up or seek emergency care. ?

## 2021-08-15 NOTE — Assessment & Plan Note (Signed)
Recent CT with increased soft tissue density - concern for recurrence vs radiation changes. Plans for CT chest 3 month follow up sometime this month. Follow up with radiation oncology as scheduled.  ?

## 2021-08-16 ENCOUNTER — Ambulatory Visit (INDEPENDENT_AMBULATORY_CARE_PROVIDER_SITE_OTHER)
Admission: RE | Admit: 2021-08-16 | Discharge: 2021-08-16 | Disposition: A | Discharge status: Home / Self Care | Payer: Medicare HMO | Source: Ambulatory Visit | Attending: Nurse Practitioner | Admitting: Nurse Practitioner

## 2021-08-16 ENCOUNTER — Telehealth: Payer: Self-pay | Admitting: *Deleted

## 2021-08-16 ENCOUNTER — Other Ambulatory Visit: Payer: Self-pay | Admitting: Radiation Oncology

## 2021-08-16 DIAGNOSIS — R0602 Shortness of breath: Secondary | ICD-10-CM | POA: Diagnosis not present

## 2021-08-16 DIAGNOSIS — J439 Emphysema, unspecified: Secondary | ICD-10-CM | POA: Diagnosis not present

## 2021-08-16 MED ORDER — IOHEXOL 350 MG/ML SOLN: 80.0000 mL | Freq: Once | INTRAVENOUS | Status: DC | PRN

## 2021-08-16 MED ORDER — IOHEXOL 350 MG/ML SOLN
80.0000 mL | Freq: Once | INTRAVENOUS | Status: AC | PRN
  Administered 2021-08-16: 80 mL via INTRAVENOUS

## 2021-08-16 NOTE — Addendum Note (Signed)
Addended by: Clayton Bibles on: 08/16/2021 08:52 AM ? ? Modules accepted: Orders ? ?

## 2021-08-16 NOTE — Progress Notes (Signed)
Please notify pt d dimer was borderline positive (0.5). She will need a CTA chest STAT to r/o PE. PCCs will contact her for scheduling. Thanks.

## 2021-08-16 NOTE — Telephone Encounter (Signed)
Spoke with the patient to let her know we looked at the report from her recent CT angio chest.  Scan showed stable post radiation changes, no signs of blood clots.  Patient verbalized understanding.  She was informed we would get another CT chest around October. ? ?Lori Butler M. Leonie Green, BSN  ?

## 2021-08-16 NOTE — Progress Notes (Signed)
Pt called nursing line in our dept and asked if her scan from a different provider of the chest could substitute for her scan due in May for surveillance of her history of early stage left lung cancer. She has remained NED since her radiation, and stable post radiation changes are again noted. No embolism, and chronic changes consistent with atherosclerotic disease and emphysema. Her orders were changed to October and nursing is planning to confirm my recommendations.  ? ?

## 2021-08-17 NOTE — Progress Notes (Signed)
Notified pt CTA negative for PE. Imaging showed severe centrilobular and paraseptal emphysema. The left apical mass with post treatment appearance was unchanged and measured 2.9x1.9cm. Imaging and results sent to radiation/oncology as well. Pt verbalized understanding and had no further questions. Felt as though the prednisone was starting to help her breathing.

## 2021-08-19 NOTE — Progress Notes (Signed)
Yes, she is! Perfect, thank you!

## 2021-08-25 ENCOUNTER — Other Ambulatory Visit: Payer: Self-pay

## 2021-08-25 NOTE — Patient Outreach (Signed)
[place] [facility]) Care Management ? ?08/25/2021 ? ?Lori Butler ?1950/08/31 ?[id number] ? ? ?Telephone Assessment ? ? ?Successful outreach call placed to patient. She shares that she just recently got back from [place] trip. Patient shares that she went to visit her sister in SNF and she ultimately passed away. Condolences and support given. Patient grieving and coping appropriately. Patient continues to reside in her home alone. She has supportive son that stays nearby and assists her as needed. Appetite fair. Wgt stable. Denies any recent falls. Breathing has been managed.She is pleased to report that most recent CT scan shows cancer remains in remission. Denies any RN CM needs or concerns at this time.  ? ?Medications Reviewed Today   ? ? Reviewed by Hayden Pedro, RN (Registered Nurse) on 08/25/21 at 1017  Med List Status: <None>  ? ?Medication Order Taking? Sig Documenting Provider Last Dose Status Informant  ?albuterol (PROVENTIL) (2.5 MG/3ML) 0.083% nebulizer solution [id number] No Take 3 mLs (2.5 mg total) by nebulization every 6 (six) hours as needed for wheezing or shortness of breath. Rigoberto Noel, MD Taking Active Self  ?albuterol (VENTOLIN HFA) 108 (90 Base) MCG/ACT inhaler [id number] No Inhale 2 puffs into the lungs every 6 (six) hours as needed for wheezing or shortness of breath. Magdalen Spatz, NP Taking Active   ?alendronate (FOSAMAX) 70 MG tablet [id number] No Take 70 mg by mouth every Wednesday.  [provider] Taking Active Self  ?         ?Med Note Daymon Larsen   Mon Dec 24, 2017  2:52 PM)    ?atorvastatin (LIPITOR) 10 MG tablet [id number] No Take 10 mg by mouth daily. [provider] Taking Active Self  ?Budeson-Glycopyrrol-Formoterol (BREZTRI AEROSPHERE) 160-9-4.8 MCG/ACT AERO [id number]  Inhale 2 puffs into the lungs in the morning and at bedtime. Clayton Bibles, NP  Active   ?Cholecalciferol (VITAMIN D) 50 MCG (2000 UT) tablet [id number]  No Take 2,000 Units by mouth daily. [provider] Taking Active Self  ?Cyanocobalamin (VITAMIN B-12) 5000 MCG TBDP [id number] No Take 1 tablet by mouth daily. [provider] Taking Active Self  ?Ferrous Sulfate (IRON PO) [id number] No Take 325 mg by mouth daily. [provider] Taking Active   ?fluticasone (FLONASE) 50 MCG/ACT nasal spray [id number]  Place 1-2 sprays into both nostrils daily. Cobb, Karie Schwalbe, NP  Active   ?guaiFENesin (MUCINEX) 600 MG 12 hr tablet [id number] No Take 2 tablets (1,200 mg total) by mouth 2 (two) times daily.  ?Patient taking differently: Take 600 mg by mouth 2 (two) times daily as needed for cough.  ? Martyn Ehrich, NP Taking Active   ?loratadine (CLARITIN) 10 MG tablet [id number]  Take 1 tablet (10 mg total) by mouth daily. Cobb, Karie Schwalbe, NP  Active   ?montelukast (SINGULAIR) 10 MG tablet [id number] No Take 1 tablet (10 mg total) by mouth at bedtime. Arnell Asal, NP Taking Active Self  ?pantoprazole (PROTONIX) 40 MG tablet [id number] No Take 1 tablet (40 mg total) by mouth 2 (two) times daily.  ?Patient taking differently: Take 40 mg by mouth daily.  ? Arnell Asal, NP Taking Active   ?predniSONE (DELTASONE) 10 MG tablet [id number]  4 tabs for 2 days, then 3 tabs for 2 days, 2 tabs for 2 days, then 1 tab for 2 days, then stop Cobb, Karie Schwalbe, NP  Active   ?sertraline (ZOLOFT) 50 MG tablet [id number] No Take 1 tablet (50  mg total) by mouth daily. McNew, Tyson Babinski, MD Taking Active Self  ?Spacer/Aero-Holding Dorise Bullion [id number]  Use with inhaler Belenda Cruise Karie Schwalbe, NP  Active   ?ursodiol (ACTIGALL) 250 MG tablet [id number] No Take 250 mg by mouth 2 (two) times daily. [provider] Taking Active Self  ? ?  ?  ? ?  ?  ?Care Plan : RN Care Manager POC  ?Updates made by Hayden Pedro, RN since 08/25/2021 12:00 AM  ?  ? ?Problem: Chronic Disease Mgmt of Chronic Conditions-COPD   ?Priority: High  ?  ? ?Long-Range Goal:  Development of POC for Mgmt of Chronic Conditions-COPD   ?Start Date: 03/23/2021  ?Expected End Date: 03/23/2022  ?This Visit's Progress: On track  ?Recent Progress: On track  ?Priority: High  ?Note:   ? ? ?Current Barriers:  ?Chronic Disease Management support and education needs related to COPD ? ?RNCM Clinical Goal(s):  ?Patient will take all medications exactly as prescribed and will call provider for medication related questions ?attend all scheduled medical appointments: including PCP and specialists ?continue to work with RN Care Manager to address care management and care coordination needs related to  COPD ?will demonstrate ongoing self health care management ability for mgmt of chronic conditiond  through collaboration with RN Care manager, provider, and care team.  ? ?Interventions: ?POC sent to PCP upon initial assessment, quarterly and with any changes in patient's conditions ?Inter-disciplinary care team collaboration (see longitudinal plan of care) ?Evaluation of current treatment plan related to  self management and patient's adherence to plan as established by provider ? ? ?COPD Interventions:  (Status:  Goal on track:  Yes.) Long Term Goal ? ?Advised patient to track and manage COPD triggers;  ?Advised patient to self assesses COPD action plan zone and make appointment with provider if in the yellow zone for 48 hours without improvement; ?Assessed for immunization status-pt completed getting COVID booster, flu and PNA vaccine last month ? ?Patient Goals/Self-Care Activities: ?Patient will self administer medications as prescribed ?Patient will attend all scheduled provider appointments ?Patient will continue to perform ADL's independently ?Patient will have no exacerbation of sxs  ? ?03/23/21-Patient pleased to report how well her COPD sxs have been lately. She has not had to use prn meds  ?06/30/21-Patient reports no issues with COPD lately-sxs managed.  ?08/25/21-Patient reports she just completed  steroid dosage for flare up related to seasonal allergies-sxs improved. Breathing at baseline-using oxygen at 2L/min.  ? ?Follow Up Plan:  Telephone follow up appointment with care management team member scheduled for:  within the month of June ?The patient has been provided with contact information for the care management team and has been advised to call with any health related questions or concerns.   ?  ?  ? ?Plan: ?RN CM discussed with patient next outreach within the month of June.Patient agrees to care plan and follow up. ?RN CM will send quarterly update to PCP.  ? ?[name], RN,BSN,CCM ?[facility] Care Management ?Telephonic Care Management Coordinator ?Direct Phone: [phone] ?Toll Free: [phone] ?Fax: [phone] ? ?

## 2021-09-07 DIAGNOSIS — R748 Abnormal levels of other serum enzymes: Secondary | ICD-10-CM | POA: Diagnosis not present

## 2021-09-07 DIAGNOSIS — R6889 Other general symptoms and signs: Secondary | ICD-10-CM | POA: Diagnosis not present

## 2021-09-09 ENCOUNTER — Encounter: Payer: Self-pay | Admitting: Radiation Oncology

## 2021-09-09 NOTE — Progress Notes (Signed)
Telephone appointment. I verified patient identity and began nursing interview. Patient doing well. No issues reported at this time. ? ?Meaningful use complete. ? ?Reminded patient of her 2:00pm-09/12/21 telephone appointment w/ Shona Simpson PA-C. I left my extension [phone] in case patient needs anything. Patient verbalized understanding of information. ? ?Patient contact [phone] ?

## 2021-09-12 ENCOUNTER — Ambulatory Visit
Admission: RE | Admit: 2021-09-12 | Discharge: 2021-09-12 | Disposition: A | Discharge status: Home / Self Care | Payer: Medicare HMO | Source: Ambulatory Visit | Attending: Radiation Oncology | Admitting: Radiation Oncology

## 2021-09-12 ENCOUNTER — Telehealth: Payer: Self-pay | Admitting: Radiation Oncology

## 2021-09-12 NOTE — Telephone Encounter (Signed)
I called to confirm the patient was aware of her CT scan from April and that we had reviewed this and didn't need to have a formal appt today. She was in agreement with this and her appt will be moved out til sometime between October and November. She's encouraged to call back if she has questions or concerns prior to that visit.  ?

## 2021-09-13 ENCOUNTER — Encounter: Payer: Self-pay | Admitting: Pulmonary Disease

## 2021-09-13 ENCOUNTER — Ambulatory Visit: Payer: Medicare HMO | Admitting: Pulmonary Disease

## 2021-09-13 DIAGNOSIS — J9611 Chronic respiratory failure with hypoxia: Secondary | ICD-10-CM | POA: Diagnosis not present

## 2021-09-13 DIAGNOSIS — R6889 Other general symptoms and signs: Secondary | ICD-10-CM | POA: Diagnosis not present

## 2021-09-13 DIAGNOSIS — C3412 Malignant neoplasm of upper lobe, left bronchus or lung: Secondary | ICD-10-CM

## 2021-09-13 DIAGNOSIS — J449 Chronic obstructive pulmonary disease, unspecified: Secondary | ICD-10-CM

## 2021-09-13 NOTE — Assessment & Plan Note (Signed)
She does seem to have a component of asthma with sudden worsening of symptoms which stabilized after placing her on inhaled steroid/Trelegy.  Current flare could have been related to seasonal allergies, stayed in [place] for 6 weeks. ?At any rate she is back to her baseline, CT angiogram did not show any evidence of pulm embolism or recurrent malignancy ?We discussed COPD action plan. ?She will continue Trelegy. ?

## 2021-09-13 NOTE — Patient Instructions (Signed)
?  Continue on trelegy ? ? ?

## 2021-09-13 NOTE — Assessment & Plan Note (Signed)
Appears stable on imaging. ?She can continue 6 monthly surveillance, next CT in October 2023 ?

## 2021-09-13 NOTE — Progress Notes (Signed)
? ?  Subjective:  ? ? Patient ID: Lori Butler, female    DOB: [birth year], 71 y.o.   MRN: [id number] ? ?HPI ? ? ? 71 yo ex- smoker for FU of moderate COPD, chronic resp failure with hypoxia & lung cancer  ? She quit smoking 09/2017 ?Sudden onset and variability suggests a component of asthma ?  ?She had 2 hospitalizations 12/2017 for acute hypercarbic respiratory failure with change in mental status.  Each time she improved quickly and did not require oxygen on discharge ?  ?She underwent navigation biopsy of left upper lobe nodule showing atypical cells and then underwent SBRT 12/2018 ? ?Chief Complaint  ?Patient presents with  ? Follow-up  ?  Review CT. More tired than usual  ? ? ?Seen by APP 08/15/21 >> pred course, CTA neg PE,breztri  ?She presented with increased shortness of breath, had been to [place], long car ride, her sister passed away ? ?She feels that her breathing is now back to her baseline.  She did Breztri for 2 weeks but now is back to Trelegy.  Denies frequent use of rescue inhaler/nebs ?We reviewed CT chest, there was some concern about left apical mass thickening but this seems to have stabilized on follow-up imaging ?I have reviewed oncology and radiation oncology notes ? ?Significant tests/ events reviewed ? ?CTA chest 08/2021 unchanged left apical mass ?CT chest 07/2019 Radiation changes in the medial left upper lobe. Underlying 8 x 14 mm nodule, unchanged. ?  ?CT chest 10/14/2018 -bilateral upper lobe parenchymal scarring on her prior CT and left upper lobe scar that appeared more nodular on the CT measuring about 13 mm ?PET scan showed hypermetabolism with SUV 8 in this nodule ?  ?  ?PFTs 10/2018 showed moderate reversible airway obstruction with FEV1 of 37%-0.91 which improved to 47%-1.1L. DLCO was about 54% ?  ? ?PFTs 10/2017 -moderate airway obstruction, ratio 59, FEV1 51%, FVC 65%, no significant BD response, DLCO 57% ? ? ?Review of Systems ?neg for any significant sore throat, dysphagia,  itching, sneezing, nasal congestion or excess/ purulent secretions, fever, chills, sweats, unintended wt loss, pleuritic or exertional cp, hempoptysis, orthopnea pnd or change in chronic leg swelling. Also denies presyncope, palpitations, heartburn, abdominal pain, nausea, vomiting, diarrhea or change in bowel or urinary habits, dysuria,hematuria, rash, arthralgias, visual complaints, headache, numbness weakness or ataxia. ? ?   ?Objective:  ? Physical Exam ? ? ?Gen. Pleasant, obese, in no distress ?ENT - no lesions, no post nasal drip ?Neck: No JVD, no thyromegaly, no carotid bruits ?Lungs: no use of accessory muscles, no dullness to percussion, decreased without rales or rhonchi  ?Cardiovascular: Rhythm regular, heart sounds  normal, no murmurs or gallops, no peripheral edema ?Musculoskeletal: No deformities, no cyanosis or clubbing , no tremors ? ? ? ? ?   ?Assessment & Plan:  ? ? ?

## 2021-09-13 NOTE — Assessment & Plan Note (Signed)
She is very compliant with oxygen. ?We discussed pulmonary rehab but she finds it difficult to access the center since no transportation ?

## 2021-10-11 DIAGNOSIS — J441 Chronic obstructive pulmonary disease with (acute) exacerbation: Secondary | ICD-10-CM | POA: Diagnosis not present

## 2021-10-11 DIAGNOSIS — F322 Major depressive disorder, single episode, severe without psychotic features: Secondary | ICD-10-CM | POA: Diagnosis not present

## 2021-10-11 DIAGNOSIS — E7800 Pure hypercholesterolemia, unspecified: Secondary | ICD-10-CM | POA: Diagnosis not present

## 2021-10-18 DIAGNOSIS — D510 Vitamin B12 deficiency anemia due to intrinsic factor deficiency: Secondary | ICD-10-CM | POA: Diagnosis not present

## 2021-10-18 DIAGNOSIS — R6889 Other general symptoms and signs: Secondary | ICD-10-CM | POA: Diagnosis not present

## 2021-10-18 DIAGNOSIS — Z0000 Encounter for general adult medical examination without abnormal findings: Secondary | ICD-10-CM | POA: Diagnosis not present

## 2021-10-18 DIAGNOSIS — Z1389 Encounter for screening for other disorder: Secondary | ICD-10-CM | POA: Diagnosis not present

## 2021-10-18 DIAGNOSIS — Z79899 Other long term (current) drug therapy: Secondary | ICD-10-CM | POA: Diagnosis not present

## 2021-10-18 DIAGNOSIS — Z1211 Encounter for screening for malignant neoplasm of colon: Secondary | ICD-10-CM | POA: Diagnosis not present

## 2021-10-18 DIAGNOSIS — E7800 Pure hypercholesterolemia, unspecified: Secondary | ICD-10-CM | POA: Diagnosis not present

## 2021-10-18 DIAGNOSIS — Z23 Encounter for immunization: Secondary | ICD-10-CM | POA: Diagnosis not present

## 2021-10-18 DIAGNOSIS — E559 Vitamin D deficiency, unspecified: Secondary | ICD-10-CM | POA: Diagnosis not present

## 2021-10-19 ENCOUNTER — Other Ambulatory Visit: Payer: Self-pay | Admitting: Family Medicine

## 2021-10-19 DIAGNOSIS — Z1231 Encounter for screening mammogram for malignant neoplasm of breast: Secondary | ICD-10-CM

## 2021-10-20 ENCOUNTER — Other Ambulatory Visit: Payer: Self-pay | Admitting: *Deleted

## 2021-10-20 DIAGNOSIS — J302 Other seasonal allergic rhinitis: Secondary | ICD-10-CM

## 2021-10-20 MED ORDER — FLUTICASONE PROPIONATE 50 MCG/ACT NA SUSP: 1.0000 | Freq: Every day | NASAL | 3 refills | Status: DC

## 2021-10-20 MED ORDER — LORATADINE 10 MG PO TABS: 10.0000 mg | ORAL_TABLET | Freq: Every day | ORAL | 3 refills | Status: DC

## 2021-11-04 ENCOUNTER — Other Ambulatory Visit: Payer: Self-pay | Admitting: Family Medicine

## 2021-11-04 ENCOUNTER — Other Ambulatory Visit: Payer: Self-pay

## 2021-11-04 DIAGNOSIS — Z85118 Personal history of other malignant neoplasm of bronchus and lung: Secondary | ICD-10-CM | POA: Diagnosis not present

## 2021-11-04 DIAGNOSIS — R6889 Other general symptoms and signs: Secondary | ICD-10-CM | POA: Diagnosis not present

## 2021-11-04 DIAGNOSIS — I700 Atherosclerosis of aorta: Secondary | ICD-10-CM | POA: Diagnosis not present

## 2021-11-04 DIAGNOSIS — M810 Age-related osteoporosis without current pathological fracture: Secondary | ICD-10-CM | POA: Diagnosis not present

## 2021-11-04 DIAGNOSIS — K219 Gastro-esophageal reflux disease without esophagitis: Secondary | ICD-10-CM | POA: Diagnosis not present

## 2021-11-04 DIAGNOSIS — E7800 Pure hypercholesterolemia, unspecified: Secondary | ICD-10-CM | POA: Diagnosis not present

## 2021-11-04 DIAGNOSIS — J449 Chronic obstructive pulmonary disease, unspecified: Secondary | ICD-10-CM | POA: Diagnosis not present

## 2021-11-04 DIAGNOSIS — J302 Other seasonal allergic rhinitis: Secondary | ICD-10-CM | POA: Diagnosis not present

## 2021-11-04 DIAGNOSIS — F322 Major depressive disorder, single episode, severe without psychotic features: Secondary | ICD-10-CM | POA: Diagnosis not present

## 2021-11-04 DIAGNOSIS — Z23 Encounter for immunization: Secondary | ICD-10-CM | POA: Diagnosis not present

## 2021-11-04 NOTE — Patient Outreach (Signed)
[facility]) Care Management  11/04/2021  Lori Butler 20-Sep-1950 [id number]   Telephone Assessment    Unsuccessful outreach attempt to patient. RN CM left HIPAA compliant voicemail along with contact info.     Plan: RN CM will make outreach attempt within the month of Aug if no return call.   [name], RN,BSN,CCM [place] [facility] Telephonic Care Management Coordinator Direct Phone: [phone] Toll Free: [phone] Fax: [phone]

## 2021-12-13 DIAGNOSIS — F322 Major depressive disorder, single episode, severe without psychotic features: Secondary | ICD-10-CM | POA: Diagnosis not present

## 2021-12-13 DIAGNOSIS — E7800 Pure hypercholesterolemia, unspecified: Secondary | ICD-10-CM | POA: Diagnosis not present

## 2021-12-13 DIAGNOSIS — K219 Gastro-esophageal reflux disease without esophagitis: Secondary | ICD-10-CM | POA: Diagnosis not present

## 2021-12-13 DIAGNOSIS — M810 Age-related osteoporosis without current pathological fracture: Secondary | ICD-10-CM | POA: Diagnosis not present

## 2021-12-13 DIAGNOSIS — J449 Chronic obstructive pulmonary disease, unspecified: Secondary | ICD-10-CM | POA: Diagnosis not present

## 2021-12-29 ENCOUNTER — Other Ambulatory Visit: Payer: Self-pay

## 2021-12-29 NOTE — Patient Outreach (Signed)
[facility]) Care Management  12/29/2021  Nashira Mcglynn Pilant 02-Aug-1950 [id number]   Telephone Assessment    Successful outreach call to patient. She voices that she is doing well. Breathing managed and controlled at present. No recent flare ups. She has been avoiding outdoors due to weather and air quality. Appetite remains good. Wgt stable. No recent falls. She continues to reside in her home alone and is managing. At her recent MD appts she was told everything was stable and no new changes.   Medications Reviewed Today     Reviewed by Hayden Pedro, RN (Registered Nurse) on 12/29/21 at [phone]  Med List Status: <None>   Medication Order Taking? Sig Documenting Provider Last Dose Status Informant  albuterol (PROVENTIL) (2.5 MG/3ML) 0.083% nebulizer solution [id number] No Take 3 mLs (2.5 mg total) by nebulization every 6 (six) hours as needed for wheezing or shortness of breath. Rigoberto Noel, MD Taking Active Self  albuterol (VENTOLIN HFA) 108 (90 Base) MCG/ACT inhaler [id number] No Inhale 2 puffs into the lungs every 6 (six) hours as needed for wheezing or shortness of breath. Magdalen Spatz, NP Taking Active   alendronate (FOSAMAX) 70 MG tablet [id number] No Take 70 mg by mouth every Wednesday.  [provider] Taking Active Self           Med Note [name] Dec 24, 2017  2:52 PM)    atorvastatin (LIPITOR) 10 MG tablet [id number] No Take 10 mg by mouth daily. [provider] Taking Active Self  Cholecalciferol (VITAMIN D) 50 MCG (2000 UT) tablet [id number] No Take 2,000 Units by mouth daily. [provider] Taking Active Self  Cyanocobalamin (VITAMIN B-12) 5000 MCG TBDP [id number] No Take 1 tablet by mouth daily. [provider] Taking Active Self  Ferrous Sulfate (IRON PO) [id number] No Take 325 mg by mouth daily. [provider] Taking Active   fluticasone (FLONASE) 50 MCG/ACT nasal spray [id number]  Place  1-2 sprays into both nostrils daily. Cobb, Karie Schwalbe, NP  Active   guaiFENesin (MUCINEX) 600 MG 12 hr tablet [id number] No Take 2 tablets (1,200 mg total) by mouth 2 (two) times daily.  Patient taking differently: Take 600 mg by mouth 2 (two) times daily as needed for cough.   Martyn Ehrich, NP Taking Active   loratadine (CLARITIN) 10 MG tablet [id number]  Take 1 tablet (10 mg total) by mouth daily. Cobb, Karie Schwalbe, NP  Active   montelukast (SINGULAIR) 10 MG tablet [id number] No Take 1 tablet (10 mg total) by mouth at bedtime. Arnell Asal, NP Taking Active Self  pantoprazole (PROTONIX) 40 MG tablet [id number] No Take 1 tablet (40 mg total) by mouth 2 (two) times daily.  Patient taking differently: Take 40 mg by mouth daily.   Arnell Asal, NP Taking Active   sertraline (ZOLOFT) 50 MG tablet [id number] No Take 1 tablet (50 mg total) by mouth daily. McNew, Tyson Babinski, MD Taking Active Self  ursodiol (ACTIGALL) 250 MG tablet [id number] No Take 250 mg by mouth 2 (two) times daily. [provider] Taking Active Self           Care Plan : RN Care Manager POC  Updates made by Hayden Pedro, RN since 12/29/2021 12:00 AM     Problem: Chronic Disease Mgmt of Chronic Conditions-COPD   Priority: High     Long-Range Goal: Development of POC for Mgmt of Chronic Conditions-COPD   Start Date: 03/23/2021  Expected End Date: 03/23/2022  This Visit's Progress: On track  Recent Progress: On track  Priority: High  Note:     Current Barriers:  Chronic Disease Management support and education needs related to COPD  RNCM Clinical Goal(s):  Patient will take all medications exactly as prescribed and will call provider for medication related questions attend all scheduled medical appointments: including PCP and specialists continue to work with RN Care Manager to address care management and care coordination needs related to  COPD will demonstrate ongoing self health  care management ability for mgmt of chronic conditiond  through collaboration with RN Care manager, provider, and care team.   Interventions: POC sent to PCP upon initial assessment, quarterly and with any changes in patient's conditions Inter-disciplinary care team collaboration (see longitudinal plan of care) Evaluation of current treatment plan related to  self management and patient's adherence to plan as established by provider   COPD Interventions:  (Status:  Goal on track:  Yes.) Long Term Goal  Advised patient to track and manage COPD triggers;  Advised patient to self assesses COPD action plan zone and make appointment with provider if in the yellow zone for 48 hours without improvement; Assessed for immunization status-pt completed getting COVID booster, flu and PNA vaccine last month  Patient Goals/Self-Care Activities: Patient will self administer medications as prescribed Patient will attend all scheduled provider appointments Patient will continue to perform ADL's independently Patient will have no exacerbation of sxs   03/23/21-Patient pleased to report how well her COPD sxs have been lately. She has not had to use prn meds  06/30/21-Patient reports no issues with COPD lately-sxs managed.  08/25/21-Patient reports she just completed steroid dosage for flare up related to seasonal allergies-sxs improved. Breathing at baseline-using oxygen at 2L/min.  12/29/21-Breathing and COP remains stable. No recent flare ups.  Follow Up Plan:  Telephone follow up appointment with care management team member scheduled for:  within the month of Nov The patient has been provided with contact information for the care management team and has been advised to call with any health related questions or concerns.        Plan: RN CM discussed with patient next outreach within the month of Nov. Patient agrees to care plan and follow up.  [name], RN,BSN,CCM [place]  Management Telephonic Care Management Coordinator Direct Phone: [phone] Toll Free: [phone] Fax: [phone]

## 2022-01-04 ENCOUNTER — Other Ambulatory Visit: Payer: Self-pay

## 2022-01-04 NOTE — Patient Outreach (Signed)
[facility]) Care Management  01/04/2022  Lori Butler 11-14-1950 [id number]   Case Closure    Case is being transferred. Assigned RN CM will outreach and follow up with patient.     [name], RN,BSN,CCM [place] Management Telephonic Care Management Coordinator Direct Phone: [phone] Toll Free: [phone] Fax: [phone]

## 2022-01-23 ENCOUNTER — Telehealth: Payer: Self-pay | Admitting: *Deleted

## 2022-01-23 NOTE — Telephone Encounter (Signed)
CALLED PATIENT TO INFORM OF CT FOR 02-06-22- ARRIVAL TIME- 1 PM @ [facility], PATIENT TO HAVE WATER ONLY- 4 HRS. PRIOR TO TEST, PATIENT TO RECEIVE RESULTS FROM ALISON PERKINS ON 02-07-22 @ 8:30 AM VIA TELEPHONE, SPOKE WITH PATIENT AND SHE IS AWARE OF THESE APPTS. AND THE INSTRUCTIONS

## 2022-02-06 ENCOUNTER — Encounter (HOSPITAL_COMMUNITY): Payer: Self-pay

## 2022-02-06 ENCOUNTER — Telehealth: Payer: Self-pay

## 2022-02-06 ENCOUNTER — Ambulatory Visit (HOSPITAL_COMMUNITY)
Admission: RE | Admit: 2022-02-06 | Discharge: 2022-02-06 | Disposition: A | Discharge status: Home / Self Care | Payer: Medicare HMO | Source: Ambulatory Visit | Attending: Radiation Oncology | Admitting: Radiation Oncology

## 2022-02-06 DIAGNOSIS — R6889 Other general symptoms and signs: Secondary | ICD-10-CM | POA: Diagnosis not present

## 2022-02-06 DIAGNOSIS — C3412 Malignant neoplasm of upper lobe, left bronchus or lung: Secondary | ICD-10-CM | POA: Diagnosis not present

## 2022-02-06 DIAGNOSIS — J439 Emphysema, unspecified: Secondary | ICD-10-CM | POA: Diagnosis not present

## 2022-02-06 DIAGNOSIS — J984 Other disorders of lung: Secondary | ICD-10-CM | POA: Diagnosis not present

## 2022-02-06 LAB — POCT I-STAT CREATININE: Creatinine, Ser: 0.7 mg/dL (ref 0.44–1.00)

## 2022-02-06 MED ORDER — SODIUM CHLORIDE (PF) 0.9 % IJ SOLN
INTRAMUSCULAR | Status: AC
  Filled 2022-02-06: qty 50

## 2022-02-06 MED ORDER — IOHEXOL 300 MG/ML  SOLN
75.0000 mL | Freq: Once | INTRAMUSCULAR | Status: AC | PRN
  Administered 2022-02-06: 75 mL via INTRAVENOUS

## 2022-02-06 NOTE — Progress Notes (Signed)
Radiation Oncology         [phone] ________________________________  Outpatient Follow Up - Conducted via telephone at patient request.  I spoke with the patient to conduct this consult visit via telephone. The patient was notified in advance and was offered an in person or telemedicine meeting to allow for face to face communication but instead preferred to proceed with a telephone visit.    Name: Lori Butler        MRN: [id number]  Date of Service: 02/07/2022 DOB: 09-24-1950  LE:XNTZGY, Lori Haw, MD  [name], MD     REFERRING PHYSICIAN: [name], MD   DIAGNOSIS: The encounter diagnosis was Malignant neoplasm of upper lobe of left lung [facility]).   HISTORY OF PRESENT ILLNESS: Lori Butler is a 71 y.o. female with a history of  Putative Stage IA2, cT1bN0M0 NSCLC of the LUL. She has oxygen dependant (2L [place]) COPD and Asthma and was sent for lung cancer screening scan on 10/14/2018 which revealed a 1.26 cm lesion in the left upper lobe, as well as a 3.6 mm lesion in the left lower lobe. No adenopathy was noted. She has a low attenuation lesion in the liver measuring 1.3 cm  And this was felt to be stable in comparison to prior studies. She underwent a PET scan on 10/18/2018 that revealed an SUV of 8.4 in the left upper lobe lesion that was seen on CT and measured 1.3 cm on PET. She did not have additional hypermetabolic changes in the other nodule or in thoracic nodes. The liver did not show metabolic change and was favored as a cyst. She did undergo bronchoscopy on 11/11/2018 which revealed atypical cells in her biopsy and cytology specimens without definitive carcinoma. She decided against surgical resection and proceeded with SBRT in the summer of 2020. She has been followed in surveillance since and has been without recurrent disease.   Her most recent CT on 02/06/22 which was a shorter interval follow up due to increased nodular change. This scan showed stable post radiation  changes in the LUL. She continues to have stigmata of atherosclerosis of the aorta, emphysema, and a stable cystic structure in the left lobe of the liver. She's contacted today by phone to review her results.   PREVIOUS RADIATION THERAPY:    12/03/2018-12/10/2018 SBRT Treatment: The patient was treated to the LUL lung with a course of stereotactic body radiation treatment.  The patient received 54 Gray in 3 fractions using a IMRT technique, with 3 fields.   PAST MEDICAL HISTORY:  Past Medical History:  Diagnosis Date   Acute respiratory failure with hypoxia (HCC)    Anxiety    Aortic atherosclerosis ([place])    Arthritis    "a little in my hands probably" (10/31/2017)   Asthma    COPD (chronic obstructive pulmonary disease) (HCC)    COPD exacerbation (HCC)    Depression    History of kidney stones    lung ca dx'd 10/2018   Suicide attempt ([place]) 02/21/2018   overdosed on Wellbutrin       PAST SURGICAL HISTORY: Past Surgical History:  Procedure Laterality Date   ABDOMINAL HYSTERECTOMY     APPENDECTOMY     BREAST BIOPSY Left 1990s   CATARACT EXTRACTION W/ INTRAOCULAR LENS  IMPLANT, BILATERAL Bilateral    DILATION AND CURETTAGE OF UTERUS     FUDUCIAL PLACEMENT N/A 11/11/2018   Procedure: PLACEMENT OF FUDUCIAL;  Surgeon: Melrose Nakayama, MD;  Location: [facility];  Service: Thoracic;  Laterality: N/A;   TONSILLECTOMY     TUBAL LIGATION     VIDEO BRONCHOSCOPY WITH ENDOBRONCHIAL NAVIGATION N/A 11/11/2018   Procedure: VIDEO BRONCHOSCOPY WITH ENDOBRONCHIAL NAVIGATION;  Surgeon: Melrose Nakayama, MD;  Location: [facility] OR;  Service: Thoracic;  Laterality: N/A;     FAMILY HISTORY:  Family History  Problem Relation Age of Onset   Breast cancer Mother      SOCIAL HISTORY:  reports that she quit smoking about 4 years ago. Her smoking use included cigarettes. She has a 76.50 pack-year smoking history. She has never used smokeless tobacco. She reports that she does not currently use  alcohol. She reports that she does not use drugs. The patient is divorced and lives in [place].     ALLERGIES: Demerol [meperidine], Oxycodone-acetaminophen, and Tramadol   MEDICATIONS:  Current Outpatient Medications  Medication Sig Dispense Refill   albuterol (PROVENTIL) (2.5 MG/3ML) 0.083% nebulizer solution Take 3 mLs (2.5 mg total) by nebulization every 6 (six) hours as needed for wheezing or shortness of breath. 75 mL 6   albuterol (VENTOLIN HFA) 108 (90 Base) MCG/ACT inhaler Inhale 2 puffs into the lungs every 6 (six) hours as needed for wheezing or shortness of breath. 1 each 2   alendronate (FOSAMAX) 70 MG tablet Take 70 mg by mouth every Wednesday.      atorvastatin (LIPITOR) 10 MG tablet Take 10 mg by mouth daily.     Cholecalciferol (VITAMIN D) 50 MCG (2000 UT) tablet Take 2,000 Units by mouth daily.     Cyanocobalamin (VITAMIN B-12) 5000 MCG TBDP Take 1 tablet by mouth daily.     Ferrous Sulfate (IRON PO) Take 325 mg by mouth daily.     fluticasone (FLONASE) 50 MCG/ACT nasal spray Place 1-2 sprays into both nostrils daily. 48 g 3   guaiFENesin (MUCINEX) 600 MG 12 hr tablet Take 2 tablets (1,200 mg total) by mouth 2 (two) times daily. (Patient taking differently: Take 600 mg by mouth 2 (two) times daily as needed for cough.) 30 tablet 0   loratadine (CLARITIN) 10 MG tablet Take 1 tablet (10 mg total) by mouth daily. 90 tablet 3   montelukast (SINGULAIR) 10 MG tablet Take 1 tablet (10 mg total) by mouth at bedtime. 30 tablet 1   pantoprazole (PROTONIX) 40 MG tablet Take 1 tablet (40 mg total) by mouth 2 (two) times daily. (Patient taking differently: Take 40 mg by mouth daily.) 60 tablet 1   sertraline (ZOLOFT) 50 MG tablet Take 1 tablet (50 mg total) by mouth daily. 30 tablet 0   ursodiol (ACTIGALL) 250 MG tablet Take 250 mg by mouth 2 (two) times daily.     No current facility-administered medications for this visit.     REVIEW OF SYSTEMS: On review of systems, the patient  reports that she is doing well. She continues to have shortness of breath with exertion, and O2 demands remain stable at 2L [place] at home. She continues to note shortness of breath is worst  in the heat and humidty of the summer when she's more short winded. No complaints of  chest pain, shortness of breath, cough are verbalized.      PHYSICAL EXAM:  Unable to assess due to encounter type.    ECOG = 1  0 - Asymptomatic (Fully active, able to carry on all predisease activities without restriction)  1 - Symptomatic but completely ambulatory (Restricted in physically strenuous activity but ambulatory and able to carry out work of  a light or sedentary nature. For example, light housework, office work)  2 - Symptomatic, <50% in bed during the day (Ambulatory and capable of all self care but unable to carry out any work activities. Up and about more than 50% of waking hours)  3 - Symptomatic, >50% in bed, but not bedbound (Capable of only limited self-care, confined to bed or chair 50% or more of waking hours)  4 - Bedbound (Completely disabled. Cannot carry on any self-care. Totally confined to bed or chair)  5 - Death   Eustace Pen MM, Creech RH, Tormey DC, et al. [phone]). "Toxicity and response criteria of the [facility]". [place] Oncol. 5 (6): 649-55    LABORATORY DATA:  Lab Results  Component Value Date   WBC 5.4 02/08/2020   HGB 11.4 (L) 02/08/2020   HCT 35.8 (L) 02/08/2020   MCV 88.2 02/08/2020   PLT 233 02/08/2020   Lab Results  Component Value Date   NA 141 08/15/2021   K 4.0 08/15/2021   CL 104 08/15/2021   CO2 28 08/15/2021   Lab Results  Component Value Date   ALT 22 02/08/2020   AST 26 02/08/2020   ALKPHOS 255 (H) 02/08/2020   BILITOT 0.7 02/08/2020      RADIOGRAPHY: No results found.      IMPRESSION/PLAN: 1. Putative Stage IA2, cT1bN0M0 NSCLC of the LUL. Her imaging remains stable from yesterday's scan and we will plan to follow up  with her in 6 month's time per NCCN guidelines. She is in agreement and will notify me sooner if she has questions or concerns.   2. LLL nodule. Again this has resolved with serial imaging but we will follow up on subsequent visits to make sure no new or suspicious nodules need to be followed or treated. 3. COPD/Asthma. The patient continues to follow up with pulmonary medicine, as she uses oxygen at 2L [place]. We appreciate their help in continuing to maximize her lung function.     This encounter was conducted via telephone.  The patient has provided two factor identification and has given verbal consent for this type of encounter and has been advised to only accept a meeting of this type in a secure network environment. The time spent during this encounter was 35 minutes including preparation, discussion, and coordination of the patient's care. The attendants for this meeting include Hayden Pedro  and Eugenio Hoes [place].  During the encounter, Hayden Pedro was located at [facility] Radiation Oncology Department.  Ariadne Rissmiller [place]on was located at home.     Carola Rhine, PAC

## 2022-02-06 NOTE — Telephone Encounter (Signed)
Call placed to patient to make aware that PA-C will call her on 02/07/22 for follow up once CT results are final. Patient voiced understanding.

## 2022-02-07 ENCOUNTER — Encounter: Payer: Self-pay | Admitting: Radiation Oncology

## 2022-02-07 ENCOUNTER — Ambulatory Visit
Admission: RE | Admit: 2022-02-07 | Discharge: 2022-02-07 | Disposition: A | Discharge status: Home / Self Care | Payer: Medicare HMO | Source: Ambulatory Visit | Attending: Radiation Oncology | Admitting: Radiation Oncology

## 2022-02-07 DIAGNOSIS — Z87891 Personal history of nicotine dependence: Secondary | ICD-10-CM | POA: Diagnosis not present

## 2022-02-07 DIAGNOSIS — C3412 Malignant neoplasm of upper lobe, left bronchus or lung: Secondary | ICD-10-CM | POA: Diagnosis not present

## 2022-02-07 NOTE — Progress Notes (Signed)
Telephone appointment. I verified patient's identity and began nursing interview. Patient reports occasional, mild SOB w/ exertion. No other issues reported at this time.  Meaningful use complete.  Patient aware of her 8:30am-02/07/22 telephone appointment w/ Shona Simpson PA-C. I left my extension [phone] in case patient needs anything. Patient verbalized understanding.  Patient contact [phone]

## 2022-02-22 DIAGNOSIS — H5203 Hypermetropia, bilateral: Secondary | ICD-10-CM | POA: Diagnosis not present

## 2022-02-22 DIAGNOSIS — H52209 Unspecified astigmatism, unspecified eye: Secondary | ICD-10-CM | POA: Diagnosis not present

## 2022-02-22 DIAGNOSIS — H524 Presbyopia: Secondary | ICD-10-CM | POA: Diagnosis not present

## 2022-02-22 DIAGNOSIS — H5213 Myopia, bilateral: Secondary | ICD-10-CM | POA: Diagnosis not present

## 2022-03-20 ENCOUNTER — Inpatient Hospital Stay: Admission: RE | Admit: 2022-03-20 | Payer: Medicare HMO | Source: Ambulatory Visit | Admitting: Radiation Oncology

## 2022-03-21 ENCOUNTER — Ambulatory Visit: Payer: Self-pay

## 2022-03-21 NOTE — Patient Instructions (Signed)
Visit Information  Thank you for taking time to visit with me today. Please don't hesitate to contact me if I can be of assistance to you.   Following are the goals we discussed today:   Goals Addressed             This Visit's Progress    COMPLETED: Care Coordination Activities - no follow up required       Care Coordination Interventions: Evaluation of current treatment plan related to COPD and patient's adherence to plan as established by provider Provided education to patient re: care coordination services Screening for signs and symptoms of depression related to chronic disease state  Assessed social determinant of health barriers Advised patient to track and manage COPD triggers Provided instruction about proper use of medications used for management of COPD including inhalers Advised patient to self assesses COPD action plan zone and make appointment with provider if in the yellow zone for 48 hours without improvement Discussed the importance of adequate rest and management of fatigue with COPD Pt verbalizes good teach back of COPD zones and self management of COPD No further needs noted but pt has contact information for care management if needs change in future          If you are experiencing a Mental Health or [place] or need someone to talk to, please call the Suicide and Crisis Lifeline: 988 call the [place] National Suicide Prevention Lifeline: 930-215-8831 or TTY: 734-561-7574 TTY 984 669 1614) to talk to a trained counselor call 1-800-273-TALK (toll free, 24 hour hotline) go to [facility] [address], [place] [phone]) call 911   Patient verbalizes understanding of instructions and care plan provided today and agrees to view in [place]. Active MyChart status and patient understanding of how to access instructions and care plan via MyChart confirmed with patient.     No further follow up required:     Peter Garter RN, Jackquline Denmark, [place] [facility] [phone]

## 2022-03-21 NOTE — Patient Outreach (Signed)
  Care Coordination   Follow Up Visit Note   03/21/2022 Name: Lori Butler MRN: [id number] DOB: 1950-10-23  Lori Butler is a 71 y.o. year old female who sees [name], MD for primary care. I spoke with  Eugenio Hoes [place] by phone today.  What matters to the patients health and wellness today?  My breathing has been good. States she gets short of breath with exertion but is coping well.  States her sputum is clear and knows when to call provider for any changes in breathing.  States her mood has been good.  No concerns today    Goals Addressed             This Visit's Progress    COMPLETED: Care Coordination Activities - no follow up required       Care Coordination Interventions: Evaluation of current treatment plan related to COPD and patient's adherence to plan as established by provider Provided education to patient re: care coordination services Screening for signs and symptoms of depression related to chronic disease state  Assessed social determinant of health barriers Advised patient to track and manage COPD triggers Provided instruction about proper use of medications used for management of COPD including inhalers Advised patient to self assesses COPD action plan zone and make appointment with provider if in the yellow zone for 48 hours without improvement Discussed the importance of adequate rest and management of fatigue with COPD Pt verbalizes good teach back of COPD zones and self management of COPD No further needs noted but pt has contact information for care management if needs change in future          SDOH assessments and interventions completed:  Yes  SDOH Interventions Today    Flowsheet Row Most Recent Value  SDOH Interventions   Food Insecurity Interventions Intervention Not Indicated  Housing Interventions Intervention Not Indicated  Transportation Interventions Intervention Not Indicated  Utilities Interventions Intervention Not  Indicated        Care Coordination Interventions Activated:  Yes  Care Coordination Interventions:  Yes, provided   Follow up plan: No further intervention required.   Encounter Outcome:  Pt. Visit Completed  Peter Garter RN, BSN,CCM, CDE Care Management Coordinator [place] [facility] [phone]

## 2022-03-29 ENCOUNTER — Other Ambulatory Visit: Payer: Self-pay | Admitting: Pulmonary Disease

## 2022-04-04 ENCOUNTER — Telehealth: Payer: Self-pay | Admitting: Pulmonary Disease

## 2022-04-04 NOTE — Telephone Encounter (Signed)
Rx for pt's Trelegy was sent to [facility] for her 11/22. Called and spoke with pt letting her know this had been done and stated to her that if there were still issues with her receiving this from [facility] to call us back and we could resubmit the Rx if needed. Pt verbalized understanding. Nothing further needed.

## 2022-04-11 ENCOUNTER — Encounter (HOSPITAL_BASED_OUTPATIENT_CLINIC_OR_DEPARTMENT_OTHER): Payer: Self-pay | Admitting: Pulmonary Disease

## 2022-04-11 ENCOUNTER — Other Ambulatory Visit (HOSPITAL_BASED_OUTPATIENT_CLINIC_OR_DEPARTMENT_OTHER): Payer: Self-pay

## 2022-04-11 ENCOUNTER — Ambulatory Visit (INDEPENDENT_AMBULATORY_CARE_PROVIDER_SITE_OTHER): Payer: Medicare HMO | Admitting: Pulmonary Disease

## 2022-04-11 VITALS — BP 112/68 | HR 95 | Temp 98.4°F | Ht 65.0 in | Wt 178.0 lb

## 2022-04-11 DIAGNOSIS — J449 Chronic obstructive pulmonary disease, unspecified: Secondary | ICD-10-CM | POA: Diagnosis not present

## 2022-04-11 DIAGNOSIS — J9611 Chronic respiratory failure with hypoxia: Secondary | ICD-10-CM | POA: Diagnosis not present

## 2022-04-11 DIAGNOSIS — Z23 Encounter for immunization: Secondary | ICD-10-CM

## 2022-04-11 DIAGNOSIS — J4489 Other specified chronic obstructive pulmonary disease: Secondary | ICD-10-CM

## 2022-04-11 DIAGNOSIS — J4540 Moderate persistent asthma, uncomplicated: Secondary | ICD-10-CM | POA: Diagnosis not present

## 2022-04-11 DIAGNOSIS — R6889 Other general symptoms and signs: Secondary | ICD-10-CM | POA: Diagnosis not present

## 2022-04-11 MED ORDER — TRELEGY ELLIPTA 200-62.5-25 MCG/ACT IN AEPB: INHALATION_SPRAY | RESPIRATORY_TRACT | 3 refills | Status: DC

## 2022-04-11 NOTE — Progress Notes (Signed)
   Subjective:    Patient ID: Lori Butler, female    DOB: [birth year], 71 y.o.   MRN: [id number]  HPI  71  yo ex- smoker for FU of moderate COPD/ asthma, chronic resp failure with hypoxia & lung cancer   She quit smoking 09/2017 Sudden onset and variability suggests a component of asthma   She had 2 hospitalizations 12/2017 for acute hypercarbic respiratory failure with change in mental status.  Each time she improved quickly and did not require oxygen on discharge   She underwent navigation biopsy of left upper lobe nodule showing atypical cells and then underwent SBRT 12/2018  Chief Complaint  Patient presents with   Follow-up    Pt states no new issues since LOV. Pt told therapist for lung exercises that she did not want to do it.    50-month follow-up visit. She is compliant with Trelegy and this has worked well for her.  She is compliant with oxygen. She rarely needs albuterol. She tries to stay active, complains of shortness of breath with activities around the house. She is compliant with Claritin and Singulair  Significant tests/ events reviewed  CTA chest 08/2021 unchanged left apical mass like fibrosis CT chest 07/2019 Radiation changes in the medial left upper lobe. Underlying 8 x 14 mm nodule, unchanged.   CT chest 10/14/2018 -bilateral upper lobe parenchymal scarring on her prior CT and left upper lobe scar that appeared more nodular on the CT measuring about 13 mm PET scan showed hypermetabolism with SUV 8 in this nodule     PFTs 10/2018 showed moderate reversible airway obstruction with FEV1 of 37%-0.91 which improved to 47%-1.1L. DLCO was about 54%     PFTs 10/2017 -moderate airway obstruction, ratio 59, FEV1 51%, FVC 65%, no significant BD response, DLCO 57%  Review of Systems neg for any significant sore throat, dysphagia, itching, sneezing, nasal congestion or excess/ purulent secretions, fever, chills, sweats, unintended wt loss, pleuritic or exertional cp,  hempoptysis, orthopnea pnd or change in chronic leg swelling. Also denies presyncope, palpitations, heartburn, abdominal pain, nausea, vomiting, diarrhea or change in bowel or urinary habits, dysuria,hematuria, rash, arthralgias, visual complaints, headache, numbness weakness or ataxia.     Objective:   Physical Exam  Gen. Pleasant, obese, in no distress ENT - no lesions, no post nasal drip Neck: No JVD, no thyromegaly, no carotid bruits Lungs: no use of accessory muscles, no dullness to percussion, decreased without rales or rhonchi  Cardiovascular: Rhythm regular, heart sounds  normal, no murmurs or gallops, no peripheral edema Musculoskeletal: No deformities, no cyanosis or clubbing , no tremors       Assessment & Plan:

## 2022-04-11 NOTE — Assessment & Plan Note (Signed)
Continue oxygen 24/7

## 2022-04-11 NOTE — Assessment & Plan Note (Addendum)
She definitely has a component of asthma and Trelegy seems to have worked well for her. Exacerbations have decreased. Refills will be provided  We discussed COPD action plan and signs and symptoms of COPD exacerbation

## 2022-04-11 NOTE — Patient Instructions (Signed)
X Refills on Trelegy.  X Flu shot today.  RSV and COVID booster recommended

## 2022-04-13 DIAGNOSIS — K219 Gastro-esophageal reflux disease without esophagitis: Secondary | ICD-10-CM | POA: Diagnosis not present

## 2022-04-13 DIAGNOSIS — E7800 Pure hypercholesterolemia, unspecified: Secondary | ICD-10-CM | POA: Diagnosis not present

## 2022-04-13 DIAGNOSIS — J449 Chronic obstructive pulmonary disease, unspecified: Secondary | ICD-10-CM | POA: Diagnosis not present

## 2022-04-13 DIAGNOSIS — F322 Major depressive disorder, single episode, severe without psychotic features: Secondary | ICD-10-CM | POA: Diagnosis not present

## 2022-04-13 DIAGNOSIS — M810 Age-related osteoporosis without current pathological fracture: Secondary | ICD-10-CM | POA: Diagnosis not present

## 2022-04-26 ENCOUNTER — Other Ambulatory Visit: Payer: Medicare HMO

## 2022-04-26 ENCOUNTER — Ambulatory Visit: Payer: Medicare HMO

## 2022-05-12 ENCOUNTER — Other Ambulatory Visit: Payer: Self-pay | Admitting: Family Medicine

## 2022-05-12 DIAGNOSIS — Z1231 Encounter for screening mammogram for malignant neoplasm of breast: Secondary | ICD-10-CM

## 2022-06-26 ENCOUNTER — Telehealth (HOSPITAL_BASED_OUTPATIENT_CLINIC_OR_DEPARTMENT_OTHER): Payer: Self-pay | Admitting: Pulmonary Disease

## 2022-06-26 MED ORDER — PREDNISONE 10 MG PO TABS: ORAL_TABLET | ORAL | 0 refills | Status: DC

## 2022-06-26 NOTE — Telephone Encounter (Signed)
Called and spoke with patient. She verbalized understanding. RX has been sent to pharmacy.   Nothing further needed at time of call.

## 2022-06-26 NOTE — Telephone Encounter (Signed)
pt. calling is sick and wanted to see if Dr. Elsworth Soho would call in steroid to pharmacy for her

## 2022-06-26 NOTE — Telephone Encounter (Signed)
I called and spoke with the pt  She is c/o increased SOB, minimal wheezing and cough for the past 3 days  Her cough is occ prod with white to clear sputum  She is taking mucinex  She is using her trelegy every day and neb with albuterol 2-3 x per day without relief She denies fevers, aches  Please advise, thanks!  Allergies  Allergen Reactions   Demerol [Meperidine] Other (See Comments)    "horrible reaction"   Oxycodone-Acetaminophen Other (See Comments)    Upset stomach    Tramadol Nausea And Vomiting

## 2022-06-30 ENCOUNTER — Encounter (HOSPITAL_COMMUNITY): Payer: Self-pay | Admitting: Internal Medicine

## 2022-06-30 ENCOUNTER — Inpatient Hospital Stay (HOSPITAL_COMMUNITY)
Admission: EM | Admit: 2022-06-30 | Discharge: 2022-07-02 | DRG: 190 | Disposition: A | Discharge status: Home / Self Care | Payer: Medicare HMO | Attending: Internal Medicine | Admitting: Internal Medicine

## 2022-06-30 ENCOUNTER — Other Ambulatory Visit: Payer: Self-pay

## 2022-06-30 ENCOUNTER — Emergency Department (HOSPITAL_COMMUNITY): Payer: Medicare HMO

## 2022-06-30 DIAGNOSIS — J45901 Unspecified asthma with (acute) exacerbation: Secondary | ICD-10-CM | POA: Diagnosis present

## 2022-06-30 DIAGNOSIS — R509 Fever, unspecified: Secondary | ICD-10-CM | POA: Diagnosis not present

## 2022-06-30 DIAGNOSIS — J9620 Acute and chronic respiratory failure, unspecified whether with hypoxia or hypercapnia: Secondary | ICD-10-CM | POA: Diagnosis present

## 2022-06-30 DIAGNOSIS — E876 Hypokalemia: Secondary | ICD-10-CM | POA: Diagnosis not present

## 2022-06-30 DIAGNOSIS — J189 Pneumonia, unspecified organism: Secondary | ICD-10-CM | POA: Diagnosis present

## 2022-06-30 DIAGNOSIS — F32A Depression, unspecified: Secondary | ICD-10-CM | POA: Diagnosis not present

## 2022-06-30 DIAGNOSIS — I10 Essential (primary) hypertension: Secondary | ICD-10-CM | POA: Diagnosis not present

## 2022-06-30 DIAGNOSIS — Z85118 Personal history of other malignant neoplasm of bronchus and lung: Secondary | ICD-10-CM | POA: Diagnosis not present

## 2022-06-30 DIAGNOSIS — Z8616 Personal history of COVID-19: Secondary | ICD-10-CM

## 2022-06-30 DIAGNOSIS — F419 Anxiety disorder, unspecified: Secondary | ICD-10-CM | POA: Diagnosis not present

## 2022-06-30 DIAGNOSIS — Z885 Allergy status to narcotic agent status: Secondary | ICD-10-CM | POA: Diagnosis not present

## 2022-06-30 DIAGNOSIS — E872 Acidosis, unspecified: Secondary | ICD-10-CM | POA: Diagnosis not present

## 2022-06-30 DIAGNOSIS — Z9981 Dependence on supplemental oxygen: Secondary | ICD-10-CM | POA: Diagnosis not present

## 2022-06-30 DIAGNOSIS — J302 Other seasonal allergic rhinitis: Secondary | ICD-10-CM | POA: Diagnosis present

## 2022-06-30 DIAGNOSIS — Z803 Family history of malignant neoplasm of breast: Secondary | ICD-10-CM | POA: Diagnosis not present

## 2022-06-30 DIAGNOSIS — J9621 Acute and chronic respiratory failure with hypoxia: Secondary | ICD-10-CM | POA: Diagnosis present

## 2022-06-30 DIAGNOSIS — R0602 Shortness of breath: Secondary | ICD-10-CM | POA: Diagnosis not present

## 2022-06-30 DIAGNOSIS — Z1152 Encounter for screening for COVID-19: Secondary | ICD-10-CM | POA: Diagnosis not present

## 2022-06-30 DIAGNOSIS — Z87891 Personal history of nicotine dependence: Secondary | ICD-10-CM | POA: Diagnosis not present

## 2022-06-30 DIAGNOSIS — Z79899 Other long term (current) drug therapy: Secondary | ICD-10-CM

## 2022-06-30 DIAGNOSIS — J441 Chronic obstructive pulmonary disease with (acute) exacerbation: Secondary | ICD-10-CM | POA: Diagnosis not present

## 2022-06-30 DIAGNOSIS — Z7983 Long term (current) use of bisphosphonates: Secondary | ICD-10-CM | POA: Diagnosis not present

## 2022-06-30 DIAGNOSIS — Z9151 Personal history of suicidal behavior: Secondary | ICD-10-CM

## 2022-06-30 DIAGNOSIS — R069 Unspecified abnormalities of breathing: Secondary | ICD-10-CM | POA: Diagnosis not present

## 2022-06-30 DIAGNOSIS — K219 Gastro-esophageal reflux disease without esophagitis: Secondary | ICD-10-CM | POA: Diagnosis not present

## 2022-06-30 DIAGNOSIS — Z7951 Long term (current) use of inhaled steroids: Secondary | ICD-10-CM | POA: Diagnosis not present

## 2022-06-30 DIAGNOSIS — R000 Tachycardia, unspecified: Secondary | ICD-10-CM | POA: Diagnosis not present

## 2022-06-30 LAB — CBC WITH DIFFERENTIAL/PLATELET
Abs Immature Granulocytes: 0.09 10*3/uL — ABNORMAL HIGH (ref 0.00–0.07)
Basophils Absolute: 0 10*3/uL (ref 0.0–0.1)
Basophils Relative: 0 %
Eosinophils Absolute: 0 10*3/uL (ref 0.0–0.5)
Eosinophils Relative: 0 %
HCT: 37 % (ref 36.0–46.0)
Hemoglobin: 11.9 g/dL — ABNORMAL LOW (ref 12.0–15.0)
Immature Granulocytes: 1 %
Lymphocytes Relative: 13 %
Lymphs Abs: 1.2 10*3/uL (ref 0.7–4.0)
MCH: 29.2 pg (ref 26.0–34.0)
MCHC: 32.2 g/dL (ref 30.0–36.0)
MCV: 90.9 fL (ref 80.0–100.0)
Monocytes Absolute: 0.1 10*3/uL (ref 0.1–1.0)
Monocytes Relative: 2 %
Neutro Abs: 7.6 10*3/uL (ref 1.7–7.7)
Neutrophils Relative %: 84 %
Platelets: 287 10*3/uL (ref 150–400)
RBC: 4.07 MIL/uL (ref 3.87–5.11)
RDW: 13.2 % (ref 11.5–15.5)
WBC: 9 10*3/uL (ref 4.0–10.5)
nRBC: 0 % (ref 0.0–0.2)

## 2022-06-30 LAB — LACTIC ACID, PLASMA
Lactic Acid, Venous: 2.8 mmol/L (ref 0.5–1.9)
Lactic Acid, Venous: 3.1 mmol/L (ref 0.5–1.9)
Lactic Acid, Venous: 1.9 mmol/L (ref 0.5–1.9)

## 2022-06-30 LAB — COMPREHENSIVE METABOLIC PANEL
ALT: 15 U/L (ref 0–44)
AST: 24 U/L (ref 15–41)
Albumin: 2.9 g/dL — ABNORMAL LOW (ref 3.5–5.0)
Alkaline Phosphatase: 82 U/L (ref 38–126)
Anion gap: 9 (ref 5–15)
BUN: 16 mg/dL (ref 8–23)
CO2: 21 mmol/L — ABNORMAL LOW (ref 22–32)
Calcium: 7.2 mg/dL — ABNORMAL LOW (ref 8.9–10.3)
Chloride: 112 mmol/L — ABNORMAL HIGH (ref 98–111)
Creatinine, Ser: 0.7 mg/dL (ref 0.44–1.00)
GFR, Estimated: >60 mL/min (ref >60)
Glucose, Bld: 88 mg/dL (ref 70–99)
Potassium: 3.5 mmol/L (ref 3.5–5.1)
Sodium: 142 mmol/L (ref 135–145)
Total Bilirubin: 0.4 mg/dL (ref 0.3–1.2)
Total Protein: 5.4 g/dL — ABNORMAL LOW (ref 6.5–8.1)

## 2022-06-30 LAB — URINALYSIS, ROUTINE W REFLEX MICROSCOPIC
Bilirubin Urine: NEGATIVE
Glucose, UA: NEGATIVE mg/dL
Hgb urine dipstick: NEGATIVE
Ketones, ur: NEGATIVE mg/dL
Leukocytes,Ua: NEGATIVE
Nitrite: NEGATIVE
Protein, ur: NEGATIVE mg/dL
Specific Gravity, Urine: 1.009 (ref 1.005–1.030)
pH: 5 (ref 5.0–8.0)

## 2022-06-30 LAB — RESP PANEL BY RT-PCR (RSV, FLU A&B, COVID)  RVPGX2
Influenza A by PCR: NEGATIVE
Influenza B by PCR: NEGATIVE
Resp Syncytial Virus by PCR: NEGATIVE
SARS Coronavirus 2 by RT PCR: NEGATIVE

## 2022-06-30 LAB — GLUCOSE, CAPILLARY: Glucose-Capillary: 122 mg/dL — ABNORMAL HIGH (ref 70–99)

## 2022-06-30 LAB — BASIC METABOLIC PANEL
Anion gap: 10 (ref 5–15)
BUN: 20 mg/dL (ref 8–23)
CO2: 21 mmol/L — ABNORMAL LOW (ref 22–32)
Calcium: 8.4 mg/dL — ABNORMAL LOW (ref 8.9–10.3)
Chloride: 109 mmol/L (ref 98–111)
Creatinine, Ser: 0.88 mg/dL (ref 0.44–1.00)
GFR, Estimated: >60 mL/min (ref >60)
Glucose, Bld: 200 mg/dL — ABNORMAL HIGH (ref 70–99)
Potassium: 3.2 mmol/L — ABNORMAL LOW (ref 3.5–5.1)
Sodium: 140 mmol/L (ref 135–145)

## 2022-06-30 LAB — PHOSPHORUS: Phosphorus: 3.2 mg/dL (ref 2.5–4.6)

## 2022-06-30 LAB — PROCALCITONIN: Procalcitonin: <0.1 ng/mL

## 2022-06-30 LAB — MAGNESIUM: Magnesium: 1.7 mg/dL (ref 1.7–2.4)

## 2022-06-30 MED ORDER — ONDANSETRON HCL 4 MG/2ML IJ SOLN: 4.0000 mg | Freq: Four times a day (QID) | INTRAMUSCULAR | Status: DC | PRN

## 2022-06-30 MED ORDER — ONDANSETRON HCL 4 MG PO TABS: 4.0000 mg | ORAL_TABLET | Freq: Four times a day (QID) | ORAL | Status: DC | PRN

## 2022-06-30 MED ORDER — POTASSIUM CHLORIDE CRYS ER 20 MEQ PO TBCR
40.0000 meq | EXTENDED_RELEASE_TABLET | Freq: Once | ORAL | Status: AC
  Administered 2022-06-30: 40 meq via ORAL
  Filled 2022-06-30: qty 2

## 2022-06-30 MED ORDER — IPRATROPIUM-ALBUTEROL 0.5-2.5 (3) MG/3ML IN SOLN
3.0000 mL | Freq: Four times a day (QID) | RESPIRATORY_TRACT | Status: DC
  Administered 2022-06-30 – 2022-07-01 (×2): 3 mL via RESPIRATORY_TRACT
  Filled 2022-06-30 (×2): qty 3

## 2022-06-30 MED ORDER — ACETAMINOPHEN 325 MG PO TABS: 650.0000 mg | ORAL_TABLET | Freq: Four times a day (QID) | ORAL | Status: DC | PRN

## 2022-06-30 MED ORDER — URSODIOL 300 MG PO CAPS
300.0000 mg | ORAL_CAPSULE | Freq: Two times a day (BID) | ORAL | Status: DC
  Administered 2022-06-30 – 2022-07-02 (×4): 300 mg via ORAL
  Filled 2022-06-30 (×5): qty 1

## 2022-06-30 MED ORDER — PREDNISONE 5 MG PO TABS
30.0000 mg | ORAL_TABLET | Freq: Every day | ORAL | Status: DC
  Administered 2022-07-01 – 2022-07-02 (×2): 30 mg via ORAL
  Filled 2022-06-30 (×2): qty 2

## 2022-06-30 MED ORDER — POTASSIUM CHLORIDE CRYS ER 20 MEQ PO TBCR
40.0000 meq | EXTENDED_RELEASE_TABLET | Freq: Every day | ORAL | Status: AC
  Administered 2022-06-30: 40 meq via ORAL
  Filled 2022-06-30: qty 2

## 2022-06-30 MED ORDER — ENOXAPARIN SODIUM 40 MG/0.4ML IJ SOSY
40.0000 mg | PREFILLED_SYRINGE | INTRAMUSCULAR | Status: DC
  Administered 2022-06-30 – 2022-07-01 (×2): 40 mg via SUBCUTANEOUS
  Filled 2022-06-30 (×2): qty 0.4

## 2022-06-30 MED ORDER — LACTATED RINGERS IV BOLUS (SEPSIS)
1000.0000 mL | Freq: Once | INTRAVENOUS | Status: AC
  Administered 2022-06-30: 1000 mL via INTRAVENOUS

## 2022-06-30 MED ORDER — METHYLPREDNISOLONE SODIUM SUCC 125 MG IJ SOLR
125.0000 mg | Freq: Once | INTRAMUSCULAR | Status: AC
  Administered 2022-06-30: 125 mg via INTRAVENOUS
  Filled 2022-06-30: qty 2

## 2022-06-30 MED ORDER — PREDNISONE 20 MG PO TABS: 40.0000 mg | ORAL_TABLET | Freq: Every day | ORAL | Status: DC

## 2022-06-30 MED ORDER — VITAMIN B-12 1000 MCG PO TABS
1000.0000 ug | ORAL_TABLET | Freq: Every day | ORAL | Status: DC
  Administered 2022-07-01 – 2022-07-02 (×2): 1000 ug via ORAL
  Filled 2022-06-30 (×2): qty 1

## 2022-06-30 MED ORDER — PANTOPRAZOLE SODIUM 40 MG PO TBEC
40.0000 mg | DELAYED_RELEASE_TABLET | Freq: Every day | ORAL | Status: DC
  Administered 2022-07-01 – 2022-07-02 (×2): 40 mg via ORAL
  Filled 2022-06-30 (×2): qty 1

## 2022-06-30 MED ORDER — LORATADINE 10 MG PO TABS
10.0000 mg | ORAL_TABLET | Freq: Every day | ORAL | Status: DC
  Administered 2022-07-01 – 2022-07-02 (×2): 10 mg via ORAL
  Filled 2022-06-30 (×2): qty 1

## 2022-06-30 MED ORDER — INSULIN ASPART 100 UNIT/ML IJ SOLN: 0.0000 [IU] | Freq: Three times a day (TID) | INTRAMUSCULAR | Status: DC

## 2022-06-30 MED ORDER — ATORVASTATIN CALCIUM 10 MG PO TABS
10.0000 mg | ORAL_TABLET | Freq: Every day | ORAL | Status: DC
  Administered 2022-07-01 – 2022-07-02 (×2): 10 mg via ORAL
  Filled 2022-06-30 (×2): qty 1

## 2022-06-30 MED ORDER — SODIUM CHLORIDE 0.9 % IV SOLN
500.0000 mg | INTRAVENOUS | Status: DC
  Administered 2022-06-30 – 2022-07-01 (×2): 500 mg via INTRAVENOUS
  Filled 2022-06-30 (×3): qty 5

## 2022-06-30 MED ORDER — ACETAMINOPHEN 650 MG RE SUPP: 650.0000 mg | Freq: Four times a day (QID) | RECTAL | Status: DC | PRN

## 2022-06-30 MED ORDER — MAGNESIUM SULFATE 2 GM/50ML IV SOLN
2.0000 g | Freq: Once | INTRAVENOUS | Status: AC
  Administered 2022-06-30: 2 g via INTRAVENOUS
  Filled 2022-06-30: qty 50

## 2022-06-30 MED ORDER — SODIUM CHLORIDE 0.9 % IV SOLN
1.0000 g | INTRAVENOUS | Status: DC
  Administered 2022-06-30 – 2022-07-01 (×2): 1 g via INTRAVENOUS
  Filled 2022-06-30 (×4): qty 10

## 2022-06-30 MED ORDER — ALBUTEROL SULFATE (2.5 MG/3ML) 0.083% IN NEBU: 2.5000 mg | INHALATION_SOLUTION | Freq: Four times a day (QID) | RESPIRATORY_TRACT | Status: DC | PRN

## 2022-06-30 MED ORDER — SERTRALINE HCL 50 MG PO TABS
50.0000 mg | ORAL_TABLET | Freq: Every day | ORAL | Status: DC
  Administered 2022-07-01 – 2022-07-02 (×2): 50 mg via ORAL
  Filled 2022-06-30 (×2): qty 1

## 2022-06-30 MED ORDER — MONTELUKAST SODIUM 10 MG PO TABS
10.0000 mg | ORAL_TABLET | Freq: Every day | ORAL | Status: DC
  Administered 2022-06-30 – 2022-07-01 (×2): 10 mg via ORAL
  Filled 2022-06-30 (×2): qty 1

## 2022-06-30 MED ORDER — LACTATED RINGERS IV BOLUS
1000.0000 mL | Freq: Once | INTRAVENOUS | Status: AC
  Administered 2022-06-30: 1000 mL via INTRAVENOUS

## 2022-06-30 MED ORDER — GUAIFENESIN ER 600 MG PO TB12
600.0000 mg | ORAL_TABLET | Freq: Two times a day (BID) | ORAL | Status: DC
  Administered 2022-06-30 – 2022-07-01 (×2): 600 mg via ORAL
  Filled 2022-06-30 (×2): qty 1

## 2022-06-30 MED ORDER — LACTATED RINGERS IV SOLN: INTRAVENOUS | Status: DC

## 2022-06-30 MED ORDER — LACTATED RINGERS IV SOLN: 150.0000 mL/h | INTRAVENOUS | Status: DC

## 2022-06-30 MED ORDER — PREDNISONE 5 MG PO TABS: 10.0000 mg | ORAL_TABLET | Freq: Every day | ORAL | Status: DC

## 2022-06-30 MED ORDER — PREDNISONE 20 MG PO TABS: 20.0000 mg | ORAL_TABLET | Freq: Every day | ORAL | Status: DC

## 2022-06-30 NOTE — ED Provider Notes (Signed)
[place] AT [facility] Provider Note   CSN: [id number] Arrival date & time: 06/30/22  1140     History  Chief Complaint  Patient presents with   Shortness of Breath    Lori Butler is a 72 y.o. female.  72 year old female who presents with worsening cough and shortness of breath.  History of asthma and COPD.  Talk to her doctor this week about the symptoms and was placed on steroids without relief.  Now she has increased dyspnea on exertion.  Also notes increased palpitations when she tries to ambulate.  Denies any vomiting or diarrhea.  Does endorse increased weakness.  Called EMS today and was found to have a fever as well as diffuse wheezing.  Given Tylenol and albuterol with some relief.  Patient chronically on 2 L of oxygen daily       Home Medications Prior to Admission medications   Medication Sig Start Date End Date Taking? Authorizing Provider  albuterol (PROVENTIL) (2.5 MG/3ML) 0.083% nebulizer solution Take 3 mLs (2.5 mg total) by nebulization every 6 (six) hours as needed for wheezing or shortness of breath. 06/21/18   Rigoberto Noel, MD  albuterol (VENTOLIN HFA) 108 (90 Base) MCG/ACT inhaler Inhale 2 puffs into the lungs every 6 (six) hours as needed for wheezing or shortness of breath. 12/24/20   Magdalen Spatz, NP  alendronate (FOSAMAX) 70 MG tablet Take 70 mg by mouth every Wednesday.  07/11/17   [provider]  atorvastatin (LIPITOR) 10 MG tablet Take 10 mg by mouth daily.    [provider]  Cholecalciferol (VITAMIN D) 50 MCG (2000 UT) tablet Take 2,000 Units by mouth daily.    [provider]  Cyanocobalamin (VITAMIN B-12) 5000 MCG TBDP Take 1 tablet by mouth daily. 11/14/19   [provider]  Ferrous Sulfate (IRON PO) Take 325 mg by mouth daily.    [provider]  fluticasone (FLONASE) 50 MCG/ACT nasal spray Place 1-2 sprays into both nostrils daily. 10/20/21   Cobb, Karie Schwalbe, NP   Fluticasone-Umeclidin-Vilant (TRELEGY ELLIPTA) 200-62.5-25 MCG/ACT AEPB INHALE 1 PUFF INTO THE LUNGS EVERY DAY 04/11/22   Rigoberto Noel, MD  guaiFENesin (MUCINEX) 600 MG 12 hr tablet Take 2 tablets (1,200 mg total) by mouth 2 (two) times daily. Patient taking differently: Take 600 mg by mouth 2 (two) times daily as needed for cough. 02/06/19   Martyn Ehrich, NP  loratadine (CLARITIN) 10 MG tablet Take 1 tablet (10 mg total) by mouth daily. 10/20/21   Cobb, Karie Schwalbe, NP  montelukast (SINGULAIR) 10 MG tablet Take 1 tablet (10 mg total) by mouth at bedtime. 12/26/17   Arnell Asal, NP  pantoprazole (PROTONIX) 40 MG tablet Take 1 tablet (40 mg total) by mouth 2 (two) times daily. Patient taking differently: Take 40 mg by mouth daily. 12/26/17   Arnell Asal, NP  predniSONE (DELTASONE) 10 MG tablet Take 4 tabs by mouth once daily x4 days, then 3 tabs x4 days, 2 tabs x4 days, 1 tab x4 days and stop. 06/26/22   Rigoberto Noel, MD  sertraline (ZOLOFT) 50 MG tablet Take 1 tablet (50 mg total) by mouth daily. 03/01/18   McNew, Tyson Babinski, MD  ursodiol (ACTIGALL) 250 MG tablet Take 250 mg by mouth 2 (two) times daily.    [provider]      Allergies    Demerol [meperidine], Oxycodone-acetaminophen, and Tramadol    Review of Systems  Review of Systems  All other systems reviewed and are negative.   Physical Exam Updated Vital Signs BP (!) 156/80 (BP Location: Right Arm)   Pulse (!) 115   Temp 97.9 F (36.6 C) (Oral)   Resp (!) 30   SpO2 98%  Physical Exam Vitals and nursing note reviewed.  Constitutional:      General: She is not in acute distress.    Appearance: Normal appearance. She is well-developed. She is not toxic-appearing.  HENT:     Head: Normocephalic and atraumatic.  Eyes:     General: Lids are normal.     Conjunctiva/sclera: Conjunctivae normal.     Pupils: Pupils are equal, round, and reactive to light.  Neck:     Thyroid: No thyroid mass.     Trachea:  No tracheal deviation.  Cardiovascular:     Rate and Rhythm: Regular rhythm. Tachycardia present.     Heart sounds: Normal heart sounds. No murmur heard.    No gallop.  Pulmonary:     Effort: Tachypnea, prolonged expiration and respiratory distress present.     Breath sounds: No stridor. Examination of the right-upper field reveals decreased breath sounds and wheezing. Examination of the left-upper field reveals decreased breath sounds and wheezing. Decreased breath sounds and wheezing present. No rhonchi or rales.  Abdominal:     General: There is no distension.     Palpations: Abdomen is soft.     Tenderness: There is no abdominal tenderness. There is no rebound.  Musculoskeletal:        General: No tenderness. Normal range of motion.     Cervical back: Normal range of motion and neck supple.  Skin:    General: Skin is warm and dry.     Findings: No abrasion or rash.  Neurological:     Mental Status: She is alert and oriented to person, place, and time. Mental status is at baseline.     GCS: GCS eye subscore is 4. GCS verbal subscore is 5. GCS motor subscore is 6.     Cranial Nerves: No cranial nerve deficit.     Sensory: No sensory deficit.     Motor: Motor function is intact.  Psychiatric:        Attention and Perception: Attention normal.        Speech: Speech normal.        Behavior: Behavior normal.     ED Results / Procedures / Treatments   Labs (all labs ordered are listed, but only abnormal results are displayed) Labs Reviewed  RESP PANEL BY RT-PCR (RSV, FLU A&B, COVID)  [id number]  CULTURE, BLOOD (ROUTINE X 2)  CULTURE, BLOOD (ROUTINE X 2)  CBC WITH DIFFERENTIAL/PLATELET  BASIC METABOLIC PANEL  URINALYSIS, ROUTINE W REFLEX MICROSCOPIC  LACTIC ACID, PLASMA  LACTIC ACID, PLASMA    EKG None  Radiology No results found.  Procedures Procedures    Medications Ordered in ED Medications  lactated ringers infusion (has no administration in time range)    ED  Course/ Medical Decision Making/ A&P                             Medical Decision Making Amount and/or Complexity of Data Reviewed Labs: ordered. Radiology: ordered. ECG/medicine tests: ordered.  Risk Prescription drug management. Decision regarding hospitalization.   Patient's chest x-ray per interpretation shows no acute findings.  Patient's EKG my interpretation shows no acute ischemic changes.  His COVID test  was negative here.  Slightly elevated lactate.  Suspicion for possible bacterial pneumonia.  Placed on IV antibiotics.  Also treated for possible COPD exacerbation.  Patient will require admission.  Case discussed with hospitalist team        Final Clinical Impression(s) / ED Diagnoses Final diagnoses:  None    Rx / DC Orders ED Discharge Orders     None         Lacretia Leigh, MD 06/30/22 1616

## 2022-06-30 NOTE — ED Notes (Signed)
Nurse received pt in bed on 2L of oxygen watching TV, pt is AAOx4. Pt stated she did not need anything, call light at bedside.

## 2022-06-30 NOTE — H&P (Signed)
History and Physical    Patient: Lori Butler DOB: 01-08-1951 DOA: 06/30/2022 DOS: the patient was seen and examined on 06/30/2022 PCP: [name], MD  Patient coming from: Home  Chief Complaint:  Chief Complaint  Patient presents with   Shortness of Breath   HPI: Lori Butler is a 72 y.o. female with medical history significant of chronic respiratory failure on home oxygen at 2 LPM via [place], history of COVID-19 pneumonia, history of lung cancer, pulmonary nodule, COPD/asthma, seasonal allergic rhinitis, history of tobacco abuse in remission, anxiety, depression, history of suicidal attempt by overdosing with bupropion, nephrolithiasis, vitamin B12 deficiency, vitamin D deficiency who presented to the emergency department with complaints of progressively worsening dyspnea over the last 4 days associated with wheezing and nonproductive cough after she had a mild URI symptoms last week that she thought were going away.  However, she started having these symptoms at the beginning of the week.  She called her doctor who prescribed her some prednisone.  She was feeling better yesterday, but then symptoms worsened today.  She has been having chills and was found to be febrile by EMS. She denied hemoptysis, chest pain, palpitations, diaphoresis, PND, orthopnea or pitting edema of the lower extremities.  No abdominal pain, nausea, emesis, diarrhea, constipation, melena or hematochezia.  No flank pain, dysuria, frequency or hematuria.  No polyuria, polydipsia, polyphagia or blurred vision.   Lab work: Urinalysis was normal.  CBC showed a white count of 9.0, hemoglobin 11.9 g/dL platelets 287.  Negative coronavirus, influenza and RSV PCR.  Lactic acid 2.8 then 3.1 mmol/L.  BMP showed potassium of 3.2 and CO2 of 21 mmol/L.  Glucose 200 and calcium 8.4 mg/dL.  Sodium, chloride and renal function were normal.  Imaging: Portable 1 view chest radiograph with posttreatment changes of the  left lung apex.  No consolidation, pneumothorax or effusion.  Slight elevation of the right hemidiaphragm.  Stable cardiopericardial silhouette without edema.  ED course: Initial vital signs were temperature 97.9 F, pulse 119, respirations 30, BP 156/80 mmHg O2 sat 98% on room air.  Patient received azithromycin 500 mg IVPB, ceftriaxone 1 g IVPB and methylprednisolone 125 mg IVP.   Review of Systems: As mentioned in the history of present illness. All other systems reviewed and are negative.  Past Medical History:  Diagnosis Date   Acute respiratory failure with hypoxia (HCC)    Anxiety    Anxiety and depression 05/05/2018   Aortic atherosclerosis ([place])    Arthritis    "a little in my hands probably" (10/31/2017)   Asthma    Attempted suicide ([place]) 02/21/2018   Cellulitis of right leg 02/28/2019   Chronic respiratory failure with hypoxia ([place]) 03/05/2020   COPD (chronic obstructive pulmonary disease) ([place])    COPD exacerbation ([place])    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   FEV1 53%  Clinically seems to have a component of asthma  - alpha one AT   03/01/2019  =  MM level   196   Depression    History of kidney stones    lung ca dx'd 10/2018   Pneumonia due to COVID-19 virus 02/04/2020   Seasonal allergic rhinitis 08/15/2021   Solitary pulmonary nodule on lung CT 10/22/2018   Suicide attempt ([place]) 02/21/2018   overdosed on Wellbutrin   Tobacco abuse 09/20/2017   Vitamin B12 deficiency 11/10/2017   Vitamin D deficiency 11/10/2017   Past Surgical History:  Procedure Laterality Date   ABDOMINAL HYSTERECTOMY  APPENDECTOMY     BREAST BIOPSY Left 1990s   CATARACT EXTRACTION W/ INTRAOCULAR LENS  IMPLANT, BILATERAL Bilateral    DILATION AND CURETTAGE OF UTERUS     FUDUCIAL PLACEMENT N/A 11/11/2018   Procedure: PLACEMENT OF FUDUCIAL;  Surgeon: Melrose Nakayama, MD;  Location: [place];  Service: Thoracic;  Laterality: N/A;   TONSILLECTOMY     TUBAL LIGATION     VIDEO BRONCHOSCOPY WITH  ENDOBRONCHIAL NAVIGATION N/A 11/11/2018   Procedure: VIDEO BRONCHOSCOPY WITH ENDOBRONCHIAL NAVIGATION;  Surgeon: Melrose Nakayama, MD;  Location: [facility];  Service: Thoracic;  Laterality: N/A;   Social History:  reports that she quit smoking about 4 years ago. Her smoking use included cigarettes. She has a 76.50 pack-year smoking history. She has never used smokeless tobacco. She reports that she does not currently use alcohol. She reports that she does not use drugs.  Allergies  Allergen Reactions   Demerol [Meperidine] Other (See Comments)    "horrible reaction"   Oxycodone-Acetaminophen Other (See Comments)    Upset stomach    Tramadol Nausea And Vomiting    Family History  Problem Relation Age of Onset   Breast cancer Mother     Prior to Admission medications   Medication Sig Start Date End Date Taking? Authorizing Provider  albuterol (PROVENTIL) (2.5 MG/3ML) 0.083% nebulizer solution Take 3 mLs (2.5 mg total) by nebulization every 6 (six) hours as needed for wheezing or shortness of breath. 06/21/18   Rigoberto Noel, MD  albuterol (VENTOLIN HFA) 108 (90 Base) MCG/ACT inhaler Inhale 2 puffs into the lungs every 6 (six) hours as needed for wheezing or shortness of breath. 12/24/20   Magdalen Spatz, NP  alendronate (FOSAMAX) 70 MG tablet Take 70 mg by mouth every Wednesday.  07/11/17   [provider]  atorvastatin (LIPITOR) 10 MG tablet Take 10 mg by mouth daily.    [provider]  Cholecalciferol (VITAMIN D) 50 MCG (2000 UT) tablet Take 2,000 Units by mouth daily.    [provider]  Cyanocobalamin (VITAMIN B-12) 5000 MCG TBDP Take 1 tablet by mouth daily. 11/14/19   [provider]  Ferrous Sulfate (IRON PO) Take 325 mg by mouth daily.    [provider]  fluticasone (FLONASE) 50 MCG/ACT nasal spray Place 1-2 sprays into both nostrils daily. 10/20/21   Cobb, Karie Schwalbe, NP  Fluticasone-Umeclidin-Vilant (TRELEGY ELLIPTA) 200-62.5-25 MCG/ACT  AEPB INHALE 1 PUFF INTO THE LUNGS EVERY DAY 04/11/22   Rigoberto Noel, MD  guaiFENesin (MUCINEX) 600 MG 12 hr tablet Take 2 tablets (1,200 mg total) by mouth 2 (two) times daily. Patient taking differently: Take 600 mg by mouth 2 (two) times daily as needed for cough. 02/06/19   Martyn Ehrich, NP  loratadine (CLARITIN) 10 MG tablet Take 1 tablet (10 mg total) by mouth daily. 10/20/21   Cobb, Karie Schwalbe, NP  montelukast (SINGULAIR) 10 MG tablet Take 1 tablet (10 mg total) by mouth at bedtime. 12/26/17   Arnell Asal, NP  pantoprazole (PROTONIX) 40 MG tablet Take 1 tablet (40 mg total) by mouth 2 (two) times daily. Patient taking differently: Take 40 mg by mouth daily. 12/26/17   Arnell Asal, NP  predniSONE (DELTASONE) 10 MG tablet Take 4 tabs by mouth once daily x4 days, then 3 tabs x4 days, 2 tabs x4 days, 1 tab x4 days and stop. 06/26/22   Rigoberto Noel, MD  sertraline (ZOLOFT) 50 MG tablet Take 1 tablet (50 mg  total) by mouth daily. 03/01/18   McNew, Tyson Babinski, MD  ursodiol (ACTIGALL) 250 MG tablet Take 250 mg by mouth 2 (two) times daily.    [provider]    Physical Exam: Vitals:   06/30/22 1150 06/30/22 1201 06/30/22 1315 06/30/22 1430  BP:  (!) 156/80 122/68 (!) 122/58  Pulse:  (!) 115 (!) 104 97  Resp:  (!) 30 (!) 26 19  Temp:  97.9 F (36.6 C)    TempSrc:  Oral    SpO2: 100% 98% 95% 97%   Physical Exam Vitals and nursing note reviewed.  Constitutional:      General: She is awake. She is not in acute distress.    Appearance: She is well-developed.     Interventions: Nasal cannula in place.  HENT:     Head: Normocephalic.     Nose: No rhinorrhea.     Mouth/Throat:     Mouth: Mucous membranes are moist.     Pharynx: No oropharyngeal exudate.  Eyes:     General: No scleral icterus.    Pupils: Pupils are equal, round, and reactive to light.  Neck:     Vascular: No JVD.  Cardiovascular:     Rate and Rhythm: Regular rhythm. Tachycardia present.     Heart  sounds: S1 normal and S2 normal.  Pulmonary:     Effort: Pulmonary effort is normal. Tachypnea present. No accessory muscle usage or respiratory distress.     Breath sounds: Decreased breath sounds and wheezing present. No rhonchi or rales.  Abdominal:     General: Bowel sounds are normal.     Palpations: Abdomen is soft.  Musculoskeletal:     Cervical back: Neck supple.     Right lower leg: No edema.     Left lower leg: No edema.  Skin:    General: Skin is warm and dry.  Neurological:     General: No focal deficit present.     Mental Status: She is alert and oriented to person, place, and time.  Psychiatric:        Behavior: Behavior is cooperative.   Data Reviewed:  Results are pending, will review when available.  Vent. rate 118 BPM PR interval 142 ms QRS duration 75 ms QT/QTcB 331/464 ms P-R-T axes 89 86 81 Sinus tachycardia Multiple premature complexes, vent & supraven Borderline right axis deviation Low voltage, precordial leads Probable anteroseptal infarct, old  Assessment and Plan: Principal Problem:   Acute on chronic respiratory failure (HCC) Secondary to:   Acute exacerbation of COPD with asthma ([place]) Secondary to progressively worse respiratory infection. Admit to telemetry/inpatient. Continue supplemental oxygen. Scheduled and as needed bronchodilators. Continue ceftriaxone 1 g IVPB daily. Continue azithromycin 500 mg IVPB daily. Check strep pneumoniae urinary antigen. Check sputum Gram stain, culture and sensitivity. Check respiratory pathogen by PCR. Check procalcitonin level now and tomorrow morning. Follow-up blood culture and sensitivity. Follow-up CBC and chemistry in the morning.  Active Problems:   Lactic acidosis Sepsis versus beta agonist use. LR bolus x 2 ordered. Continue maintenance IV fluids afterwards. Continue antibiotics as above. Follow-up lactic acid level.    Hypokalemia Potassium supplementation ordered. Magnesium has  been supplemented. Follow-up potassium level in the morning.    Anxiety and depression Continue sertraline 50 mg p.o. daily.    Seasonal allergic rhinitis Continue montelukast 10 mg p.o. bedtime. Continue loratadine 10 mg p.o. daily.    GERD (gastroesophageal reflux disease) Continue pantoprazole 40 mg p.o. daily.  Advance Care Planning:   Code Status: Full Code   Consults:   Family Communication:   Severity of Illness: The appropriate patient status for this patient is INPATIENT. Inpatient status is judged to be reasonable and necessary in order to provide the required intensity of service to ensure the patient's safety. The patient's presenting symptoms, physical exam findings, and initial radiographic and laboratory data in the context of their chronic comorbidities is felt to place them at high risk for further clinical deterioration. Furthermore, it is not anticipated that the patient will be medically stable for discharge from the hospital within 2 midnights of admission.   * I certify that at the point of admission it is my clinical judgment that the patient will require inpatient hospital care spanning beyond 2 midnights from the point of admission due to high intensity of service, high risk for further deterioration and high frequency of surveillance required.*  Author: Reubin Milan, MD 06/30/2022 4:16 PM  For on call review [email or web address].   This document was prepared using Dragon voice recognition software and may contain some unintended transcription errors.

## 2022-06-30 NOTE — ED Notes (Signed)
ED TO INPATIENT HANDOFF REPORT  ED Nurse Name and Phone #: Alroy Bailiff Name/Age/Gender Lori Butler 72 y.o. female Room/Bed: WA02/WA02  Code Status   Code Status: Full Code  Home/SNF/Other Home Patient oriented to: self, place, time, and situation Is this baseline? Yes   Triage Complete: Triage complete  Chief Complaint Acute on chronic respiratory failure ([place]) [J96.20]  Triage Note BIB EMS for SOB since this AM. HX of copd and asthma. Wears [place] at home, increased from 2L to 4L and did breathing tx at home PTA. EMS gave tylenol for fever   Allergies Allergies  Allergen Reactions   Demerol [Meperidine] Other (See Comments)    "horrible reaction"   Oxycodone-Acetaminophen Other (See Comments)    Upset stomach    Tramadol Nausea And Vomiting    Level of Care/Admitting Diagnosis ED Disposition     ED Disposition  Admit   Condition  --   [facility] Area: [place] [[id number]]  Level of Care: Telemetry [5]  Admit to tele based on following criteria: Other see comments  Comments: Acute respiratory failure  May admit patient to [facility] or [facility] if equivalent level of care is available:: No  Covid Evaluation: Asymptomatic - no recent exposure (last 10 days) testing not required  Diagnosis: Acute on chronic respiratory failure [id number]  Admitting Physician: Reubin Milan [id number]  Attending Physician: Reubin Milan [id number]  Certification:: I certify this patient will need inpatient services for at least 2 midnights  Estimated Length of Stay: 2          B Medical/Surgery History Past Medical History:  Diagnosis Date   Acute respiratory failure with hypoxia ([place])    Anxiety    Anxiety and depression 05/05/2018   Aortic atherosclerosis ([place])    Arthritis    "a little in my hands probably" (10/31/2017)   Asthma    Attempted suicide ([place]) 02/21/2018   Cellulitis of right leg 02/28/2019   Chronic  respiratory failure with hypoxia ([place]) 03/05/2020   COPD (chronic obstructive pulmonary disease) ([place])    COPD exacerbation ([place])    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   FEV1 53%  Clinically seems to have a component of asthma  - alpha one AT   03/01/2019  =  MM level   196   Depression    History of kidney stones    lung ca dx'd 10/2018   Pneumonia due to COVID-19 virus 02/04/2020   Seasonal allergic rhinitis 08/15/2021   Solitary pulmonary nodule on lung CT 10/22/2018   Suicide attempt ([place]) 02/21/2018   overdosed on Wellbutrin   Tobacco abuse 09/20/2017   Vitamin B12 deficiency 11/10/2017   Vitamin D deficiency 11/10/2017   Past Surgical History:  Procedure Laterality Date   ABDOMINAL HYSTERECTOMY     APPENDECTOMY     BREAST BIOPSY Left 1990s   CATARACT EXTRACTION W/ INTRAOCULAR LENS  IMPLANT, BILATERAL Bilateral    DILATION AND CURETTAGE OF UTERUS     FUDUCIAL PLACEMENT N/A 11/11/2018   Procedure: PLACEMENT OF FUDUCIAL;  Surgeon: Melrose Nakayama, MD;  Location: [facility];  Service: Thoracic;  Laterality: N/A;   TONSILLECTOMY     TUBAL LIGATION     VIDEO BRONCHOSCOPY WITH ENDOBRONCHIAL NAVIGATION N/A 11/11/2018   Procedure: VIDEO BRONCHOSCOPY WITH ENDOBRONCHIAL NAVIGATION;  Surgeon: Melrose Nakayama, MD;  Location: [facility];  Service: Thoracic;  Laterality: N/A;     A IV Location/Drains/Wounds Patient Lines/Drains/Airways Status  Active Line/Drains/Airways     Name Placement date Placement time Site Days   Peripheral IV 02/06/22 22 G 1" Left Antecubital 02/06/22  1220  Antecubital  144   Peripheral IV 06/30/22 20 G Anterior;Left Forearm 06/30/22  1311  Forearm  less than 1            Intake/Output Last 24 hours No intake or output data in the 24 hours ending 06/30/22 1642  Labs/Imaging Results for orders placed or performed during the hospital encounter of 06/30/22 (from the past 48 hour(s))  Resp panel by RT-PCR (RSV, Flu A&B, Covid) Anterior Nasal Swab     Status:  None   Collection Time: 06/30/22 12:09 PM   Specimen: Anterior Nasal Swab  Result Value Ref Range   SARS Coronavirus 2 by RT PCR NEGATIVE NEGATIVE    Comment: (NOTE) SARS-CoV-2 target nucleic acids are NOT DETECTED.  The SARS-CoV-2 RNA is generally detectable in upper respiratory specimens during the acute phase of infection. The lowest concentration of SARS-CoV-2 viral copies this assay can detect is 138 copies/mL. A negative result does not preclude SARS-Cov-2 infection and should not be used as the sole basis for treatment or other patient management decisions. A negative result may occur with  improper specimen collection/handling, submission of specimen other than nasopharyngeal swab, presence of viral mutation(s) within the areas targeted by this assay, and inadequate number of viral copies(<138 copies/mL). A negative result must be combined with clinical observations, patient history, and epidemiological information. The expected result is Negative.  Fact Sheet for Patients:  EntrepreneurPulse.com.au  Fact Sheet for Healthcare Providers:  IncredibleEmployment.be  This test is no t yet approved or cleared by the [place] FDA and  has been authorized for detection and/or diagnosis of SARS-CoV-2 by FDA under an Emergency Use Authorization (EUA). This EUA will remain  in effect (meaning this test can be used) for the duration of the COVID-19 declaration under Section 564(b)(1) of the Act, 21 U.S.C.section 360bbb-3(b)(1), unless the authorization is terminated  or revoked sooner.       Influenza A by PCR NEGATIVE NEGATIVE   Influenza B by PCR NEGATIVE NEGATIVE    Comment: (NOTE) The Xpert Xpress SARS-CoV-2/FLU/RSV plus assay is intended as an aid in the diagnosis of influenza from Nasopharyngeal swab specimens and should not be used as a sole basis for treatment. Nasal washings and aspirates are unacceptable for Xpert Xpress  SARS-CoV-2/FLU/RSV testing.  Fact Sheet for Patients: EntrepreneurPulse.com.au  Fact Sheet for Healthcare Providers: IncredibleEmployment.be  This test is not yet approved or cleared by the [place] FDA and has been authorized for detection and/or diagnosis of SARS-CoV-2 by FDA under an Emergency Use Authorization (EUA). This EUA will remain in effect (meaning this test can be used) for the duration of the COVID-19 declaration under Section 564(b)(1) of the Act, 21 U.S.C. section 360bbb-3(b)(1), unless the authorization is terminated or revoked.     Resp Syncytial Virus by PCR NEGATIVE NEGATIVE    Comment: (NOTE) Fact Sheet for Patients: EntrepreneurPulse.com.au  Fact Sheet for Healthcare Providers: IncredibleEmployment.be  This test is not yet approved or cleared by the [place] FDA and has been authorized for detection and/or diagnosis of SARS-CoV-2 by FDA under an Emergency Use Authorization (EUA). This EUA will remain in effect (meaning this test can be used) for the duration of the COVID-19 declaration under Section 564(b)(1) of the Act, 21 U.S.C. section 360bbb-3(b)(1), unless the authorization is terminated or revoked.  Performed at [facility], [place] [address] [place], [place] [address]   Urinalysis, Routine w reflex microscopic -Urine, Clean Catch     Status: None   Collection Time: 06/30/22 12:09 PM  Result Value Ref Range   Color, Urine YELLOW YELLOW   APPearance CLEAR CLEAR   Specific Gravity, Urine 1.009 1.005 - 1.030   pH 5.0 5.0 - 8.0   Glucose, UA NEGATIVE NEGATIVE mg/dL   Hgb urine dipstick NEGATIVE NEGATIVE   Bilirubin Urine NEGATIVE NEGATIVE   Ketones, ur NEGATIVE NEGATIVE mg/dL   Protein, ur NEGATIVE NEGATIVE mg/dL   Nitrite NEGATIVE NEGATIVE   Leukocytes,Ua NEGATIVE NEGATIVE    Comment: Performed at [facility], [place] [address] [place], [place] [address]  CBC with Differential/Platelet     Status: Abnormal   Collection Time: 06/30/22  1:12 PM  Result Value Ref Range   WBC 9.0 4.0 - 10.5 K/uL   RBC 4.07 3.87 - 5.11 MIL/uL   Hemoglobin 11.9 (L) 12.0 - 15.0 g/dL   HCT 37.0 36.0 - 46.0 %   MCV 90.9 80.0 - 100.0 fL   MCH 29.2 26.0 - 34.0 pg   MCHC 32.2 30.0 - 36.0 g/dL   RDW 13.2 11.5 - 15.5 %   Platelets 287 150 - 400 K/uL   nRBC 0.0 0.0 - 0.2 %   Neutrophils Relative % 84 %   Neutro Abs 7.6 1.7 - 7.7 K/uL   Lymphocytes Relative 13 %   Lymphs Abs 1.2 0.7 - 4.0 K/uL   Monocytes Relative 2 %   Monocytes Absolute 0.1 0.1 - 1.0 K/uL   Eosinophils Relative 0 %   Eosinophils Absolute 0.0 0.0 - 0.5 K/uL   Basophils Relative 0 %   Basophils Absolute 0.0 0.0 - 0.1 K/uL   Immature Granulocytes 1 %   Abs Immature Granulocytes 0.09 (H) 0.00 - 0.07 K/uL    Comment: Performed at [facility], [place] [address], [place], [place] [id number]  Basic metabolic panel     Status: Abnormal   Collection Time: 06/30/22  1:12 PM  Result Value Ref Range   Sodium 140 135 - 145 mmol/L   Potassium 3.2 (L) 3.5 - 5.1 mmol/L   Chloride 109 98 - 111 mmol/L   CO2 21 (L) 22 - 32 mmol/L   Glucose, Bld 200 (H) 70 - 99 mg/dL    Comment: Glucose reference range applies only to samples taken after fasting for at least 8 hours.   BUN 20 8 - 23 mg/dL   Creatinine, Ser 0.88 0.44 - 1.00 mg/dL   Calcium 8.4 (L) 8.9 - 10.3 mg/dL   GFR, Estimated >60 >60 mL/min    Comment: (NOTE) Calculated using the CKD-EPI Creatinine Equation (2021)    Anion gap 10 5 - 15    Comment: Performed at [facility], [place] [address], [place], [place] [address]  Lactic acid, plasma     Status: Abnormal   Collection Time: 06/30/22  1:12 PM  Result Value Ref Range   Lactic Acid, Venous 2.8 (HH) 0.5 - 1.9 mmol/L    Comment: CRITICAL RESULT CALLED TO, READ BACK BY AND VERIFIED WITH I.CORTES, RN AT 1432 ON 02.23.24 Y  N.THOMPSON Performed at [facility], [place] [address], [place], [place] [address]    DG Chest Port 1 View  Result Date: 06/30/2022 CLINICAL DATA:  Shortness of breath since this morning. History of non-small-cell lung cancer with radiation EXAM: PORTABLE CHEST 1 VIEW COMPARISON:  08/15/2021 x-ray. CT scan 02/06/2022. Older exams as well FINDINGS: Stable posttreatment changes left lung apex. No consolidation, pneumothorax or effusion. Slight elevation of the right hemidiaphragm. Stable cardiopericardial silhouette without edema. Interstitial changes are likely chronic. Overlapping cardiac leads. IMPRESSION: Chronic changes. Posttreatment changes with fiduciary markers left upper lobe. Electronically Signed   By: Jill Side M.D.   On: 06/30/2022 12:35    Pending Labs Unresulted Labs (From admission, onward)     Start     Ordered   07/07/22 0500  Creatinine, serum  (enoxaparin (LOVENOX)    CrCl >/= 30 ml/min)  Weekly,   R     Comments: while on enoxaparin therapy    06/30/22 1617   07/01/22 0500  CBC with Differential  Daily,   R      06/30/22 1611   06/30/22 1618  Magnesium  Add-on,   AD        06/30/22 1617   06/30/22 1618  Phosphorus  Add-on,   AD        06/30/22 1617   06/30/22 1616  Respiratory (~20 pathogens) panel by PCR  (COPD / Pneumonia / Cellulitis / Lower Extremity Wound)  Once,   R        06/30/22 1617   06/30/22 1615  Strep pneumoniae urinary antigen  (COPD / Pneumonia / Cellulitis / Lower Extremity Wound)  Once,   R        06/30/22 1617   06/30/22 1615  Expectorated Sputum Assessment w Gram Stain, Rflx to Resp Cult  (COPD / Pneumonia / Cellulitis / Lower Extremity Wound)  Once,   R        06/30/22 1617   06/30/22 1611  Comprehensive metabolic panel  ONCE - STAT,   STAT        06/30/22 1611   06/30/22 1205  Culture, blood (Routine X 2) w Reflex to ID Panel  BLOOD CULTURE X 2,   R     Question:  Patient immune status  Answer:  Normal   06/30/22 1205    06/30/22 1205  Lactic acid, plasma  Now then every 2 hours,   R      06/30/22 1205            Vitals/Pain Today's Vitals   06/30/22 1150 06/30/22 1201 06/30/22 1315 06/30/22 1430  BP:  (!) 156/80 122/68 (!) 122/58  Pulse:  (!) 115 (!) 104 97  Resp:  (!) 30 (!) 26 19  Temp:  97.9 F (36.6 C)    TempSrc:  Oral    SpO2: 100% 98% 95% 97%    Isolation Precautions Droplet precaution  Medications Medications  lactated ringers infusion ( Intravenous New Bag/Given 06/30/22 1318)  azithromycin (ZITHROMAX) 500 mg in sodium chloride 0.9 % 250 mL IVPB (500 mg Intravenous New Bag/Given 06/30/22 1608)  cefTRIAXone (ROCEPHIN) 1 g in sodium chloride 0.9 % 100 mL IVPB (has no administration in time range)  lactated ringers bolus 1,000 mL (has no administration in time range)  enoxaparin (LOVENOX) injection 40 mg (has no administration in time range)  acetaminophen (TYLENOL) tablet 650 mg (has no administration in time range)    Or  acetaminophen (TYLENOL) suppository 650 mg (has no administration in time range)  ondansetron (ZOFRAN) tablet 4 mg (has no administration in time range)    Or  ondansetron (ZOFRAN) injection 4 mg (has no administration in time range)  magnesium sulfate IVPB 2 g 50 mL (has no administration in time range)  ipratropium-albuterol (DUONEB) 0.5-2.5 (3) MG/3ML nebulizer solution 3 mL (has no administration in time range)  methylPREDNISolone sodium succinate (SOLU-MEDROL) 125 mg/2 mL injection 125 mg (125 mg Intravenous Given 06/30/22 1606)    Mobility walks     Focused Assessments    R Recommendations: See Admitting Provider Note  Report given to:   Additional Notes:

## 2022-06-30 NOTE — ED Triage Notes (Signed)
BIB EMS for SOB since this AM. HX of copd and asthma. Wears [place] at home, increased from 2L to 4L and did breathing tx at home PTA. EMS gave tylenol for fever

## 2022-07-01 ENCOUNTER — Encounter (HOSPITAL_COMMUNITY): Payer: Self-pay

## 2022-07-01 DIAGNOSIS — F32A Depression, unspecified: Secondary | ICD-10-CM

## 2022-07-01 DIAGNOSIS — K219 Gastro-esophageal reflux disease without esophagitis: Secondary | ICD-10-CM

## 2022-07-01 DIAGNOSIS — J441 Chronic obstructive pulmonary disease with (acute) exacerbation: Secondary | ICD-10-CM

## 2022-07-01 DIAGNOSIS — F419 Anxiety disorder, unspecified: Secondary | ICD-10-CM | POA: Diagnosis not present

## 2022-07-01 DIAGNOSIS — J45901 Unspecified asthma with (acute) exacerbation: Secondary | ICD-10-CM

## 2022-07-01 DIAGNOSIS — E876 Hypokalemia: Secondary | ICD-10-CM

## 2022-07-01 DIAGNOSIS — J9621 Acute and chronic respiratory failure with hypoxia: Secondary | ICD-10-CM | POA: Diagnosis not present

## 2022-07-01 DIAGNOSIS — E872 Acidosis, unspecified: Secondary | ICD-10-CM

## 2022-07-01 LAB — CBC WITH DIFFERENTIAL/PLATELET
Abs Immature Granulocytes: 0.08 10*3/uL — ABNORMAL HIGH (ref 0.00–0.07)
Basophils Absolute: 0 10*3/uL (ref 0.0–0.1)
Basophils Relative: 0 %
Eosinophils Absolute: 0 10*3/uL (ref 0.0–0.5)
Eosinophils Relative: 0 %
HCT: 33.7 % — ABNORMAL LOW (ref 36.0–46.0)
Hemoglobin: 10.6 g/dL — ABNORMAL LOW (ref 12.0–15.0)
Immature Granulocytes: 1 %
Lymphocytes Relative: 19 %
Lymphs Abs: 2 10*3/uL (ref 0.7–4.0)
MCH: 29 pg (ref 26.0–34.0)
MCHC: 31.5 g/dL (ref 30.0–36.0)
MCV: 92.1 fL (ref 80.0–100.0)
Monocytes Absolute: 0.4 10*3/uL (ref 0.1–1.0)
Monocytes Relative: 3 %
Neutro Abs: 7.8 10*3/uL — ABNORMAL HIGH (ref 1.7–7.7)
Neutrophils Relative %: 77 %
Platelets: 268 10*3/uL (ref 150–400)
RBC: 3.66 MIL/uL — ABNORMAL LOW (ref 3.87–5.11)
RDW: 13.2 % (ref 11.5–15.5)
WBC: 10.2 10*3/uL (ref 4.0–10.5)
nRBC: 0 % (ref 0.0–0.2)

## 2022-07-01 LAB — GLUCOSE, CAPILLARY
Glucose-Capillary: 87 mg/dL (ref 70–99)
Glucose-Capillary: 72 mg/dL (ref 70–99)
Glucose-Capillary: 85 mg/dL (ref 70–99)
Glucose-Capillary: 109 mg/dL — ABNORMAL HIGH (ref 70–99)

## 2022-07-01 LAB — RESPIRATORY PANEL BY PCR

## 2022-07-01 LAB — PROCALCITONIN: Procalcitonin: <0.1 ng/mL

## 2022-07-01 LAB — HEMOGLOBIN A1C
Hgb A1c MFr Bld: 4.9 % (ref 4.8–5.6)
Mean Plasma Glucose: 93.93 mg/dL

## 2022-07-01 LAB — STREP PNEUMONIAE URINARY ANTIGEN: Strep Pneumo Urinary Antigen: NEGATIVE

## 2022-07-01 MED ORDER — GUAIFENESIN-DM 100-10 MG/5ML PO SYRP: 10.0000 mL | ORAL_SOLUTION | ORAL | Status: DC | PRN

## 2022-07-01 MED ORDER — IPRATROPIUM-ALBUTEROL 0.5-2.5 (3) MG/3ML IN SOLN
3.0000 mL | Freq: Two times a day (BID) | RESPIRATORY_TRACT | Status: DC
  Administered 2022-07-01 – 2022-07-02 (×2): 3 mL via RESPIRATORY_TRACT
  Filled 2022-07-01 (×2): qty 3

## 2022-07-01 NOTE — Plan of Care (Signed)
  Problem: Education: Goal: Knowledge of General Education information will improve Description: Including pain rating scale, medication(s)/side effects and non-pharmacologic comfort measures Outcome: Progressing   Problem: Clinical Measurements: Goal: Diagnostic test results will improve Outcome: Progressing Goal: Respiratory complications will improve Outcome: Progressing   Problem: Activity: Goal: Risk for activity intolerance will decrease Outcome: Progressing   Problem: Nutrition: Goal: Adequate nutrition will be maintained Outcome: Progressing   Problem: Safety: Goal: Ability to remain free from injury will improve Outcome: Progressing

## 2022-07-01 NOTE — Evaluation (Signed)
Physical Therapy Evaluation Patient Details Name: Lori Butler MRN: [id number] DOB: 19-Oct-1950 Today's Date: 07/01/2022  History of Present Illness  Lori Butler is a 72 y.o. female with medical history significant of chronic respiratory failure on home oxygen at 2 LPM via [place], history of COVID-19 pneumonia, history of lung cancer, pulmonary nodule, COPD/asthma, seasonal allergic rhinitis, history of tobacco abuse in remission, anxiety, depression, history of suicidal attempt by overdosing with bupropion, nephrolithiasis, vitamin B12 deficiency, vitamin D deficiency who presented to the emergency department with complaints of progressively worsening dyspnea  Clinical Impression  Pt admitted with above diagnosis.  Pt currently with functional limitations due to the deficits listed below (see PT Problem List). Pt will benefit from skilled PT to increase their independence and safety with mobility to allow discharge to the venue listed below.     The patient reports  independent PTA, uses [employer] for transportation.  Patient ambulated  x 30' in room on 2 LPM. Patient tolerated well. Patient declines need for  HHPT     Recommendations for follow up therapy are one component of a multi-disciplinary discharge planning process, led by the attending physician.  Recommendations may be updated based on patient status, additional functional criteria and insurance authorization.  Follow Up Recommendations No PT follow up      Assistance Recommended at Discharge    Patient can return home with the following  Help with stairs or ramp for entrance;Assist for transportation    Equipment Recommendations None recommended by PT  Recommendations for Other Services       Functional Status Assessment Patient has had a recent decline in their functional status and demonstrates the ability to make significant improvements in function in a reasonable and predictable amount of time.     Precautions /  Restrictions Precautions Precaution Comments: on 2 LPM      Mobility  Bed Mobility               General bed mobility comments: standing    Transfers Overall transfer level: Needs assistance Equipment used: None Transfers: Sit to/from Stand Sit to Stand: Modified independent (Device/Increase time)                Ambulation/Gait Ambulation/Gait assistance: Supervision Gait Distance (Feet): 30 Feet Assistive device: None Gait Pattern/deviations: Step-through pattern Gait velocity: decr     General Gait Details: gait steady and slow  Stairs            Wheelchair Mobility    Modified Rankin (Stroke Patients Only)       Balance Overall balance assessment: Mild deficits observed, not formally tested                                           Pertinent Vitals/Pain Pain Assessment Pain Assessment: No/denies pain    Home Living Family/patient expects to be discharged to:: Private residence Living Arrangements: Alone Available Help at Discharge: Available PRN/intermittently;Family Type of Home: Apartment Home Access: Stairs to enter Entrance Stairs-Rails: None Entrance Stairs-Number of Steps: 3   Home Layout: One level Home Equipment: Rollator (4 wheels);Grab bars - tub/shower;Shower seat      Prior Function Prior Level of Function : Independent/Modified Independent             Mobility Comments: does not drive, uses rollator outside to g up/down steps and to shop, uses [employer], orders groceries,  no device inside ADLs Comments: IADL's, shower seat     Hand Dominance   Dominant Hand: Right    Extremity/Trunk Assessment   Upper Extremity Assessment Upper Extremity Assessment: Overall WFL for tasks assessed    Lower Extremity Assessment Lower Extremity Assessment: Generalized weakness    Cervical / Trunk Assessment Cervical / Trunk Assessment: Normal  Communication      Cognition Arousal/Alertness:  Awake/alert Behavior During Therapy: WFL for tasks assessed/performed Overall Cognitive Status: Within Functional Limits for tasks assessed                                          General Comments      Exercises     Assessment/Plan    PT Assessment Patient needs continued PT services  PT Problem List Decreased strength;Decreased activity tolerance;Decreased mobility;Decreased knowledge of use of DME       PT Treatment Interventions DME instruction;Therapeutic exercise;Gait training;Functional mobility training;Therapeutic activities;Patient/family education    PT Goals (Current goals can be found in the Care Plan section)  Acute Rehab PT Goals Patient Stated Goal: go home PT Goal Formulation: With patient Time For Goal Achievement: 07/15/22 Potential to Achieve Goals: Good    Frequency Min 3X/week     Co-evaluation               AM-PAC PT "6 Clicks" Mobility  Outcome Measure Help needed turning from your back to your side while in a flat bed without using bedrails?: None Help needed moving from lying on your back to sitting on the side of a flat bed without using bedrails?: None Help needed moving to and from a bed to a chair (including a wheelchair)?: A Little Help needed standing up from a chair using your arms (e.g., wheelchair or bedside chair)?: A Little Help needed to walk in hospital room?: A Little Help needed climbing 3-5 steps with a railing? : A Lot 6 Click Score: 19    End of Session Equipment Utilized During Treatment: Gait belt;Oxygen Activity Tolerance: Patient tolerated treatment well Patient left: in chair;with call bell/phone within reach;with chair alarm set Nurse Communication: Mobility status PT Visit Diagnosis: Unsteadiness on feet (R26.81)    Time: [id number] PT Time Calculation (min) (ACUTE ONLY): 18 min   Charges:   PT Evaluation $PT Eval Low Complexity: 1 Low          [place] Office [phone] Weekend [id number]   Claretha Cooper 07/01/2022, 3:08 PM

## 2022-07-01 NOTE — Progress Notes (Signed)
[facility]                                                                               Janessa Butler, is a 72 y.o. female, DOB - [birth year], [id number] Admit date - 06/30/2022    Outpatient Primary MD for the patient is [name], MD  LOS - 1  days    Brief summary    Lori Butler is a 72 y.o. female with medical history significant of chronic respiratory failure on home oxygen at 2 LPM via [place] , history of COVID-19 pneumonia, history of lung cancer, pulmonary nodule, COPD/asthma, seasonal allergic rhinitis, history of tobacco abuse in remission, anxiety, depression, history of suicidal attempt by overdosing with bupropion, nephrolithiasis, vitamin B12 deficiency, vitamin D deficiency who presented to the emergency department with complaints of progressively worsening dyspnea over the last 4 days associated with wheezing and nonproductive cough after she had a mild URI symptoms last week . Portable 1 view chest radiograph with posttreatment changes of the left lung apex.    Assessment & Plan    Assessment and Plan:  Acute on chronic respiratory failure secondary to acute copd exacerbation and possible early pneumonia.  On IV antibiotics and prednisone taper.  COVID negative.  Flu negative.  RVP is pending.  Lactic acid normalized.  Wbc count wnl.  Pro calcitonin wnl.  Follow up blood cultures.  Wean off oxygen.    COPD:  Wheezing has improved.    Tobacco abuse  In remission.   Hypokalemia:  Replaced.    Lactic acidosis;  Resolved.    Mild normocytic anemia:  Continue to monitor.      Estimated body mass index is 31.73 kg/m as calculated from the following:   Height as of this encounter: '5\' 5"'$  (1.651 m).   Weight as of this encounter: 86.5 kg.  Code Status: full code  DVT Prophylaxis:  enoxaparin (LOVENOX) injection 40 mg Start: 06/30/22 2200   Level of Care: Level of care: Telemetry Family Communication: none at bedside.    Disposition Plan:     Remains inpatient appropriate:  IV antibiotics.   Procedures:  none  Consultants:   None.   Antimicrobials:   Anti-infectives (From admission, onward)    Start     Dose/Rate Route Frequency Ordered Stop   06/30/22 1600  azithromycin (ZITHROMAX) 500 mg in sodium chloride 0.9 % 250 mL IVPB        500 mg 250 mL/hr over 60 Minutes Intravenous Every 24 hours 06/30/22 1548     06/30/22 1600  cefTRIAXone (ROCEPHIN) 1 g in sodium chloride 0.9 % 100 mL IVPB        1 g 200 mL/hr over 30 Minutes Intravenous Every 24 hours 06/30/22 1548          Medications  Scheduled Meds:  atorvastatin  10 mg Oral Daily   cyanocobalamin  1,000 mcg Oral Daily   enoxaparin (LOVENOX) injection  40 mg Subcutaneous Q24H   guaiFENesin  600 mg Oral BID   insulin aspart  0-15 Units Subcutaneous TID WC   ipratropium-albuterol  3 mL Nebulization BID   loratadine  10 mg Oral Daily  montelukast  10 mg Oral QHS   pantoprazole  40 mg Oral Daily   [START ON 07/04/2022] predniSONE  20 mg Oral Q breakfast   Followed by   Derrill Memo ON 07/08/2022] predniSONE  10 mg Oral Q breakfast   predniSONE  30 mg Oral Q breakfast   sertraline  50 mg Oral Daily   ursodiol  300 mg Oral BID   Continuous Infusions:  azithromycin 500 mg (06/30/22 1608)   cefTRIAXone (ROCEPHIN)  IV 1 g (06/30/22 2020)   lactated ringers 125 mL/hr at 07/01/22 0828   PRN Meds:.acetaminophen **OR** acetaminophen, albuterol, ondansetron **OR** ondansetron (ZOFRAN) IV    Subjective:   Saniyya Neri was seen and examined today.    Objective:   Vitals:   07/01/22 0100 07/01/22 0441 07/01/22 0755 07/01/22 1316  BP: 138/72 (!) 127/98  (!) 144/74  Pulse: 76 74  73  Resp:  18  16  Temp: 97.9 F (36.6 C) 98.1 F (36.7 C)  97.6 F (36.4 C)  TempSrc: Oral Oral  Oral  SpO2: 98% 98% 99% 100%  Weight: 86.5 kg     Height: '5\' 5"'$  (1.651 m)       Intake/Output Summary (Last 24 hours) at 07/01/2022 1640 Last data filed at  07/01/2022 1104 Gross per 24 hour  Intake 1873.4 ml  Output 2250 ml  Net -376.6 ml   Filed Weights   07/01/22 0100  Weight: 86.5 kg     Exam General: Alert and oriented x 3, NAD Cardiovascular: S1 S2 auscultated, no murmurs, RRR Respiratory: wheezing has improved. Scattered rales. Tachypnea.  Gastrointestinal: Soft, nontender, nondistended, + bowel sounds Ext: no pedal edema bilaterally Neuro: AAOx3, Cr N's II- XII. Strength 5/5 upper and lower extremities bilaterally Skin: No rashes Psych: Normal affect and demeanor, alert and oriented x3    Data Reviewed:  I have personally reviewed following labs and imaging studies   CBC Lab Results  Component Value Date   WBC 10.2 07/01/2022   RBC 3.66 (L) 07/01/2022   HGB 10.6 (L) 07/01/2022   HCT 33.7 (L) 07/01/2022   MCV 92.1 07/01/2022   MCH 29.0 07/01/2022   PLT 268 07/01/2022   MCHC 31.5 07/01/2022   RDW 13.2 07/01/2022   LYMPHSABS 2.0 07/01/2022   MONOABS 0.4 07/01/2022   EOSABS 0.0 07/01/2022   BASOSABS 0.0 [id number]     Last metabolic panel Lab Results  Component Value Date   NA 142 06/30/2022   K 3.5 06/30/2022   CL 112 (H) 06/30/2022   CO2 21 (L) 06/30/2022   BUN 16 06/30/2022   CREATININE 0.70 06/30/2022   GLUCOSE 88 06/30/2022   GFRNONAA >60 06/30/2022   GFRAA >60 02/08/2020   CALCIUM 7.2 (L) 06/30/2022   PHOS 3.2 06/30/2022   PROT 5.4 (L) 06/30/2022   ALBUMIN 2.9 (L) 06/30/2022   BILITOT 0.4 06/30/2022   ALKPHOS 82 06/30/2022   AST 24 06/30/2022   ALT 15 06/30/2022   ANIONGAP 9 06/30/2022    CBG (last 3)  Recent Labs    07/01/22 0717 07/01/22 1202 07/01/22 1618  GLUCAP 87 72 85      Coagulation Profile: No results for input(s): "INR", "PROTIME" in the last 168 hours.   Radiology Studies: DG Chest Port 1 View  Result Date: 06/30/2022 CLINICAL DATA:  Shortness of breath since this morning. History of non-small-cell lung cancer with radiation EXAM: PORTABLE CHEST 1 VIEW COMPARISON:   08/15/2021 x-ray. CT scan 02/06/2022. Older exams as well FINDINGS: Stable  posttreatment changes left lung apex. No consolidation, pneumothorax or effusion. Slight elevation of the right hemidiaphragm. Stable cardiopericardial silhouette without edema. Interstitial changes are likely chronic. Overlapping cardiac leads. IMPRESSION: Chronic changes. Posttreatment changes with fiduciary markers left upper lobe. Electronically Signed   By: Jill Side M.D.   On: 06/30/2022 12:35       Hosie Poisson M.D. [facility] 07/01/2022, 4:40 PM  Available via Epic secure chat 7am-7pm After 7 pm, please refer to night coverage provider listed on amion.

## 2022-07-02 DIAGNOSIS — J9621 Acute and chronic respiratory failure with hypoxia: Secondary | ICD-10-CM | POA: Diagnosis not present

## 2022-07-02 DIAGNOSIS — F419 Anxiety disorder, unspecified: Secondary | ICD-10-CM | POA: Diagnosis not present

## 2022-07-02 DIAGNOSIS — K219 Gastro-esophageal reflux disease without esophagitis: Secondary | ICD-10-CM | POA: Diagnosis not present

## 2022-07-02 DIAGNOSIS — J441 Chronic obstructive pulmonary disease with (acute) exacerbation: Secondary | ICD-10-CM | POA: Diagnosis not present

## 2022-07-02 LAB — CBC WITH DIFFERENTIAL/PLATELET
Abs Immature Granulocytes: 0.08 10*3/uL — ABNORMAL HIGH (ref 0.00–0.07)
Basophils Absolute: 0 10*3/uL (ref 0.0–0.1)
Basophils Relative: 0 %
Eosinophils Absolute: 0 10*3/uL (ref 0.0–0.5)
Eosinophils Relative: 0 %
HCT: 32.7 % — ABNORMAL LOW (ref 36.0–46.0)
Hemoglobin: 10.4 g/dL — ABNORMAL LOW (ref 12.0–15.0)
Immature Granulocytes: 1 %
Lymphocytes Relative: 35 %
Lymphs Abs: 4.1 10*3/uL — ABNORMAL HIGH (ref 0.7–4.0)
MCH: 29.1 pg (ref 26.0–34.0)
MCHC: 31.8 g/dL (ref 30.0–36.0)
MCV: 91.6 fL (ref 80.0–100.0)
Monocytes Absolute: 0.8 10*3/uL (ref 0.1–1.0)
Monocytes Relative: 7 %
Neutro Abs: 6.6 10*3/uL (ref 1.7–7.7)
Neutrophils Relative %: 57 %
Platelets: 257 10*3/uL (ref 150–400)
RBC: 3.57 MIL/uL — ABNORMAL LOW (ref 3.87–5.11)
RDW: 13.4 % (ref 11.5–15.5)
WBC: 11.5 10*3/uL — ABNORMAL HIGH (ref 4.0–10.5)
nRBC: 0 % (ref 0.0–0.2)

## 2022-07-02 LAB — GLUCOSE, CAPILLARY
Glucose-Capillary: 74 mg/dL (ref 70–99)
Glucose-Capillary: 75 mg/dL (ref 70–99)
Glucose-Capillary: 98 mg/dL (ref 70–99)

## 2022-07-02 MED ORDER — PREDNISONE 20 MG PO TABS: 20.0000 mg | ORAL_TABLET | Freq: Every day | ORAL | 0 refills | Status: AC

## 2022-07-02 MED ORDER — AZITHROMYCIN 250 MG PO TABS
500.0000 mg | ORAL_TABLET | Freq: Every day | ORAL | Status: DC
  Administered 2022-07-02: 500 mg via ORAL
  Filled 2022-07-02: qty 2

## 2022-07-02 MED ORDER — IPRATROPIUM-ALBUTEROL 0.5-2.5 (3) MG/3ML IN SOLN: 3.0000 mL | Freq: Two times a day (BID) | RESPIRATORY_TRACT | 2 refills | Status: DC

## 2022-07-02 MED ORDER — GUAIFENESIN-DM 100-10 MG/5ML PO SYRP: 10.0000 mL | ORAL_SOLUTION | ORAL | 0 refills | Status: DC | PRN

## 2022-07-02 MED ORDER — AMOXICILLIN-POT CLAVULANATE 875-125 MG PO TABS: 1.0000 | ORAL_TABLET | Freq: Two times a day (BID) | ORAL | 0 refills | Status: AC

## 2022-07-02 MED ORDER — PREDNISONE 10 MG PO TABS: 30.0000 mg | ORAL_TABLET | Freq: Every day | ORAL | 0 refills | Status: AC

## 2022-07-02 MED ORDER — PREDNISONE 10 MG PO TABS: 10.0000 mg | ORAL_TABLET | Freq: Every day | ORAL | 0 refills | Status: AC

## 2022-07-02 NOTE — Progress Notes (Signed)
PHARMACIST - PHYSICIAN COMMUNICATION DR:   Karleen Hampshire CONCERNING: Antibiotic IV to Oral Route Change Policy  RECOMMENDATION: This patient is receiving azithromycin  by the intravenous route.  Based on criteria approved by the Pharmacy and Therapeutics Committee, the antibiotic(s) is/are being converted to the equivalent oral dose form(s).   DESCRIPTION: These criteria include: Patient being treated for a respiratory tract infection. The patient is not neutropenic and does not exhibit a GI malabsorption state The patient is eating (either orally or via tube) and/or has been taking other orally administered medications for a least 24 hours The patient is improving clinically and has a Tmax < 100.5  If you have questions about this conversion, please contact the Pharmacy Department  '[]'$   [phone] )  [facility] '[]'$   [phone] )  [facility] '[]'$   [phone] )  [facility] '[]'$   [phone] )  [facility] '[x]'$   [phone] )  [place], PharmD, BCPS 07/02/2022 10:07 AM

## 2022-07-02 NOTE — Progress Notes (Signed)
Mobility Specialist - Progress Note  Pre-mobility: 73 bpm HR, 99% SpO2 During mobility: 113 bpm HR, 89% SpO2 Post-mobility: 100 bpm HR, 98% SPO2   07/02/22 0955  Oxygen Therapy  O2 Device Nasal Cannula  O2 Flow Rate (L/min) 3 L/min  Patient Activity (if Appropriate) Ambulating  Mobility  Activity Ambulated with assistance in hallway  Level of Assistance Standby assist, set-up cues, supervision of patient - no hands on  Assistive Device Front wheel walker  Distance Ambulated (ft) 220 ft  Range of Motion/Exercises Active  Activity Response Tolerated well  $Mobility charge 1 Mobility   Pt was found in bed and agreeable to ambulate. During session stated feeling a little SOB and checked SPO2 to be 89%. Encouraged pursed lip breathing during session and upon returning to room needed safety cues to sit. At EOS was left on recliner chair with necessities in reach and chair alarm on.  Ferd Hibbs Mobility Specialist

## 2022-07-02 NOTE — TOC Progression Note (Signed)
Transition of Care [facility]) - Progression Note    Patient Details  Name: Lori Butler MRN: [id number] Date of Birth: Jul 06, 1950  [facility]) CM/SW Contact  Servando Snare, [place] Phone Number: 07/02/2022, 10:47 AM  Clinical Narrative:    Transition of Care [facility]) Screening Note   Patient Details  Name: Lori Butler Date of Birth: 1951-03-29   Transition of Care [facility]) CM/SW Contact:    Servando Snare, LCSW Phone Number: 07/02/2022, 10:47 AM    Transition of Care Department [facility]) has reviewed patient and no TOC needs have been identified at this time. We will continue to monitor patient advancement through interdisciplinary progression rounds. If new patient transition needs arise, please place a TOC consult.           Expected Discharge Plan and Services                                               Social Determinants of Health (SDOH) Interventions SDOH Screenings   Food Insecurity: No Food Insecurity (06/30/2022)  Housing: Low Risk  (06/30/2022)  Transportation Needs: No Transportation Needs (06/30/2022)  Utilities: Not At Risk (06/30/2022)  Alcohol Screen: Low Risk  (02/26/2018)  Depression (PHQ2-9): Low Risk  (03/21/2022)  Financial Resource Strain: Medium Risk (12/27/2017)  Tobacco Use: Medium Risk (07/01/2022)    Readmission Risk Interventions     No data to display

## 2022-07-03 NOTE — Discharge Summary (Signed)
Physician Discharge Summary   Patient: Lori Butler MRN: [id number] DOB: 04-03-1951  Admit date:     06/30/2022  Discharge date: 07/02/2022  Discharge Physician: Hosie Poisson   PCP: [name], MD   Recommendations at discharge:  Please follow up with PCp in one week.  Please check cbc and bmp in one week.   Discharge Diagnoses: Principal Problem:   Acute on chronic respiratory failure (HCC) Active Problems:   Anxiety and depression   Acute exacerbation of COPD with asthma (HCC)   Seasonal allergic rhinitis   GERD (gastroesophageal reflux disease)   Hypokalemia   Lactic acidosis    Hospital Course:  Lori Butler is a 72 y.o. female with medical history significant of chronic respiratory failure on home oxygen at 2 LPM via [place], history of COVID-19 pneumonia, history of lung cancer, pulmonary nodule, COPD/asthma, seasonal allergic rhinitis, history of tobacco abuse in remission, anxiety, depression, history of suicidal attempt by overdosing with bupropion, nephrolithiasis, vitamin B12 deficiency, vitamin D deficiency who presented to the emergency department with complaints of progressively worsening dyspnea over the last 4 days associated with wheezing and nonproductive cough after she had a mild URI symptoms last week . Portable 1 view chest radiograph with posttreatment changes of the left lung apex.  Assessment and Plan:   Acute on chronic respiratory failure secondary to acute copd exacerbation and possible early pneumonia.  Started on  IV antibiotics and prednisone taper. Discharged on oral antibiotics and prednisone taper.  COVID negative.  Flu negative.  RVP is negative.  Lactic acid normalized.  Wbc count wnl.  Pro calcitonin wnl.  Follow up blood cultures.  Wean off oxygen.    COPD:  Wheezing has improved.      Tobacco abuse  In remission.    Hypokalemia:  Replaced.      Lactic acidosis;  Resolved.      Mild normocytic anemia:  Continue  to monitor.      Estimated body mass index is 31.73 kg/m as calculated from the following:   Height as of this encounter: '5\' 5"'$  (1.651 m).   Weight as of this encounter: 86.5 kg.        Consultants: none.  Procedures performed: none.   Disposition: Home Diet recommendation:  Discharge Diet Orders (From admission, onward)     Start     Ordered   07/02/22 0000  Diet - low sodium heart healthy        07/02/22 1503           Regular diet DISCHARGE MEDICATION: Allergies as of 07/02/2022       Reactions   Demerol [meperidine] Other (See Comments)   "horrible reaction"   Oxycodone-acetaminophen Other (See Comments)   Upset stomach    Tramadol Nausea And Vomiting        Medication List     STOP taking these medications    guaifenesin 400 MG Tabs tablet Commonly known as: HUMIBID E   guaiFENesin 600 MG 12 hr tablet Commonly known as: Mucinex       TAKE these medications    albuterol (2.5 MG/3ML) 0.083% nebulizer solution Commonly known as: PROVENTIL Take 3 mLs (2.5 mg total) by nebulization every 6 (six) hours as needed for wheezing or shortness of breath.   albuterol 108 (90 Base) MCG/ACT inhaler Commonly known as: VENTOLIN HFA Inhale 2 puffs into the lungs every 6 (six) hours as needed for wheezing or shortness of breath.   alendronate 70 MG tablet  Commonly known as: FOSAMAX Take 70 mg by mouth every Wednesday.   amoxicillin-clavulanate 875-125 MG tablet Commonly known as: AUGMENTIN Take 1 tablet by mouth 2 (two) times daily for 4 days.   atorvastatin 10 MG tablet Commonly known as: LIPITOR Take 10 mg by mouth daily.   fluticasone 50 MCG/ACT nasal spray Commonly known as: FLONASE Place 1-2 sprays into both nostrils daily.   guaiFENesin-dextromethorphan 100-10 MG/5ML syrup Commonly known as: ROBITUSSIN DM Take 10 mLs by mouth every 4 (four) hours as needed for cough.   ipratropium-albuterol 0.5-2.5 (3) MG/3ML Soln Commonly known as:  DUONEB Take 3 mLs by nebulization 2 (two) times daily.   IRON PO Take 325 mg by mouth daily.   loratadine 10 MG tablet Commonly known as: CLARITIN Take 1 tablet (10 mg total) by mouth daily.   montelukast 10 MG tablet Commonly known as: SINGULAIR Take 1 tablet (10 mg total) by mouth at bedtime.   pantoprazole 40 MG tablet Commonly known as: PROTONIX Take 1 tablet (40 mg total) by mouth 2 (two) times daily. What changed: when to take this   predniSONE 10 MG tablet Commonly known as: DELTASONE Take 3 tablets (30 mg total) by mouth daily with breakfast for 2 days. What changed:  how much to take how to take this when to take this additional instructions   predniSONE 20 MG tablet Commonly known as: DELTASONE Take 1 tablet (20 mg total) by mouth daily with breakfast for 4 days. Start taking on: July 04, 2022 What changed: You were already taking a medication with the same name, and this prescription was added. Make sure you understand how and when to take each.   predniSONE 10 MG tablet Commonly known as: DELTASONE Take 1 tablet (10 mg total) by mouth daily with breakfast for 3 days. Start taking on: July 08, 2022 What changed: You were already taking a medication with the same name, and this prescription was added. Make sure you understand how and when to take each.   sertraline 50 MG tablet Commonly known as: ZOLOFT Take 1 tablet (50 mg total) by mouth daily.   Trelegy Ellipta 200-62.5-25 MCG/ACT Aepb Generic drug: Fluticasone-Umeclidin-Vilant INHALE 1 PUFF INTO THE LUNGS EVERY DAY What changed:  how much to take how to take this when to take this additional instructions   ursodiol 250 MG tablet Commonly known as: ACTIGALL Take 250 mg by mouth 2 (two) times daily.   Vitamin B-12 5000 MCG Tbdp Take 5,000 mcg by mouth daily.   Vitamin D 50 MCG (2000 UT) tablet Take 2,000 Units by mouth daily.        Follow-up Information     [name], MD.  Schedule an appointment as soon as possible for a visit in 1 week(s).   Specialty: Family Medicine Contact information: [place] [phone]                Discharge Exam: Danley Danker Weights   07/01/22 0100  Weight: 86.5 kg   General exam: Appears calm and comfortable  Respiratory system: Clear to auscultation. Respiratory effort normal. Cardiovascular system: S1 & S2 heard, RRR. No JVD, murmurs, rubs, gallops or clicks. No pedal edema. Gastrointestinal system: Abdomen is nondistended, soft and nontender. No organomegaly or masses felt. Normal bowel sounds heard. Central nervous system: Alert and oriented. No focal neurological deficits. Extremities: Symmetric 5 x 5 power. Skin: No rashes, lesions or ulcers Psychiatry: Judgement and insight appear normal. Mood & affect appropriate.  Condition at discharge: fair  The results of significant diagnostics from this hospitalization (including imaging, microbiology, ancillary and laboratory) are listed below for reference.   Imaging Studies: DG Chest Port 1 View  Result Date: 06/30/2022 CLINICAL DATA:  Shortness of breath since this morning. History of non-small-cell lung cancer with radiation EXAM: PORTABLE CHEST 1 VIEW COMPARISON:  08/15/2021 x-ray. CT scan 02/06/2022. Older exams as well FINDINGS: Stable posttreatment changes left lung apex. No consolidation, pneumothorax or effusion. Slight elevation of the right hemidiaphragm. Stable cardiopericardial silhouette without edema. Interstitial changes are likely chronic. Overlapping cardiac leads. IMPRESSION: Chronic changes. Posttreatment changes with fiduciary markers left upper lobe. Electronically Signed   By: Jill Side M.D.   On: 06/30/2022 12:35    Microbiology: Results for orders placed or performed during the hospital encounter of 06/30/22  Resp panel by RT-PCR (RSV, Flu A&B, Covid) Anterior Nasal Swab     Status: None   Collection Time:  06/30/22 12:09 PM   Specimen: Anterior Nasal Swab  Result Value Ref Range Status   SARS Coronavirus 2 by RT PCR NEGATIVE NEGATIVE Final    Comment: (NOTE) SARS-CoV-2 target nucleic acids are NOT DETECTED.  The SARS-CoV-2 RNA is generally detectable in upper respiratory specimens during the acute phase of infection. The lowest concentration of SARS-CoV-2 viral copies this assay can detect is 138 copies/mL. A negative result does not preclude SARS-Cov-2 infection and should not be used as the sole basis for treatment or other patient management decisions. A negative result may occur with  improper specimen collection/handling, submission of specimen other than nasopharyngeal swab, presence of viral mutation(s) within the areas targeted by this assay, and inadequate number of viral copies(<138 copies/mL). A negative result must be combined with clinical observations, patient history, and epidemiological information. The expected result is Negative.  Fact Sheet for Patients:  EntrepreneurPulse.com.au  Fact Sheet for Healthcare Providers:  IncredibleEmployment.be  This test is no t yet approved or cleared by the [place] FDA and  has been authorized for detection and/or diagnosis of SARS-CoV-2 by FDA under an Emergency Use Authorization (EUA). This EUA will remain  in effect (meaning this test can be used) for the duration of the COVID-19 declaration under Section 564(b)(1) of the Act, 21 U.S.C.section 360bbb-3(b)(1), unless the authorization is terminated  or revoked sooner.       Influenza A by PCR NEGATIVE NEGATIVE Final   Influenza B by PCR NEGATIVE NEGATIVE Final    Comment: (NOTE) The Xpert Xpress SARS-CoV-2/FLU/RSV plus assay is intended as an aid in the diagnosis of influenza from Nasopharyngeal swab specimens and should not be used as a sole basis for treatment. Nasal washings and aspirates are unacceptable for Xpert Xpress  SARS-CoV-2/FLU/RSV testing.  Fact Sheet for Patients: EntrepreneurPulse.com.au  Fact Sheet for Healthcare Providers: IncredibleEmployment.be  This test is not yet approved or cleared by the [place] FDA and has been authorized for detection and/or diagnosis of SARS-CoV-2 by FDA under an Emergency Use Authorization (EUA). This EUA will remain in effect (meaning this test can be used) for the duration of the COVID-19 declaration under Section 564(b)(1) of the Act, 21 U.S.C. section 360bbb-3(b)(1), unless the authorization is terminated or revoked.     Resp Syncytial Virus by PCR NEGATIVE NEGATIVE Final    Comment: (NOTE) Fact Sheet for Patients: EntrepreneurPulse.com.au  Fact Sheet for Healthcare Providers: IncredibleEmployment.be  This test is not yet approved or cleared by the [place] FDA and has been authorized for detection and/or diagnosis  of SARS-CoV-2 by FDA under an Emergency Use Authorization (EUA). This EUA will remain in effect (meaning this test can be used) for the duration of the COVID-19 declaration under Section 564(b)(1) of the Act, 21 U.S.C. section 360bbb-3(b)(1), unless the authorization is terminated or revoked.  Performed at [facility], [place] [address], [place], [place] [address]   Respiratory (~20 pathogens) panel by PCR     Status: None   Collection Time: 06/30/22 12:09 PM   Specimen: Nasopharyngeal Swab; Respiratory  Result Value Ref Range Status   Adenovirus NOT DETECTED NOT DETECTED Final   Coronavirus 229E NOT DETECTED NOT DETECTED Final    Comment: (NOTE) The Coronavirus on the Respiratory Panel, DOES NOT test for the novel  Coronavirus (2019 nCoV)    Coronavirus HKU1 NOT DETECTED NOT DETECTED Final   Coronavirus NL63 NOT DETECTED NOT DETECTED Final   Coronavirus OC43 NOT DETECTED NOT DETECTED Final   Metapneumovirus NOT DETECTED NOT  DETECTED Final   Rhinovirus / Enterovirus NOT DETECTED NOT DETECTED Final   Influenza A NOT DETECTED NOT DETECTED Final   Influenza B NOT DETECTED NOT DETECTED Final   Parainfluenza Virus 1 NOT DETECTED NOT DETECTED Final   Parainfluenza Virus 2 NOT DETECTED NOT DETECTED Final   Parainfluenza Virus 3 NOT DETECTED NOT DETECTED Final   Parainfluenza Virus 4 NOT DETECTED NOT DETECTED Final   Respiratory Syncytial Virus NOT DETECTED NOT DETECTED Final   Bordetella pertussis NOT DETECTED NOT DETECTED Final   Bordetella Parapertussis NOT DETECTED NOT DETECTED Final   Chlamydophila pneumoniae NOT DETECTED NOT DETECTED Final   Mycoplasma pneumoniae NOT DETECTED NOT DETECTED Final    Comment: Performed at [facility], [place]. [address], [place], [place] [address]  Culture, blood (Routine X 2) w Reflex to ID Panel     Status: None (Preliminary result)   Collection Time: 06/30/22  1:00 PM   Specimen: BLOOD  Result Value Ref Range Status   Specimen Description   Final    BLOOD LEFT ANTECUBITAL Performed at [place] [address] [place], [place] [address]    Special Requests   Final    BOTTLES DRAWN AEROBIC AND ANAEROBIC Blood Culture adequate volume Performed at [facility] [address], [place], [place] [address]    Culture   Final    NO GROWTH 2 DAYS Performed at [place] [address] [place], [place] [address]    Report Status PENDING  Incomplete  Culture, blood (Routine X 2) w Reflex to ID Panel     Status: None (Preliminary result)   Collection Time: 06/30/22  1:12 PM   Specimen: BLOOD  Result Value Ref Range Status   Specimen Description   Final    BLOOD RIGHT ANTECUBITAL Performed at [facility] [address], [place], [place] [address]    Special Requests   Final    BOTTLES DRAWN AEROBIC AND ANAEROBIC Blood Culture adequate volume Performed at [facility] [address], [place], [place] [address]    Culture   Final    NO GROWTH 2 DAYS Performed at [facility] [address] [place], [place] [address]    Report Status PENDING  Incomplete    Labs: CBC: Recent Labs  Lab 06/30/22 1312 07/01/22 0536 07/02/22 0518  WBC 9.0 10.2 11.5*  NEUTROABS 7.6 7.8* 6.6  HGB 11.9* 10.6* 10.4*  HCT 37.0 33.7* 32.7*  MCV 90.9  92.1 91.6  PLT 287 268 [id number]   Basic Metabolic Panel: Recent Labs  Lab 06/30/22 1312 06/30/22 1807  NA 140 142  K 3.2* 3.5  CL 109 112*  CO2 21* 21*  GLUCOSE 200* 88  BUN 20 16  CREATININE 0.88 0.70  CALCIUM 8.4* 7.2*  MG  --  1.7  PHOS  --  3.2   Liver Function Tests: Recent Labs  Lab 06/30/22 1807  AST 24  ALT 15  ALKPHOS 82  BILITOT 0.4  PROT 5.4*  ALBUMIN 2.9*   CBG: Recent Labs  Lab 07/01/22 1618 07/01/22 2023 07/02/22 0718 07/02/22 1121 07/02/22 1620  GLUCAP 85 109* 74 75 98    Discharge time spent: 42 minutes.   Signed: Hosie Poisson, MD [facility] 07/03/2022

## 2022-07-06 LAB — CULTURE, BLOOD (ROUTINE X 2)
Culture: NO GROWTH
Culture: NO GROWTH
Special Requests: ADEQUATE
Special Requests: ADEQUATE

## 2022-07-12 DIAGNOSIS — Z23 Encounter for immunization: Secondary | ICD-10-CM | POA: Diagnosis not present

## 2022-07-12 DIAGNOSIS — J449 Chronic obstructive pulmonary disease, unspecified: Secondary | ICD-10-CM | POA: Diagnosis not present

## 2022-07-12 DIAGNOSIS — R6889 Other general symptoms and signs: Secondary | ICD-10-CM | POA: Diagnosis not present

## 2022-07-12 DIAGNOSIS — Z6830 Body mass index (BMI) 30.0-30.9, adult: Secondary | ICD-10-CM | POA: Diagnosis not present

## 2022-08-09 ENCOUNTER — Ambulatory Visit (HOSPITAL_COMMUNITY)
Admission: RE | Admit: 2022-08-09 | Discharge: 2022-08-09 | Disposition: A | Discharge status: Home / Self Care | Payer: Medicare HMO | Source: Ambulatory Visit | Attending: Radiation Oncology | Admitting: Radiation Oncology

## 2022-08-09 DIAGNOSIS — C3412 Malignant neoplasm of upper lobe, left bronchus or lung: Secondary | ICD-10-CM | POA: Diagnosis not present

## 2022-08-09 DIAGNOSIS — R911 Solitary pulmonary nodule: Secondary | ICD-10-CM | POA: Diagnosis not present

## 2022-08-09 DIAGNOSIS — R6889 Other general symptoms and signs: Secondary | ICD-10-CM | POA: Diagnosis not present

## 2022-08-09 DIAGNOSIS — J439 Emphysema, unspecified: Secondary | ICD-10-CM | POA: Diagnosis not present

## 2022-08-09 DIAGNOSIS — C3490 Malignant neoplasm of unspecified part of unspecified bronchus or lung: Secondary | ICD-10-CM | POA: Diagnosis not present

## 2022-08-09 MED ORDER — SODIUM CHLORIDE (PF) 0.9 % IJ SOLN
INTRAMUSCULAR | Status: AC
  Filled 2022-08-09: qty 50

## 2022-08-09 MED ORDER — IOHEXOL 300 MG/ML  SOLN
75.0000 mL | Freq: Once | INTRAMUSCULAR | Status: AC | PRN
  Administered 2022-08-09: 75 mL via INTRAVENOUS

## 2022-08-14 ENCOUNTER — Encounter: Payer: Self-pay | Admitting: Radiation Oncology

## 2022-08-14 ENCOUNTER — Ambulatory Visit
Admission: RE | Admit: 2022-08-14 | Discharge: 2022-08-14 | Disposition: A | Discharge status: Home / Self Care | Payer: Medicare HMO | Source: Ambulatory Visit | Attending: Radiation Oncology | Admitting: Radiation Oncology

## 2022-08-14 DIAGNOSIS — Z87891 Personal history of nicotine dependence: Secondary | ICD-10-CM | POA: Diagnosis not present

## 2022-08-14 DIAGNOSIS — C3412 Malignant neoplasm of upper lobe, left bronchus or lung: Secondary | ICD-10-CM

## 2022-08-14 NOTE — Progress Notes (Signed)
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 I verified patient's identity and began nursing interview. Patient reports Occasional SOB w/ exertion, well managed. No other issues conveyed at this time.   Meaningful use complete.   Patient aware of their 2:00pm- 08/14/2022 telephone appointment w/ Laurence Aly PA-C. I left my extension [phone] in case patient needs anything. Patient verbalized understanding. This concludes the nursing interview.   Patient contact [phone]     Ruel Favors, LPN

## 2022-08-14 NOTE — Progress Notes (Signed)
Radiation Oncology         [phone] ________________________________  Outpatient Follow Up - Conducted via telephone at patient request.  I spoke with the patient to conduct this consult visit via telephone. The patient was notified in advance and was offered an in person or telemedicine meeting to allow for face to face communication but instead preferred to proceed with a telephone visit.    Name: Lori Butler        MRN: [id number]  Date of Service: 08/14/2022 DOB: 17-Mar-1951  NW:[id number]:Miller, Misty StanleyLisa, MD  Sigmund HazelMiller, Lisa, MD     REFERRING PHYSICIAN: Sigmund HazelMiller, Lisa, MD   DIAGNOSIS: There were no encounter diagnoses.   HISTORY OF PRESENT ILLNESS: Lori BoerKathy Lynn Butler is a 72 y.o. female with a history of  Putative Stage IA2, cT1bN0M0 NSCLC of the LUL. She has oxygen dependant (2L [place]) COPD and Asthma and was sent for lung cancer screening scan on 10/14/2018 which revealed a 1.26 cm lesion in the left upper lobe, as well as a 3.6 mm lesion in the left lower lobe. No adenopathy was noted. She has a low attenuation lesion in the liver measuring 1.3 cm  And this was felt to be stable in comparison to prior studies. She underwent a PET scan on 10/18/2018 that revealed an SUV of 8.4 in the left upper lobe lesion that was seen on CT and measured 1.3 cm on PET. She did not have additional hypermetabolic changes in the other nodule or in thoracic nodes. The liver did not show metabolic change and was favored as a cyst. She did undergo bronchoscopy on 11/11/2018 which revealed atypical cells in her biopsy and cytology specimens without definitive carcinoma. She decided against surgical resection and proceeded with SBRT in the summer of 2020. She has been followed in surveillance since and has been without recurrent disease.   She was recently hospitalized and treated empiorically for what sounds like pneumonia in February 2024 though no infltrates were described on her CXR at the time. Her most recent CT on 08/09/22  showed stable post treatment changes in the LUL, and stable 3 mm nodule in the LLL that we are following. She also had a small pericardial effusion which was new. She continues to have stigmata of atherosclerosis of the aorta, emphysema, and a stable cystic structure in the left lobe of the liver. She's contacted today by phone to review her results.   PREVIOUS RADIATION THERAPY:    12/03/2018-12/10/2018 SBRT Treatment: The patient was treated to the LUL lung with a course of stereotactic body radiation treatment.  The patient received 54 Gray in 3 fractions using a IMRT technique, with 3 fields.   PAST MEDICAL HISTORY:  Past Medical History:  Diagnosis Date   Acute respiratory failure with hypoxia    Anxiety    Anxiety and depression 05/05/2018   Aortic atherosclerosis    Arthritis    "a little in my hands probably" (10/31/2017)   Asthma    Attempted suicide 02/21/2018   Cellulitis of right leg 02/28/2019   Chronic respiratory failure with hypoxia 03/05/2020   COPD (chronic obstructive pulmonary disease)    COPD exacerbation    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   FEV1 53%  Clinically seems to have a component of asthma  - alpha one AT   03/01/2019  =  MM level   196   Depression    History of kidney stones    lung ca dx'd 10/2018   Pneumonia due to  COVID-19 virus 02/04/2020   Seasonal allergic rhinitis 08/15/2021   Solitary pulmonary nodule on lung CT 10/22/2018   Suicide attempt 02/21/2018   overdosed on Wellbutrin   Tobacco abuse 09/20/2017   Vitamin B12 deficiency 11/10/2017   Vitamin D deficiency 11/10/2017       PAST SURGICAL HISTORY: Past Surgical History:  Procedure Laterality Date   ABDOMINAL HYSTERECTOMY     APPENDECTOMY     BREAST BIOPSY Left 1990s   CATARACT EXTRACTION W/ INTRAOCULAR LENS  IMPLANT, BILATERAL Bilateral    DILATION AND CURETTAGE OF UTERUS     FUDUCIAL PLACEMENT N/A 11/11/2018   Procedure: PLACEMENT OF FUDUCIAL;  Surgeon: Loreli Slot, MD;   Location: [facility] OR;  Service: Thoracic;  Laterality: N/A;   TONSILLECTOMY     TUBAL LIGATION     VIDEO BRONCHOSCOPY WITH ENDOBRONCHIAL NAVIGATION N/A 11/11/2018   Procedure: VIDEO BRONCHOSCOPY WITH ENDOBRONCHIAL NAVIGATION;  Surgeon: Loreli Slot, MD;  Location: [facility] OR;  Service: Thoracic;  Laterality: N/A;     FAMILY HISTORY:  Family History  Problem Relation Age of Onset   Breast cancer Mother    Lung cancer Sister    Colon cancer Sister    Bone cancer Brother      SOCIAL HISTORY:  reports that she quit smoking about 4 years ago. Her smoking use included cigarettes. She has a 76.50 pack-year smoking history. She has never used smokeless tobacco. She reports that she does not currently use alcohol. She reports that she does not use drugs. The patient is divorced and lives in [place].     ALLERGIES: Demerol [meperidine], Oxycodone-acetaminophen, and Tramadol   MEDICATIONS:  Current Outpatient Medications  Medication Sig Dispense Refill   albuterol (PROVENTIL) (2.5 MG/3ML) 0.083% nebulizer solution Take 3 mLs (2.5 mg total) by nebulization every 6 (six) hours as needed for wheezing or shortness of breath. 75 mL 6   albuterol (VENTOLIN HFA) 108 (90 Base) MCG/ACT inhaler Inhale 2 puffs into the lungs every 6 (six) hours as needed for wheezing or shortness of breath. 1 each 2   alendronate (FOSAMAX) 70 MG tablet Take 70 mg by mouth every Wednesday.      atorvastatin (LIPITOR) 10 MG tablet Take 10 mg by mouth daily.     Cholecalciferol (VITAMIN D) 50 MCG (2000 UT) tablet Take 2,000 Units by mouth daily.     Cyanocobalamin (VITAMIN B-12) 5000 MCG TBDP Take 5,000 mcg by mouth daily.     Ferrous Sulfate (IRON PO) Take 325 mg by mouth daily.     fluticasone (FLONASE) 50 MCG/ACT nasal spray Place 1-2 sprays into both nostrils daily. 48 g 3   Fluticasone-Umeclidin-Vilant (TRELEGY ELLIPTA) 200-62.5-25 MCG/ACT AEPB INHALE 1 PUFF INTO THE LUNGS EVERY DAY (Patient taking differently: Take 1  puff by mouth daily.) 180 each 3   guaiFENesin-dextromethorphan (ROBITUSSIN DM) 100-10 MG/5ML syrup Take 10 mLs by mouth every 4 (four) hours as needed for cough. 118 mL 0   ipratropium-albuterol (DUONEB) 0.5-2.5 (3) MG/3ML SOLN Take 3 mLs by nebulization 2 (two) times daily. 360 mL 2   loratadine (CLARITIN) 10 MG tablet Take 1 tablet (10 mg total) by mouth daily. 90 tablet 3   montelukast (SINGULAIR) 10 MG tablet Take 1 tablet (10 mg total) by mouth at bedtime. 30 tablet 1   pantoprazole (PROTONIX) 40 MG tablet Take 1 tablet (40 mg total) by mouth 2 (two) times daily. (Patient taking differently: Take 40 mg by mouth daily.) 60 tablet 1   sertraline (ZOLOFT)  50 MG tablet Take 1 tablet (50 mg total) by mouth daily. 30 tablet 0   ursodiol (ACTIGALL) 250 MG tablet Take 250 mg by mouth 2 (two) times daily.     No current facility-administered medications for this encounter.     REVIEW OF SYSTEMS: On review of systems, the patient reports that she is doing pretty well. She continues to have coughing and is taking her trelegy, as well as mucinex, claritin, singulair, and flonase. She describes a whitish phlegm without other color changes or hemoptysis.  She continues to have shortness of breath with exertion, and O2 demands remain stable at 2L [place] at home.  No complaints of chest pain, shortness of breath, cough are verbalized.      PHYSICAL EXAM:  Unable to assess due to encounter type.    ECOG = 1  0 - Asymptomatic (Fully active, able to carry on all predisease activities without restriction)  1 - Symptomatic but completely ambulatory (Restricted in physically strenuous activity but ambulatory and able to carry out work of a light or sedentary nature. For example, light housework, office work)  2 - Symptomatic, <50% in bed during the day (Ambulatory and capable of all self care but unable to carry out any work activities. Up and about more than 50% of waking hours)  3 - Symptomatic, >50% in  bed, but not bedbound (Capable of only limited self-care, confined to bed or chair 50% or more of waking hours)  4 - Bedbound (Completely disabled. Cannot carry on any self-care. Totally confined to bed or chair)  5 - Death   Santiago Glad MM, Creech RH, Tormey DC, et al. [phone]). "Toxicity and response criteria of the [facility]". Am. Evlyn Clines. Oncol. 5 (6): 649-55    LABORATORY DATA:  Lab Results  Component Value Date   WBC 11.5 (H) 07/02/2022   HGB 10.4 (L) 07/02/2022   HCT 32.7 (L) 07/02/2022   MCV 91.6 07/02/2022   PLT 257 07/02/2022   Lab Results  Component Value Date   NA 142 06/30/2022   K 3.5 06/30/2022   CL 112 (H) 06/30/2022   CO2 21 (L) 06/30/2022   Lab Results  Component Value Date   ALT 15 06/30/2022   AST 24 06/30/2022   ALKPHOS 82 06/30/2022   BILITOT 0.4 06/30/2022      RADIOGRAPHY: CT CHEST W CONTRAST  Result Date: 08/09/2022 CLINICAL DATA:  Non-small-cell lung cancer. Status post radiation therapy * Tracking Code: BO * EXAM: CT CHEST WITH CONTRAST TECHNIQUE: Multidetector CT imaging of the chest was performed during intravenous contrast administration. RADIATION DOSE REDUCTION: This exam was performed according to the departmental dose-optimization program which includes automated exposure control, adjustment of the mA and/or kV according to patient size and/or use of iterative reconstruction technique. CONTRAST:  72mL OMNIPAQUE IOHEXOL 300 MG/ML  SOLN COMPARISON:  CT 02/06/2022 and older FINDINGS: Cardiovascular: Small pericardial effusion. Similar to previous. Heart is nonenlarged. Coronary artery calcifications are seen. Normal caliber thoracic aorta with some scattered vascular calcifications. Mediastinum/Nodes: No specific abnormal lymph node enlargement seen in the axillary region, hilum or mediastinum. Small hiatal hernia. Lungs/Pleura: Medial anterior left upper lobe is a focal mass lesion once again with a fiduciary marker along its lateral  margin. Lesion previously measured 3.0 x 1.7 cm and today on series 5 image 27 measures 2.7 x 1.9 cm, similar when adjusting for technique. There is also a second marker anterior to the lesion. There is confluence tissue extending down  to the left hilum. 3 mm subpleural nodule left lower lobe on series 5, image 95 is stable. There is nodular right apical pleural thickening with scarring and retraction. This is unchanged from previous examination. Diffuse centrilobular emphysematous lung changes are identified bilaterally. No pleural effusion or pneumothorax. No new consolidation. Upper Abdomen: Fatty liver infiltration identified. Slight thickening of the left adrenal gland is stable. Segment 4 hepatic cyst is stable. Musculoskeletal: Mild degenerative changes along the spine. IMPRESSION: Stable left upper lobe mass with the adjacent fiduciary marker. Fatty liver infiltration. Emphysematous lung changes identified. Stable right apical pleural thickening and scarring. No new mass lesion or lymph node enlargement seen in the thorax. Aortic Atherosclerosis (ICD10-I70.0) and Emphysema (ICD10-J43.9). Electronically Signed   By: Karen Kays M.D.   On: 08/09/2022 12:55        IMPRESSION/PLAN: 1. Putative Stage IA2, cT1bN0M0 NSCLC of the LUL. We reviewed her stable results from her most recent CT scan last week. We discussed the recommendations to continue serial CT scans with her next in 6 month's time per NCCN guidelines. She is in agreement and will notify me sooner if she has questions or concerns.   2. LLL nodule. This was read again as stable today, and we will follow up on subsequent visits to make sure no new or suspicious nodules need to be followed or treated. 3. COPD/Asthma. The patient continues to follow up with pulmonary medicine, as she uses oxygen at 2L [place]. We appreciate their help in continuing to maximize her lung function.  4. Small Pericardial Effusion. The patient may have these findings  related to her recent infection, but I will copy her PCP as well to determine if she needs additional work up.    This encounter was conducted via telephone.  The patient has provided two factor identification and has given verbal consent for this type of encounter and has been advised to only accept a meeting of this type in a secure network environment. The time spent during this encounter was 35 minutes including preparation, discussion, and coordination of the patient's care. The attendants for this meeting include Ronny Bacon  and [facility] [place].  During the encounter, Ronny Bacon was located at [facility] Radiation Oncology Department.  Lei Michela Pfeifer was located at home.     Osker Mason, PAC

## 2022-08-22 ENCOUNTER — Other Ambulatory Visit: Payer: Self-pay | Admitting: Nurse Practitioner

## 2022-08-22 DIAGNOSIS — J302 Other seasonal allergic rhinitis: Secondary | ICD-10-CM

## 2022-10-23 DIAGNOSIS — Z1389 Encounter for screening for other disorder: Secondary | ICD-10-CM | POA: Diagnosis not present

## 2022-10-23 DIAGNOSIS — R6889 Other general symptoms and signs: Secondary | ICD-10-CM | POA: Diagnosis not present

## 2022-10-23 DIAGNOSIS — Z6841 Body Mass Index (BMI) 40.0 and over, adult: Secondary | ICD-10-CM | POA: Diagnosis not present

## 2022-10-23 DIAGNOSIS — Z0000 Encounter for general adult medical examination without abnormal findings: Secondary | ICD-10-CM | POA: Diagnosis not present

## 2022-10-25 DIAGNOSIS — R6889 Other general symptoms and signs: Secondary | ICD-10-CM | POA: Diagnosis not present

## 2022-10-25 DIAGNOSIS — F325 Major depressive disorder, single episode, in full remission: Secondary | ICD-10-CM | POA: Diagnosis not present

## 2022-10-25 DIAGNOSIS — Z87891 Personal history of nicotine dependence: Secondary | ICD-10-CM | POA: Diagnosis not present

## 2022-10-25 DIAGNOSIS — I700 Atherosclerosis of aorta: Secondary | ICD-10-CM | POA: Diagnosis not present

## 2022-10-25 DIAGNOSIS — K743 Primary biliary cirrhosis: Secondary | ICD-10-CM | POA: Diagnosis not present

## 2022-10-25 DIAGNOSIS — J9611 Chronic respiratory failure with hypoxia: Secondary | ICD-10-CM | POA: Diagnosis not present

## 2022-10-25 DIAGNOSIS — K219 Gastro-esophageal reflux disease without esophagitis: Secondary | ICD-10-CM | POA: Diagnosis not present

## 2022-10-25 DIAGNOSIS — J439 Emphysema, unspecified: Secondary | ICD-10-CM | POA: Diagnosis not present

## 2022-10-25 DIAGNOSIS — M810 Age-related osteoporosis without current pathological fracture: Secondary | ICD-10-CM | POA: Diagnosis not present

## 2022-10-25 DIAGNOSIS — E7800 Pure hypercholesterolemia, unspecified: Secondary | ICD-10-CM | POA: Diagnosis not present

## 2022-10-27 ENCOUNTER — Ambulatory Visit
Admission: RE | Admit: 2022-10-27 | Discharge: 2022-10-27 | Disposition: A | Discharge status: Home / Self Care | Payer: Medicare HMO | Source: Ambulatory Visit | Attending: Family Medicine | Admitting: Family Medicine

## 2022-10-27 ENCOUNTER — Other Ambulatory Visit: Payer: Self-pay

## 2022-10-27 DIAGNOSIS — M810 Age-related osteoporosis without current pathological fracture: Secondary | ICD-10-CM

## 2022-10-27 DIAGNOSIS — Z1231 Encounter for screening mammogram for malignant neoplasm of breast: Secondary | ICD-10-CM

## 2022-10-27 DIAGNOSIS — R6889 Other general symptoms and signs: Secondary | ICD-10-CM | POA: Diagnosis not present

## 2022-10-27 DIAGNOSIS — N958 Other specified menopausal and perimenopausal disorders: Secondary | ICD-10-CM | POA: Diagnosis not present

## 2022-10-27 DIAGNOSIS — E349 Endocrine disorder, unspecified: Secondary | ICD-10-CM | POA: Diagnosis not present

## 2022-11-14 DIAGNOSIS — Z1211 Encounter for screening for malignant neoplasm of colon: Secondary | ICD-10-CM | POA: Diagnosis not present

## 2022-12-28 ENCOUNTER — Ambulatory Visit (HOSPITAL_BASED_OUTPATIENT_CLINIC_OR_DEPARTMENT_OTHER): Payer: Medicare HMO | Admitting: Pulmonary Disease

## 2022-12-28 ENCOUNTER — Encounter (HOSPITAL_BASED_OUTPATIENT_CLINIC_OR_DEPARTMENT_OTHER): Payer: Self-pay | Admitting: Pulmonary Disease

## 2022-12-28 VITALS — BP 128/88 | HR 102 | Ht 65.0 in | Wt 177.0 lb

## 2022-12-28 DIAGNOSIS — R6889 Other general symptoms and signs: Secondary | ICD-10-CM | POA: Diagnosis not present

## 2022-12-28 DIAGNOSIS — R911 Solitary pulmonary nodule: Secondary | ICD-10-CM | POA: Diagnosis not present

## 2022-12-28 DIAGNOSIS — J4489 Other specified chronic obstructive pulmonary disease: Secondary | ICD-10-CM | POA: Diagnosis not present

## 2022-12-28 DIAGNOSIS — J9611 Chronic respiratory failure with hypoxia: Secondary | ICD-10-CM

## 2022-12-28 NOTE — Assessment & Plan Note (Signed)
Continue Trelegy. Use albuterol for rescue. We again discussed COPD action plan and signs and symptoms of COPD exacerbation. In the past she has had sudden exacerbations suggestive of asthma component and prednisone has helped

## 2022-12-28 NOTE — Progress Notes (Signed)
   Subjective:    Patient ID: Lori Butler, female    DOB: [birth year], 72 y.o.   MRN: [id number]  HPI  72 yo ex- smoker for FU of moderate COPD/ asthma, chronic resp failure with hypoxia & lung cancer   She quit smoking 09/2017 Sudden onset and variability suggests a component of asthma   She had 2 hospitalizations 12/2017 for acute hypercarbic respiratory failure with change in mental status.  Each time she improved quickly and did not require oxygen on discharge   She underwent navigation biopsy of left upper lobe nodule showing atypical cells and then underwent SBRT 12/2018  29-month follow-up visit. She ambulates with a walker and arrives on oxygen which she has been using 24/7. Last use of prednisone was 6 months ago when she called.  That was also the last time she used albuterol nebs. She reports occasional chest tightness under both breasts. She has noted blood in her stool and colonoscopy is planned. We reviewed last CT scan from April 2024   Significant tests/ events reviewed  CT chest w con 08/2022 >>  unchanged LUL scarring    CT chest 10/14/2018 -bilateral upper lobe parenchymal scarring on her prior CT and left upper lobe scar that appeared more nodular on the CT measuring about 13 mm PET scan showed hypermetabolism with SUV 8 in this nodule     PFTs 10/2018 showed moderate reversible airway obstruction with FEV1 of 37%-0.91 which improved to 47%-1.1L. DLCO was about 54%     PFTs 10/2017 -moderate airway obstruction, ratio 59, FEV1 51%, FVC 65%, no significant BD response, DLCO 57%   Review of Systems  neg for any significant sore throat, dysphagia, itching, sneezing, nasal congestion or excess/ purulent secretions, fever, chills, sweats, unintended wt loss, pleuritic or exertional cp, hempoptysis, orthopnea pnd or change in chronic leg swelling. Also denies presyncope, palpitations, heartburn, abdominal pain, nausea, vomiting, diarrhea or change in bowel or urinary  habits, dysuria,hematuria, rash, arthralgias, visual complaints, headache, numbness weakness or ataxia.     Objective:   Physical Exam  Gen. Pleasant, obese, in no distress, elderly on O2 ENT - no lesions, no post nasal drip Neck: No JVD, no thyromegaly, no carotid bruits Lungs: no use of accessory muscles, no dullness to percussion, decreased without rales or rhonchi  Cardiovascular: Rhythm regular, heart sounds  normal, no murmurs or gallops, no peripheral edema Musculoskeletal: No deformities, no cyanosis or clubbing , no tremors       Assessment & Plan:

## 2022-12-28 NOTE — Assessment & Plan Note (Signed)
Continue oxygen 24/7 She is cleared for endoscopy procedure with the risk

## 2022-12-28 NOTE — Assessment & Plan Note (Signed)
Status post SBRT in 2020 with residual scarring.  She undergoes 6 monthly CT surveillance

## 2022-12-28 NOTE — Patient Instructions (Signed)
Good luck with colonoscopy procedure !  Refills on trelegy as needed

## 2023-01-24 ENCOUNTER — Telehealth: Payer: Self-pay | Admitting: *Deleted

## 2023-01-24 NOTE — Telephone Encounter (Signed)
CALLED PATIENT TO INFORM OF CT FOR 02-13-23- ARRIVAL TIME- 3:15 PM, NO RESTRICTIONS TO SCAN, PATIENT TO RECEIVE RESULTS FROM ALISON PERKINS ON 02-19-23 @ 2 PM VIA TELEPHONE, SPOKE WITH PATIENT AND SHE IS AWARE OF THESE APPTS. AND THE INSTRUCTIONS

## 2023-01-29 DIAGNOSIS — J449 Chronic obstructive pulmonary disease, unspecified: Secondary | ICD-10-CM | POA: Diagnosis not present

## 2023-01-29 DIAGNOSIS — K921 Melena: Secondary | ICD-10-CM | POA: Diagnosis not present

## 2023-01-29 DIAGNOSIS — R6889 Other general symptoms and signs: Secondary | ICD-10-CM | POA: Diagnosis not present

## 2023-02-01 ENCOUNTER — Telehealth: Payer: Self-pay | Admitting: *Deleted

## 2023-02-01 NOTE — Telephone Encounter (Signed)
Fax received from Dr. Willis Modena with [facility] to perform a Colonoscopy on patient.  Patient needs surgery clearance. Surgery is TBD. Patient was seen on 12/28/22. Office protocol is a risk assessment can be sent to surgeon if patient has been seen in 60 days or less.   During visit with Dr. Vassie Loll on 12/28/22 clearance was given. I faxed this note to Dr. Hulen Shouts office at [phone].

## 2023-02-02 ENCOUNTER — Other Ambulatory Visit: Payer: Self-pay | Admitting: Gastroenterology

## 2023-02-13 ENCOUNTER — Ambulatory Visit (HOSPITAL_COMMUNITY)
Admission: RE | Admit: 2023-02-13 | Discharge: 2023-02-13 | Disposition: A | Discharge status: Home / Self Care | Payer: Medicare HMO | Source: Ambulatory Visit | Attending: Radiation Oncology | Admitting: Radiation Oncology

## 2023-02-13 DIAGNOSIS — C3412 Malignant neoplasm of upper lobe, left bronchus or lung: Secondary | ICD-10-CM | POA: Insufficient documentation

## 2023-02-13 DIAGNOSIS — I3139 Other pericardial effusion (noninflammatory): Secondary | ICD-10-CM | POA: Diagnosis not present

## 2023-02-13 DIAGNOSIS — J439 Emphysema, unspecified: Secondary | ICD-10-CM | POA: Diagnosis not present

## 2023-02-13 DIAGNOSIS — C3490 Malignant neoplasm of unspecified part of unspecified bronchus or lung: Secondary | ICD-10-CM | POA: Diagnosis not present

## 2023-02-13 DIAGNOSIS — R6889 Other general symptoms and signs: Secondary | ICD-10-CM | POA: Diagnosis not present

## 2023-02-13 LAB — POCT I-STAT CREATININE: Creatinine, Ser: 0.6 mg/dL (ref 0.44–1.00)

## 2023-02-13 MED ORDER — IOHEXOL 300 MG/ML  SOLN
75.0000 mL | Freq: Once | INTRAMUSCULAR | Status: AC | PRN
  Administered 2023-02-13: 75 mL via INTRAVENOUS

## 2023-02-13 MED ORDER — SODIUM CHLORIDE (PF) 0.9 % IJ SOLN
INTRAMUSCULAR | Status: AC
  Filled 2023-02-13: qty 50

## 2023-02-19 ENCOUNTER — Encounter: Payer: Self-pay | Admitting: Radiation Oncology

## 2023-02-19 ENCOUNTER — Ambulatory Visit
Admission: RE | Admit: 2023-02-19 | Discharge: 2023-02-19 | Disposition: A | Discharge status: Home / Self Care | Payer: Medicare HMO | Source: Ambulatory Visit | Attending: Radiation Oncology | Admitting: Radiation Oncology

## 2023-02-19 DIAGNOSIS — C3412 Malignant neoplasm of upper lobe, left bronchus or lung: Secondary | ICD-10-CM | POA: Diagnosis not present

## 2023-02-19 DIAGNOSIS — Z87891 Personal history of nicotine dependence: Secondary | ICD-10-CM | POA: Diagnosis not present

## 2023-02-19 NOTE — Progress Notes (Signed)
Telephone nursing appointment for review of most recent scan results. I verified patient's identity x2 and began nursing interview.   Patient reports doing well in reference to her lungs. Patient denies any related issues at this time.   Meaningful use complete.   Patient aware of their 2pm telephone appointment w/ Laurence Aly PA-C. I left my extension [phone] in case patient needs anything. Patient verbalized understanding. This concludes the nursing interview.   Patient contact [phone]     Ruel Favors, LPN

## 2023-02-19 NOTE — Progress Notes (Signed)
Radiation Oncology         [phone] ________________________________  Outpatient Follow Up - Conducted via telephone at patient request.  I spoke with the patient to conduct this consult visit via telephone. The patient was notified in advance and was offered an in person or telemedicine meeting to allow for face to face communication but instead preferred to proceed with a telephone visit.    Name: Lori Butler        MRN: [id number]  Date of Service: 02/19/2023 DOB: [birth year]  ON:GEXBMW, Misty Stanley, MD  Sigmund Hazel, MD     REFERRING PHYSICIAN: Sigmund Hazel, MD   DIAGNOSIS: The encounter diagnosis was Malignant neoplasm of upper lobe of left lung [facility]).   HISTORY OF PRESENT ILLNESS: Lori Butler is a 72 y.o. female with a history of  Putative Stage IA2, cT1bN0M0 NSCLC of the LUL. She has oxygen dependant (2L [place]) COPD and Asthma and was sent for lung cancer screening scan on 10/14/2018 which revealed a 1.26 cm lesion in the left upper lobe, as well as a 3.6 mm lesion in the left lower lobe. No adenopathy was noted. She has a low attenuation lesion in the liver measuring 1.3 cm  And this was felt to be stable in comparison to prior studies. She underwent a PET scan on 10/18/2018 that revealed an SUV of 8.4 in the left upper lobe lesion that was seen on CT and measured 1.3 cm on PET. She did not have additional hypermetabolic changes in the other nodule or in thoracic nodes. The liver did not show metabolic change and was favored as a cyst. She did undergo bronchoscopy on 11/11/2018 which revealed atypical cells in her biopsy and cytology specimens without definitive carcinoma. She decided against surgical resection and proceeded with SBRT in the summer of 2020. She has been followed in surveillance since and has been without recurrent disease.   The patient has continued to have stability in her scans including her most recent from 02/13/2023 that showed the treated area in the left  upper lobe measuring 25 x 21 mm overall stable in comparison to prior with surrounding radiation changes and fiducial markers again demonstrated.  A stable subpleural 4 mm nodule in the left lung base was noted as well as stable dense right apical pleural and parenchymal scarring, she has no evidence of adenopathy, and a small pericardial effusion which has been a stable finding.  She is scheduled to undergo a colonoscopy on 03/14/23 with general anesthesia with Dr. Dulce Sellar due to finding blood in her stool.  She recently saw Dr. Vassie Loll for cardiac clearance.  She continues on 2 L of oxygen continuously.  PREVIOUS RADIATION THERAPY:   12/03/2018-12/10/2018 SBRT Treatment: The patient was treated to the LUL lung with a course of stereotactic body radiation treatment.  The patient received 54 Gray in 3 fractions using a IMRT technique, with 3 fields.   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   FEV1  53%  Clinically seems to have a component of asthma  - alpha one AT   03/01/2019  =  MM level   196   Depression    History of kidney stones    lung ca dx'd 10/2018   Pneumonia due to COVID-19 virus 02/04/2020   Seasonal allergic rhinitis 08/15/2021   Solitary pulmonary nodule on lung CT 10/22/2018   Suicide attempt (HCC) 02/21/2018   overdosed on Wellbutrin   Tobacco abuse 09/20/2017   Vitamin B12 deficiency 11/10/2017   Vitamin D deficiency 11/10/2017       PAST SURGICAL HISTORY: Past Surgical History:  Procedure Laterality Date    ABDOMINAL HYSTERECTOMY     APPENDECTOMY     BREAST BIOPSY Left 1990s   CATARACT EXTRACTION W/ INTRAOCULAR LENS  IMPLANT, BILATERAL Bilateral    DILATION AND CURETTAGE OF UTERUS     FUDUCIAL PLACEMENT N/A 11/11/2018   Procedure: PLACEMENT OF FUDUCIAL;  Surgeon: Loreli Slot, MD;  Location: [facility] OR;  Service: Thoracic;  Laterality: N/A;   TONSILLECTOMY     TUBAL LIGATION     VIDEO BRONCHOSCOPY WITH ENDOBRONCHIAL NAVIGATION N/A 11/11/2018   Procedure: VIDEO BRONCHOSCOPY WITH ENDOBRONCHIAL NAVIGATION;  Surgeon: Loreli Slot, MD;  Location: [facility] OR;  Service: Thoracic;  Laterality: N/A;     FAMILY HISTORY:  Family History  Problem Relation Age of Onset   Breast cancer Mother    Lung cancer Sister    Colon cancer Sister    Bone cancer Brother      SOCIAL HISTORY:  reports that she quit smoking about 5 years ago. Her smoking use included cigarettes. She started smoking about 56 years ago. She has a 76.5 pack-year smoking history. She has never used smokeless tobacco. She reports that she does not currently use alcohol. She reports that she does not use drugs. The patient is divorced and lives in [place].     ALLERGIES: Demerol [meperidine], Oxycodone-acetaminophen, and Tramadol   MEDICATIONS:  Current Outpatient Medications  Medication Sig Dispense Refill   albuterol (PROVENTIL) (2.5 MG/3ML) 0.083% nebulizer solution Take 3 mLs (2.5 mg total) by nebulization every 6 (six) hours as needed for wheezing or shortness of breath. 75 mL 6   albuterol (VENTOLIN HFA) 108 (90 Base) MCG/ACT inhaler Inhale 2 puffs into the lungs every 6 (six) hours as needed for wheezing or shortness of breath. 1 each 2   alendronate (FOSAMAX) 70 MG tablet Take 70 mg by mouth every Wednesday.      atorvastatin (LIPITOR) 10 MG tablet Take 10 mg by mouth daily.     Cholecalciferol (VITAMIN D) 50 MCG (2000 UT) tablet Take 2,000 Units by mouth daily.     Cyanocobalamin (VITAMIN B-12) 5000 MCG TBDP Take  5,000 mcg by mouth daily.     Ferrous Sulfate (IRON PO) Take 325 mg by mouth daily.     fluticasone (FLONASE) 50 MCG/ACT nasal spray USE 1 TO 2 SPRAYS IN EACH NOSTRIL EVERY DAY 48 g 3   Fluticasone-Umeclidin-Vilant (TRELEGY ELLIPTA) 200-62.5-25 MCG/ACT AEPB INHALE 1 PUFF INTO THE LUNGS EVERY DAY (Patient taking differently: Take 1 puff by mouth daily.) 180 each 3   guaiFENesin (MUCINEX PO) Take by mouth as directed.     guaiFENesin-dextromethorphan (ROBITUSSIN DM) 100-10 MG/5ML syrup Take 10 mLs by mouth every 4 (four) hours as needed for cough. 118 mL 0   ipratropium-albuterol (DUONEB) 0.5-2.5 (3) MG/3ML SOLN Take 3 mLs by nebulization 2 (two) times daily. 360 mL 2   loratadine (  CLARITIN) 10 MG tablet TAKE 1 TABLET EVERY DAY 90 tablet 3   montelukast (SINGULAIR) 10 MG tablet Take 1 tablet (10 mg total) by mouth at bedtime. 30 tablet 1   pantoprazole (PROTONIX) 40 MG tablet Take 1 tablet (40 mg total) by mouth 2 (two) times daily. (Patient taking differently: Take 40 mg by mouth daily.) 60 tablet 1   sertraline (ZOLOFT) 50 MG tablet Take 1 tablet (50 mg total) by mouth daily. 30 tablet 0   ursodiol (ACTIGALL) 250 MG tablet Take 250 mg by mouth 2 (two) times daily.     No current facility-administered medications for this encounter.     REVIEW OF SYSTEMS: On review of systems, the patient reports she is doing very well.  She states that her bowel function has waxed and waned between constipation and diarrhea.  She states she has seen gross blood per rectum on rare occasion, and states that she is looking forward to her colonoscopy so she can get some clarity on the source of this as well as hopefully reassurance about why this has been happening.  She states that her breathing has been stable and she continues her prescribed medication from pulmonary without difficulty, she continues to use oxygen at 2 L nasal cannula continuously.  No other complaints are verbalized.     PHYSICAL EXAM:  Unable  to assess due to encounter type.    ECOG = 1  0 - Asymptomatic (Fully active, able to carry on all predisease activities without restriction)  1 - Symptomatic but completely ambulatory (Restricted in physically strenuous activity but ambulatory and able to carry out work of a light or sedentary nature. For example, light housework, office work)  2 - Symptomatic, <50% in bed during the day (Ambulatory and capable of all self care but unable to carry out any work activities. Up and about more than 50% of waking hours)  3 - Symptomatic, >50% in bed, but not bedbound (Capable of only limited self-care, confined to bed or chair 50% or more of waking hours)  4 - Bedbound (Completely disabled. Cannot carry on any self-care. Totally confined to bed or chair)  5 - Death   Santiago Glad MM, Creech RH, Tormey DC, et al. [phone]). "Toxicity and response criteria of the [facility]". Am. Evlyn Clines. Oncol. 5 (6): 649-55    LABORATORY DATA:  Lab Results  Component Value Date   WBC 11.5 (H) 07/02/2022   HGB 10.4 (L) 07/02/2022   HCT 32.7 (L) 07/02/2022   MCV 91.6 07/02/2022   PLT 257 07/02/2022   Lab Results  Component Value Date   NA 142 06/30/2022   K 3.5 06/30/2022   CL 112 (H) 06/30/2022   CO2 21 (L) 06/30/2022   Lab Results  Component Value Date   ALT 15 06/30/2022   AST 24 06/30/2022   ALKPHOS 82 06/30/2022   BILITOT 0.4 06/30/2022      RADIOGRAPHY: CT CHEST W CONTRAST  Result Date: 02/16/2023 CLINICAL DATA:  Non-small cell lung cancer status post SBRT. * Tracking Code: BO * EXAM: CT CHEST WITH CONTRAST TECHNIQUE: Multidetector CT imaging of the chest was performed during intravenous contrast administration. RADIATION DOSE REDUCTION: This exam was performed according to the departmental dose-optimization program which includes automated exposure control, adjustment of the mA and/or kV according to patient size and/or use of iterative reconstruction technique.  CONTRAST:  75mL OMNIPAQUE IOHEXOL 300 MG/ML  SOLN COMPARISON:  08/09/2022 FINDINGS: Cardiovascular: The heart is normal in  size. Small pericardial effusion. The aorta is normal in caliber. No dissection. Stable atherosclerotic calcifications. Stable three-vessel coronary artery calcifications. Mediastinum/Nodes: No mediastinal or hilar mass or lymphadenopathy. Small scattered lymph nodes are stable. The esophagus is grossly normal. Lungs/Pleura: The left upper lobe lung mass measures 25 x 21 mm on image number 37/6. It previously measured 27 x 19 mm. Surrounding radiation changes. Small fiducials are again demonstrated. Stable dense right apical pleural and parenchymal scarring. Stable subpleural 4 mm nodule at the left lung base on image 106/6. No pulmonary lesions or nodules are identified. Stable severe underlying lung disease with emphysematous changes and pulmonary scarring. Upper Abdomen: No significant upper abdominal findings. No hepatic or adrenal gland lesions. Stable hepatic cyst. Musculoskeletal: No breast masses, supraclavicular or axillary adenopathy. No significant bony findings. IMPRESSION: 1. Stable treated left upper lobe lung mass with surrounding radiation changes. No new or progressive findings. 2. No mediastinal or hilar mass or adenopathy. 3. Stable significant underlying lung disease. 4. Stable small pericardial effusion. 5. No findings upper abdominal or osseous metastatic disease. Emphysema (ICD10-J43.9). Electronically Signed   By: Rudie Meyer M.D.   On: 02/16/2023 11:15        IMPRESSION/PLAN: 1. Putative Stage IA2, cT1bN0M0 NSCLC of the LUL.  We reviewed the findings from the patient's recent CT scan which continue to be reassuring, we discussed continued close surveillance at 38-month intervals, the patient is approaching her 5-year anniversary from treatment in August 2025 and is in agreement to do another 68-month scan in April 2025 and then another before the end of the year  2025 before moving to annual surveillance scans.  She will keep me informed of any questions or concerns she has prior to her next visit.   2. LLL nodule.  Again this continues to remain very small and stable over time, this will be followed expectantly at times of her surveillance imaging. 3. COPD/Asthma. The patient continues to follow up with pulmonary medicine, as she uses oxygen at 2L [place]. We appreciate their help in continuing to maximize her lung function.  4. Small Pericardial Effusion. In retrospect this was a finding of note felt to be "trivial" on her echocardiogram in 2020. We will follow this expectantly. 5. Hematochezia.  The patient will follow-up with Dr. Dulce Sellar and proceed with colonoscopy.  We will follow this also expectantly.    This encounter was conducted via telephone.  The patient has provided two factor identification and has given verbal consent for this type of encounter and has been advised to only accept a meeting of this type in a secure network environment. The time spent during this encounter was 35 minutes including preparation, discussion, and coordination of the patient's care. The attendants for this meeting include Ronny Bacon  and Onnie Boer [place].  During the encounter, Ronny Bacon was located at [facility] Radiation Oncology Department.  Mateah Mulgrew Bailly was located at home.     Osker Mason, PAC

## 2023-03-07 ENCOUNTER — Encounter (HOSPITAL_COMMUNITY): Payer: Self-pay | Admitting: Gastroenterology

## 2023-03-13 NOTE — Anesthesia Preprocedure Evaluation (Signed)
Anesthesia Evaluation  Patient identified by MRN, date of birth, ID band Patient awake    Reviewed: Allergy & Precautions, NPO status , Patient's Chart, lab work & pertinent test results  Airway Mallampati: II  TM Distance: >3 FB Neck ROM: Full    Dental no notable dental hx. (+) Teeth Intact, Dental Advisory Given   Pulmonary former smoker   Pulmonary exam normal breath sounds clear to auscultation       Cardiovascular Exercise Tolerance: Good negative cardio ROS Normal cardiovascular exam Rhythm:Regular Rate:Normal     Neuro/Psych  PSYCHIATRIC DISORDERS Anxiety Depression       GI/Hepatic Neg liver ROS,GERD  Medicated and Controlled,,  Endo/Other    Renal/GU      Musculoskeletal  (+) Arthritis ,    Abdominal   Peds  Hematology   Anesthesia Other Findings   Reproductive/Obstetrics                             Anesthesia Physical Anesthesia Plan  ASA: 2  Anesthesia Plan: MAC   Post-op Pain Management:    Induction:   PONV Risk Score and Plan: Propofol infusion and Treatment may vary due to age or medical condition  Airway Management Planned: Nasal Cannula and Natural Airway  Additional Equipment: None  Intra-op Plan:   Post-operative Plan:   Informed Consent: I have reviewed the patients History and Physical, chart, labs and discussed the procedure including the risks, benefits and alternatives for the proposed anesthesia with the patient or authorized representative who has indicated his/her understanding and acceptance.     Dental advisory given  Plan Discussed with:   Anesthesia Plan Comments: (Colonoscopy for hematochezia)        Anesthesia Quick Evaluation

## 2023-03-14 ENCOUNTER — Encounter (HOSPITAL_COMMUNITY): Payer: Self-pay | Admitting: Gastroenterology

## 2023-03-14 ENCOUNTER — Encounter (HOSPITAL_COMMUNITY)
Admission: RE | Disposition: A | Discharge status: Home / Self Care | Payer: Self-pay | Source: Home / Self Care | Attending: Gastroenterology

## 2023-03-14 ENCOUNTER — Ambulatory Visit (HOSPITAL_COMMUNITY): Payer: Medicare HMO | Admitting: Anesthesiology

## 2023-03-14 ENCOUNTER — Other Ambulatory Visit: Payer: Self-pay

## 2023-03-14 ENCOUNTER — Ambulatory Visit (HOSPITAL_COMMUNITY)
Admission: RE | Admit: 2023-03-14 | Discharge: 2023-03-14 | Disposition: A | Discharge status: Home / Self Care | Payer: Medicare HMO | Attending: Gastroenterology | Admitting: Gastroenterology

## 2023-03-14 ENCOUNTER — Ambulatory Visit (HOSPITAL_BASED_OUTPATIENT_CLINIC_OR_DEPARTMENT_OTHER): Payer: Medicare HMO | Admitting: Anesthesiology

## 2023-03-14 DIAGNOSIS — K5730 Diverticulosis of large intestine without perforation or abscess without bleeding: Secondary | ICD-10-CM | POA: Insufficient documentation

## 2023-03-14 DIAGNOSIS — F32A Depression, unspecified: Secondary | ICD-10-CM | POA: Insufficient documentation

## 2023-03-14 DIAGNOSIS — M1990 Unspecified osteoarthritis, unspecified site: Secondary | ICD-10-CM | POA: Diagnosis not present

## 2023-03-14 DIAGNOSIS — K921 Melena: Secondary | ICD-10-CM | POA: Diagnosis not present

## 2023-03-14 DIAGNOSIS — R195 Other fecal abnormalities: Secondary | ICD-10-CM | POA: Insufficient documentation

## 2023-03-14 DIAGNOSIS — Z79899 Other long term (current) drug therapy: Secondary | ICD-10-CM | POA: Diagnosis not present

## 2023-03-14 DIAGNOSIS — D120 Benign neoplasm of cecum: Secondary | ICD-10-CM | POA: Insufficient documentation

## 2023-03-14 DIAGNOSIS — D123 Benign neoplasm of transverse colon: Secondary | ICD-10-CM | POA: Insufficient documentation

## 2023-03-14 DIAGNOSIS — Z860100 Personal history of colon polyps, unspecified: Secondary | ICD-10-CM | POA: Diagnosis not present

## 2023-03-14 DIAGNOSIS — D122 Benign neoplasm of ascending colon: Secondary | ICD-10-CM | POA: Insufficient documentation

## 2023-03-14 DIAGNOSIS — K649 Unspecified hemorrhoids: Secondary | ICD-10-CM | POA: Diagnosis not present

## 2023-03-14 DIAGNOSIS — F419 Anxiety disorder, unspecified: Secondary | ICD-10-CM | POA: Insufficient documentation

## 2023-03-14 DIAGNOSIS — R634 Abnormal weight loss: Secondary | ICD-10-CM | POA: Diagnosis not present

## 2023-03-14 DIAGNOSIS — D127 Benign neoplasm of rectosigmoid junction: Secondary | ICD-10-CM | POA: Diagnosis not present

## 2023-03-14 DIAGNOSIS — D126 Benign neoplasm of colon, unspecified: Secondary | ICD-10-CM

## 2023-03-14 DIAGNOSIS — D124 Benign neoplasm of descending colon: Secondary | ICD-10-CM | POA: Diagnosis not present

## 2023-03-14 DIAGNOSIS — K219 Gastro-esophageal reflux disease without esophagitis: Secondary | ICD-10-CM | POA: Insufficient documentation

## 2023-03-14 DIAGNOSIS — D125 Benign neoplasm of sigmoid colon: Secondary | ICD-10-CM | POA: Diagnosis not present

## 2023-03-14 DIAGNOSIS — Z87891 Personal history of nicotine dependence: Secondary | ICD-10-CM | POA: Insufficient documentation

## 2023-03-14 HISTORY — PX: COLONOSCOPY WITH PROPOFOL: SHX5780

## 2023-03-14 HISTORY — PX: HEMOSTASIS CLIP PLACEMENT: SHX6857

## 2023-03-14 HISTORY — PX: POLYPECTOMY: SHX5525

## 2023-03-14 SURGERY — COLONOSCOPY WITH PROPOFOL
Anesthesia: Monitor Anesthesia Care

## 2023-03-14 MED ORDER — SODIUM CHLORIDE 0.9 % IV SOLN: INTRAVENOUS | Status: DC

## 2023-03-14 MED ORDER — LIDOCAINE HCL (CARDIAC) PF 100 MG/5ML IV SOSY
PREFILLED_SYRINGE | INTRAVENOUS | Status: DC | PRN
  Administered 2023-03-14: 100 mg via INTRAVENOUS

## 2023-03-14 MED ORDER — PROPOFOL 10 MG/ML IV BOLUS
INTRAVENOUS | Status: DC | PRN
  Administered 2023-03-14: 70 ug/kg/min via INTRAVENOUS
  Administered 2023-03-14: 30 mg via INTRAVENOUS

## 2023-03-14 MED ORDER — PROPOFOL 500 MG/50ML IV EMUL
INTRAVENOUS | Status: AC
  Filled 2023-03-14: qty 50

## 2023-03-14 SURGICAL SUPPLY — 22 items

## 2023-03-14 NOTE — Discharge Instructions (Signed)

## 2023-03-14 NOTE — H&P (Signed)
[facility] H/P Note  Chief Complaint: blood in stool  HPI: Lori Butler is an 72 y.o. female.  Colonoscopy for weight loss and blood in stool and history polyps.  Past Medical History:  Diagnosis Date   Acute respiratory failure with hypoxia (HCC)    Anxiety    Anxiety and depression 05/05/2018   Aortic atherosclerosis (HCC)    Arthritis    "a little in my hands probably" (10/31/2017)   Asthma    Attempted suicide (HCC) 02/21/2018   Cellulitis of right leg 02/28/2019   Chronic respiratory failure with hypoxia (HCC) 03/05/2020   COPD (chronic obstructive pulmonary disease) (HCC)    COPD exacerbation (HCC)    COPD with asthma (HCC) 09/15/2017   FEV1 53%  Clinically seems to have a component of asthma  - alpha one AT   03/01/2019  =  MM level   196   Depression    History of kidney stones    lung ca dx'd 10/2018   Pneumonia due to COVID-19 virus 02/04/2020   Seasonal allergic rhinitis 08/15/2021   Solitary pulmonary nodule on lung CT 10/22/2018   Suicide attempt (HCC) 02/21/2018   overdosed on Wellbutrin   Tobacco abuse 09/20/2017   Vitamin B12 deficiency 11/10/2017   Vitamin D deficiency 11/10/2017    Past Surgical History:  Procedure Laterality Date   ABDOMINAL HYSTERECTOMY     APPENDECTOMY     BREAST BIOPSY Left 1990s   CATARACT EXTRACTION W/ INTRAOCULAR LENS  IMPLANT, BILATERAL Bilateral    DILATION AND CURETTAGE OF UTERUS     FUDUCIAL PLACEMENT N/A 11/11/2018   Procedure: PLACEMENT OF FUDUCIAL;  Surgeon: Loreli Slot, MD;  Location: [facility] OR;  Service: Thoracic;  Laterality: N/A;   TONSILLECTOMY     TUBAL LIGATION     VIDEO BRONCHOSCOPY WITH ENDOBRONCHIAL NAVIGATION N/A 11/11/2018   Procedure: VIDEO BRONCHOSCOPY WITH ENDOBRONCHIAL NAVIGATION;  Surgeon: Loreli Slot, MD;  Location: [facility] OR;  Service: Thoracic;  Laterality: N/A;    Medications Prior to Admission  Medication Sig Dispense Refill   atorvastatin (LIPITOR) 10 MG tablet Take 10  mg by mouth daily.     Cholecalciferol (VITAMIN D) 50 MCG (2000 UT) tablet Take 2,000 Units by mouth daily.     Cyanocobalamin (VITAMIN B-12) 5000 MCG TBDP Take 5,000 mcg by mouth daily.     fluticasone (FLONASE) 50 MCG/ACT nasal spray USE 1 TO 2 SPRAYS IN EACH NOSTRIL EVERY DAY 48 g 3   Fluticasone-Umeclidin-Vilant (TRELEGY ELLIPTA) 200-62.5-25 MCG/ACT AEPB INHALE 1 PUFF INTO THE LUNGS EVERY DAY (Patient taking differently: Take 1 puff by mouth daily.) 180 each 3   guaiFENesin (MUCINEX PO) Take by mouth as directed.     loratadine (CLARITIN) 10 MG tablet TAKE 1 TABLET EVERY DAY 90 tablet 3   montelukast (SINGULAIR) 10 MG tablet Take 1 tablet (10 mg total) by mouth at bedtime. 30 tablet 1   pantoprazole (PROTONIX) 40 MG tablet Take 1 tablet (40 mg total) by mouth 2 (two) times daily. (Patient taking differently: Take 40 mg by mouth daily.) 60 tablet 1   sertraline (ZOLOFT) 50 MG tablet Take 1 tablet (50 mg total) by mouth daily. 30 tablet 0   ursodiol (ACTIGALL) 250 MG tablet Take 250 mg by mouth 2 (two) times daily.     albuterol (PROVENTIL) (2.5 MG/3ML) 0.083% nebulizer solution Take 3 mLs (2.5 mg total) by nebulization every 6 (six) hours as needed for wheezing or shortness of breath. 75 mL 6  albuterol (VENTOLIN HFA) 108 (90 Base) MCG/ACT inhaler Inhale 2 puffs into the lungs every 6 (six) hours as needed for wheezing or shortness of breath. 1 each 2   alendronate (FOSAMAX) 70 MG tablet Take 70 mg by mouth every Wednesday.      Ferrous Sulfate (IRON PO) Take 325 mg by mouth daily.     guaiFENesin-dextromethorphan (ROBITUSSIN DM) 100-10 MG/5ML syrup Take 10 mLs by mouth every 4 (four) hours as needed for cough. 118 mL 0   ipratropium-albuterol (DUONEB) 0.5-2.5 (3) MG/3ML SOLN Take 3 mLs by nebulization 2 (two) times daily. 360 mL 2    Allergies:  Allergies  Allergen Reactions   Demerol [Meperidine] Other (See Comments)    "horrible reaction"   Oxycodone-Acetaminophen Other (See Comments)     Upset stomach    Tramadol Nausea And Vomiting    Family History  Problem Relation Age of Onset   Breast cancer Mother    Lung cancer Sister    Colon cancer Sister    Bone cancer Brother     Social History:  reports that she quit smoking about 5 years ago. Her smoking use included cigarettes. She started smoking about 56 years ago. She has a 76.5 pack-year smoking history. She has never used smokeless tobacco. She reports that she does not currently use alcohol. She reports that she does not use drugs.   ROS: As per HPI, all others negative   Blood pressure 128/76, pulse 91, temperature (!) 97.1 F (36.2 C), temperature source Temporal, resp. rate 17, height 5\' 4"  (1.626 m), weight 76.2 kg. General appearance: NAD, elderly HEENT:  [place]/AT CV: No tachycardia RESP:  No distress ABD:  Soft, non tender NEURO:  No encephalopathy  No results found for this or any previous visit (from the past 48 hour(s)). No results found.  Assessment/Plan   Blood in stool. Weight loss. Prior history of colonic polyps. Colonoscopy. Risks (bleeding, infection, bowel perforation that could require surgery, sedation-related changes in cardiopulmonary systems), benefits (identification and possible treatment of source of symptoms, exclusion of certain causes of symptoms), and alternatives (watchful waiting, radiographic imaging studies, empiric medical treatment) of colonoscopy were explained to patient/family in detail and patient wishes to proceed.   Lori Butler 03/14/2023, 9:18 AM

## 2023-03-14 NOTE — Op Note (Signed)
[facility] Patient Name: Lori Butler Procedure Date: 03/14/2023 MRN: [id number] Attending MD: Willis Modena , MD, [id number] Date of Birth: 1950-06-08 CSN: [id number] Age: 72 Admit Type: Outpatient Procedure:                Colonoscopy Indications:              Hematochezia, Positive fecal immunochemical test,                            Weight loss Providers:                Willis Modena, MD, Fransisca Connors, Rozetta Nunnery, Technician Referring MD:             Dr. Sigmund Hazel Medicines:                Monitored Anesthesia Care Complications:            No immediate complications. Estimated Blood Loss:     Estimated blood loss: none. Procedure:                Pre-Anesthesia Assessment:                           - Prior to the procedure, a History and Physical                            was performed, and patient medications and                            allergies were reviewed. The patient's tolerance of                            previous anesthesia was also reviewed. The risks                            and benefits of the procedure and the sedation                            options and risks were discussed with the patient.                            All questions were answered, and informed consent                            was obtained. Prior Anticoagulants: The patient has                            taken no anticoagulant or antiplatelet agents. ASA                            Grade Assessment: III - A patient with severe                            systemic disease. After  reviewing the risks and                            benefits, the patient was deemed in satisfactory                            condition to undergo the procedure.                           After obtaining informed consent, the colonoscope                            was passed under direct vision. Throughout the                            procedure, the  patient's blood pressure, pulse, and                            oxygen saturations were monitored continuously. The                            PCF-HQ190L ([id number] Olympus colonoscope was                            introduced through the anus and advanced to the the                            cecum, identified by appendiceal orifice and                            ileocecal valve. The ileocecal valve, appendiceal                            orifice, and rectum were photographed. The entire                            colon was examined. The colonoscopy was performed                            without difficulty. The patient tolerated the                            procedure well. The quality of the bowel                            preparation was fair. Scope In: 9:34:58 AM Scope Out: 10:33:10 AM Scope Withdrawal Time: 0 hours 47 minutes 7 seconds  Total Procedure Duration: 0 hours 58 minutes 12 seconds  Findings:      Hemorrhoids were found on perianal exam.      Multiple medium-mouthed diverticula were found in the sigmoid colon and       descending colon.      A 10 mm polyp was found in the cecum. The polyp was sessile. The polyp       was removed with a hot snare. Resection and  retrieval were complete.      Two sessile polyps were found in the ascending colon. The polyps were 5       to 7 mm in size. These polyps were removed with a hot snare. Resection       and retrieval were complete.      A 25 mm polyp was found in the hepatic flexure. The polyp was sessile.       The polyp was removed with a hot snare. Resection and retrieval were       complete.      An 8 mm polyp was found in the descending colon. The polyp was sessile.       The polyp was removed with a hot snare. Resection and retrieval were       complete. For hemostasis, one hemostatic clip was successfully placed.       There was no bleeding at the end of the procedure.      A 10 mm polyp was found in the descending colon.  The polyp was sessile.       The polyp was removed with a hot snare. Resection and retrieval were       complete.      A 30 mm polyp was found in the sigmoid colon. The polyp was       pedunculated. The polyp was removed with a hot snare. Resection and       retrieval were complete. To prevent bleeding post-intervention, two       hemostatic clips were successfully placed. There was no bleeding at the       end of the procedure.      A 10 mm polyp was found in the recto-sigmoid colon. The polyp was       sessile. The polyp was removed with a hot snare. Resection and retrieval       were complete.      Bowel prep diffusely fair; semisolid and viscous stool obscured some       views throughout colon; diminutive or subtle sessile polyps could easily       have been missed.      The exam was otherwise without abnormality. Very small rectal vault; no       lesions seen in rectal in prograde views upon slow withdrawal of the       colonoscope. Impression:               - Preparation of the colon was fair.                           - Hemorrhoids found on perianal exam.                           - Diverticulosis in the sigmoid colon and in the                            descending colon.                           - One 10 mm polyp in the cecum, removed with a hot                            snare. Resected and retrieved.                           -  Two 5 to 7 mm polyps in the ascending colon,                            removed with a hot snare. Resected and retrieved.                           - One 25 mm polyp at the hepatic flexure, removed                            with a hot snare. Resected and retrieved.                           - One 8 mm polyp in the descending colon, removed                            with a hot snare. Resected and retrieved. Clip was                            placed.                           - One 10 mm polyp in the descending colon, removed                             with a hot snare. Resected and retrieved.                           - One 30 mm polyp in the sigmoid colon, removed                            with a hot snare. Resected and retrieved. Clips                            were placed.                           - One 10 mm polyp at the recto-sigmoid colon,                            removed with a hot snare. Resected and retrieved.                           - The examination was otherwise normal. Moderate Sedation:      Not Applicable - Patient had care per Anesthesia. Recommendation:           - Patient has a contact number available for                            emergencies. The signs and symptoms of potential                            delayed complications were discussed with the  patient. Return to normal activities tomorrow.                            Written discharge instructions were provided to the                            patient.                           - Discharge patient to home (via wheelchair).                           - Resume previous diet today.                           - Continue present medications.                           - No aspirin, ibuprofen, naproxen, or other                            non-steroidal anti-inflammatory drugs for 7 days                            after polyp removal.                           - Await pathology results.                           - Repeat colonoscopy (date not yet determined) for                            surveillance based on pathology results.                           - Return to GI clinic after studies are complete.                           - Return to referring physician as previously                            scheduled. Procedure Code(s):        --- Professional ---                           [phone], Colonoscopy, flexible; with removal of                            tumor(s), polyp(s), or other lesion(s) by snare                             technique Diagnosis Code(s):        --- Professional ---                           K64.9, Unspecified hemorrhoids  D12.0, Benign neoplasm of cecum                           D12.3, Benign neoplasm of transverse colon (hepatic                            flexure or splenic flexure)                           D12.4, Benign neoplasm of descending colon                           D12.5, Benign neoplasm of sigmoid colon                           D12.7, Benign neoplasm of rectosigmoid junction                           D12.2, Benign neoplasm of ascending colon                           K92.1, Melena (includes Hematochezia)                           R19.5, Other fecal abnormalities                           R63.4, Abnormal weight loss                           K57.30, Diverticulosis of large intestine without                            perforation or abscess without bleeding CPT copyright 2022 [employer]. All rights reserved. The codes documented in this report are preliminary and upon coder review may  be revised to meet current compliance requirements. Willis Modena, MD 03/14/2023 10:54:44 AM This report has been signed electronically. Number of Addenda: 0

## 2023-03-14 NOTE — Transfer of Care (Signed)
Immediate Anesthesia Transfer of Care Note  Patient: Lori Butler  Procedure(s) Performed: COLONOSCOPY WITH PROPOFOL POLYPECTOMY HEMOSTASIS CLIP PLACEMENT  Patient Location: PACU  Anesthesia Type:MAC  Level of Consciousness: sedated  Airway & Oxygen Therapy: Patient Spontanous Breathing and Patient connected to face mask oxygen  Post-op Assessment: Report given to RN and Post -op Vital signs reviewed and stable  Post vital signs: Reviewed and stable  Last Vitals:  Vitals Value Taken Time  BP    Temp    Pulse    Resp    SpO2      Last Pain:  Vitals:   03/14/23 0743  TempSrc: Temporal  PainSc: 0-No pain         Complications: No notable events documented.

## 2023-03-14 NOTE — Anesthesia Postprocedure Evaluation (Signed)
Anesthesia Post Note  Patient: Lori Butler  Procedure(s) Performed: COLONOSCOPY WITH PROPOFOL POLYPECTOMY HEMOSTASIS CLIP PLACEMENT     Patient location during evaluation: Endoscopy Anesthesia Type: MAC Level of consciousness: awake and alert Pain management: pain level controlled Vital Signs Assessment: post-procedure vital signs reviewed and stable Respiratory status: spontaneous breathing, nonlabored ventilation, respiratory function stable and patient connected to nasal cannula oxygen Cardiovascular status: blood pressure returned to baseline and stable Postop Assessment: no apparent nausea or vomiting Anesthetic complications: no   No notable events documented.  Last Vitals:  Vitals:   03/14/23 1050 03/14/23 1100  BP: (!) 111/47 118/85  Pulse: 91 82  Resp: (!) 25 20  Temp:    SpO2: 98% 100%    Last Pain:  Vitals:   03/14/23 1050  TempSrc:   PainSc: 0-No pain                 [name]

## 2023-03-15 LAB — SURGICAL PATHOLOGY

## 2023-03-16 ENCOUNTER — Encounter (HOSPITAL_COMMUNITY): Payer: Self-pay | Admitting: Gastroenterology

## 2023-04-16 ENCOUNTER — Other Ambulatory Visit (HOSPITAL_BASED_OUTPATIENT_CLINIC_OR_DEPARTMENT_OTHER): Payer: Self-pay | Admitting: Pulmonary Disease

## 2023-06-04 DIAGNOSIS — K219 Gastro-esophageal reflux disease without esophagitis: Secondary | ICD-10-CM | POA: Diagnosis not present

## 2023-06-04 DIAGNOSIS — J439 Emphysema, unspecified: Secondary | ICD-10-CM | POA: Diagnosis not present

## 2023-06-04 DIAGNOSIS — J9611 Chronic respiratory failure with hypoxia: Secondary | ICD-10-CM | POA: Diagnosis not present

## 2023-06-04 DIAGNOSIS — I700 Atherosclerosis of aorta: Secondary | ICD-10-CM | POA: Diagnosis not present

## 2023-06-04 DIAGNOSIS — K743 Primary biliary cirrhosis: Secondary | ICD-10-CM | POA: Diagnosis not present

## 2023-06-04 DIAGNOSIS — M810 Age-related osteoporosis without current pathological fracture: Secondary | ICD-10-CM | POA: Diagnosis not present

## 2023-06-04 DIAGNOSIS — F325 Major depressive disorder, single episode, in full remission: Secondary | ICD-10-CM | POA: Diagnosis not present

## 2023-06-21 ENCOUNTER — Other Ambulatory Visit: Payer: Self-pay | Admitting: Nurse Practitioner

## 2023-06-21 DIAGNOSIS — J302 Other seasonal allergic rhinitis: Secondary | ICD-10-CM

## 2023-07-05 ENCOUNTER — Other Ambulatory Visit: Payer: Self-pay | Admitting: Nurse Practitioner

## 2023-07-05 DIAGNOSIS — J302 Other seasonal allergic rhinitis: Secondary | ICD-10-CM

## 2023-08-09 DIAGNOSIS — Z85118 Personal history of other malignant neoplasm of bronchus and lung: Secondary | ICD-10-CM | POA: Diagnosis not present

## 2023-08-09 DIAGNOSIS — E785 Hyperlipidemia, unspecified: Secondary | ICD-10-CM | POA: Diagnosis not present

## 2023-08-09 DIAGNOSIS — Z5982 Transportation insecurity: Secondary | ICD-10-CM | POA: Diagnosis not present

## 2023-08-09 DIAGNOSIS — J4489 Other specified chronic obstructive pulmonary disease: Secondary | ICD-10-CM | POA: Diagnosis not present

## 2023-08-09 DIAGNOSIS — F419 Anxiety disorder, unspecified: Secondary | ICD-10-CM | POA: Diagnosis not present

## 2023-08-09 DIAGNOSIS — Z9181 History of falling: Secondary | ICD-10-CM | POA: Diagnosis not present

## 2023-08-09 DIAGNOSIS — R32 Unspecified urinary incontinence: Secondary | ICD-10-CM | POA: Diagnosis not present

## 2023-08-09 DIAGNOSIS — K745 Biliary cirrhosis, unspecified: Secondary | ICD-10-CM | POA: Diagnosis not present

## 2023-08-09 DIAGNOSIS — F325 Major depressive disorder, single episode, in full remission: Secondary | ICD-10-CM | POA: Diagnosis not present

## 2023-08-09 DIAGNOSIS — Z7951 Long term (current) use of inhaled steroids: Secondary | ICD-10-CM | POA: Diagnosis not present

## 2023-08-09 DIAGNOSIS — I10 Essential (primary) hypertension: Secondary | ICD-10-CM | POA: Diagnosis not present

## 2023-08-09 DIAGNOSIS — Z7983 Long term (current) use of bisphosphonates: Secondary | ICD-10-CM | POA: Diagnosis not present

## 2023-08-09 DIAGNOSIS — K219 Gastro-esophageal reflux disease without esophagitis: Secondary | ICD-10-CM | POA: Diagnosis not present

## 2023-08-09 DIAGNOSIS — M810 Age-related osteoporosis without current pathological fracture: Secondary | ICD-10-CM | POA: Diagnosis not present

## 2023-08-09 DIAGNOSIS — Z8616 Personal history of COVID-19: Secondary | ICD-10-CM | POA: Diagnosis not present

## 2023-08-09 DIAGNOSIS — Z9981 Dependence on supplemental oxygen: Secondary | ICD-10-CM | POA: Diagnosis not present

## 2023-08-09 DIAGNOSIS — M1990 Unspecified osteoarthritis, unspecified site: Secondary | ICD-10-CM | POA: Diagnosis not present

## 2023-08-09 DIAGNOSIS — J301 Allergic rhinitis due to pollen: Secondary | ICD-10-CM | POA: Diagnosis not present

## 2023-08-15 ENCOUNTER — Telehealth: Payer: Self-pay | Admitting: *Deleted

## 2023-08-15 NOTE — Telephone Encounter (Signed)
 Called patient to inform of CT for 08-20-23- arrival time- 12:45 pm @ [facility] , no restrictions to scan, except wearing a bra without underwire, patient to be called with results on 09-04-23 @ 9 am, via telephone by Laurence Aly, spoke with patient and she is aware of these appts. and the instructions

## 2023-08-20 ENCOUNTER — Inpatient Hospital Stay: Attending: Family Medicine

## 2023-08-20 ENCOUNTER — Ambulatory Visit (HOSPITAL_COMMUNITY)
Admission: RE | Admit: 2023-08-20 | Discharge: 2023-08-20 | Disposition: A | Discharge status: Home / Self Care | Source: Ambulatory Visit | Attending: Radiation Oncology | Admitting: Radiation Oncology

## 2023-08-20 DIAGNOSIS — C3412 Malignant neoplasm of upper lobe, left bronchus or lung: Secondary | ICD-10-CM | POA: Diagnosis not present

## 2023-08-20 DIAGNOSIS — I2510 Atherosclerotic heart disease of native coronary artery without angina pectoris: Secondary | ICD-10-CM | POA: Diagnosis not present

## 2023-08-20 DIAGNOSIS — J432 Centrilobular emphysema: Secondary | ICD-10-CM | POA: Diagnosis not present

## 2023-08-20 LAB — POCT I-STAT CREATININE: Creatinine, Ser: 0.7 mg/dL (ref 0.44–1.00)

## 2023-08-20 MED ORDER — IOHEXOL 300 MG/ML  SOLN
75.0000 mL | Freq: Once | INTRAMUSCULAR | Status: AC | PRN
  Administered 2023-08-20: 75 mL via INTRAVENOUS

## 2023-08-20 MED ORDER — SODIUM CHLORIDE (PF) 0.9 % IJ SOLN
INTRAMUSCULAR | Status: AC
  Filled 2023-08-20: qty 50

## 2023-08-27 ENCOUNTER — Ambulatory Visit: Payer: Medicare HMO | Admitting: Radiation Oncology

## 2023-09-03 ENCOUNTER — Telehealth: Payer: Self-pay | Admitting: *Deleted

## 2023-09-03 NOTE — Telephone Encounter (Signed)
 Called patient to inform that scan results are not ready yet, and they may not be tomorrow either, if they are Richad Champagne will call and if not Richad Champagne will call after they are ready, spoke with patient and she verified understanding this

## 2023-09-04 ENCOUNTER — Encounter: Payer: Self-pay | Admitting: Radiation Oncology

## 2023-09-04 ENCOUNTER — Ambulatory Visit
Admission: RE | Admit: 2023-09-04 | Discharge: 2023-09-04 | Disposition: A | Discharge status: Home / Self Care | Source: Ambulatory Visit | Attending: Radiation Oncology | Admitting: Radiation Oncology

## 2023-09-04 NOTE — Progress Notes (Incomplete Revision)
 Telephone nursing appointment for review of most recent CT-Chest results (6 monthly CT surveillance). I verified patient's identity x2 and began nursing interview.   Patient reports issues as follows... -Pain: Denies -Fatigue: Mild -Chest/Ribs: Denies -Lungs: SOB/COPD/ASTHMA, managed w/ albuterol  medications, & 2 liters of oxygen .  -Cardiac: Denies any issues and Cardiac clarence provided by Dr. Villa Greaser 12/28/2022.  -Appetite: Good  SN: Blood in stool resolved post colonoscopy procedure 03/14/2023.  Patient denies any other related issues at this time.   Meaningful use complete.   Patient aware of their telephone appointment w/ Allana Ishikawa PA-C. I left my extension [phone] in case patient needs anything. Patient verbalized understanding. This concludes the nursing interview.   Patient preferred phone # [phone]   Avery Bodo, LPN

## 2023-09-04 NOTE — Progress Notes (Addendum)
 Telephone nursing appointment for review of most recent CT-Chest results (6 monthly CT surveillance). I verified patient's identity x2 and began nursing interview.   Patient reports issues as follows... -Pain: Denies -Fatigue: Mild -Chest/Ribs: Denies -Lungs: SOB/COPD/ASTHMA, managed w/ albuterol  medications, & 2 liters of oxygen .  -Cardiac: Denies any issues and Cardiac clarence provided by Dr. Villa Greaser 12/28/2022.  -Appetite: Good  SN: Blood in stool resolved post colonoscopy procedure 03/14/2023.  Patient denies any other related issues at this time.   Meaningful use complete.   Patient aware of their telephone appointment w/ Allana Ishikawa PA-C. I left my extension [phone] in case patient needs anything. Patient verbalized understanding. This concludes the nursing interview.   Patient preferred phone # [phone]   Avery Bodo, LPN

## 2023-09-10 ENCOUNTER — Telehealth: Payer: Self-pay

## 2023-09-10 NOTE — Telephone Encounter (Signed)
 Called pt to notify of pending result status for CT-Chest. Voicemail left.    Avery Bodo, LPN

## 2023-09-11 ENCOUNTER — Encounter: Payer: Self-pay | Admitting: Radiation Oncology

## 2023-09-11 NOTE — Progress Notes (Signed)
 I called and reviewed the patient's CT scan results.  We discussed the reassuring results and the rationale to continue in surveillance.  She is motivated to move to annual visits at this time. We will be in touch to coordinate her next appointment and CT scan next spring and she's encouraged to call sooner if she has questions or concerns regarding this appointment or any additional imaging she has outside of our clinic.

## 2023-09-12 ENCOUNTER — Encounter: Payer: Self-pay | Admitting: Radiation Oncology

## 2023-09-12 ENCOUNTER — Other Ambulatory Visit: Payer: Self-pay | Admitting: Radiation Oncology

## 2023-09-12 DIAGNOSIS — C3412 Malignant neoplasm of upper lobe, left bronchus or lung: Secondary | ICD-10-CM

## 2023-10-08 ENCOUNTER — Ambulatory Visit (HOSPITAL_BASED_OUTPATIENT_CLINIC_OR_DEPARTMENT_OTHER): Admitting: Pulmonary Disease

## 2023-10-08 ENCOUNTER — Encounter (HOSPITAL_BASED_OUTPATIENT_CLINIC_OR_DEPARTMENT_OTHER): Payer: Self-pay | Admitting: Pulmonary Disease

## 2023-10-08 VITALS — BP 127/67 | HR 99 | Ht 64.0 in | Wt 162.0 lb

## 2023-10-08 DIAGNOSIS — J4489 Other specified chronic obstructive pulmonary disease: Secondary | ICD-10-CM | POA: Diagnosis not present

## 2023-10-08 DIAGNOSIS — J9611 Chronic respiratory failure with hypoxia: Secondary | ICD-10-CM

## 2023-10-08 DIAGNOSIS — Z87891 Personal history of nicotine dependence: Secondary | ICD-10-CM

## 2023-10-08 MED ORDER — ALBUTEROL SULFATE (2.5 MG/3ML) 0.083% IN NEBU: 2.5000 mg | INHALATION_SOLUTION | Freq: Four times a day (QID) | RESPIRATORY_TRACT | 6 refills | Status: AC | PRN

## 2023-10-08 NOTE — Progress Notes (Signed)
 Subjective:    Patient ID: Lori Butler, female    DOB: October 25, 1950, 73 y.o.   MRN: [id number]  HPI  73  yo ex- smoker for FU of moderate COPD/ asthma, chronic resp failure with hypoxia & lung cancer   She quit smoking 09/2017 Sudden onset and variability suggests a component of asthma   She had 2 hospitalizations 12/2017 for acute hypercarbic respiratory failure with change in mental status.  Each time she improved quickly and did not require oxygen  on discharge   She underwent navigation biopsy of left upper lobe nodule showing atypical cells and SBRT 12/2018  10 month FU visit She manages her COPD and asthma symptoms effectively with Trelegy and experiences no significant flare-ups during the winter. She notes an increased heartbeat during physical activity, which resolves with rest. She uses supplemental oxygen , adjusting the flow to one liter when feeling well, and does not use oxygen  in the shower.  A nodule on her right lung is monitored with regular scans every six months and has remained stable for five years.  She requests a refill for albuterol  for her nebulizer, as her current supply is expired. She has not received the RSV vaccine due to transportation issues.   She underwent a colonoscopy one year ago, revealing nodules, diverticulitis, and hemorrhoids. Due to family history of colon cancer, a repeat colonoscopy is planned.  Significant tests/ events reviewed    CT chest 09/2023 >> no change LUL scarring CT chest w con 08/2022 >>  unchanged LUL scarring     CT chest 10/14/2018 -bilateral upper lobe parenchymal scarring on her prior CT and left upper lobe scar that appeared more nodular on the CT measuring about 13 mm PET scan showed hypermetabolism with SUV 8 in this nodule     PFTs 10/2018 showed moderate reversible airway obstruction with FEV1 of 37%-0.91 which improved to 47%-1.1L. DLCO was about 54%     PFTs 10/2017 -moderate airway obstruction, ratio 59, FEV1  51%, FVC 65%, no significant BD response, DLCO 57%  Review of Systems neg for any significant sore throat, dysphagia, itching, sneezing, nasal congestion or excess/ purulent secretions, fever, chills, sweats, unintended wt loss, pleuritic or exertional cp, hempoptysis, orthopnea pnd or change in chronic leg swelling. Also denies presyncope, palpitations, heartburn, abdominal pain, nausea, vomiting, diarrhea or change in bowel or urinary habits, dysuria,hematuria, rash, arthralgias, visual complaints, headache, numbness weakness or ataxia.     Objective:   Physical Exam  Gen. Pleasant, obese, in no distress ENT - no lesions, no post nasal drip Neck: No JVD, no thyromegaly, no carotid bruits Lungs: no use of accessory muscles, no dullness to percussion, decreased without rales or rhonchi  Cardiovascular: Rhythm regular, heart sounds  normal, no murmurs or gallops, no peripheral edema Musculoskeletal: No deformities, no cyanosis or clubbing , no tremors       Assessment & Plan:    Chronic obstructive pulmonary disease (COPD) COPD is well-managed with Trelegy, with no recent exacerbations. Oxygen  therapy is ongoing, requiring recertification for insurance. Lung nodules remain unchanged, indicating stability. She reports occasional exertional tachycardia, resolving with rest. - Submit recertification for oxygen  therapy. - Refill albuterol  for nebulizer. - Continue Trelegy as prescribed.  Chronic resp failure with Hypoxia Hypoxia is managed with supplemental oxygen . Oxygen  saturation drops to 88% on exertion, recovering to 96% with rest. She qualifies for continued oxygen  therapy. - Submit recertification for oxygen  therapy.  Asthma Asthma is well-controlled with current medication regimen. No recent exacerbations reported.  Colonoscopy follow-up She underwent a colonoscopy one year ago, revealing nodules, diverticulitis, and hemorrhoids. Due to family history of colon cancer, a repeat  colonoscopy is planned. The previous procedure was well-tolerated with oxygen  support. - cleared for repeat colonoscopy with due risk - Recommend procedure in hospital setting due to oxygen  use.

## 2023-10-08 NOTE — Patient Instructions (Signed)
 YOUR PLAN:  -CHRONIC OBSTRUCTIVE PULMONARY DISEASE (COPD): COPD is a chronic lung condition that makes it hard to breathe. Your COPD is well-managed with Trelegy, and you have not had any recent flare-ups. We will submit the necessary paperwork to recertify your oxygen  therapy for insurance purposes. You will also receive a refill for your albuterol  nebulizer. Please continue taking Trelegy as prescribed.  -HYPOXIA: Hypoxia means low oxygen  levels in your blood. You manage this with supplemental oxygen , and your oxygen  levels improve with rest. We will submit the paperwork to recertify your oxygen  therapy for insurance purposes.

## 2023-10-10 DIAGNOSIS — Z800 Family history of malignant neoplasm of digestive organs: Secondary | ICD-10-CM | POA: Diagnosis not present

## 2023-10-10 DIAGNOSIS — Z86018 Personal history of other benign neoplasm: Secondary | ICD-10-CM | POA: Diagnosis not present

## 2023-10-10 DIAGNOSIS — J449 Chronic obstructive pulmonary disease, unspecified: Secondary | ICD-10-CM | POA: Diagnosis not present

## 2023-10-17 ENCOUNTER — Telehealth: Payer: Self-pay

## 2023-10-17 NOTE — Telephone Encounter (Signed)
 Received surgical assessment from [facility] physicians.  Colonoscopy TBD.  Pt last seen 10/08/2023.  Dr. Villa Greaser, please advise and updated OV note. Thanks

## 2023-10-18 NOTE — Telephone Encounter (Signed)
 Surgical assessment has been faxed to [facility] physicians via epic.  Pt is aware and voiced her understanding.  Nothing further needed.

## 2023-10-19 ENCOUNTER — Telehealth: Payer: Self-pay

## 2023-10-19 DIAGNOSIS — J4489 Other specified chronic obstructive pulmonary disease: Secondary | ICD-10-CM

## 2023-10-20 ENCOUNTER — Emergency Department (HOSPITAL_COMMUNITY)

## 2023-10-20 ENCOUNTER — Other Ambulatory Visit: Payer: Self-pay

## 2023-10-20 ENCOUNTER — Encounter (HOSPITAL_COMMUNITY): Payer: Self-pay | Admitting: Emergency Medicine

## 2023-10-20 ENCOUNTER — Emergency Department (HOSPITAL_COMMUNITY)
Admission: EM | Admit: 2023-10-20 | Discharge: 2023-10-20 | Disposition: A | Discharge status: Home / Self Care | Attending: Emergency Medicine | Admitting: Emergency Medicine

## 2023-10-20 DIAGNOSIS — Z85118 Personal history of other malignant neoplasm of bronchus and lung: Secondary | ICD-10-CM | POA: Diagnosis not present

## 2023-10-20 DIAGNOSIS — R059 Cough, unspecified: Secondary | ICD-10-CM | POA: Insufficient documentation

## 2023-10-20 DIAGNOSIS — R0602 Shortness of breath: Secondary | ICD-10-CM | POA: Diagnosis not present

## 2023-10-20 DIAGNOSIS — I10 Essential (primary) hypertension: Secondary | ICD-10-CM | POA: Diagnosis not present

## 2023-10-20 DIAGNOSIS — J45909 Unspecified asthma, uncomplicated: Secondary | ICD-10-CM | POA: Diagnosis not present

## 2023-10-20 DIAGNOSIS — R9389 Abnormal findings on diagnostic imaging of other specified body structures: Secondary | ICD-10-CM | POA: Diagnosis not present

## 2023-10-20 DIAGNOSIS — R062 Wheezing: Secondary | ICD-10-CM

## 2023-10-20 DIAGNOSIS — J439 Emphysema, unspecified: Secondary | ICD-10-CM | POA: Diagnosis not present

## 2023-10-20 DIAGNOSIS — R0789 Other chest pain: Secondary | ICD-10-CM | POA: Diagnosis not present

## 2023-10-20 LAB — CBC
HCT: 35.6 % — ABNORMAL LOW (ref 36.0–46.0)
Hemoglobin: 11.3 g/dL — ABNORMAL LOW (ref 12.0–15.0)
MCH: 29.1 pg (ref 26.0–34.0)
MCHC: 31.7 g/dL (ref 30.0–36.0)
MCV: 91.8 fL (ref 80.0–100.0)
Platelets: 277 10*3/uL (ref 150–400)
RBC: 3.88 MIL/uL (ref 3.87–5.11)
RDW: 13.5 % (ref 11.5–15.5)
WBC: 9.6 10*3/uL (ref 4.0–10.5)
nRBC: 0 % (ref 0.0–0.2)

## 2023-10-20 LAB — COMPREHENSIVE METABOLIC PANEL WITH GFR
ALT: 19 U/L (ref 0–44)
AST: 24 U/L (ref 15–41)
Albumin: 3.3 g/dL — ABNORMAL LOW (ref 3.5–5.0)
Alkaline Phosphatase: 126 U/L (ref 38–126)
Anion gap: 11 (ref 5–15)
BUN: 10 mg/dL (ref 8–23)
CO2: 23 mmol/L (ref 22–32)
Calcium: 8.9 mg/dL (ref 8.9–10.3)
Chloride: 107 mmol/L (ref 98–111)
Creatinine, Ser: 0.7 mg/dL (ref 0.44–1.00)
GFR, Estimated: >60 mL/min (ref >60)
Glucose, Bld: 89 mg/dL (ref 70–99)
Potassium: 3 mmol/L — ABNORMAL LOW (ref 3.5–5.1)
Sodium: 141 mmol/L (ref 135–145)
Total Bilirubin: 0.5 mg/dL (ref 0.0–1.2)
Total Protein: 6.7 g/dL (ref 6.5–8.1)

## 2023-10-20 LAB — TROPONIN I (HIGH SENSITIVITY)
Troponin I (High Sensitivity): 8 ng/L (ref <18)
Troponin I (High Sensitivity): 8 ng/L (ref <18)

## 2023-10-20 LAB — RESP PANEL BY RT-PCR (RSV, FLU A&B, COVID)  RVPGX2
Influenza A by PCR: NEGATIVE
Influenza B by PCR: NEGATIVE
Resp Syncytial Virus by PCR: NEGATIVE
SARS Coronavirus 2 by RT PCR: NEGATIVE

## 2023-10-20 LAB — BRAIN NATRIURETIC PEPTIDE: B Natriuretic Peptide: 55.7 pg/mL (ref 0.0–100.0)

## 2023-10-20 LAB — D-DIMER, QUANTITATIVE: D-Dimer, Quant: 0.28 ug{FEU}/mL (ref 0.00–0.50)

## 2023-10-20 MED ORDER — IPRATROPIUM-ALBUTEROL 0.5-2.5 (3) MG/3ML IN SOLN
3.0000 mL | Freq: Once | RESPIRATORY_TRACT | Status: AC
  Administered 2023-10-20: 3 mL via RESPIRATORY_TRACT
  Filled 2023-10-20: qty 3

## 2023-10-20 MED ORDER — PREDNISONE 10 MG (21) PO TBPK: ORAL_TABLET | Freq: Every day | ORAL | 0 refills | Status: DC

## 2023-10-20 MED ORDER — METHYLPREDNISOLONE SODIUM SUCC 125 MG IJ SOLR
125.0000 mg | Freq: Once | INTRAMUSCULAR | Status: AC
Start: 2023-10-20 — End: 2023-10-20
  Administered 2023-10-20: 125 mg via INTRAVENOUS
  Filled 2023-10-20: qty 2

## 2023-10-20 NOTE — Discharge Instructions (Addendum)
 It was a pleasure taking care of you here today.  Your workup today was reassuring.  I am started you on a steroid taper pack.  Take as prescribed.  Use your nebulizer treatment every 4 hours as needed  Follow-up with your primary care doctor on Monday  Return for any worsening symptoms such as chest pain, worsening shortness of breath, weakness, falls.

## 2023-10-20 NOTE — ED Provider Notes (Signed)
 [place] EMERGENCY DEPARTMENT AT [facility] Provider Note   CSN: [id number] Arrival date & time: 10/20/23  1624     Patient presents with: Shortness of Breath   Lori Butler is a 73 y.o. female here for evaluation of shortness of breath.  Short of breath her last 3 to 4 days.  Has a history of asthma, emphysema, chronic hypoxic respiratory failure, lung cancer in remission.  States she has been feeling generally unwell.  No fever.  Feels fatigued.  Cough nonproductive.  Had some chest tightness which improved with a 10 mg albuterol  before EMS arrived.  She was concerned as she has needed steroids before that she would not be able to get them as it is the weekend.  No sick contacts.  No nausea, vomiting, chest pain, abdominal pain.  No PND orthopnea.  No lower extremity pain or swelling.  No history of PE or DVT.   HPI     Prior to Admission medications   Medication Sig Start Date End Date Taking? Authorizing Provider  predniSONE  (STERAPRED UNI-PAK 21 TAB) 10 MG (21) TBPK tablet Take by mouth daily. Take 6 tabs by mouth daily  for 1 days, then 5 tabs for 1 days, then 4 tabs for 1 days, then 3 tabs for 1 days, 2 tabs for 1 days, then 1 tab by mouth daily for 1 days 10/20/23  Yes Ilani Otterson A, PA-C  albuterol  (PROVENTIL ) (2.5 MG/3ML) 0.083% nebulizer solution Take 3 mLs (2.5 mg total) by nebulization every 6 (six) hours as needed for wheezing or shortness of breath. 10/08/23   Lind Repine, MD  albuterol  (VENTOLIN  HFA) 108 (90 Base) MCG/ACT inhaler Inhale 2 puffs into the lungs every 6 (six) hours as needed for wheezing or shortness of breath. 12/24/20   Raejean Bullock, NP  alendronate  (FOSAMAX ) 70 MG tablet Take 70 mg by mouth every Wednesday.  07/11/17   [provider]  atorvastatin  (LIPITOR) 10 MG tablet Take 10 mg by mouth daily.    [provider]  Cholecalciferol  (VITAMIN D ) 50 MCG (2000 UT) tablet Take 2,000 Units by mouth daily.    [provider]  Cyanocobalamin  (VITAMIN B-12) 5000 MCG TBDP Take 5,000 mcg by mouth daily. 11/14/19   [provider]  Ferrous Sulfate (IRON PO) Take 325 mg by mouth daily.    [provider]  fluticasone  (FLONASE ) 50 MCG/ACT nasal spray USE 1 TO 2 SPRAYS IN EACH NOSTRIL EVERY DAY 06/21/23   Cobb, Mariah Shines, NP  guaiFENesin  (MUCINEX  PO) Take by mouth as directed.    [provider]  loratadine  (CLARITIN ) 10 MG tablet TAKE 1 TABLET EVERY DAY 07/06/23   Cobb, Katherine V, NP  montelukast  (SINGULAIR ) 10 MG tablet Take 1 tablet (10 mg total) by mouth at bedtime. 12/26/17   Pinkey Brier, NP  pantoprazole  (PROTONIX ) 40 MG tablet Take 1 tablet (40 mg total) by mouth 2 (two) times daily. Patient taking differently: Take 40 mg by mouth daily. 12/26/17   Pinkey Brier, NP  sertraline  (ZOLOFT ) 50 MG tablet Take 1 tablet (50 mg total) by mouth daily. 03/01/18   Anson Kinnier, MD  TRELEGY ELLIPTA  200-62.5-25 MCG/ACT AEPB INHALE 1 PUFF INTO THE LUNGS EVERY DAY 04/23/23   Lind Repine, MD  ursodiol  (ACTIGALL ) 250 MG tablet Take 250 mg by mouth 2 (two) times daily.    [provider]    Allergies: Demerol  [meperidine ], Oxycodone -acetaminophen , and Tramadol  Review of Systems  Constitutional:  Positive for activity change.  HENT: Negative.    Respiratory:  Positive for cough, chest tightness and shortness of breath.   Cardiovascular: Negative.   Gastrointestinal: Negative.   Genitourinary: Negative.   Musculoskeletal: Negative.   Skin: Negative.   Neurological: Negative.   All other systems reviewed and are negative.   Updated Vital Signs BP 137/80   Pulse 90   Temp 98 F (36.7 C) (Oral)   Resp 20   Ht 5' 4 (1.626 m)   Wt 73.5 kg   SpO2 99%   BMI 27.81 kg/m   Physical Exam Vitals and nursing note reviewed.  Constitutional:      General: She is not in acute distress.    Appearance: She is well-developed. She is not ill-appearing, toxic-appearing  or diaphoretic.  HENT:     Head: Normocephalic and atraumatic.   Eyes:     Pupils: Pupils are equal, round, and reactive to light.    Cardiovascular:     Rate and Rhythm: Normal rate.     Pulses: Normal pulses.          Radial pulses are 2+ on the right side and 2+ on the left side.       Dorsalis pedis pulses are 2+ on the right side and 2+ on the left side.     Heart sounds: Normal heart sounds.  Pulmonary:     Effort: No respiratory distress.     Breath sounds: Wheezing present.     Comments: Minimal expiratory wheeze.  Speaks in full sentences without difficulty.  On home O2 via nasal cannula 2 L.  Decreased lung sounds right lower lobe Chest:     Comments: Nontender, no crepitus or step-off Abdominal:     General: Bowel sounds are normal. There is no distension.     Palpations: Abdomen is soft.     Tenderness: There is no abdominal tenderness. There is no guarding or rebound.     Comments: Soft, nontender, no rebound or guarding   Musculoskeletal:        General: Normal range of motion.     Cervical back: Normal range of motion.     Right lower leg: No tenderness. No edema.     Left lower leg: No tenderness. No edema.     Comments: No bony tenderness, compartments soft, no lower extremity edema.  Full range of motion without difficulty.   Skin:    General: Skin is warm and dry.     Capillary Refill: Capillary refill takes less than 2 seconds.   Neurological:     General: No focal deficit present.     Mental Status: She is alert and oriented to person, place, and time.   Psychiatric:        Mood and Affect: Mood normal.     (all labs ordered are listed, but only abnormal results are displayed) Labs Reviewed  COMPREHENSIVE METABOLIC PANEL WITH GFR - Abnormal; Notable for the following components:      Result Value   Potassium 3.0 (*)    Albumin 3.3 (*)    All other components within normal limits  CBC - Abnormal; Notable for the following components:    Hemoglobin 11.3 (*)    HCT 35.6 (*)    All other components within normal limits  RESP PANEL BY RT-PCR (RSV, FLU A&B, COVID)  RVPGX2  BRAIN NATRIURETIC PEPTIDE  D-DIMER, QUANTITATIVE (NOT AT [facility])  TROPONIN I (HIGH SENSITIVITY)  TROPONIN I (HIGH SENSITIVITY)    EKG: None  Radiology: DG Chest Portable 1 View Result Date: 10/20/2023 CLINICAL DATA:  Shortness of breath. History of asthma, emphysema, and lung cancer. EXAM: PORTABLE CHEST 1 VIEW COMPARISON:  06/30/2022. FINDINGS: The heart size and mediastinal contours are within normal limits. There is atherosclerotic calcification of the aorta. There is mild elevation of the right diaphragm with minimal atelectasis at the right lung base. There is a chronic opacity with fiduciary marker in the left upper lobe, not significantly changed. No effusion or pneumothorax is seen. No acute osseous abnormality is seen. IMPRESSION: Stable chest with no acute process. Electronically Signed   By: Wyvonnia Heimlich M.D.   On: 10/20/2023 17:16     Procedures   Medications Ordered in the ED  methylPREDNISolone  sodium succinate (SOLU-MEDROL ) 125 mg/2 mL injection 125 mg (125 mg Intravenous Given 10/20/23 1710)  ipratropium-albuterol  (DUONEB) 0.5-2.5 (3) MG/3ML nebulizer solution 3 mL (3 mLs Nebulization Given 10/20/23 1712)   15 old here for evaluation of shortness of breath and feeling unwell.  Symptoms started 3 to 4 days ago.  She is on her home oxygen  of 2 L via nasal cannula.  She states she did an albuterol  treatment prior to EMS arrival which resolved the majority of her symptoms.  She still on evaluation given it is the weekend she has needed steroids for similar symptoms previously.  Does have remote history of lung cancer in remission.  On arrival she has very minimal expiratory wheeze.  She does not appear grossly fluid overloaded.  She denies any prior history of PE or DVT.  She is without tachycardia, tachypnea or hypoxia.  Will plan on labs, imaging  and reassess  Labs and imaging personally viewed and interpreted:  EKG without ischemic changes Chest x-ray without acute abnormality CBC without leukocytosis, hemoglobin 11.3 at baseline CMP potassium 3.0 Troponin 8--8 BNP 55 Ddimer 0.28 COVID, flu, RSV negative  Reassessed.  Symptoms improved after steroids and DuoNeb.  No longer wheezing.  No chest tightness or shortness of breath currently.  Pending delta troponin and D-dimer  Patient reassessed.  Ambulatory on her home oxygen .  Suspect likely asthma exacerbation.  Low suspicion for acute ACS, PE, dissection, unstable angina, pneumothorax, [place], bacterial infectious process, fluid overload.  Will have follow-up outpatient, return for any worsening symptoms.  The patient has been appropriately medically screened and/or stabilized in the ED. I have low suspicion for any other emergent medical condition which would require further screening, evaluation or treatment in the ED or require inpatient management.  Patient is hemodynamically stable and in no acute distress.  Patient able to ambulate in department prior to ED.  Evaluation does not show acute pathology that would require ongoing or additional emergent interventions while in the emergency department or further inpatient treatment.  I have discussed the diagnosis with the patient and answered all questions.  Pain is been managed while in the emergency department and patient has no further complaints prior to discharge.  Patient is comfortable with plan discussed in room and is stable for discharge at this time.  I have discussed strict return precautions for returning to the emergency department.  Patient was encouraged to follow-up with PCP/specialist refer to at discharge.                                   Medical Decision Making Amount and/or Complexity of Data Reviewed  External Data Reviewed: labs, radiology, ECG and notes. Labs: ordered. Decision-making details documented  in ED Course. Radiology: ordered and independent interpretation performed. Decision-making details documented in ED Course.  Risk OTC drugs. Prescription drug management. Parenteral controlled substances. Decision regarding hospitalization. Diagnosis or treatment significantly limited by social determinants of health.       Final diagnoses:  SOB (shortness of breath)  Wheeze    ED Discharge Orders          Ordered    predniSONE  (STERAPRED UNI-PAK 21 TAB) 10 MG (21) TBPK tablet  Daily        10/20/23 2036               Trissa Molina A, PA-C 10/20/23 2038    Lind Repine, MD 10/21/23 2254

## 2023-10-20 NOTE — ED Triage Notes (Signed)
 Pt bib EMS from home. Pt experiencing SOB last 3-4 days. Pt has history of asthma, emphysema and lung cancer. Pt took 10mg  albuterol  before EMS arrived and SOB resolved per pt. Pt still wanted to get looked at b/c she was told by primary that she is a high risk for resp. depression.

## 2023-10-24 DIAGNOSIS — J9611 Chronic respiratory failure with hypoxia: Secondary | ICD-10-CM | POA: Diagnosis not present

## 2023-10-24 DIAGNOSIS — Z0000 Encounter for general adult medical examination without abnormal findings: Secondary | ICD-10-CM | POA: Diagnosis not present

## 2023-10-24 DIAGNOSIS — J449 Chronic obstructive pulmonary disease, unspecified: Secondary | ICD-10-CM | POA: Diagnosis not present

## 2023-10-24 DIAGNOSIS — I2510 Atherosclerotic heart disease of native coronary artery without angina pectoris: Secondary | ICD-10-CM | POA: Diagnosis not present

## 2023-10-24 DIAGNOSIS — Z23 Encounter for immunization: Secondary | ICD-10-CM | POA: Diagnosis not present

## 2023-10-24 DIAGNOSIS — Z6827 Body mass index (BMI) 27.0-27.9, adult: Secondary | ICD-10-CM | POA: Diagnosis not present

## 2023-10-24 DIAGNOSIS — M810 Age-related osteoporosis without current pathological fracture: Secondary | ICD-10-CM | POA: Diagnosis not present

## 2023-10-24 DIAGNOSIS — F325 Major depressive disorder, single episode, in full remission: Secondary | ICD-10-CM | POA: Diagnosis not present

## 2023-10-24 DIAGNOSIS — E7800 Pure hypercholesterolemia, unspecified: Secondary | ICD-10-CM | POA: Diagnosis not present

## 2023-10-29 DIAGNOSIS — I2510 Atherosclerotic heart disease of native coronary artery without angina pectoris: Secondary | ICD-10-CM | POA: Diagnosis not present

## 2023-10-29 DIAGNOSIS — M810 Age-related osteoporosis without current pathological fracture: Secondary | ICD-10-CM | POA: Diagnosis not present

## 2023-10-30 ENCOUNTER — Telehealth: Payer: Self-pay | Admitting: *Deleted

## 2023-10-30 NOTE — Progress Notes (Signed)
 Complex Care Management Note  Care Guide Note 10/30/2023 Name: Lori Butler MRN: [id number] DOB: April 10, 1951  Lori Butler is a 73 y.o. year old female who sees Cleotilde Planas, MD for primary care. I reached out to Nathanel Macario Scholze by phone today to offer complex care management services.  Ms. Eckmann was given information about Complex Care Management services today including:   The Complex Care Management services include support from the care team which includes your Nurse Care Manager, Clinical Social Worker, or Pharmacist.  The Complex Care Management team is here to help remove barriers to the health concerns and goals most important to you. Complex Care Management services are voluntary, and the patient may decline or stop services at any time by request to their care team member.   Complex Care Management Consent Status: Patient agreed to services and verbal consent obtained.   Follow up plan:  Telephone appointment with complex care management team member scheduled for:  10/31/23  Encounter Outcome:  Patient Scheduled  Harlene Satterfield  [facility]  [facility], [facility] Guide  Direct Dial: [phone]  Fax [phone]

## 2023-10-31 ENCOUNTER — Other Ambulatory Visit: Payer: Self-pay

## 2023-10-31 NOTE — Patient Outreach (Signed)
 Complex Care Management   Visit Note  10/31/2023  Name:  Lori Butler MRN: [id number] DOB: 22-Feb-1951  Situation: Referral received for Complex Care Management related to SDOH Barriers:  Financial Resource Strain I obtained verbal consent from Patient.  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 BSW did not identify any immediate SDOH barriers at this time. Social worker however did send patient resources for utility assistance if needed in the future.      Recommendation:   No recommendations at this time  Follow Up Plan:   Patient has met all care management goals. Care Management case will be closed. Patient has been provided contact information should new needs arise.   Orlean Fey, BSW [place]  [facility] Social Worker, [facility] [phone]

## 2023-10-31 NOTE — Patient Instructions (Signed)
 Visit Information  Thank you for taking time to visit with me today. Please don't hesitate to contact me if I can be of assistance to you before our next scheduled appointment.  Our next appointment is no further scheduled appointments.   Please call the care guide team at [phone] if you need to cancel or reschedule your appointment.    Please call the Suicide and Crisis Lifeline: 988 call the [place]  National Suicide Prevention Lifeline: 6020706364 or TTY: 623-840-8122 TTY 641-082-2735) to talk to a trained counselor call 1-800-273-TALK (toll free, 24 hour hotline) go to [facility] [address] [place] [id number]) call 911 if you are experiencing a Mental Health or Behavioral Health Crisis or need someone to talk to.  Patient verbalizes understanding of instructions and care plan provided today and agrees to view in MyChart. Active MyChart status and patient understanding of how to access instructions and care plan via MyChart confirmed with patient.     Haven Lion, BSW [place]  [facility] Social Worker, [facility] [phone]

## 2023-12-17 ENCOUNTER — Telehealth: Payer: Self-pay | Admitting: Pulmonary Disease

## 2023-12-17 NOTE — Telephone Encounter (Signed)
 Copied from CRM #[id number]. Topic: General - Other >> Dec 17, 2023  9:14 AM Joesph PARAS wrote: Reason for CRM: Earnie is calling with [employer] to inquire about the written order faxed over on 08/06 and 08/08 requesting provider sign-off on patient's re-certification for oxygen . Per [employer], please sign and return order.

## 2023-12-17 NOTE — Telephone Encounter (Signed)
 Routing to Dr Jude to sign.

## 2023-12-19 NOTE — Telephone Encounter (Signed)
 Copied from CRM [id number]. Topic: General - Other >> Dec 19, 2023  9:29 AM Benton KIDD wrote: Reason for RMF:ejupzwu is calling cause Dr Jude  recertified me for oxygen  with [employer] . [employer] keeps saying they are not getting no response and they would like to get the recertification letter  [phone] Been on oxygen  for 5 years and in 5 years the oxygen  equipment reached its life span and they need to replace with new equipment.   Jamie, can you look into this?

## 2023-12-19 NOTE — Telephone Encounter (Signed)
 I have the form but Dr Jude has been out of the office for two weeks. He is not back in the office until 12/27/2023

## 2023-12-19 NOTE — Telephone Encounter (Signed)
 Form given to Katie to sign

## 2023-12-19 NOTE — Telephone Encounter (Signed)
 Pt notified CMN for O2 signed by Izetta Rouleau, NP and confirmation recevied

## 2024-01-08 ENCOUNTER — Other Ambulatory Visit: Payer: Self-pay | Admitting: Gastroenterology

## 2024-01-10 DIAGNOSIS — Z1231 Encounter for screening mammogram for malignant neoplasm of breast: Secondary | ICD-10-CM | POA: Diagnosis not present

## 2024-01-30 ENCOUNTER — Encounter (HOSPITAL_COMMUNITY): Payer: Self-pay | Admitting: Gastroenterology

## 2024-02-06 ENCOUNTER — Ambulatory Visit (HOSPITAL_BASED_OUTPATIENT_CLINIC_OR_DEPARTMENT_OTHER): Admitting: Certified Registered"

## 2024-02-06 ENCOUNTER — Encounter (HOSPITAL_COMMUNITY): Payer: Self-pay | Admitting: Gastroenterology

## 2024-02-06 ENCOUNTER — Encounter (HOSPITAL_COMMUNITY)
Admission: RE | Disposition: A | Discharge status: Home / Self Care | Payer: Self-pay | Source: Home / Self Care | Attending: Gastroenterology

## 2024-02-06 ENCOUNTER — Ambulatory Visit (HOSPITAL_COMMUNITY): Admitting: Certified Registered"

## 2024-02-06 ENCOUNTER — Ambulatory Visit (HOSPITAL_COMMUNITY)
Admission: RE | Admit: 2024-02-06 | Discharge: 2024-02-06 | Disposition: A | Discharge status: Home / Self Care | Attending: Gastroenterology | Admitting: Gastroenterology

## 2024-02-06 DIAGNOSIS — Z1211 Encounter for screening for malignant neoplasm of colon: Secondary | ICD-10-CM

## 2024-02-06 DIAGNOSIS — D125 Benign neoplasm of sigmoid colon: Secondary | ICD-10-CM | POA: Insufficient documentation

## 2024-02-06 DIAGNOSIS — D122 Benign neoplasm of ascending colon: Secondary | ICD-10-CM | POA: Diagnosis not present

## 2024-02-06 DIAGNOSIS — J4489 Other specified chronic obstructive pulmonary disease: Secondary | ICD-10-CM | POA: Insufficient documentation

## 2024-02-06 DIAGNOSIS — R195 Other fecal abnormalities: Secondary | ICD-10-CM | POA: Diagnosis not present

## 2024-02-06 DIAGNOSIS — K621 Rectal polyp: Secondary | ICD-10-CM | POA: Diagnosis not present

## 2024-02-06 DIAGNOSIS — K648 Other hemorrhoids: Secondary | ICD-10-CM | POA: Insufficient documentation

## 2024-02-06 DIAGNOSIS — K5730 Diverticulosis of large intestine without perforation or abscess without bleeding: Secondary | ICD-10-CM | POA: Insufficient documentation

## 2024-02-06 DIAGNOSIS — Z860100 Personal history of colon polyps, unspecified: Secondary | ICD-10-CM | POA: Diagnosis present

## 2024-02-06 DIAGNOSIS — D128 Benign neoplasm of rectum: Secondary | ICD-10-CM | POA: Diagnosis not present

## 2024-02-06 DIAGNOSIS — K649 Unspecified hemorrhoids: Secondary | ICD-10-CM | POA: Diagnosis not present

## 2024-02-06 DIAGNOSIS — F418 Other specified anxiety disorders: Secondary | ICD-10-CM | POA: Diagnosis not present

## 2024-02-06 DIAGNOSIS — D123 Benign neoplasm of transverse colon: Secondary | ICD-10-CM | POA: Diagnosis not present

## 2024-02-06 DIAGNOSIS — K623 Rectal prolapse: Secondary | ICD-10-CM | POA: Diagnosis not present

## 2024-02-06 DIAGNOSIS — R634 Abnormal weight loss: Secondary | ICD-10-CM | POA: Diagnosis not present

## 2024-02-06 DIAGNOSIS — J449 Chronic obstructive pulmonary disease, unspecified: Secondary | ICD-10-CM | POA: Diagnosis not present

## 2024-02-06 DIAGNOSIS — K6389 Other specified diseases of intestine: Secondary | ICD-10-CM | POA: Diagnosis not present

## 2024-02-06 DIAGNOSIS — K635 Polyp of colon: Secondary | ICD-10-CM | POA: Diagnosis not present

## 2024-02-06 HISTORY — PX: COLONOSCOPY: SHX5424

## 2024-02-06 SURGERY — COLONOSCOPY
Anesthesia: Monitor Anesthesia Care

## 2024-02-06 MED ORDER — LIDOCAINE 2% (20 MG/ML) 5 ML SYRINGE
INTRAMUSCULAR | Status: DC | PRN
Start: 2024-02-06 — End: 2024-02-06
  Administered 2024-02-06: 60 mg via INTRAVENOUS

## 2024-02-06 MED ORDER — PROPOFOL 10 MG/ML IV BOLUS
INTRAVENOUS | Status: DC | PRN
  Administered 2024-02-06: 30 mg via INTRAVENOUS
  Administered 2024-02-06: 100 ug/kg/min via INTRAVENOUS
  Administered 2024-02-06: 60 mg via INTRAVENOUS

## 2024-02-06 MED ORDER — SODIUM CHLORIDE 0.9 % IV SOLN
INTRAVENOUS | Status: AC | PRN
Start: 2024-02-06 — End: 2024-02-06
  Administered 2024-02-06: 500 mL via INTRAMUSCULAR

## 2024-02-06 MED ORDER — SODIUM CHLORIDE 0.9 % IV SOLN: INTRAVENOUS | Status: DC

## 2024-02-06 MED ORDER — PROPOFOL 1000 MG/100ML IV EMUL
INTRAVENOUS | Status: AC
  Filled 2024-02-06: qty 100

## 2024-02-06 NOTE — Anesthesia Postprocedure Evaluation (Signed)
 Anesthesia Post Note  Patient: Lori Butler  Procedure(s) Performed: COLONOSCOPY     Patient location during evaluation: PACU Anesthesia Type: MAC Level of consciousness: awake and alert and oriented Pain management: pain level controlled Vital Signs Assessment: post-procedure vital signs reviewed and stable Respiratory status: spontaneous breathing, nonlabored ventilation, respiratory function stable and patient connected to nasal cannula oxygen  Cardiovascular status: stable and blood pressure returned to baseline Postop Assessment: no apparent nausea or vomiting Anesthetic complications: no   No notable events documented.  Last Vitals:  Vitals:   02/06/24 1100 02/06/24 1105  BP: 125/65 131/61  Pulse: 84 82  Resp: 16 15  Temp:    SpO2: 91% 97%    Last Pain:  Vitals:   02/06/24 1046  TempSrc: Temporal  PainSc: 0-No pain                 Kennede Lusk A.

## 2024-02-06 NOTE — Anesthesia Procedure Notes (Addendum)
 Date/Time: 02/06/2024 9:54 AM  Performed by: Para Jerelene CROME, CRNAOxygen Delivery Method: Nasal cannula Comments: OptiFlow Nasal Cannula.

## 2024-02-06 NOTE — H&P (Signed)
 [facility] H/P Note  Chief Complaint: personal history of colonic polyps  HPI: Lori Butler is an 73 y.o. female.  FIT positive colonoscopy November 2024 multiple polyps some of which well over 1 cm in size.  Here for follow-up colonoscopy.    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   FEV1 53%  Clinically seems to have a component of asthma  - alpha one AT   03/01/2019  =  MM level   196   Depression    History of kidney stones    lung ca dx'd 10/2018   Pneumonia due to COVID-19 virus 02/04/2020   Seasonal allergic rhinitis 08/15/2021   Solitary pulmonary nodule on lung CT 10/22/2018   Suicide attempt (HCC) 02/21/2018   overdosed on Wellbutrin    Tobacco abuse 09/20/2017   Vitamin B12 deficiency 11/10/2017   Vitamin D  deficiency 11/10/2017    Past Surgical History:  Procedure Laterality Date   ABDOMINAL HYSTERECTOMY     APPENDECTOMY     BREAST BIOPSY Left 1990s   CATARACT EXTRACTION W/ INTRAOCULAR LENS  IMPLANT, BILATERAL Bilateral    COLONOSCOPY WITH PROPOFOL  N/A 03/14/2023   Procedure: COLONOSCOPY WITH PROPOFOL ;  Surgeon: Burnette Fallow, MD;  Location: [facility];  Service: Gastroenterology;  Laterality: N/A;   DILATION AND CURETTAGE OF UTERUS     FUDUCIAL PLACEMENT N/A 11/11/2018   Procedure: PLACEMENT OF FUDUCIAL;  Surgeon: Kerrin Elspeth BROCKS, MD;  Location[facility];  Service: Thoracic;  Laterality: N/A;   HEMOSTASIS CLIP PLACEMENT  03/14/2023   Procedure: HEMOSTASIS CLIP PLACEMENT;  Surgeon: Burnette Fallow, MD;   Location: [facility];  Service: Gastroenterology;;   POLYPECTOMY  03/14/2023   Procedure: POLYPECTOMY;  Surgeon: Burnette Fallow, MD;  Location: [facility];  Service: Gastroenterology;;   TONSILLECTOMY     TUBAL LIGATION     VIDEO BRONCHOSCOPY WITH ENDOBRONCHIAL NAVIGATION N/A 11/11/2018   Procedure: VIDEO BRONCHOSCOPY WITH ENDOBRONCHIAL NAVIGATION;  Surgeon: Kerrin Elspeth BROCKS, MD;  Location: [facility] OR;  Service: Thoracic;  Laterality: N/A;    Medications Prior to Admission  Medication Sig Dispense Refill   albuterol  (VENTOLIN  HFA) 108 (90 Base) MCG/ACT inhaler Inhale 2 puffs into the lungs every 6 (six) hours as needed for wheezing or shortness of breath. 1 each 2   alendronate  (FOSAMAX ) 70 MG tablet Take 70 mg by mouth every Wednesday.      atorvastatin  (LIPITOR) 10 MG tablet Take 10 mg by mouth daily.     Cholecalciferol  (VITAMIN D ) 50 MCG (2000 UT) tablet Take 2,000 Units by mouth daily.     Cyanocobalamin  (VITAMIN B-12) 5000 MCG TBDP Take 5,000 mcg by mouth daily.     Ferrous Sulfate (IRON PO) Take 325 mg by mouth daily.     fluticasone  (FLONASE ) 50 MCG/ACT nasal spray USE 1 TO 2 SPRAYS IN EACH NOSTRIL EVERY DAY 48 g 3   guaiFENesin  (MUCINEX  PO) Take by mouth as directed.     loratadine  (CLARITIN ) 10 MG tablet TAKE 1 TABLET EVERY DAY 90 tablet  3   sertraline  (ZOLOFT ) 50 MG tablet Take 1 tablet (50 mg total) by mouth daily. 30 tablet 0   TRELEGY ELLIPTA  200-62.5-25 MCG/ACT AEPB INHALE 1 PUFF INTO THE LUNGS EVERY DAY 180 each 3   ursodiol  (ACTIGALL ) 250 MG tablet Take 250 mg by mouth 2 (two) times daily.     albuterol  (PROVENTIL ) (2.5 MG/3ML) 0.083% nebulizer solution Take 3 mLs (2.5 mg total) by nebulization every 6 (six) hours as needed for wheezing or shortness of breath. 75 mL 6   montelukast  (SINGULAIR ) 10 MG tablet Take 1 tablet (10 mg total) by mouth at bedtime. 30 tablet 1   pantoprazole  (PROTONIX ) 40 MG tablet Take 1 tablet (40 mg total) by mouth 2 (two) times daily. (Patient  taking differently: Take 40 mg by mouth daily.) 60 tablet 1   predniSONE  (STERAPRED UNI-PAK 21 TAB) 10 MG (21) TBPK tablet Take by mouth daily. Take 6 tabs by mouth daily  for 1 days, then 5 tabs for 1 days, then 4 tabs for 1 days, then 3 tabs for 1 days, 2 tabs for 1 days, then 1 tab by mouth daily for 1 days 21 tablet 0    Allergies:  Allergies  Allergen Reactions   Demerol  [Meperidine ] Other (See Comments)    horrible reaction   Oxycodone -Acetaminophen  Other (See Comments)    Upset stomach    Tramadol Nausea And Vomiting    Family History  Problem Relation Age of Onset   Breast cancer Mother    Lung cancer Sister    Colon cancer Sister    Bone cancer Brother     Social History:  reports that she quit smoking about 6 years ago. Her smoking use included cigarettes. She started smoking about 57 years ago. She has a 76.5 pack-year smoking history. She has never used smokeless tobacco. She reports that she does not currently use alcohol. She reports that she does not use drugs.   ROS: As per HPI, all others negative   Blood pressure (!) 128/52, pulse 72, temperature 98.2 F (36.8 C), temperature source Temporal, resp. rate 10, height 5' 4 (1.626 m), weight 73 kg, SpO2 99%. GEN:  NAD HEENT:  [place]/AT, anicteric CV:  Regular LUNGS:  Mild distress (chronic, stable) ABD:  Soft, non-tender NEURO:  No encephalopathy  No results found for this or any previous visit (from the past 48 hours). No results found.  Assessment/Plan   Colon polyps. Colonoscopy for further evaluation. Risks (bleeding, infection, bowel perforation that could require surgery, sedation-related changes in cardiopulmonary systems), benefits (identification and possible treatment of source of symptoms, exclusion of certain causes of symptoms), and alternatives (watchful waiting, radiographic imaging studies, empiric medical treatment) of colonoscopy were explained to patient/family in detail and patient wishes to  proceed.   BURNETTE ELSIE HERO 02/06/2024, 9:43 AM

## 2024-02-06 NOTE — Op Note (Signed)
 [facility] Patient Name: Lori Butler Procedure Date: 02/06/2024 MRN: [id number] Attending MD: Elsie Cree , MD, [id number] Date of Birth: 12-31-1950 CSN: [id number] Age: 73 Admit Type: Ambulatory Procedure:                Colonoscopy Indications:              Follow-up for history of colon polyps Providers:                Elsie Cree, MD, Randall Lines, RN, Fairy Marina, Technician Referring MD:              Medicines:                Monitored Anesthesia Care Complications:            No immediate complications. Estimated Blood Loss:     Estimated blood loss: none. Procedure:                Pre-Anesthesia Assessment:                           - Prior to the procedure, a History and Physical                            was performed, and patient medications and                            allergies were reviewed. The patient's tolerance of                            previous anesthesia was also reviewed. The risks                            and benefits of the procedure and the sedation                            options and risks were discussed with the patient.                            All questions were answered, and informed consent                            was obtained. Prior Anticoagulants: The patient has                            taken no anticoagulant or antiplatelet agents. ASA                            Grade Assessment: III - A patient with severe                            systemic disease. After reviewing the risks and  benefits, the patient was deemed in satisfactory                            condition to undergo the procedure.                           After obtaining informed consent, the colonoscope                            was passed under direct vision. Throughout the                            procedure, the patient's blood pressure, pulse, and                            oxygen   saturations were monitored continuously. The                            [id number]-HQ190DL ([id number] Olympus colonoscope was                            introduced through the anus and advanced to the the                            cecum, identified by appendiceal orifice and                            ileocecal valve. The ileocecal valve, appendiceal                            orifice, and rectum were photographed. The                            colonoscopy was performed without difficulty. The                            patient tolerated the procedure well. The quality                            of the bowel preparation was fair. Scope In: 10:03:05 AM Scope Out: 10:38:17 AM Scope Withdrawal Time: 0 hours 27 minutes 30 seconds  Total Procedure Duration: 0 hours 35 minutes 12 seconds  Findings:      Hemorrhoids were found on perianal exam.      Internal hemorrhoids were found. The hemorrhoids were mild.      Fair prep again witnessed; diminutive polyps could easily have been       missed.      A few medium-mouthed diverticula were found in the sigmoid colon,       descending colon and transverse colon.      Three sessile polyps were found in the transverse colon and ascending       colon. The polyps were 4 to 6 mm in size. These polyps were removed with       a cold snare. Resection and retrieval were complete.      A 7 mm  polyp was found in the sigmoid colon; residual from prior       polypectomy site.. The polyp was sessile. The polyp was removed with a       hot snare. Resection and retrieval were complete.      Two sessile polyps were found in the sigmoid colon. The polyps were 6 to       7 mm in size. These polyps were removed with a hot snare. Resection and       retrieval were complete.      An 8 mm polyp was found in the rectum. The polyp was sessile. The polyp       was removed with a hot snare. Resection and retrieval were complete.      The exam was otherwise without abnormality.  Rectal vault very small;       unable to retroflex; no distal rectal or anal pathology seen upon slow       withdrawal of colonoscope in prograde views. Impression:               - Preparation of the colon was fair.                           - Hemorrhoids found on perianal exam.                           - Internal hemorrhoids.                           - Diverticulosis in the sigmoid colon, in the                            descending colon and in the transverse colon.                           - Three 4 to 6 mm polyps in the transverse colon                            and in the ascending colon, removed with a cold                            snare. Resected and retrieved.                           - One 7 mm polyp in the sigmoid colon, remnant                            prior polypectomy site, removed with a hot snare.                            Resected and retrieved.                           - Two 6 to 7 mm polyps in the sigmoid colon,                            removed with a hot snare. Resected and retrieved.                           -  One 8 mm polyp in the rectum, removed with a hot                            snare. Resected and retrieved.                           - The examination was otherwise normal.                           - The examination was otherwise normal. Moderate Sedation:      None Recommendation:           - Discharge patient to home (via wheelchair).                           - Soft diet today.                           - Continue present medications.                           - Await pathology results.                           - Repeat colonoscopy (date not yet determined) for                            surveillance based on pathology results.                           - Return to GI clinic at appointment to be                            scheduled.                           - Return to referring physician as previously                             scheduled. Procedure Code(s):        --- Professional ---                           [phone], Colonoscopy, flexible; with removal of                            tumor(s), polyp(s), or other lesion(s) by snare                            technique Diagnosis Code(s):        --- Professional ---                           K64.8, Other hemorrhoids                           D12.3, Benign neoplasm of transverse colon (hepatic  flexure or splenic flexure)                           D12.2, Benign neoplasm of ascending colon                           D12.5, Benign neoplasm of sigmoid colon                           D12.8, Benign neoplasm of rectum                           Z86.010, Personal history of colonic polyps                           K57.30, Diverticulosis of large intestine without                            perforation or abscess without bleeding CPT copyright 2022 American [employer] Association. All rights reserved. The codes documented in this report are preliminary and upon coder review may  be revised to meet current compliance requirements. Elsie Cree, MD 02/06/2024 10:45:48 AM This report has been signed electronically. Number of Addenda: 0

## 2024-02-06 NOTE — Anesthesia Preprocedure Evaluation (Addendum)
 Anesthesia Evaluation  Patient identified by MRN, date of birth, ID band Patient awake    Reviewed: Allergy & Precautions, NPO status , Patient's Chart, lab work & pertinent test results  Airway Mallampati: II  TM Distance: >3 FB Neck ROM: Full    Dental no notable dental hx. (+) Dental Advisory Given, Poor Dentition, Chipped,    Pulmonary shortness of breath, with exertion, at rest and Long-Term Oxygen  Therapy, asthma , pneumonia, resolved, COPD,  COPD inhaler and oxygen  dependent, former smoker Hx/o LUL lung Ca 2020   breath sounds clear to auscultation + decreased breath sounds      Cardiovascular Exercise Tolerance: Good negative cardio ROS Normal cardiovascular exam Rhythm:Regular Rate:Normal     Neuro/Psych  PSYCHIATRIC DISORDERS Anxiety Depression    Hx/o suicide attempt 2019   GI/Hepatic Neg liver ROS,GERD  Medicated and Controlled,,Hx/o colon polyps   Endo/Other  Hyperlipidemia  Renal/GU negative Renal ROS  negative genitourinary   Musculoskeletal  (+) Arthritis , Osteoarthritis,    Abdominal   Peds  Hematology  (+) Blood dyscrasia, anemia   Anesthesia Other Findings   Reproductive/Obstetrics                              Anesthesia Physical Anesthesia Plan  ASA: 2  Anesthesia Plan: MAC   Post-op Pain Management: Minimal or no pain anticipated   Induction: Intravenous  PONV Risk Score and Plan: 2 and Propofol  infusion and Treatment may vary due to age or medical condition  Airway Management Planned: Nasal Cannula and Natural Airway  Additional Equipment: None  Intra-op Plan:   Post-operative Plan:   Informed Consent: I have reviewed the patients History and Physical, chart, labs and discussed the procedure including the risks, benefits and alternatives for the proposed anesthesia with the patient or authorized representative who has indicated his/her understanding and  acceptance.     Dental advisory given  Plan Discussed with: CRNA and Anesthesiologist  Anesthesia Plan Comments:          Anesthesia Quick Evaluation

## 2024-02-06 NOTE — Discharge Instructions (Signed)
 YOU HAD AN ENDOSCOPIC PROCEDURE TODAY: Refer to the procedure report and other information in the discharge instructions given to you for any specific questions about what was found during the examination. If this information does not answer your questions, please call [facility] office at [phone] to clarify.   YOU SHOULD EXPECT: Some feelings of bloating in the abdomen. Passage of more gas than usual. Walking can help get rid of the air that was put into your GI tract during the procedure and reduce the bloating. If you had a lower endoscopy (such as a colonoscopy or flexible sigmoidoscopy) you may notice spotting of blood in your stool or on the toilet paper. Some abdominal soreness may be present for a day or two, also.  DIET: Your first meal following the procedure should be a light meal and then it is ok to progress to your normal diet. A half-sandwich or bowl of soup is an example of a good first meal. Heavy or fried foods are harder to digest and may make you feel nauseous or bloated. Drink plenty of fluids but you should avoid alcoholic beverages for 24 hours. If you had a esophageal dilation, please see attached instructions for diet.   ACTIVITY: Your care partner should take you home directly after the procedure. You should plan to take it easy, moving slowly for the rest of the day. You can resume normal activity the day after the procedure however YOU SHOULD NOT DRIVE, use power tools, machinery or perform tasks that involve climbing or major physical exertion for 24 hours (because of the sedation medicines used during the test).   SYMPTOMS TO REPORT IMMEDIATELY: A gastroenterologist can be reached at any hour. Please call [phone]  for any of the following symptoms:  . Following lower endoscopy (colonoscopy, flexible sigmoidoscopy) Excessive amounts of blood in the stool  Significant tenderness, worsening of abdominal pains  Swelling of the abdomen that is new, acute  Fever of 100  or higher   FOLLOW UP:  If any biopsies were taken you will be contacted by phone or by letter within the next 1-3 weeks. Call [phone]  if you have not heard about the biopsies in 3 weeks.  Please also call with any specific questions about appointments or follow up tests.

## 2024-02-06 NOTE — Transfer of Care (Addendum)
 Immediate Anesthesia Transfer of Care Note  Patient: Lori Butler  Procedure(s) Performed: COLONOSCOPY  Patient Location: Endoscopy Unit  Anesthesia Type:MAC  Level of Consciousness: drowsy and patient cooperative  Airway & Oxygen  Therapy: Patient Spontanous Breathing Nasal cannula.   Post-op Assessment: Report given to RN and Post -op Vital signs reviewed and stable  Post vital signs: Reviewed and stable  Last Vitals:  Vitals Value Taken Time  BP 129/40 02/06/24 10:46  Temp 36.2 C 02/06/24 10:46  Pulse 83 02/06/24 10:47  Resp 23 02/06/24 10:47  SpO2 100 % 02/06/24 10:47  Vitals shown include unfiled device data.  Last Pain:  Vitals:   02/06/24 1046  TempSrc: Temporal  PainSc: 0-No pain         Complications: No notable events documented.

## 2024-02-07 ENCOUNTER — Encounter (HOSPITAL_COMMUNITY): Payer: Self-pay | Admitting: Gastroenterology

## 2024-02-07 LAB — SURGICAL PATHOLOGY

## 2024-02-10 ENCOUNTER — Other Ambulatory Visit (HOSPITAL_BASED_OUTPATIENT_CLINIC_OR_DEPARTMENT_OTHER): Payer: Self-pay | Admitting: Pulmonary Disease

## 2024-04-09 ENCOUNTER — Other Ambulatory Visit: Payer: Self-pay | Admitting: Nurse Practitioner

## 2024-04-09 DIAGNOSIS — J302 Other seasonal allergic rhinitis: Secondary | ICD-10-CM

## 2024-05-16 ENCOUNTER — Ambulatory Visit: Payer: Self-pay | Admitting: Pulmonary Disease

## 2024-05-16 ENCOUNTER — Ambulatory Visit (INDEPENDENT_AMBULATORY_CARE_PROVIDER_SITE_OTHER)

## 2024-05-16 ENCOUNTER — Ambulatory Visit (HOSPITAL_BASED_OUTPATIENT_CLINIC_OR_DEPARTMENT_OTHER): Admitting: Pulmonary Disease

## 2024-05-16 ENCOUNTER — Encounter (HOSPITAL_BASED_OUTPATIENT_CLINIC_OR_DEPARTMENT_OTHER): Payer: Self-pay | Admitting: Pulmonary Disease

## 2024-05-16 ENCOUNTER — Ambulatory Visit (HOSPITAL_BASED_OUTPATIENT_CLINIC_OR_DEPARTMENT_OTHER)
Admission: RE | Admit: 2024-05-16 | Discharge: 2024-05-16 | Disposition: A | Source: Ambulatory Visit | Attending: Pulmonary Disease | Admitting: Pulmonary Disease

## 2024-05-16 VITALS — BP 132/84 | HR 126 | Temp 97.8°F | Ht 64.0 in | Wt 158.0 lb

## 2024-05-16 DIAGNOSIS — R112 Nausea with vomiting, unspecified: Secondary | ICD-10-CM

## 2024-05-16 DIAGNOSIS — R1011 Right upper quadrant pain: Secondary | ICD-10-CM | POA: Insufficient documentation

## 2024-05-16 DIAGNOSIS — J9611 Chronic respiratory failure with hypoxia: Secondary | ICD-10-CM

## 2024-05-16 DIAGNOSIS — J4489 Other specified chronic obstructive pulmonary disease: Secondary | ICD-10-CM | POA: Diagnosis not present

## 2024-05-16 DIAGNOSIS — Z87891 Personal history of nicotine dependence: Secondary | ICD-10-CM

## 2024-05-16 DIAGNOSIS — R911 Solitary pulmonary nodule: Secondary | ICD-10-CM

## 2024-05-16 DIAGNOSIS — J439 Emphysema, unspecified: Secondary | ICD-10-CM | POA: Diagnosis not present

## 2024-05-16 NOTE — Patient Instructions (Signed)
" °  VISIT SUMMARY: During your visit, we discussed your recent symptoms of nausea, vomiting, and abdominal pain. We also reviewed your ongoing conditions, including COPD, chronic respiratory failure, and lung cancer.  YOUR PLAN: -RIGHT UPPER QUADRANT ABDOMINAL PAIN WITH NAUSEA AND VOMITING: You have been experiencing acute right upper quadrant abdominal pain with nausea and vomiting for the past 2-4 days. This pain radiates to your shoulder. We are considering possible gallbladder or liver issues. We have ordered a chest x-ray to check for any lung-related causes, an ultrasound of your gallbladder to look for gallstones or other issues, and blood work to check for infection and liver function.  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 Your oxygen  levels are stable, and you are using oxygen  as needed. Continue using your albuterol  inhaler as needed.  -CHRONIC RESPIRATORY FAILURE WITH HYPOXIA: Chronic respiratory failure with hypoxia means your body is not getting enough oxygen . This is managed with supplemental oxygen , and your oxygen  levels are currently stable. Continue using your supplemental oxygen  therapy.  INSTRUCTIONS: Please follow up with the chest x-ray, ultrasound, and blood work as ordered. Continue using your albuterol  inhaler and supplemental oxygen  as needed. If your symptoms worsen or you experience new symptoms, please contact our office immediately.                      Contains text generated by [employer]bridge.                                 Contains text generated by [employer]bridge.   "

## 2024-05-16 NOTE — Progress Notes (Signed)
 Pt.notified

## 2024-05-16 NOTE — Progress Notes (Signed)
 "  Subjective:    Patient ID: Lori Butler, female    DOB: Nov 17, 1950, 74 y.o.   MRN: [id number]   74  yo ex- smoker for FU of moderate COPD/ asthma, chronic resp failure with hypoxia & lung cancer  -on O2 with activity  She quit smoking 09/2017 Sudden onset and variability suggests a component of asthma   She had 2 hospitalizations 12/2017 for acute hypercarbic respiratory failure with change in mental status.  Each time she improved quickly and did not require oxygen  on discharge   She underwent navigation biopsy of left upper lobe nodule showing atypical cells and SBRT 12/2018      Discussed the use of AI scribe software for clinical note transcription with the patient, who gave verbal consent to proceed.  History of Present Illness Teriana Butler is a 74 year old female with COPD, chronic respiratory failure, hypoxia, and lung cancer who presents with nausea and vomiting.  She has had 2 to 4 days of nausea and vomiting with abdominal pain, poor appetite, and no oral intake since yesterday. She notes heart rate up to 145-150 bpm when standing that resolves with sitting and right-sided pain radiating to the shoulder. She denies diarrhea or known fever.  She has COPD, chronic respiratory failure with hypoxia, lung cancer, uses home oxygen , and has an albuterol  inhaler. Her last cancer scan was in May of the previous year. She is on medication for gallstones and has occasional mild cough. She also takes Fosamax  before breakfast.        Past Medical History:  Diagnosis Date   Acute respiratory failure with hypoxia (HCC)    Anxiety    Anxiety and depression 05/05/2018   Aortic atherosclerosis    Arthritis    a little in my hands probably (10/31/2017)   Asthma    Attempted suicide (HCC) 02/21/2018   Cellulitis of right leg 02/28/2019   Chronic respiratory failure with hypoxia (HCC) 03/05/2020   COPD (chronic obstructive pulmonary disease) (HCC)    COPD  exacerbation (HCC)    COPD with asthma (HCC) 09/15/2017   FEV1 53%  Clinically seems to have a component of asthma  - alpha one AT   03/01/2019  =  MM level   196   Depression    History of kidney stones    lung ca dx'd 10/2018   Pneumonia due to COVID-19 virus 02/04/2020   Seasonal allergic rhinitis 08/15/2021   Solitary pulmonary nodule on lung CT 10/22/2018   Suicide attempt (HCC) 02/21/2018   overdosed on Wellbutrin    Tobacco abuse 09/20/2017   Vitamin B12 deficiency 11/10/2017   Vitamin D  deficiency 11/10/2017     Significant tests/ events reviewed     CT chest 09/2023 >> no change LUL scarring CT chest w con 08/2022 >>  unchanged LUL scarring     CT chest 10/14/2018 -bilateral upper lobe parenchymal scarring on her prior CT and left upper lobe scar that appeared more nodular on the CT measuring about 13 mm PET scan showed hypermetabolism with SUV 8 in this nodule     PFTs 10/2018 showed moderate reversible airway obstruction with FEV1 of 37%-0.91 which improved to 47%-1.1L. DLCO was about 54%     PFTs 10/2017 -moderate airway obstruction, ratio 59, FEV1 51%, FVC 65%, no significant BD response, DLCO 57%  Review of Systems  neg for any significant sore throat, dysphagia, itching, sneezing, nasal congestion or excess/ purulent secretions, fever, chills, sweats, unintended wt loss, pleuritic  or exertional cp, hempoptysis, orthopnea pnd or change in chronic leg swelling. Also denies presyncope, palpitations, heartburn, abdominal pain, nausea, vomiting, diarrhea or change in bowel or urinary habits, dysuria,hematuria, rash, arthralgias, visual complaints, headache, numbness weakness or ataxia.      Objective:   Physical Exam  Gen. Pleasant, well-nourished, elderly ,in no distress ENT - no thrush, no pallor/icterus Neck: No JVD, no thyromegaly, no carotid bruits Lungs: no use of accessory muscles, no dullness to percussion, decreased BL without rales or rhonchi   Cardiovascular: Rhythm regular, heart sounds  normal, no murmurs or gallops, no peripheral edema Abd - no tenderness, no guarding Musculoskeletal: No deformities, no cyanosis or clubbing        Assessment & Plan:   Assessment and Plan Assessment & Plan Right upper quadrant abdominal pain with nausea and vomiting Acute right upper quadrant abdominal pain with associated nausea and vomiting for 2-4 days. Pain radiates to the shoulder. Differential diagnosis includes gallbladder pathology or liver issues. No fever reported. No prior history of similar symptoms. 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 Oxygen  levels are stable, and she is using oxygen  as needed. Albuterol  is available for use. - Continue trelegy + albuterol  as needed  Chronic respiratory failure with hypoxia Chronic respiratory failure with hypoxia is managed with supplemental oxygen . Oxygen  levels are currently stable. - Continue supplemental oxygen  therapy  Pulm nodule - s/p SBRT Rpt CT in may 2026     "

## 2024-05-17 LAB — CBC WITH DIFFERENTIAL/PLATELET
Basophils Absolute: 0.1 x10E3/uL (ref 0.0–0.2)
Basos: 1 %
EOS (ABSOLUTE): 0.2 x10E3/uL (ref 0.0–0.4)
Eos: 2 %
Hematocrit: 40.5 % (ref 34.0–46.6)
Hemoglobin: 12.5 g/dL (ref 11.1–15.9)
Immature Grans (Abs): 0 x10E3/uL (ref 0.0–0.1)
Immature Granulocytes: 0 %
Lymphocytes Absolute: 2 x10E3/uL (ref 0.7–3.1)
Lymphs: 20 %
MCH: 28.8 pg (ref 26.6–33.0)
MCHC: 30.9 g/dL — ABNORMAL LOW (ref 31.5–35.7)
MCV: 93 fL (ref 79–97)
Monocytes Absolute: 0.6 x10E3/uL (ref 0.1–0.9)
Monocytes: 6 %
Neutrophils Absolute: 7 x10E3/uL (ref 1.4–7.0)
Neutrophils: 71 %
Platelets: 257 x10E3/uL (ref 150–450)
RBC: 4.34 x10E6/uL (ref 3.77–5.28)
RDW: 12.6 % (ref 11.7–15.4)
WBC: 9.9 x10E3/uL (ref 3.4–10.8)

## 2024-05-17 LAB — COMPREHENSIVE METABOLIC PANEL WITH GFR
ALT: 18 IU/L (ref 0–32)
AST: 24 IU/L (ref 0–40)
Albumin: 4.5 g/dL (ref 3.8–4.8)
Alkaline Phosphatase: 211 IU/L — ABNORMAL HIGH (ref 49–135)
BUN/Creatinine Ratio: 26 (ref 12–28)
BUN: 17 mg/dL (ref 8–27)
Bilirubin Total: 0.5 mg/dL (ref 0.0–1.2)
CO2: 23 mmol/L (ref 20–29)
Calcium: 10.2 mg/dL (ref 8.7–10.3)
Chloride: 100 mmol/L (ref 96–106)
Creatinine, Ser: 0.65 mg/dL (ref 0.57–1.00)
Globulin, Total: 3.4 g/dL (ref 1.5–4.5)
Glucose: 93 mg/dL (ref 70–99)
Potassium: 4.6 mmol/L (ref 3.5–5.2)
Sodium: 139 mmol/L (ref 134–144)
Total Protein: 7.9 g/dL (ref 6.0–8.5)
eGFR: 92 mL/min/1.73

## 2024-09-08 ENCOUNTER — Ambulatory Visit (HOSPITAL_BASED_OUTPATIENT_CLINIC_OR_DEPARTMENT_OTHER)

## 2024-09-22 ENCOUNTER — Ambulatory Visit: Admitting: Radiation Oncology
